# Patient Record
Sex: Male | Born: 1945 | Race: White | Hispanic: No | Marital: Married | Smoking: Former smoker
Health system: Southern US, Community
[De-identification: ages and names within clinical notes are randomized; demographics above are authoritative.]

## PROBLEM LIST (undated history)

## (undated) DIAGNOSIS — I10 Essential (primary) hypertension: Secondary | ICD-10-CM

## (undated) DIAGNOSIS — Z95828 Presence of other vascular implants and grafts: Secondary | ICD-10-CM

## (undated) DIAGNOSIS — G629 Polyneuropathy, unspecified: Secondary | ICD-10-CM

## (undated) DIAGNOSIS — E119 Type 2 diabetes mellitus without complications: Secondary | ICD-10-CM

## (undated) DIAGNOSIS — I712 Thoracic aortic aneurysm, without rupture, unspecified: Secondary | ICD-10-CM

## (undated) DIAGNOSIS — N182 Chronic kidney disease, stage 2 (mild): Secondary | ICD-10-CM

## (undated) DIAGNOSIS — C801 Malignant (primary) neoplasm, unspecified: Secondary | ICD-10-CM

## (undated) DIAGNOSIS — E782 Mixed hyperlipidemia: Secondary | ICD-10-CM

## (undated) DIAGNOSIS — I4891 Unspecified atrial fibrillation: Secondary | ICD-10-CM

## (undated) DIAGNOSIS — I482 Chronic atrial fibrillation, unspecified: Secondary | ICD-10-CM

## (undated) DIAGNOSIS — C189 Malignant neoplasm of colon, unspecified: Secondary | ICD-10-CM

## (undated) DIAGNOSIS — Z7901 Long term (current) use of anticoagulants: Secondary | ICD-10-CM

## (undated) DIAGNOSIS — C649 Malignant neoplasm of unspecified kidney, except renal pelvis: Secondary | ICD-10-CM

## (undated) HISTORY — DX: Chronic atrial fibrillation, unspecified: I48.20

## (undated) HISTORY — DX: Mixed hyperlipidemia: E78.2

## (undated) HISTORY — DX: Presence of other vascular implants and grafts: Z95.828

## (undated) HISTORY — PX: NEPHRECTOMY: SHX65

## (undated) HISTORY — DX: Unspecified atrial fibrillation: I48.91

## (undated) HISTORY — PX: COLOSTOMY: SHX63

## (undated) HISTORY — DX: Chronic kidney disease, stage 2 (mild): N18.2

## (undated) HISTORY — DX: Essential (primary) hypertension: I10

## (undated) HISTORY — DX: Malignant neoplasm of colon, unspecified: C18.9

## (undated) HISTORY — DX: Polyneuropathy, unspecified: G62.9

## (undated) HISTORY — DX: Malignant neoplasm of unspecified kidney, except renal pelvis: C64.9

## (undated) HISTORY — DX: Type 2 diabetes mellitus without complications: E11.9

## (undated) HISTORY — PX: COLECTOMY: SHX59

---

## 2004-10-17 ENCOUNTER — Ambulatory Visit (HOSPITAL_COMMUNITY): Admission: RE | Admit: 2004-10-17 | Discharge: 2004-10-17 | Payer: Self-pay | Admitting: Family Medicine

## 2008-09-30 DIAGNOSIS — C649 Malignant neoplasm of unspecified kidney, except renal pelvis: Secondary | ICD-10-CM

## 2008-09-30 DIAGNOSIS — C189 Malignant neoplasm of colon, unspecified: Secondary | ICD-10-CM

## 2008-09-30 HISTORY — DX: Malignant neoplasm of unspecified kidney, except renal pelvis: C64.9

## 2008-09-30 HISTORY — DX: Malignant neoplasm of colon, unspecified: C18.9

## 2009-06-26 ENCOUNTER — Encounter (INDEPENDENT_AMBULATORY_CARE_PROVIDER_SITE_OTHER): Payer: Self-pay | Admitting: General Surgery

## 2009-06-26 ENCOUNTER — Inpatient Hospital Stay (HOSPITAL_COMMUNITY): Admission: RE | Admit: 2009-06-26 | Discharge: 2009-07-06 | Payer: Self-pay | Admitting: General Surgery

## 2009-06-26 ENCOUNTER — Encounter (INDEPENDENT_AMBULATORY_CARE_PROVIDER_SITE_OTHER): Payer: Self-pay | Admitting: Cardiovascular Disease

## 2009-06-28 ENCOUNTER — Encounter (INDEPENDENT_AMBULATORY_CARE_PROVIDER_SITE_OTHER): Payer: Self-pay | Admitting: Urology

## 2009-08-07 ENCOUNTER — Inpatient Hospital Stay (HOSPITAL_COMMUNITY): Admission: AD | Admit: 2009-08-07 | Discharge: 2009-08-11 | Payer: Self-pay | Admitting: Family Medicine

## 2009-08-07 ENCOUNTER — Ambulatory Visit (HOSPITAL_COMMUNITY): Admission: RE | Admit: 2009-08-07 | Discharge: 2009-08-07 | Payer: Self-pay | Admitting: Family Medicine

## 2009-08-09 ENCOUNTER — Encounter: Payer: Self-pay | Admitting: General Surgery

## 2009-09-11 ENCOUNTER — Ambulatory Visit (HOSPITAL_COMMUNITY): Admission: RE | Admit: 2009-09-11 | Discharge: 2009-09-11 | Payer: Self-pay | Admitting: Family Medicine

## 2009-10-03 ENCOUNTER — Encounter (HOSPITAL_COMMUNITY): Admission: RE | Admit: 2009-10-03 | Discharge: 2009-11-02 | Payer: Self-pay | Admitting: Oncology

## 2009-10-03 ENCOUNTER — Ambulatory Visit (HOSPITAL_COMMUNITY): Payer: Self-pay | Admitting: Oncology

## 2009-10-13 ENCOUNTER — Ambulatory Visit (HOSPITAL_COMMUNITY): Admission: RE | Admit: 2009-10-13 | Discharge: 2009-10-13 | Payer: Self-pay | Admitting: General Surgery

## 2009-11-08 ENCOUNTER — Encounter (HOSPITAL_COMMUNITY): Admission: RE | Admit: 2009-11-08 | Discharge: 2009-12-08 | Payer: Self-pay | Admitting: Oncology

## 2009-11-11 ENCOUNTER — Inpatient Hospital Stay (HOSPITAL_COMMUNITY): Admission: EM | Admit: 2009-11-11 | Discharge: 2009-11-13 | Payer: Self-pay | Admitting: Emergency Medicine

## 2009-11-22 ENCOUNTER — Ambulatory Visit (HOSPITAL_COMMUNITY): Payer: Self-pay | Admitting: Oncology

## 2009-12-12 ENCOUNTER — Encounter (HOSPITAL_COMMUNITY): Admission: RE | Admit: 2009-12-12 | Discharge: 2010-01-11 | Payer: Self-pay | Admitting: Oncology

## 2010-01-09 ENCOUNTER — Ambulatory Visit (HOSPITAL_COMMUNITY): Payer: Self-pay | Admitting: Oncology

## 2010-01-17 ENCOUNTER — Encounter (HOSPITAL_COMMUNITY): Admission: RE | Admit: 2010-01-17 | Discharge: 2010-02-16 | Payer: Self-pay | Admitting: Oncology

## 2010-02-20 ENCOUNTER — Encounter (HOSPITAL_COMMUNITY): Admission: RE | Admit: 2010-02-20 | Discharge: 2010-03-22 | Payer: Self-pay | Admitting: Oncology

## 2010-02-27 ENCOUNTER — Ambulatory Visit (HOSPITAL_COMMUNITY): Payer: Self-pay | Admitting: Oncology

## 2010-04-10 ENCOUNTER — Encounter (HOSPITAL_COMMUNITY): Admission: RE | Admit: 2010-04-10 | Discharge: 2010-05-10 | Payer: Self-pay | Admitting: Oncology

## 2010-05-03 ENCOUNTER — Ambulatory Visit (HOSPITAL_COMMUNITY): Payer: Self-pay | Admitting: Oncology

## 2010-06-15 ENCOUNTER — Encounter (HOSPITAL_COMMUNITY): Admission: RE | Admit: 2010-06-15 | Discharge: 2010-06-29 | Payer: Self-pay | Admitting: Oncology

## 2010-07-03 ENCOUNTER — Ambulatory Visit (HOSPITAL_COMMUNITY): Payer: Self-pay | Admitting: Oncology

## 2010-08-13 ENCOUNTER — Ambulatory Visit (HOSPITAL_COMMUNITY)
Admission: RE | Admit: 2010-08-13 | Discharge: 2010-08-13 | Payer: Self-pay | Source: Home / Self Care | Admitting: General Surgery

## 2010-09-07 ENCOUNTER — Encounter (HOSPITAL_COMMUNITY)
Admission: RE | Admit: 2010-09-07 | Discharge: 2010-10-07 | Payer: Self-pay | Source: Home / Self Care | Attending: Oncology | Admitting: Oncology

## 2010-09-07 ENCOUNTER — Ambulatory Visit (HOSPITAL_COMMUNITY): Payer: Self-pay | Admitting: Oncology

## 2010-10-18 ENCOUNTER — Encounter (HOSPITAL_COMMUNITY)
Admission: RE | Admit: 2010-10-18 | Discharge: 2010-10-30 | Payer: Self-pay | Source: Home / Self Care | Attending: Oncology | Admitting: Oncology

## 2010-10-22 LAB — PROTIME-INR: Prothrombin Time: 17.8 seconds — ABNORMAL HIGH (ref 11.6–15.2)

## 2010-11-29 ENCOUNTER — Encounter (HOSPITAL_COMMUNITY): Payer: BC Managed Care – PPO | Attending: Oncology

## 2010-11-29 ENCOUNTER — Other Ambulatory Visit (HOSPITAL_COMMUNITY): Payer: BC Managed Care – PPO

## 2010-11-29 DIAGNOSIS — C189 Malignant neoplasm of colon, unspecified: Secondary | ICD-10-CM

## 2010-11-29 DIAGNOSIS — Z79899 Other long term (current) drug therapy: Secondary | ICD-10-CM | POA: Insufficient documentation

## 2010-11-29 DIAGNOSIS — E119 Type 2 diabetes mellitus without complications: Secondary | ICD-10-CM | POA: Insufficient documentation

## 2010-11-29 DIAGNOSIS — Z85038 Personal history of other malignant neoplasm of large intestine: Secondary | ICD-10-CM | POA: Insufficient documentation

## 2010-11-29 DIAGNOSIS — I4891 Unspecified atrial fibrillation: Secondary | ICD-10-CM | POA: Insufficient documentation

## 2010-11-29 DIAGNOSIS — Z09 Encounter for follow-up examination after completed treatment for conditions other than malignant neoplasm: Secondary | ICD-10-CM | POA: Insufficient documentation

## 2010-11-29 DIAGNOSIS — Z85528 Personal history of other malignant neoplasm of kidney: Secondary | ICD-10-CM | POA: Insufficient documentation

## 2010-12-11 LAB — GLUCOSE, CAPILLARY: Glucose-Capillary: 132 mg/dL — ABNORMAL HIGH (ref 70–99)

## 2010-12-13 LAB — DIFFERENTIAL
Basophils Relative: 1 % (ref 0–1)
Lymphocytes Relative: 18 % (ref 12–46)
Lymphs Abs: 1.1 10*3/uL (ref 0.7–4.0)
Monocytes Relative: 13 % — ABNORMAL HIGH (ref 3–12)
Neutro Abs: 4.2 10*3/uL (ref 1.7–7.7)
Neutrophils Relative %: 67 % (ref 43–77)

## 2010-12-13 LAB — CBC
HCT: 37.3 % — ABNORMAL LOW (ref 39.0–52.0)
MCH: 32.7 pg (ref 26.0–34.0)
Platelets: 200 10*3/uL (ref 150–400)
WBC: 6.3 10*3/uL (ref 4.0–10.5)

## 2010-12-13 LAB — COMPREHENSIVE METABOLIC PANEL
Albumin: 3.8 g/dL (ref 3.5–5.2)
Alkaline Phosphatase: 61 U/L (ref 39–117)
CO2: 28 mEq/L (ref 19–32)
Calcium: 9.4 mg/dL (ref 8.4–10.5)
Chloride: 105 mEq/L (ref 96–112)
Creatinine, Ser: 1.38 mg/dL (ref 0.4–1.5)
GFR calc non Af Amer: 52 mL/min — ABNORMAL LOW (ref >60)
Glucose, Bld: 101 mg/dL — ABNORMAL HIGH (ref 70–99)

## 2010-12-14 LAB — PROTIME-INR
INR: 1.78 — ABNORMAL HIGH (ref 0.00–1.49)
Prothrombin Time: 20.9 seconds — ABNORMAL HIGH (ref 11.6–15.2)

## 2010-12-16 LAB — BASIC METABOLIC PANEL
CO2: 25 mEq/L (ref 19–32)
Calcium: 9.1 mg/dL (ref 8.4–10.5)
Creatinine, Ser: 1.52 mg/dL — ABNORMAL HIGH (ref 0.4–1.5)
GFR calc Af Amer: 57 mL/min — ABNORMAL LOW (ref >60)
GFR calc non Af Amer: 47 mL/min — ABNORMAL LOW (ref >60)

## 2010-12-16 LAB — DIFFERENTIAL
Basophils Absolute: 0 10*3/uL (ref 0.0–0.1)
Basophils Relative: 1 % (ref 0–1)
Eosinophils Absolute: 0.1 10*3/uL (ref 0.0–0.7)
Eosinophils Absolute: 0.1 10*3/uL (ref 0.0–0.7)
Eosinophils Absolute: 0 10*3/uL (ref 0.0–0.7)
Eosinophils Relative: 1 % (ref 0–5)
Lymphocytes Relative: 13 % (ref 12–46)
Lymphocytes Relative: 20 % (ref 12–46)
Lymphs Abs: 0.8 10*3/uL (ref 0.7–4.0)
Lymphs Abs: 0.9 10*3/uL (ref 0.7–4.0)
Monocytes Absolute: 0.8 10*3/uL (ref 0.1–1.0)
Monocytes Absolute: 1.1 10*3/uL — ABNORMAL HIGH (ref 0.1–1.0)
Monocytes Relative: 24 % — ABNORMAL HIGH (ref 3–12)
Neutro Abs: 3.7 10*3/uL (ref 1.7–7.7)
Neutrophils Relative %: 71 % (ref 43–77)

## 2010-12-16 LAB — CBC
HCT: 31.3 % — ABNORMAL LOW (ref 39.0–52.0)
Hemoglobin: 10.4 g/dL — ABNORMAL LOW (ref 13.0–17.0)
Hemoglobin: 11.1 g/dL — ABNORMAL LOW (ref 13.0–17.0)
Hemoglobin: 12.1 g/dL — ABNORMAL LOW (ref 13.0–17.0)
MCHC: 32.4 g/dL (ref 30.0–36.0)
MCHC: 33 g/dL (ref 30.0–36.0)
MCHC: 33.3 g/dL (ref 30.0–36.0)
Platelets: 112 10*3/uL — ABNORMAL LOW (ref 150–400)
RBC: 3.85 MIL/uL — ABNORMAL LOW (ref 4.22–5.81)
RDW: 19.4 % — ABNORMAL HIGH (ref 11.5–15.5)
RDW: 18.4 % — ABNORMAL HIGH (ref 11.5–15.5)
RDW: 19.5 % — ABNORMAL HIGH (ref 11.5–15.5)
RDW: 20 % — ABNORMAL HIGH (ref 11.5–15.5)

## 2010-12-16 LAB — COMPREHENSIVE METABOLIC PANEL
ALT: 24 U/L (ref 0–53)
AST: 20 U/L (ref 0–37)
Albumin: 3.7 g/dL (ref 3.5–5.2)
CO2: 26 mEq/L (ref 19–32)
CO2: 27 mEq/L (ref 19–32)
Calcium: 8.9 mg/dL (ref 8.4–10.5)
Calcium: 9.4 mg/dL (ref 8.4–10.5)
Creatinine, Ser: 1.49 mg/dL (ref 0.4–1.5)
GFR calc Af Amer: 53 mL/min — ABNORMAL LOW (ref >60)
GFR calc non Af Amer: 48 mL/min — ABNORMAL LOW (ref >60)
Glucose, Bld: 178 mg/dL — ABNORMAL HIGH (ref 70–99)
Sodium: 137 mEq/L (ref 135–145)
Total Protein: 6.6 g/dL (ref 6.0–8.3)

## 2010-12-16 LAB — PROTIME-INR
INR: 1.17 (ref 0.00–1.49)
INR: 1.65 — ABNORMAL HIGH (ref 0.00–1.49)
INR: 1.32 (ref 0.00–1.49)
INR: 1.87 — ABNORMAL HIGH (ref 0.00–1.49)
Prothrombin Time: 15.6 seconds — ABNORMAL HIGH (ref 11.6–15.2)
Prothrombin Time: 19.4 seconds — ABNORMAL HIGH (ref 11.6–15.2)
Prothrombin Time: 16.3 seconds — ABNORMAL HIGH (ref 11.6–15.2)
Prothrombin Time: 28.9 seconds — ABNORMAL HIGH (ref 11.6–15.2)

## 2010-12-16 LAB — CEA: CEA: 2.6 ng/mL (ref 0.0–5.0)

## 2010-12-17 LAB — CBC
HCT: 34.1 % — ABNORMAL LOW (ref 39.0–52.0)
HCT: 34.8 % — ABNORMAL LOW (ref 39.0–52.0)
Hemoglobin: 11.6 g/dL — ABNORMAL LOW (ref 13.0–17.0)
MCHC: 34 g/dL (ref 30.0–36.0)
Platelets: 135 10*3/uL — ABNORMAL LOW (ref 150–400)
RBC: 3.35 MIL/uL — ABNORMAL LOW (ref 4.22–5.81)
RDW: 21.7 % — ABNORMAL HIGH (ref 11.5–15.5)
RDW: 25.2 % — ABNORMAL HIGH (ref 11.5–15.5)
WBC: 4 10*3/uL (ref 4.0–10.5)

## 2010-12-17 LAB — COMPREHENSIVE METABOLIC PANEL
ALT: 19 U/L (ref 0–53)
ALT: 22 U/L (ref 0–53)
AST: 16 U/L (ref 0–37)
Albumin: 3.4 g/dL — ABNORMAL LOW (ref 3.5–5.2)
Alkaline Phosphatase: 73 U/L (ref 39–117)
Alkaline Phosphatase: 80 U/L (ref 39–117)
BUN: 14 mg/dL (ref 6–23)
CO2: 27 mEq/L (ref 19–32)
Calcium: 9.4 mg/dL (ref 8.4–10.5)
Chloride: 102 mEq/L (ref 96–112)
GFR calc Af Amer: 53 mL/min — ABNORMAL LOW (ref >60)
GFR calc non Af Amer: 44 mL/min — ABNORMAL LOW (ref >60)
Glucose, Bld: 215 mg/dL — ABNORMAL HIGH (ref 70–99)
Potassium: 4.7 mEq/L (ref 3.5–5.1)
Sodium: 136 mEq/L (ref 135–145)
Total Bilirubin: 0.7 mg/dL (ref 0.3–1.2)
Total Protein: 6.1 g/dL (ref 6.0–8.3)
Total Protein: 6.3 g/dL (ref 6.0–8.3)

## 2010-12-17 LAB — LIPID PANEL
Cholesterol: 142 mg/dL (ref 0–200)
HDL: 43 mg/dL (ref >39)
LDL Cholesterol: 58 mg/dL (ref 0–99)
Total CHOL/HDL Ratio: 3.3 RATIO

## 2010-12-17 LAB — DIFFERENTIAL
Basophils Absolute: 0 10*3/uL (ref 0.0–0.1)
Basophils Relative: 1 % (ref 0–1)
Basophils Relative: 1 % (ref 0–1)
Eosinophils Absolute: 0 10*3/uL (ref 0.0–0.7)
Eosinophils Relative: 1 % (ref 0–5)
Eosinophils Relative: 1 % (ref 0–5)
Lymphocytes Relative: 25 % (ref 12–46)
Monocytes Absolute: 0.9 10*3/uL (ref 0.1–1.0)
Neutro Abs: 1.9 10*3/uL (ref 1.7–7.7)

## 2010-12-17 LAB — PROTIME-INR: INR: 1.95 — ABNORMAL HIGH (ref 0.00–1.49)

## 2010-12-17 LAB — HEMOGLOBIN A1C: Hgb A1c MFr Bld: 8.3 % — ABNORMAL HIGH (ref <5.7)

## 2010-12-18 LAB — COMPREHENSIVE METABOLIC PANEL
ALT: 20 U/L (ref 0–53)
ALT: 23 U/L (ref 0–53)
Alkaline Phosphatase: 86 U/L (ref 39–117)
BUN: 23 mg/dL (ref 6–23)
CO2: 28 mEq/L (ref 19–32)
CO2: 28 mEq/L (ref 19–32)
Calcium: 9 mg/dL (ref 8.4–10.5)
Chloride: 102 mEq/L (ref 96–112)
Creatinine, Ser: 1.55 mg/dL — ABNORMAL HIGH (ref 0.4–1.5)
GFR calc Af Amer: 55 mL/min — ABNORMAL LOW (ref >60)
GFR calc non Af Amer: 46 mL/min — ABNORMAL LOW (ref >60)
Glucose, Bld: 193 mg/dL — ABNORMAL HIGH (ref 70–99)
Glucose, Bld: 149 mg/dL — ABNORMAL HIGH (ref 70–99)
Potassium: 5.2 mEq/L — ABNORMAL HIGH (ref 3.5–5.1)
Sodium: 137 mEq/L (ref 135–145)
Sodium: 138 mEq/L (ref 135–145)
Total Bilirubin: 0.5 mg/dL (ref 0.3–1.2)
Total Protein: 6.4 g/dL (ref 6.0–8.3)
Total Protein: 6.8 g/dL (ref 6.0–8.3)

## 2010-12-18 LAB — CBC
HCT: 36.4 % — ABNORMAL LOW (ref 39.0–52.0)
Hemoglobin: 11.9 g/dL — ABNORMAL LOW (ref 13.0–17.0)
Hemoglobin: 12.6 g/dL — ABNORMAL LOW (ref 13.0–17.0)
MCHC: 35.2 g/dL (ref 30.0–36.0)
MCV: 93 fL (ref 78.0–100.0)
RBC: 3.62 MIL/uL — ABNORMAL LOW (ref 4.22–5.81)
RBC: 4.11 MIL/uL — ABNORMAL LOW (ref 4.22–5.81)
RDW: 26.2 % — ABNORMAL HIGH (ref 11.5–15.5)
RDW: 23.1 % — ABNORMAL HIGH (ref 11.5–15.5)
WBC: 6 10*3/uL (ref 4.0–10.5)

## 2010-12-18 LAB — PROTIME-INR
INR: 1.53 — ABNORMAL HIGH (ref 0.00–1.49)
INR: 2.15 — ABNORMAL HIGH (ref 0.00–1.49)
Prothrombin Time: 18.3 seconds — ABNORMAL HIGH (ref 11.6–15.2)

## 2010-12-18 LAB — DIFFERENTIAL
Basophils Absolute: 0 10*3/uL (ref 0.0–0.1)
Basophils Relative: 1 % (ref 0–1)
Eosinophils Absolute: 0.1 10*3/uL (ref 0.0–0.7)
Lymphocytes Relative: 21 % (ref 12–46)
Lymphs Abs: 1 10*3/uL (ref 0.7–4.0)
Monocytes Relative: 21 % — ABNORMAL HIGH (ref 3–12)
Monocytes Relative: 16 % — ABNORMAL HIGH (ref 3–12)
Neutrophils Relative %: 56 % (ref 43–77)
Neutrophils Relative %: 57 % (ref 43–77)

## 2010-12-19 LAB — CBC
HCT: 38 % — ABNORMAL LOW (ref 39.0–52.0)
Hemoglobin: 11.9 g/dL — ABNORMAL LOW (ref 13.0–17.0)
Hemoglobin: 12.6 g/dL — ABNORMAL LOW (ref 13.0–17.0)
MCHC: 34.9 g/dL (ref 30.0–36.0)
MCHC: 33.9 g/dL (ref 30.0–36.0)
MCV: 86.5 fL (ref 78.0–100.0)
MCV: 83 fL (ref 78.0–100.0)
Platelets: 155 10*3/uL (ref 150–400)
Platelets: 252 10*3/uL (ref 150–400)
RBC: 3.95 MIL/uL — ABNORMAL LOW (ref 4.22–5.81)
RDW: 22 % — ABNORMAL HIGH (ref 11.5–15.5)
RDW: 17.4 % — ABNORMAL HIGH (ref 11.5–15.5)
RDW: 18.3 % — ABNORMAL HIGH (ref 11.5–15.5)
WBC: 4.9 10*3/uL (ref 4.0–10.5)
WBC: 8.2 10*3/uL (ref 4.0–10.5)

## 2010-12-19 LAB — DIFFERENTIAL
Basophils Absolute: 0 10*3/uL (ref 0.0–0.1)
Basophils Absolute: 0 10*3/uL (ref 0.0–0.1)
Basophils Relative: 0 % (ref 0–1)
Eosinophils Absolute: 0.1 10*3/uL (ref 0.0–0.7)
Eosinophils Relative: 2 % (ref 0–5)
Lymphocytes Relative: 19 % (ref 12–46)
Lymphocytes Relative: 11 % — ABNORMAL LOW (ref 12–46)
Lymphocytes Relative: 18 % (ref 12–46)
Lymphs Abs: 0.9 10*3/uL (ref 0.7–4.0)
Monocytes Absolute: 0.6 10*3/uL (ref 0.1–1.0)
Monocytes Absolute: 0.9 10*3/uL (ref 0.1–1.0)
Monocytes Relative: 6 % (ref 3–12)
Neutro Abs: 3.2 10*3/uL (ref 1.7–7.7)
Neutro Abs: 3 10*3/uL (ref 1.7–7.7)
Neutro Abs: 5.6 10*3/uL (ref 1.7–7.7)
Neutro Abs: 8.7 10*3/uL — ABNORMAL HIGH (ref 1.7–7.7)
Neutrophils Relative %: 68 % (ref 43–77)

## 2010-12-19 LAB — COMPREHENSIVE METABOLIC PANEL
AST: 11 U/L (ref 0–37)
Albumin: 3.2 g/dL — ABNORMAL LOW (ref 3.5–5.2)
Albumin: 3.5 g/dL (ref 3.5–5.2)
Albumin: 4 g/dL (ref 3.5–5.2)
Alkaline Phosphatase: 59 U/L (ref 39–117)
Alkaline Phosphatase: 70 U/L (ref 39–117)
BUN: 20 mg/dL (ref 6–23)
BUN: 24 mg/dL — ABNORMAL HIGH (ref 6–23)
BUN: 27 mg/dL — ABNORMAL HIGH (ref 6–23)
CO2: 28 mEq/L (ref 19–32)
Calcium: 9.5 mg/dL (ref 8.4–10.5)
Chloride: 104 mEq/L (ref 96–112)
Chloride: 102 mEq/L (ref 96–112)
Creatinine, Ser: 1.75 mg/dL — ABNORMAL HIGH (ref 0.4–1.5)
Creatinine, Ser: 1.71 mg/dL — ABNORMAL HIGH (ref 0.4–1.5)
GFR calc Af Amer: 49 mL/min — ABNORMAL LOW (ref >60)
GFR calc non Af Amer: 40 mL/min — ABNORMAL LOW (ref >60)
Glucose, Bld: 135 mg/dL — ABNORMAL HIGH (ref 70–99)
Potassium: 4.4 mEq/L (ref 3.5–5.1)
Potassium: 4.9 mEq/L (ref 3.5–5.1)
Sodium: 136 mEq/L (ref 135–145)
Total Bilirubin: 0.6 mg/dL (ref 0.3–1.2)
Total Bilirubin: 0.4 mg/dL (ref 0.3–1.2)
Total Protein: 7 g/dL (ref 6.0–8.3)
Total Protein: 7.6 g/dL (ref 6.0–8.3)

## 2010-12-19 LAB — GLUCOSE, CAPILLARY
Glucose-Capillary: 105 mg/dL — ABNORMAL HIGH (ref 70–99)
Glucose-Capillary: 142 mg/dL — ABNORMAL HIGH (ref 70–99)

## 2010-12-19 LAB — CEA: CEA: 2.7 ng/mL (ref 0.0–5.0)

## 2010-12-19 LAB — PROTIME-INR
INR: 2.35 — ABNORMAL HIGH (ref 0.00–1.49)
INR: 2.54 — ABNORMAL HIGH (ref 0.00–1.49)
INR: 3.8 — ABNORMAL HIGH (ref 0.00–1.49)
Prothrombin Time: 25.5 seconds — ABNORMAL HIGH (ref 11.6–15.2)
Prothrombin Time: 27.1 seconds — ABNORMAL HIGH (ref 11.6–15.2)
Prothrombin Time: 28.9 seconds — ABNORMAL HIGH (ref 11.6–15.2)
Prothrombin Time: 37.2 seconds — ABNORMAL HIGH (ref 11.6–15.2)

## 2010-12-19 LAB — URINALYSIS, ROUTINE W REFLEX MICROSCOPIC
Glucose, UA: 250 mg/dL — AB
Ketones, ur: 15 mg/dL — AB
Protein, ur: 100 mg/dL — AB

## 2010-12-19 LAB — LIPASE, BLOOD: Lipase: 28 U/L (ref 11–59)

## 2010-12-24 LAB — COMPREHENSIVE METABOLIC PANEL
ALT: 17 U/L (ref 0–53)
AST: 13 U/L (ref 0–37)
Alkaline Phosphatase: 76 U/L (ref 39–117)
CO2: 29 mEq/L (ref 19–32)
Calcium: 9 mg/dL (ref 8.4–10.5)
GFR calc Af Amer: 52 mL/min — ABNORMAL LOW (ref >60)
Glucose, Bld: 133 mg/dL — ABNORMAL HIGH (ref 70–99)
Potassium: 5.2 mEq/L — ABNORMAL HIGH (ref 3.5–5.1)
Sodium: 140 mEq/L (ref 135–145)
Total Protein: 6.2 g/dL (ref 6.0–8.3)

## 2010-12-24 LAB — DIFFERENTIAL
Basophils Relative: 0 % (ref 0–1)
Basophils Relative: 1 % (ref 0–1)
Eosinophils Absolute: 0.1 10*3/uL (ref 0.0–0.7)
Eosinophils Absolute: 0.1 10*3/uL (ref 0.0–0.7)
Eosinophils Relative: 2 % (ref 0–5)
Lymphs Abs: 1 10*3/uL (ref 0.7–4.0)
Monocytes Absolute: 1.1 10*3/uL — ABNORMAL HIGH (ref 0.1–1.0)
Monocytes Relative: 19 % — ABNORMAL HIGH (ref 3–12)
Monocytes Relative: 23 % — ABNORMAL HIGH (ref 3–12)
Neutrophils Relative %: 53 % (ref 43–77)
Neutrophils Relative %: 58 % (ref 43–77)

## 2010-12-24 LAB — PROTIME-INR
INR: 2.99 — ABNORMAL HIGH (ref 0.00–1.49)
INR: 3.12 — ABNORMAL HIGH (ref 0.00–1.49)
INR: 5.13 (ref 0.00–1.49)
Prothrombin Time: 30.8 seconds — ABNORMAL HIGH (ref 11.6–15.2)
Prothrombin Time: 48.7 seconds — ABNORMAL HIGH (ref 11.6–15.2)

## 2010-12-24 LAB — CBC
HCT: 35.1 % — ABNORMAL LOW (ref 39.0–52.0)
Hemoglobin: 12 g/dL — ABNORMAL LOW (ref 13.0–17.0)
Hemoglobin: 12.6 g/dL — ABNORMAL LOW (ref 13.0–17.0)
MCHC: 34.6 g/dL (ref 30.0–36.0)
MCV: 84.5 fL (ref 78.0–100.0)
Platelets: 133 10*3/uL — ABNORMAL LOW (ref 150–400)
RBC: 4.32 MIL/uL (ref 4.22–5.81)
WBC: 4.3 10*3/uL (ref 4.0–10.5)

## 2011-01-02 LAB — GLUCOSE, CAPILLARY
Glucose-Capillary: 126 mg/dL — ABNORMAL HIGH (ref 70–99)
Glucose-Capillary: 129 mg/dL — ABNORMAL HIGH (ref 70–99)
Glucose-Capillary: 130 mg/dL — ABNORMAL HIGH (ref 70–99)
Glucose-Capillary: 135 mg/dL — ABNORMAL HIGH (ref 70–99)
Glucose-Capillary: 142 mg/dL — ABNORMAL HIGH (ref 70–99)
Glucose-Capillary: 150 mg/dL — ABNORMAL HIGH (ref 70–99)
Glucose-Capillary: 151 mg/dL — ABNORMAL HIGH (ref 70–99)
Glucose-Capillary: 153 mg/dL — ABNORMAL HIGH (ref 70–99)
Glucose-Capillary: 158 mg/dL — ABNORMAL HIGH (ref 70–99)
Glucose-Capillary: 169 mg/dL — ABNORMAL HIGH (ref 70–99)

## 2011-01-02 LAB — CROSSMATCH

## 2011-01-02 LAB — COMPREHENSIVE METABOLIC PANEL
CO2: 27 mEq/L (ref 19–32)
Calcium: 9.2 mg/dL (ref 8.4–10.5)
Creatinine, Ser: 1.63 mg/dL — ABNORMAL HIGH (ref 0.4–1.5)
GFR calc non Af Amer: 43 mL/min — ABNORMAL LOW (ref >60)
Glucose, Bld: 148 mg/dL — ABNORMAL HIGH (ref 70–99)

## 2011-01-02 LAB — BASIC METABOLIC PANEL
BUN: 15 mg/dL (ref 6–23)
CO2: 29 mEq/L (ref 19–32)
CO2: 33 mEq/L — ABNORMAL HIGH (ref 19–32)
Calcium: 9 mg/dL (ref 8.4–10.5)
Calcium: 9.2 mg/dL (ref 8.4–10.5)
Chloride: 105 mEq/L (ref 96–112)
Creatinine, Ser: 1.47 mg/dL (ref 0.4–1.5)
Creatinine, Ser: 1.58 mg/dL — ABNORMAL HIGH (ref 0.4–1.5)
GFR calc Af Amer: 54 mL/min — ABNORMAL LOW (ref >60)
GFR calc non Af Amer: 45 mL/min — ABNORMAL LOW (ref >60)

## 2011-01-02 LAB — CBC
HCT: 27.9 % — ABNORMAL LOW (ref 39.0–52.0)
Hemoglobin: 9.3 g/dL — ABNORMAL LOW (ref 13.0–17.0)
MCHC: 33.2 g/dL (ref 30.0–36.0)
MCHC: 33.2 g/dL (ref 30.0–36.0)
MCHC: 33.4 g/dL (ref 30.0–36.0)
MCV: 83.1 fL (ref 78.0–100.0)
MCV: 84.3 fL (ref 78.0–100.0)
Platelets: 577 10*3/uL — ABNORMAL HIGH (ref 150–400)
RBC: 3.11 MIL/uL — ABNORMAL LOW (ref 4.22–5.81)
RBC: 3.36 MIL/uL — ABNORMAL LOW (ref 4.22–5.81)
RDW: 15.8 % — ABNORMAL HIGH (ref 11.5–15.5)
WBC: 11.5 10*3/uL — ABNORMAL HIGH (ref 4.0–10.5)

## 2011-01-02 LAB — PREPARE FRESH FROZEN PLASMA

## 2011-01-02 LAB — DIFFERENTIAL
Basophils Absolute: 0 10*3/uL (ref 0.0–0.1)
Basophils Absolute: 0.1 10*3/uL (ref 0.0–0.1)
Basophils Absolute: 0.1 10*3/uL (ref 0.0–0.1)
Basophils Relative: 1 % (ref 0–1)
Eosinophils Absolute: 0 10*3/uL (ref 0.0–0.7)
Eosinophils Absolute: 0.2 10*3/uL (ref 0.0–0.7)
Eosinophils Absolute: 0.2 10*3/uL (ref 0.0–0.7)
Eosinophils Relative: 1 % (ref 0–5)
Lymphocytes Relative: 6 % — ABNORMAL LOW (ref 12–46)
Lymphocytes Relative: 8 % — ABNORMAL LOW (ref 12–46)
Lymphs Abs: 0.8 10*3/uL (ref 0.7–4.0)
Lymphs Abs: 1 10*3/uL (ref 0.7–4.0)
Monocytes Absolute: 1.5 10*3/uL — ABNORMAL HIGH (ref 0.1–1.0)
Monocytes Relative: 13 % — ABNORMAL HIGH (ref 3–12)
Neutrophils Relative %: 73 % (ref 43–77)
Neutrophils Relative %: 83 % — ABNORMAL HIGH (ref 43–77)

## 2011-01-02 LAB — PHOSPHORUS: Phosphorus: 4.2 mg/dL (ref 2.3–4.6)

## 2011-01-02 LAB — MAGNESIUM: Magnesium: 1.8 mg/dL (ref 1.5–2.5)

## 2011-01-02 LAB — D-DIMER, QUANTITATIVE: D-Dimer, Quant: 3.95 ug/mL-FEU — ABNORMAL HIGH (ref 0.00–0.48)

## 2011-01-02 LAB — SEDIMENTATION RATE: Sed Rate: 70 mm/hr — ABNORMAL HIGH (ref 0–16)

## 2011-01-02 LAB — CULTURE, ROUTINE-ABSCESS

## 2011-01-02 LAB — PROTIME-INR
INR: >10 (ref 0.00–1.49)
Prothrombin Time: 18.9 seconds — ABNORMAL HIGH (ref 11.6–15.2)

## 2011-01-03 LAB — GLUCOSE, CAPILLARY
Glucose-Capillary: 100 mg/dL — ABNORMAL HIGH (ref 70–99)
Glucose-Capillary: 105 mg/dL — ABNORMAL HIGH (ref 70–99)
Glucose-Capillary: 113 mg/dL — ABNORMAL HIGH (ref 70–99)
Glucose-Capillary: 114 mg/dL — ABNORMAL HIGH (ref 70–99)
Glucose-Capillary: 123 mg/dL — ABNORMAL HIGH (ref 70–99)
Glucose-Capillary: 125 mg/dL — ABNORMAL HIGH (ref 70–99)
Glucose-Capillary: 125 mg/dL — ABNORMAL HIGH (ref 70–99)
Glucose-Capillary: 130 mg/dL — ABNORMAL HIGH (ref 70–99)
Glucose-Capillary: 132 mg/dL — ABNORMAL HIGH (ref 70–99)
Glucose-Capillary: 132 mg/dL — ABNORMAL HIGH (ref 70–99)
Glucose-Capillary: 132 mg/dL — ABNORMAL HIGH (ref 70–99)
Glucose-Capillary: 138 mg/dL — ABNORMAL HIGH (ref 70–99)
Glucose-Capillary: 143 mg/dL — ABNORMAL HIGH (ref 70–99)
Glucose-Capillary: 143 mg/dL — ABNORMAL HIGH (ref 70–99)
Glucose-Capillary: 146 mg/dL — ABNORMAL HIGH (ref 70–99)
Glucose-Capillary: 148 mg/dL — ABNORMAL HIGH (ref 70–99)
Glucose-Capillary: 148 mg/dL — ABNORMAL HIGH (ref 70–99)
Glucose-Capillary: 148 mg/dL — ABNORMAL HIGH (ref 70–99)
Glucose-Capillary: 94 mg/dL (ref 70–99)

## 2011-01-03 LAB — DIFFERENTIAL
Basophils Absolute: 0 10*3/uL (ref 0.0–0.1)
Basophils Absolute: 0 10*3/uL (ref 0.0–0.1)
Basophils Absolute: 0 10*3/uL (ref 0.0–0.1)
Basophils Relative: 0 % (ref 0–1)
Basophils Relative: 0 % (ref 0–1)
Basophils Relative: 0 % (ref 0–1)
Basophils Relative: 1 % (ref 0–1)
Eosinophils Absolute: 0 10*3/uL (ref 0.0–0.7)
Eosinophils Absolute: 0.2 10*3/uL (ref 0.0–0.7)
Eosinophils Absolute: 0.2 10*3/uL (ref 0.0–0.7)
Eosinophils Absolute: 0.3 10*3/uL (ref 0.0–0.7)
Eosinophils Relative: 0 % (ref 0–5)
Eosinophils Relative: 3 % (ref 0–5)
Lymphocytes Relative: 10 % — ABNORMAL LOW (ref 12–46)
Lymphocytes Relative: 15 % (ref 12–46)
Lymphocytes Relative: 9 % — ABNORMAL LOW (ref 12–46)
Lymphs Abs: 0.8 10*3/uL (ref 0.7–4.0)
Lymphs Abs: 0.9 10*3/uL (ref 0.7–4.0)
Monocytes Absolute: 0.8 10*3/uL (ref 0.1–1.0)
Monocytes Absolute: 1 10*3/uL (ref 0.1–1.0)
Monocytes Absolute: 1 10*3/uL (ref 0.1–1.0)
Monocytes Absolute: 1.1 10*3/uL — ABNORMAL HIGH (ref 0.1–1.0)
Monocytes Relative: 14 % — ABNORMAL HIGH (ref 3–12)
Monocytes Relative: 16 % — ABNORMAL HIGH (ref 3–12)
Monocytes Relative: 18 % — ABNORMAL HIGH (ref 3–12)
Neutro Abs: 3.9 10*3/uL (ref 1.7–7.7)
Neutro Abs: 4.4 10*3/uL (ref 1.7–7.7)
Neutro Abs: 5.5 10*3/uL (ref 1.7–7.7)
Neutrophils Relative %: 63 % (ref 43–77)

## 2011-01-03 LAB — CBC
HCT: 26 % — ABNORMAL LOW (ref 39.0–52.0)
HCT: 28 % — ABNORMAL LOW (ref 39.0–52.0)
Hemoglobin: 10.1 g/dL — ABNORMAL LOW (ref 13.0–17.0)
Hemoglobin: 8.7 g/dL — ABNORMAL LOW (ref 13.0–17.0)
Hemoglobin: 9.5 g/dL — ABNORMAL LOW (ref 13.0–17.0)
Hemoglobin: 9.6 g/dL — ABNORMAL LOW (ref 13.0–17.0)
MCHC: 33.9 g/dL (ref 30.0–36.0)
MCHC: 34.5 g/dL (ref 30.0–36.0)
MCHC: 34.7 g/dL (ref 30.0–36.0)
MCV: 87.9 fL (ref 78.0–100.0)
MCV: 88 fL (ref 78.0–100.0)
MCV: 88.5 fL (ref 78.0–100.0)
Platelets: 143 10*3/uL — ABNORMAL LOW (ref 150–400)
Platelets: 149 10*3/uL — ABNORMAL LOW (ref 150–400)
Platelets: 215 10*3/uL (ref 150–400)
RBC: 3.11 MIL/uL — ABNORMAL LOW (ref 4.22–5.81)
RBC: 3.26 MIL/uL — ABNORMAL LOW (ref 4.22–5.81)
RBC: 3.3 MIL/uL — ABNORMAL LOW (ref 4.22–5.81)
RDW: 13.4 % (ref 11.5–15.5)
RDW: 13.9 % (ref 11.5–15.5)
RDW: 14.1 % (ref 11.5–15.5)
WBC: 6 10*3/uL (ref 4.0–10.5)
WBC: 6.1 10*3/uL (ref 4.0–10.5)
WBC: 6.3 10*3/uL (ref 4.0–10.5)

## 2011-01-03 LAB — BASIC METABOLIC PANEL
BUN: 17 mg/dL (ref 6–23)
BUN: 19 mg/dL (ref 6–23)
CO2: 26 mEq/L (ref 19–32)
CO2: 27 mEq/L (ref 19–32)
Calcium: 8 mg/dL — ABNORMAL LOW (ref 8.4–10.5)
Calcium: 8.2 mg/dL — ABNORMAL LOW (ref 8.4–10.5)
Chloride: 108 mEq/L (ref 96–112)
Chloride: 109 mEq/L (ref 96–112)
Chloride: 109 mEq/L (ref 96–112)
Creatinine, Ser: 1.87 mg/dL — ABNORMAL HIGH (ref 0.4–1.5)
Creatinine, Ser: 2.05 mg/dL — ABNORMAL HIGH (ref 0.4–1.5)
GFR calc Af Amer: 44 mL/min — ABNORMAL LOW (ref >60)
GFR calc Af Amer: 45 mL/min — ABNORMAL LOW (ref >60)
GFR calc Af Amer: 49 mL/min — ABNORMAL LOW (ref >60)
GFR calc non Af Amer: 32 mL/min — ABNORMAL LOW (ref >60)
GFR calc non Af Amer: 36 mL/min — ABNORMAL LOW (ref >60)
GFR calc non Af Amer: 37 mL/min — ABNORMAL LOW (ref >60)
Glucose, Bld: 123 mg/dL — ABNORMAL HIGH (ref 70–99)
Glucose, Bld: 136 mg/dL — ABNORMAL HIGH (ref 70–99)
Potassium: 3.7 mEq/L (ref 3.5–5.1)
Potassium: 4.5 mEq/L (ref 3.5–5.1)
Sodium: 136 mEq/L (ref 135–145)
Sodium: 139 mEq/L (ref 135–145)

## 2011-01-03 LAB — CROSSMATCH: Antibody Screen: NEGATIVE

## 2011-01-03 LAB — PROTIME-INR
INR: 1.82 — ABNORMAL HIGH (ref 0.00–1.49)
Prothrombin Time: 17.9 seconds — ABNORMAL HIGH (ref 11.6–15.2)
Prothrombin Time: 20.9 seconds — ABNORMAL HIGH (ref 11.6–15.2)

## 2011-01-03 LAB — PREPARE RBC (CROSSMATCH)

## 2011-01-03 LAB — MAGNESIUM: Magnesium: 1.9 mg/dL (ref 1.5–2.5)

## 2011-01-03 LAB — APTT: aPTT: 32 seconds (ref 24–37)

## 2011-01-03 LAB — PHOSPHORUS: Phosphorus: 3.3 mg/dL (ref 2.3–4.6)

## 2011-01-04 LAB — CBC
HCT: 30.8 % — ABNORMAL LOW (ref 39.0–52.0)
MCHC: 34.6 g/dL (ref 30.0–36.0)
MCV: 88.6 fL (ref 78.0–100.0)
Platelets: 172 10*3/uL (ref 150–400)
RBC: 3.48 MIL/uL — ABNORMAL LOW (ref 4.22–5.81)
RBC: 4.67 MIL/uL (ref 4.22–5.81)
WBC: 6.6 10*3/uL (ref 4.0–10.5)
WBC: 8.6 10*3/uL (ref 4.0–10.5)

## 2011-01-04 LAB — GLUCOSE, CAPILLARY
Glucose-Capillary: 115 mg/dL — ABNORMAL HIGH (ref 70–99)
Glucose-Capillary: 117 mg/dL — ABNORMAL HIGH (ref 70–99)
Glucose-Capillary: 123 mg/dL — ABNORMAL HIGH (ref 70–99)
Glucose-Capillary: 123 mg/dL — ABNORMAL HIGH (ref 70–99)
Glucose-Capillary: 130 mg/dL — ABNORMAL HIGH (ref 70–99)
Glucose-Capillary: 135 mg/dL — ABNORMAL HIGH (ref 70–99)
Glucose-Capillary: 141 mg/dL — ABNORMAL HIGH (ref 70–99)
Glucose-Capillary: 168 mg/dL — ABNORMAL HIGH (ref 70–99)
Glucose-Capillary: 189 mg/dL — ABNORMAL HIGH (ref 70–99)

## 2011-01-04 LAB — HEMOGLOBIN A1C
Hgb A1c MFr Bld: 7.3 % — ABNORMAL HIGH (ref 4.6–6.1)
Mean Plasma Glucose: 163 mg/dL

## 2011-01-04 LAB — DIFFERENTIAL
Basophils Absolute: 0 10*3/uL (ref 0.0–0.1)
Basophils Relative: 0 % (ref 0–1)
Eosinophils Absolute: 0.1 10*3/uL (ref 0.0–0.7)
Eosinophils Relative: 1 % (ref 0–5)
Lymphocytes Relative: 8 % — ABNORMAL LOW (ref 12–46)
Lymphs Abs: 0.9 10*3/uL (ref 0.7–4.0)
Monocytes Absolute: 0.7 10*3/uL (ref 0.1–1.0)
Neutro Abs: 5.1 10*3/uL (ref 1.7–7.7)
Neutrophils Relative %: 77 % (ref 43–77)

## 2011-01-04 LAB — MAGNESIUM
Magnesium: 1.3 mg/dL — ABNORMAL LOW (ref 1.5–2.5)
Magnesium: 1.6 mg/dL (ref 1.5–2.5)

## 2011-01-04 LAB — COMPREHENSIVE METABOLIC PANEL
ALT: 18 U/L (ref 0–53)
AST: 13 U/L (ref 0–37)
Albumin: 2.6 g/dL — ABNORMAL LOW (ref 3.5–5.2)
Albumin: 3.7 g/dL (ref 3.5–5.2)
Alkaline Phosphatase: 31 U/L — ABNORMAL LOW (ref 39–117)
Alkaline Phosphatase: 35 U/L — ABNORMAL LOW (ref 39–117)
BUN: 12 mg/dL (ref 6–23)
BUN: 9 mg/dL (ref 6–23)
CO2: 28 mEq/L (ref 19–32)
CO2: 29 mEq/L (ref 19–32)
Calcium: 8.8 mg/dL (ref 8.4–10.5)
Chloride: 101 mEq/L (ref 96–112)
Chloride: 107 mEq/L (ref 96–112)
Chloride: 109 mEq/L (ref 96–112)
Creatinine, Ser: 1.57 mg/dL — ABNORMAL HIGH (ref 0.4–1.5)
Creatinine, Ser: 2.01 mg/dL — ABNORMAL HIGH (ref 0.4–1.5)
GFR calc Af Amer: >60 mL/min (ref >60)
GFR calc non Af Amer: 45 mL/min — ABNORMAL LOW (ref >60)
GFR calc non Af Amer: >60 mL/min (ref >60)
Glucose, Bld: 177 mg/dL — ABNORMAL HIGH (ref 70–99)
Potassium: 4.4 mEq/L (ref 3.5–5.1)
Potassium: 4.8 mEq/L (ref 3.5–5.1)
Sodium: 133 mEq/L — ABNORMAL LOW (ref 135–145)
Total Bilirubin: 0.6 mg/dL (ref 0.3–1.2)
Total Bilirubin: 0.7 mg/dL (ref 0.3–1.2)
Total Bilirubin: 1 mg/dL (ref 0.3–1.2)

## 2011-01-04 LAB — CROSSMATCH: ABO/RH(D): AB POS

## 2011-01-04 LAB — PREPARE RBC (CROSSMATCH)

## 2011-01-04 LAB — URINALYSIS, MICROSCOPIC ONLY
Bilirubin Urine: NEGATIVE
Glucose, UA: NEGATIVE mg/dL
Protein, ur: NEGATIVE mg/dL
Urobilinogen, UA: 0.2 mg/dL (ref 0.0–1.0)

## 2011-01-04 LAB — LIPID PANEL
Cholesterol: 97 mg/dL (ref 0–200)
HDL: 25 mg/dL — ABNORMAL LOW (ref >39)
Triglycerides: 73 mg/dL (ref <150)

## 2011-01-04 LAB — HEMOGLOBIN AND HEMATOCRIT, BLOOD
HCT: 35.2 % — ABNORMAL LOW (ref 39.0–52.0)
HCT: 35.4 % — ABNORMAL LOW (ref 39.0–52.0)

## 2011-01-04 LAB — BLOOD GAS, ARTERIAL
Acid-base deficit: 1.4 mmol/L (ref 0.0–2.0)
Bicarbonate: 23.2 mEq/L (ref 20.0–24.0)
FIO2: 100 %
O2 Content: 100 L/min
O2 Saturation: 97.1 %
Patient temperature: 37
Patient temperature: 37
pH, Arterial: 7.367 (ref 7.350–7.450)

## 2011-01-04 LAB — PHOSPHORUS: Phosphorus: 3.7 mg/dL (ref 2.3–4.6)

## 2011-01-04 LAB — CEA: CEA: 1.9 ng/mL (ref 0.0–5.0)

## 2011-01-04 LAB — TSH: TSH: 1.944 u[IU]/mL (ref 0.350–4.500)

## 2011-01-10 ENCOUNTER — Encounter (HOSPITAL_COMMUNITY): Payer: BC Managed Care – PPO | Attending: Oncology

## 2011-01-10 DIAGNOSIS — Z09 Encounter for follow-up examination after completed treatment for conditions other than malignant neoplasm: Secondary | ICD-10-CM | POA: Insufficient documentation

## 2011-01-10 DIAGNOSIS — Z85038 Personal history of other malignant neoplasm of large intestine: Secondary | ICD-10-CM | POA: Insufficient documentation

## 2011-01-10 DIAGNOSIS — I4891 Unspecified atrial fibrillation: Secondary | ICD-10-CM | POA: Insufficient documentation

## 2011-01-10 DIAGNOSIS — E119 Type 2 diabetes mellitus without complications: Secondary | ICD-10-CM | POA: Insufficient documentation

## 2011-01-10 DIAGNOSIS — Z85528 Personal history of other malignant neoplasm of kidney: Secondary | ICD-10-CM | POA: Insufficient documentation

## 2011-01-10 DIAGNOSIS — C768 Malignant neoplasm of other specified ill-defined sites: Secondary | ICD-10-CM

## 2011-01-10 DIAGNOSIS — Z79899 Other long term (current) drug therapy: Secondary | ICD-10-CM | POA: Insufficient documentation

## 2011-01-11 ENCOUNTER — Other Ambulatory Visit (HOSPITAL_COMMUNITY): Payer: Self-pay

## 2011-02-15 NOTE — Procedures (Signed)
NAMEAMRO, WINEBARGER               ACCOUNT NO.:  [id number]   MEDICAL RECORD NO.:  [id number]          PATIENT TYPE:  OUT   LOCATION:  RAD                           FACILITY:  [facility]   PHYSICIAN:  Dani Gobble, MD       DATE OF BIRTH:  28-Aug-1946   DATE OF PROCEDURE:  10/17/2004  DATE OF DISCHARGE:                                  ECHOCARDIOGRAM   INDICATIONS:  [name] is a gentleman with a history of hypertension,  diabetes mellitus, and atrial fibrillation who is referred for echo today.   TECHNICAL QUALITY:  The technical quality of this study is somewhat limited  secondary to patient's body habitus and poor acoustic windows.   FINDINGS:  1.  The aorta is within normal limits at 3.6 cm.  2.  The left atrium is moderately dilated at 5.4 cm.  The patient did appear      to be in atrial fibrillation with mild rapid ventricular response.  3.  The interventricular septum and posterior wall are mildly concentrically      thickened with additional basal septal hypertrophy overlay.  4.  The aortic valve appears mildly thickened but with normal __________      excursion.  There is early closure which may be secondary to the      underlying atrial fibrillation.  There is no significant aortic      insufficiency noted.  Doppler interrogation of the aortic valve is      within normal limits.  5.  The mitral valve appears grossly structurally normal.  No mitral valve      prolapse was noted.  Mild mitral regurgitation is noted.  Doppler      interrogation of the mitral valve is within normal limits.  6.  The pulmonic valve is incompletely visualized, but appeared to be      grossly structurally normal.  7.  The tricuspid valve also appears grossly structurally normal with mild      tricuspid regurgitation noted.  Estimated pulmonary pressures are normal      on this study.  8.  The left ventricle is normal in size subjectively.  The overall ejection      fraction may be mildly diminished  and estimated at 45-50%.  There is a      suggestion of anterior septal wall hypokinesis; however, this is      difficult to confirm based on the presence of atrial fibrillation.  9.  The right atrium is moderate-to-markedly dilated, while the right      ventricle is normal in size with preserved right ventricular systolic      function.   IMPRESSION:  1.  Technically difficulty study secondary to presence of atrial      fibrillation and patient body habitus.  2.  Moderate left atrial enlargement.  3.  Mild concentric left ventricular hypertrophy with additional basal      septal hypertrophy overlay.  4.  Minimal aortic sclerosis without stenosis.  5.  Mild mitral regurgitation.  6.  Mild tricuspid regurgitation.  7.  Estimated pulmonary pressures are  normal.  8.  Normal left ventricular chamber size with an estimated ejection fraction      of 45-50%.  There is a suggestion of anterior septal wall hypokinesis,      but this is difficult to confirm due to the presence of atrial      fibrillation.  9.  Moderate-to-marked right atrial enlargement.      AB/MEDQ  D:  10/17/2004  T:  10/17/2004  Job:  [id number]   cc:   Mila Homer. Sudie Bailey, M.D.  [address] [place], [place] [address]  Fax: [phone]   Dani Gobble, MD  Fax: [phone]

## 2011-02-15 NOTE — H&P (Signed)
NAME:  [name], [name]               ACCOUNT NO.:  [id number]   MEDICAL RECORD NO.:  [id number]          PATIENT TYPE:  AMB   LOCATION:  DAY                           FACILITY:  [facility]   PHYSICIAN:  Dalia Heading, M.D.  DATE OF BIRTH:  [birth year]   DATE OF ADMISSION:  DATE OF DISCHARGE:  LH                              HISTORY & PHYSICAL   CHIEF COMPLAINT:  Family history of colon carcinoma.   HISTORY OF PRESENT ILLNESS:  The patient is a 65 year old white male who  is referred for endoscopic evaluation.  He needs colonoscopy due to a  family history of colon carcinoma in his brother.  No abdominal pain,  weight loss, nausea, vomiting, diarrhea, constipation, melena,  hematochezia have been noted.  He has never had a colonoscopy.  He takes  Coumadin for atrial fibrillation.   PAST MEDICAL HISTORY:  Chronic atrial fibrillation, hypertension, non-  insulin-dependent diabetes mellitus.   PAST SURGICAL HISTORY:  Unremarkable.   CURRENT MEDICATIONS:  Avandamet, warfarin, simvastatin, diltiazem,  Chantix.   ALLERGIES:  No known drug allergies.   REVIEW OF SYSTEMS:  The patient smokes a half pack of cigarettes a day.  He drinks alcohol socially.  He denies any other cardiopulmonary  difficulties or bleeding disorders.   PHYSICAL EXAMINATION:  GENERAL:  The patient is a well-developed, well-  nourished white male in no acute distress.  LUNGS:  Clear to auscultation with equal breath sounds bilaterally.  HEART:  A regular rate and rhythm without S3, S4, or murmurs.  ABDOMEN:  Soft, nontender, nondistended.  No hepatosplenomegaly or  masses are noted.  RECTAL:  Deferred to the procedure.   IMPRESSION:  Family history of colon carcinoma.   PLAN:  The patient is scheduled for a colonoscopy on June 26, 2009.  The risks and benefits of the procedure including bleeding and  perforation were fully explained to the patient, gave informed consent.  He is to stop his Coumadin 4 days  prior to the procedure.      Dalia Heading, M.D.  Electronically Signed     MAJ/MEDQ  D:  06/01/2009  T:  06/02/2009  Job:  [id number]   cc:   Chase Picket at [facility]. Sudie Bailey, M.D.  Fax: [phone]

## 2011-02-21 ENCOUNTER — Other Ambulatory Visit (HOSPITAL_COMMUNITY): Payer: BC Managed Care – PPO

## 2011-03-08 ENCOUNTER — Encounter (HOSPITAL_COMMUNITY): Payer: BC Managed Care – PPO | Attending: Oncology

## 2011-03-08 ENCOUNTER — Other Ambulatory Visit (HOSPITAL_COMMUNITY): Payer: Self-pay | Admitting: Oncology

## 2011-03-08 DIAGNOSIS — Z85038 Personal history of other malignant neoplasm of large intestine: Secondary | ICD-10-CM | POA: Insufficient documentation

## 2011-03-08 DIAGNOSIS — Z85528 Personal history of other malignant neoplasm of kidney: Secondary | ICD-10-CM | POA: Insufficient documentation

## 2011-03-08 DIAGNOSIS — Z79899 Other long term (current) drug therapy: Secondary | ICD-10-CM | POA: Insufficient documentation

## 2011-03-08 DIAGNOSIS — I4891 Unspecified atrial fibrillation: Secondary | ICD-10-CM | POA: Insufficient documentation

## 2011-03-08 DIAGNOSIS — C189 Malignant neoplasm of colon, unspecified: Secondary | ICD-10-CM

## 2011-03-08 DIAGNOSIS — Z09 Encounter for follow-up examination after completed treatment for conditions other than malignant neoplasm: Secondary | ICD-10-CM | POA: Insufficient documentation

## 2011-03-08 DIAGNOSIS — E119 Type 2 diabetes mellitus without complications: Secondary | ICD-10-CM | POA: Insufficient documentation

## 2011-03-11 ENCOUNTER — Encounter (HOSPITAL_COMMUNITY): Payer: BC Managed Care – PPO | Admitting: Oncology

## 2011-03-11 ENCOUNTER — Other Ambulatory Visit (HOSPITAL_COMMUNITY): Payer: Self-pay | Admitting: Oncology

## 2011-03-11 DIAGNOSIS — C189 Malignant neoplasm of colon, unspecified: Secondary | ICD-10-CM

## 2011-04-19 ENCOUNTER — Encounter (HOSPITAL_COMMUNITY): Payer: BC Managed Care – PPO | Attending: Oncology

## 2011-04-19 DIAGNOSIS — I82409 Acute embolism and thrombosis of unspecified deep veins of unspecified lower extremity: Secondary | ICD-10-CM

## 2011-04-19 LAB — PROTIME-INR: INR: 1.79 — ABNORMAL HIGH (ref 0.00–1.49)

## 2011-04-19 MED ORDER — SODIUM CHLORIDE 0.9 % IJ SOLN
INTRAMUSCULAR | Status: AC
  Administered 2011-04-19: 10 mL
  Filled 2011-04-19: qty 10

## 2011-04-19 MED ORDER — HEPARIN SOD (PORK) LOCK FLUSH 100 UNIT/ML IV SOLN
INTRAVENOUS | Status: AC
  Administered 2011-04-19: 500 [IU]
  Filled 2011-04-19: qty 5

## 2011-04-19 NOTE — Progress Notes (Signed)
[name] presented for Portacath access and flush. Proper placement of portacath confirmed by CXR. Portacath located left chest wall accessed with  H 20 needle. Good blood return present. Portacath flushed with 20ml NS and 500U/5ml Heparin and needle removed intact. Procedure without incident. Patient tolerated procedure well.   

## 2011-05-31 ENCOUNTER — Encounter (HOSPITAL_COMMUNITY): Payer: Medicare Other | Attending: Oncology

## 2011-05-31 DIAGNOSIS — C189 Malignant neoplasm of colon, unspecified: Secondary | ICD-10-CM | POA: Insufficient documentation

## 2011-05-31 DIAGNOSIS — Z452 Encounter for adjustment and management of vascular access device: Secondary | ICD-10-CM

## 2011-05-31 MED ORDER — HEPARIN SOD (PORK) LOCK FLUSH 100 UNIT/ML IV SOLN
500.0000 [IU] | Freq: Once | INTRAVENOUS | Status: AC
  Administered 2011-05-31: 500 [IU] via INTRAVENOUS
  Filled 2011-05-31: qty 5

## 2011-05-31 MED ORDER — HEPARIN SOD (PORK) LOCK FLUSH 100 UNIT/ML IV SOLN
INTRAVENOUS | Status: AC
  Administered 2011-05-31: 500 [IU] via INTRAVENOUS
  Filled 2011-05-31: qty 5

## 2011-05-31 NOTE — Progress Notes (Signed)
Port accessed and flushed per clinic protocol.  Good blood return.    Tolerated well. 

## 2011-06-13 ENCOUNTER — Other Ambulatory Visit (HOSPITAL_COMMUNITY): Payer: BC Managed Care – PPO

## 2011-06-14 ENCOUNTER — Encounter (HOSPITAL_COMMUNITY): Payer: BC Managed Care – PPO | Attending: Oncology

## 2011-06-14 DIAGNOSIS — C189 Malignant neoplasm of colon, unspecified: Secondary | ICD-10-CM | POA: Insufficient documentation

## 2011-06-14 DIAGNOSIS — I712 Thoracic aortic aneurysm, without rupture, unspecified: Secondary | ICD-10-CM | POA: Insufficient documentation

## 2011-06-14 LAB — DIFFERENTIAL
Basophils Relative: 1 % (ref 0–1)
Eosinophils Relative: 2 % (ref 0–5)
Monocytes Absolute: 0.6 10*3/uL (ref 0.1–1.0)
Monocytes Relative: 11 % (ref 3–12)
Neutro Abs: 3.6 10*3/uL (ref 1.7–7.7)

## 2011-06-14 LAB — COMPREHENSIVE METABOLIC PANEL
Albumin: 3.7 g/dL (ref 3.5–5.2)
BUN: 26 mg/dL — ABNORMAL HIGH (ref 6–23)
CO2: 27 mEq/L (ref 19–32)
Chloride: 102 mEq/L (ref 96–112)
Creatinine, Ser: 1.6 mg/dL — ABNORMAL HIGH (ref 0.50–1.35)
GFR calc non Af Amer: 44 mL/min — ABNORMAL LOW (ref >60)
Total Bilirubin: 0.3 mg/dL (ref 0.3–1.2)

## 2011-06-14 LAB — CEA: CEA: 2.2 ng/mL (ref 0.0–5.0)

## 2011-06-14 LAB — CBC
HCT: 40.8 % (ref 39.0–52.0)
Hemoglobin: 13.7 g/dL (ref 13.0–17.0)
MCHC: 33.6 g/dL (ref 30.0–36.0)
MCV: 88.9 fL (ref 78.0–100.0)

## 2011-06-14 MED ORDER — HEPARIN SOD (PORK) LOCK FLUSH 100 UNIT/ML IV SOLN
500.0000 [IU] | Freq: Once | INTRAVENOUS | Status: AC
  Administered 2011-06-14: 500 [IU] via INTRAVENOUS
  Filled 2011-06-14: qty 5

## 2011-06-14 MED ORDER — SODIUM CHLORIDE 0.9 % IJ SOLN
10.0000 mL | INTRAMUSCULAR | Status: DC | PRN
  Administered 2011-06-14: 10 mL via INTRAVENOUS
  Filled 2011-06-14: qty 10

## 2011-06-14 MED ORDER — HEPARIN SOD (PORK) LOCK FLUSH 100 UNIT/ML IV SOLN
INTRAVENOUS | Status: AC
  Administered 2011-06-14: 500 [IU] via INTRAVENOUS
  Filled 2011-06-14: qty 5

## 2011-06-14 MED ORDER — SODIUM CHLORIDE 0.9 % IJ SOLN
INTRAMUSCULAR | Status: AC
  Administered 2011-06-14: 10 mL via INTRAVENOUS
  Filled 2011-06-14: qty 10

## 2011-06-14 NOTE — Progress Notes (Signed)
[name] presented for Portacath access and flush. Proper placement of portacath confirmed by CXR. Portacath located lt chest wall accessed with  H 20 needle. Good blood return present. Labs drawn for cbc diff cmet and cea Portacath flushed with 20ml NS and 500U/52ml Heparin and needle removed intact. Procedure without incident. Patient tolerated procedure well.

## 2011-06-17 ENCOUNTER — Encounter (HOSPITAL_COMMUNITY): Payer: Self-pay

## 2011-06-17 ENCOUNTER — Ambulatory Visit (HOSPITAL_COMMUNITY)
Admission: RE | Admit: 2011-06-17 | Discharge: 2011-06-17 | Disposition: A | Discharge status: Home / Self Care | Payer: BC Managed Care – PPO | Source: Ambulatory Visit | Attending: Oncology | Admitting: Oncology

## 2011-06-17 DIAGNOSIS — Z9221 Personal history of antineoplastic chemotherapy: Secondary | ICD-10-CM | POA: Insufficient documentation

## 2011-06-17 DIAGNOSIS — C189 Malignant neoplasm of colon, unspecified: Secondary | ICD-10-CM | POA: Insufficient documentation

## 2011-06-17 DIAGNOSIS — K8020 Calculus of gallbladder without cholecystitis without obstruction: Secondary | ICD-10-CM | POA: Insufficient documentation

## 2011-06-17 DIAGNOSIS — Z9049 Acquired absence of other specified parts of digestive tract: Secondary | ICD-10-CM | POA: Insufficient documentation

## 2011-06-17 DIAGNOSIS — Z905 Acquired absence of kidney: Secondary | ICD-10-CM | POA: Insufficient documentation

## 2011-06-17 HISTORY — DX: Malignant (primary) neoplasm, unspecified: C80.1

## 2011-06-19 ENCOUNTER — Encounter (HOSPITAL_BASED_OUTPATIENT_CLINIC_OR_DEPARTMENT_OTHER): Payer: BC Managed Care – PPO | Admitting: Oncology

## 2011-06-19 ENCOUNTER — Encounter (HOSPITAL_COMMUNITY): Payer: Self-pay | Admitting: Oncology

## 2011-06-19 VITALS — BP 107/73 | HR 84 | Temp 97.5°F | Wt 318.4 lb

## 2011-06-19 DIAGNOSIS — I77810 Thoracic aortic ectasia: Secondary | ICD-10-CM

## 2011-06-19 DIAGNOSIS — C189 Malignant neoplasm of colon, unspecified: Secondary | ICD-10-CM

## 2011-06-19 DIAGNOSIS — I712 Thoracic aortic aneurysm, without rupture, unspecified: Secondary | ICD-10-CM

## 2011-06-19 NOTE — Progress Notes (Signed)
This office note has been dictated.

## 2011-06-19 NOTE — Patient Instructions (Signed)
[facility]  Discharge Instructions  RECOMMENDATIONS MADE BY THE CONSULTANT AND ANY TEST RESULTS WILL BE SENT TO YOUR REFERRING DOCTOR.   EXAM FINDINGS BY MD TODAY AND SIGNS AND SYMPTOMS TO REPORT TO CLINIC OR PRIMARY MD: no signs of cancer. You do have some "junk" in your lungs. Need to breathe deep, exercise, expand lungs.     INSTRUCTIONS GIVEN AND DISCUSSED: Other  Continue CEA every 12 weeks and port flush every 6 weeks.  SPECIAL INSTRUCTIONS/FOLLOW-UP: Return in 6 months   I acknowledge that I have been informed and understand all the instructions given to me and received a copy. I do not have any more questions at this time, but understand that I may call the Specialty Clinic at [facility] at [phone] during business hours should I have any further questions or need assistance in obtaining follow-up care.    __________________________________________  _____________  __________ Signature of Patient or Authorized Representative            Date                   Time    __________________________________________ Nurse's Signature

## 2011-06-19 NOTE — Progress Notes (Signed)
CC:   Dalia Heading, M.D. Mila Homer. Sudie Bailey, M.D.  DIAGNOSES: 1. 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  He     is status post surgery followed by chemotherapy with FOLFOX.  He     received 6 full cycles of therapy.  He presented with this disease     in late 2010.  He finished all chemotherapy as of 03/20/2010. 2. Right-sided kidney cancer grade 4, clear cell type, 4.3 cm with     surgery on 06/28/2009.  No lymph nodes were found, but margins were     clear. The renal vein was not involved and there is no evidence of     recurrence thus far. 3. Atrial fibrillation on Coumadin, managed by Dr. Sudie Bailey. 4. Diabetes mellitus under better control. 5. Obesity, still weighing 300+ pounds, today 318 pounds. 6. History of COPD with 2 packs a day smoking history of 34 years. 7. 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  [name] is here today with his wife to go over the CT scans which were done yesterday.  They show no evidence of recurrent disease.  His remaining kidney still looks good.  His liver looks good.  Lungs show a little nonspecific changes in the left lung that are "inflammatory- looking" but he has no symptoms.  No cough, no sputum production.  No fever.  He has done well.  He has been active all summer but "just can't lose weight."  He states he is watching better when he eats and his sugars he states are running right around 100.  I do not have a recent hemoglobin A1c.  His CT of the chest did show a nonspecific thyroid nodule, which is stable from 2 years ago, but also shows some thoracic aortic dilatation which needs to be observed.  I do not think anybody would really want to operate on him with his obesity, his history of colon cancer, kidney cancer and diabetes which is not perfectly controlled at this juncture, so will just repeat scans next September.  I offered him a consultation but he will just think about that.  PHYSICAL EXAMINATION:  Exam shows  clear lung fields today, stable vital signs. No fever, respirations 18 and unlabored.  Lungs are really very clear.  Heart shows regular rhythm and rate without murmur per gallop. He has no adenopathy in the cervical, supraclavicular, infraclavicular, or axillary areas.  We will see him every 3 months for a CEA level.  I will see him in 6 months for an exam.  His CEA the other day was also well within the normal range at 2.2.  I  have encouraged him to continue weight reduction if possible, control of his diabetes, ongoing follow up with Dr. Lovell Sheehan and down to let us know if things change him from a pulmonary standpoint.    ______________________________ Ladona Horns. Mariel Sleet, MD ESN/MEDQ  D:  06/19/2011  T:  06/19/2011  Job:  [id number]

## 2011-07-12 ENCOUNTER — Encounter (HOSPITAL_COMMUNITY): Payer: Medicare Other | Attending: Oncology

## 2011-07-12 VITALS — Wt 317.0 lb

## 2011-07-12 DIAGNOSIS — Z452 Encounter for adjustment and management of vascular access device: Secondary | ICD-10-CM

## 2011-07-12 DIAGNOSIS — C189 Malignant neoplasm of colon, unspecified: Secondary | ICD-10-CM | POA: Insufficient documentation

## 2011-07-12 MED ORDER — HEPARIN SOD (PORK) LOCK FLUSH 100 UNIT/ML IV SOLN
INTRAVENOUS | Status: AC
  Administered 2011-07-12: 500 [IU] via INTRAVENOUS
  Filled 2011-07-12: qty 5

## 2011-07-12 MED ORDER — SODIUM CHLORIDE 0.9 % IJ SOLN
10.0000 mL | Freq: Once | INTRAMUSCULAR | Status: AC
  Administered 2011-07-12: 10 mL via INTRAVENOUS
  Filled 2011-07-12: qty 10

## 2011-07-12 MED ORDER — SODIUM CHLORIDE 0.9 % IJ SOLN
INTRAMUSCULAR | Status: AC
  Administered 2011-07-12: 10 mL via INTRAVENOUS
  Filled 2011-07-12: qty 10

## 2011-07-12 MED ORDER — HEPARIN SOD (PORK) LOCK FLUSH 100 UNIT/ML IV SOLN
500.0000 [IU] | Freq: Once | INTRAVENOUS | Status: AC
  Administered 2011-07-12: 500 [IU] via INTRAVENOUS
  Filled 2011-07-12: qty 5

## 2011-07-12 NOTE — Progress Notes (Signed)
[name] presented for Portacath access and flush. Proper placement of portacath confirmed by CXR. Portacath located left chest wall accessed with  H 20 needle. Good blood return present. Portacath flushed with 20ml NS and 500U/5ml Heparin and needle removed intact. Procedure without incident. Patient tolerated procedure well.   

## 2011-08-21 ENCOUNTER — Encounter (HOSPITAL_COMMUNITY): Payer: Medicare Other | Attending: Oncology

## 2011-08-21 DIAGNOSIS — C189 Malignant neoplasm of colon, unspecified: Secondary | ICD-10-CM | POA: Insufficient documentation

## 2011-08-21 DIAGNOSIS — Z452 Encounter for adjustment and management of vascular access device: Secondary | ICD-10-CM

## 2011-08-21 MED ORDER — HEPARIN SOD (PORK) LOCK FLUSH 100 UNIT/ML IV SOLN
500.0000 [IU] | Freq: Once | INTRAVENOUS | Status: AC
  Administered 2011-08-21: 500 [IU] via INTRAVENOUS
  Filled 2011-08-21: qty 5

## 2011-08-21 MED ORDER — SODIUM CHLORIDE 0.9 % IJ SOLN
INTRAMUSCULAR | Status: AC
  Administered 2011-08-21: 10 mL via INTRAVENOUS
  Filled 2011-08-21: qty 10

## 2011-08-21 MED ORDER — HEPARIN SOD (PORK) LOCK FLUSH 100 UNIT/ML IV SOLN
INTRAVENOUS | Status: AC
  Administered 2011-08-21: 500 [IU] via INTRAVENOUS
  Filled 2011-08-21: qty 5

## 2011-08-21 MED ORDER — SODIUM CHLORIDE 0.9 % IJ SOLN
10.0000 mL | INTRAMUSCULAR | Status: DC | PRN
  Administered 2011-08-21: 10 mL via INTRAVENOUS
  Filled 2011-08-21: qty 10

## 2011-08-21 NOTE — Progress Notes (Signed)
Good blood return. Tolerated well. 

## 2011-10-04 ENCOUNTER — Encounter (HOSPITAL_COMMUNITY): Payer: Medicare Other | Attending: Oncology

## 2011-10-04 DIAGNOSIS — Z452 Encounter for adjustment and management of vascular access device: Secondary | ICD-10-CM

## 2011-10-04 DIAGNOSIS — C189 Malignant neoplasm of colon, unspecified: Secondary | ICD-10-CM | POA: Insufficient documentation

## 2011-10-04 MED ORDER — HEPARIN SOD (PORK) LOCK FLUSH 100 UNIT/ML IV SOLN
500.0000 [IU] | Freq: Once | INTRAVENOUS | Status: AC
  Administered 2011-10-04: 500 [IU] via INTRAVENOUS
  Filled 2011-10-04: qty 5

## 2011-10-04 MED ORDER — HEPARIN SOD (PORK) LOCK FLUSH 100 UNIT/ML IV SOLN
INTRAVENOUS | Status: AC
  Administered 2011-10-04: 500 [IU] via INTRAVENOUS
  Filled 2011-10-04: qty 5

## 2011-10-04 MED ORDER — SODIUM CHLORIDE 0.9 % IJ SOLN
10.0000 mL | INTRAMUSCULAR | Status: DC | PRN
  Administered 2011-10-04: 10 mL via INTRAVENOUS
  Filled 2011-10-04: qty 10

## 2011-10-04 MED ORDER — SODIUM CHLORIDE 0.9 % IJ SOLN
INTRAMUSCULAR | Status: AC
  Administered 2011-10-04: 10 mL via INTRAVENOUS
  Filled 2011-10-04: qty 10

## 2011-10-04 NOTE — Progress Notes (Signed)
Tolerated port flush well. 

## 2011-11-15 ENCOUNTER — Encounter (HOSPITAL_COMMUNITY): Payer: BC Managed Care – PPO | Attending: Oncology

## 2011-11-15 ENCOUNTER — Other Ambulatory Visit (HOSPITAL_COMMUNITY): Payer: Self-pay | Admitting: Oncology

## 2011-11-15 ENCOUNTER — Telehealth (HOSPITAL_COMMUNITY): Payer: Self-pay | Admitting: *Deleted

## 2011-11-15 DIAGNOSIS — C186 Malignant neoplasm of descending colon: Secondary | ICD-10-CM

## 2011-11-15 DIAGNOSIS — C189 Malignant neoplasm of colon, unspecified: Secondary | ICD-10-CM | POA: Insufficient documentation

## 2011-11-15 LAB — CBC
Hemoglobin: 14.6 g/dL (ref 13.0–17.0)
Platelets: 170 10*3/uL (ref 150–400)
RBC: 4.86 MIL/uL (ref 4.22–5.81)
WBC: 6.7 10*3/uL (ref 4.0–10.5)

## 2011-11-15 LAB — COMPREHENSIVE METABOLIC PANEL
ALT: 16 U/L (ref 0–53)
AST: 10 U/L (ref 0–37)
Alkaline Phosphatase: 65 U/L (ref 39–117)
CO2: 26 mEq/L (ref 19–32)
Chloride: 103 mEq/L (ref 96–112)
GFR calc Af Amer: 57 mL/min — ABNORMAL LOW (ref >90)
GFR calc non Af Amer: 49 mL/min — ABNORMAL LOW (ref >90)
Glucose, Bld: 180 mg/dL — ABNORMAL HIGH (ref 70–99)
Potassium: 4.2 mEq/L (ref 3.5–5.1)
Sodium: 137 mEq/L (ref 135–145)

## 2011-11-15 LAB — CEA: CEA: 1.6 ng/mL (ref 0.0–5.0)

## 2011-11-15 MED ORDER — HEPARIN SOD (PORK) LOCK FLUSH 100 UNIT/ML IV SOLN
INTRAVENOUS | Status: AC
  Administered 2011-11-15: 500 [IU] via INTRAVENOUS
  Filled 2011-11-15: qty 5

## 2011-11-15 MED ORDER — SODIUM CHLORIDE 0.9 % IJ SOLN
INTRAMUSCULAR | Status: AC
  Filled 2011-11-15: qty 10

## 2011-11-15 MED ORDER — HEPARIN SOD (PORK) LOCK FLUSH 100 UNIT/ML IV SOLN
500.0000 [IU] | Freq: Once | INTRAVENOUS | Status: AC
  Administered 2011-11-15: 500 [IU] via INTRAVENOUS
  Filled 2011-11-15: qty 5

## 2011-11-15 MED ORDER — SODIUM CHLORIDE 0.9 % IJ SOLN
10.0000 mL | INTRAMUSCULAR | Status: DC | PRN
  Administered 2011-11-15: 10 mL via INTRAVENOUS
  Filled 2011-11-15: qty 10

## 2011-11-15 NOTE — Telephone Encounter (Signed)
Message copied by Dennie Maizes on Fri Nov 15, 2011 11:52 AM ------      Message from: Mariel Sleet, ERIC S      Created: Fri Nov 15, 2011 11:36 AM       Stable

## 2011-11-15 NOTE — Telephone Encounter (Signed)
Message left on answering machine as below. 

## 2011-11-15 NOTE — Progress Notes (Signed)
[name] presented for Portacath access and flush. Proper placement of portacath confirmed by CXR. Portacath located left chest wall accessed with  H 20 needle. Good blood return present.  Labs drawn from patient's port. Portacath flushed with 20ml NS and 500U/36ml Heparin and needle removed intact. Procedure without incident. Patient tolerated procedure well.

## 2011-12-13 ENCOUNTER — Other Ambulatory Visit (HOSPITAL_COMMUNITY): Payer: BC Managed Care – PPO

## 2011-12-17 ENCOUNTER — Encounter (HOSPITAL_COMMUNITY): Payer: Medicare Other | Attending: Oncology | Admitting: Oncology

## 2011-12-17 VITALS — BP 117/77 | HR 91 | Temp 98.9°F | Ht 74.0 in | Wt 323.0 lb

## 2011-12-17 DIAGNOSIS — J449 Chronic obstructive pulmonary disease, unspecified: Secondary | ICD-10-CM

## 2011-12-17 DIAGNOSIS — C649 Malignant neoplasm of unspecified kidney, except renal pelvis: Secondary | ICD-10-CM

## 2011-12-17 DIAGNOSIS — C189 Malignant neoplasm of colon, unspecified: Secondary | ICD-10-CM | POA: Insufficient documentation

## 2011-12-17 DIAGNOSIS — C186 Malignant neoplasm of descending colon: Secondary | ICD-10-CM

## 2011-12-17 DIAGNOSIS — E119 Type 2 diabetes mellitus without complications: Secondary | ICD-10-CM

## 2011-12-17 DIAGNOSIS — I878 Other specified disorders of veins: Secondary | ICD-10-CM

## 2011-12-17 MED ORDER — SODIUM CHLORIDE 0.9 % IJ SOLN
10.0000 mL | INTRAMUSCULAR | Status: DC | PRN
  Administered 2011-12-17: 10 mL via INTRAVENOUS
  Filled 2011-12-17: qty 10

## 2011-12-17 MED ORDER — HEPARIN SOD (PORK) LOCK FLUSH 100 UNIT/ML IV SOLN
500.0000 [IU] | Freq: Once | INTRAVENOUS | Status: AC
  Administered 2011-12-17: 500 [IU] via INTRAVENOUS
  Filled 2011-12-17: qty 5

## 2011-12-17 MED ORDER — HEPARIN SOD (PORK) LOCK FLUSH 100 UNIT/ML IV SOLN
INTRAVENOUS | Status: AC
  Administered 2011-12-17: 500 [IU] via INTRAVENOUS
  Filled 2011-12-17: qty 5

## 2011-12-17 MED ORDER — SODIUM CHLORIDE 0.9 % IJ SOLN
INTRAMUSCULAR | Status: AC
  Filled 2011-12-17: qty 10

## 2011-12-17 NOTE — Progress Notes (Signed)
CC:   [name]. [name], M.D. [name], M.D.  REFERRING PHYSICIAN:  Mila Shea. [name], M.D.  SECOND REFERRING PHYSICIAN:  Dalia Shea, M.D.  DIAGNOSIS: 1. Stage III cancer of the colon with 4/32 positive nodes status post     surgery on 06/28/2009.  He had a 6 cm tumor with 4/32 positive     nodes.  He is status post chemotherapy with FOLFOX x6 full cycles.     He finished all chemotherapy as of 03/20/2010. 2. Right-sided kidney cancer grade 4, clear cell type, 4.3 cm with     surgery on 06/28/2009.  Margins were clear.  No lymph nodes were     found in the specimen.  Renal vein was not involved however.  He     has had no recurrence thus far. 3. Atrial fibrillation on Coumadin managed by Dr. Sudie Shea. 4. Diabetes mellitus under in between control. 5. Obesity weighing 321 pounds today. 6. Chronic obstructive pulmonary disease status post 2 packs of     cigarettes a day for 34 years. 7. 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 [name] had scans in September of this past year which were negative for recurrent disease.  His CEA remains in the normal range.  Sugar the other day was 180.  Creatinine is 1.46.  CBC was normal.  He is asymptomatic on review of systems.  He just does not get out and do much anymore and that is probably why he is gaining weight.  Plus he states his appetite is just terrific.  He is not always eating the right foods after talking to him extensively about his diet.  OBJECTIVE:  His vital signs show a weight 323 pounds actually, 6 feet 2 inches tall, BMI 41.6, blood pressure 117/76 today, pulse 88 and regular, respirations 16-18 and unlabored.  He is afebrile.  He denies any pain.  His abdomen is obese, nontender without obvious masses. Colostomy is intact.  He has clear lung fields.  Heart shows a regular rhythm and rate at this time.  I did not hear a really irregularly irregular rhythm.  His lymph node exam is negative throughout including cervical,  supraclavicular, infraclavicular, axillary, and inguinal areas.  He does not have peripheral leg edema.  I did not detect thyromegaly.  He is to see Dr. Sudie Shea today.  We will see him every 3 months for a CEA level, 6 months for an exam, and he will need to have CT scan set up at that time.  I have encouraged him to get out and move around more.  We will see him sooner if need be.    ______________________________ [name]. Dwayne Sleet, MD ESN/MEDQ  D:  12/17/2011  T:  12/17/2011  Job:  [id number]

## 2011-12-17 NOTE — Progress Notes (Signed)
This office note has been dictated.

## 2011-12-17 NOTE — Patient Instructions (Signed)
[name]  [id number] 1946/08/21   [facility]  Discharge Instructions  RECOMMENDATIONS MADE BY THE CONSULTANT AND ANY TEST RESULTS WILL BE SENT TO YOUR REFERRING DOCTOR.   EXAM FINDINGS BY MD TODAY AND SIGNS AND SYMPTOMS TO REPORT TO CLINIC OR PRIMARY MD: you are doing well.  Report changes in bowel habits, blood in stool, etc.  Need to lose weight.  MEDICATIONS PRESCRIBED: none   SPECIAL INSTRUCTIONS/FOLLOW-UP: Lab work Needed Port flush every 6 weeks, CEA every 12 weeks and Return to Clinic in 6 months to see PA   I acknowledge that I have been informed and understand all the instructions given to me and received a copy. I do not have any more questions at this time, but understand that I may call the Specialty Clinic at [facility] at [phone] during business hours should I have any further questions or need assistance in obtaining follow-up care.    __________________________________________  _____________  __________ Signature of Patient or Authorized Representative            Date                   Time    __________________________________________ Nurse's Signature

## 2012-01-28 ENCOUNTER — Encounter (HOSPITAL_COMMUNITY): Payer: Medicare Other | Attending: Oncology

## 2012-01-28 DIAGNOSIS — C186 Malignant neoplasm of descending colon: Secondary | ICD-10-CM

## 2012-01-28 DIAGNOSIS — C189 Malignant neoplasm of colon, unspecified: Secondary | ICD-10-CM | POA: Insufficient documentation

## 2012-01-28 DIAGNOSIS — Z452 Encounter for adjustment and management of vascular access device: Secondary | ICD-10-CM

## 2012-01-28 MED ORDER — SODIUM CHLORIDE 0.9 % IJ SOLN
10.0000 mL | Freq: Once | INTRAMUSCULAR | Status: AC
  Administered 2012-01-28: 10 mL via INTRAVENOUS
  Filled 2012-01-28: qty 10

## 2012-01-28 MED ORDER — SODIUM CHLORIDE 0.9 % IJ SOLN
INTRAMUSCULAR | Status: AC
  Filled 2012-01-28: qty 10

## 2012-01-28 MED ORDER — HEPARIN SOD (PORK) LOCK FLUSH 100 UNIT/ML IV SOLN
500.0000 [IU] | Freq: Once | INTRAVENOUS | Status: AC
  Administered 2012-01-28: 500 [IU] via INTRAVENOUS
  Filled 2012-01-28: qty 5

## 2012-01-28 MED ORDER — HEPARIN SOD (PORK) LOCK FLUSH 100 UNIT/ML IV SOLN
INTRAVENOUS | Status: AC
  Filled 2012-01-28: qty 5

## 2012-01-28 NOTE — Progress Notes (Signed)
[name] presented for Portacath access and flush. Proper placement of portacath confirmed by CXR. Portacath located left chest wall accessed with  H 20 needle. Good blood return present. Portacath flushed with 20ml NS and 500U/5ml Heparin and needle removed intact. Procedure without incident. Patient tolerated procedure well.   

## 2012-03-10 ENCOUNTER — Encounter (HOSPITAL_COMMUNITY): Payer: BC Managed Care – PPO

## 2012-03-18 ENCOUNTER — Encounter (HOSPITAL_COMMUNITY): Payer: BC Managed Care – PPO | Attending: Oncology

## 2012-03-18 ENCOUNTER — Encounter (HOSPITAL_COMMUNITY): Payer: BC Managed Care – PPO

## 2012-03-18 DIAGNOSIS — C189 Malignant neoplasm of colon, unspecified: Secondary | ICD-10-CM

## 2012-03-18 DIAGNOSIS — Z452 Encounter for adjustment and management of vascular access device: Secondary | ICD-10-CM

## 2012-03-18 DIAGNOSIS — C186 Malignant neoplasm of descending colon: Secondary | ICD-10-CM

## 2012-03-18 LAB — CEA: CEA: 2.3 ng/mL (ref 0.0–5.0)

## 2012-03-18 MED ORDER — SODIUM CHLORIDE 0.9 % IJ SOLN
INTRAMUSCULAR | Status: AC
  Filled 2012-03-18: qty 10

## 2012-03-18 MED ORDER — SODIUM CHLORIDE 0.9 % IJ SOLN
10.0000 mL | INTRAMUSCULAR | Status: DC | PRN
  Administered 2012-03-18: 10 mL via INTRAVENOUS
  Filled 2012-03-18: qty 10

## 2012-03-18 MED ORDER — HEPARIN SOD (PORK) LOCK FLUSH 100 UNIT/ML IV SOLN
500.0000 [IU] | Freq: Once | INTRAVENOUS | Status: AC
  Administered 2012-03-18: 500 [IU] via INTRAVENOUS
  Filled 2012-03-18: qty 5

## 2012-03-18 MED ORDER — HEPARIN SOD (PORK) LOCK FLUSH 100 UNIT/ML IV SOLN
INTRAVENOUS | Status: AC
  Filled 2012-03-18: qty 5

## 2012-03-18 NOTE — Progress Notes (Signed)
[name] presented for Portacath access and flush and labs.  Proper placement of portacath confirmed by CXR.  Portacath located left chest wall accessed with  H 20 needle.  Good blood return present and labs were obtained. Portacath flushed with 20ml NS and 500U/71ml Heparin and needle removed intact.  Procedure without incident.  Patient tolerated procedure well.

## 2012-04-21 ENCOUNTER — Encounter (HOSPITAL_COMMUNITY): Payer: Medicare Other | Attending: Oncology

## 2012-04-21 DIAGNOSIS — C189 Malignant neoplasm of colon, unspecified: Secondary | ICD-10-CM | POA: Insufficient documentation

## 2012-04-21 DIAGNOSIS — C186 Malignant neoplasm of descending colon: Secondary | ICD-10-CM

## 2012-04-21 DIAGNOSIS — Z452 Encounter for adjustment and management of vascular access device: Secondary | ICD-10-CM

## 2012-04-21 MED ORDER — HEPARIN SOD (PORK) LOCK FLUSH 100 UNIT/ML IV SOLN
INTRAVENOUS | Status: AC
  Filled 2012-04-21: qty 5

## 2012-04-21 MED ORDER — HEPARIN SOD (PORK) LOCK FLUSH 100 UNIT/ML IV SOLN
500.0000 [IU] | Freq: Once | INTRAVENOUS | Status: AC
  Administered 2012-04-21: 500 [IU] via INTRAVENOUS
  Filled 2012-04-21: qty 5

## 2012-04-21 MED ORDER — SODIUM CHLORIDE 0.9 % IJ SOLN
INTRAMUSCULAR | Status: AC
  Filled 2012-04-21: qty 10

## 2012-04-21 MED ORDER — SODIUM CHLORIDE 0.9 % IJ SOLN
10.0000 mL | INTRAMUSCULAR | Status: DC | PRN
  Administered 2012-04-21: 10 mL via INTRAVENOUS
  Filled 2012-04-21: qty 10

## 2012-04-21 NOTE — Progress Notes (Signed)
Tolerated port flush well. 

## 2012-06-15 ENCOUNTER — Other Ambulatory Visit (HOSPITAL_COMMUNITY): Payer: BC Managed Care – PPO

## 2012-06-16 ENCOUNTER — Ambulatory Visit (HOSPITAL_COMMUNITY)
Admission: RE | Admit: 2012-06-16 | Discharge: 2012-06-16 | Disposition: A | Discharge status: Home / Self Care | Payer: BC Managed Care – PPO | Source: Ambulatory Visit | Attending: Oncology | Admitting: Oncology

## 2012-06-16 ENCOUNTER — Other Ambulatory Visit (HOSPITAL_COMMUNITY): Payer: BC Managed Care – PPO

## 2012-06-16 DIAGNOSIS — I77810 Thoracic aortic ectasia: Secondary | ICD-10-CM

## 2012-06-16 DIAGNOSIS — C189 Malignant neoplasm of colon, unspecified: Secondary | ICD-10-CM | POA: Insufficient documentation

## 2012-06-17 ENCOUNTER — Encounter (HOSPITAL_COMMUNITY): Payer: BC Managed Care – PPO | Attending: Oncology

## 2012-06-17 DIAGNOSIS — C189 Malignant neoplasm of colon, unspecified: Secondary | ICD-10-CM

## 2012-06-17 DIAGNOSIS — C186 Malignant neoplasm of descending colon: Secondary | ICD-10-CM

## 2012-06-17 LAB — DIFFERENTIAL
Eosinophils Absolute: 0.1 10*3/uL (ref 0.0–0.7)
Lymphs Abs: 0.9 10*3/uL (ref 0.7–4.0)
Monocytes Relative: 10 % (ref 3–12)
Neutrophils Relative %: 78 % — ABNORMAL HIGH (ref 43–77)

## 2012-06-17 LAB — CBC
HCT: 40.1 % (ref 39.0–52.0)
Hemoglobin: 13.6 g/dL (ref 13.0–17.0)
MCH: 30.4 pg (ref 26.0–34.0)
RBC: 4.48 MIL/uL (ref 4.22–5.81)

## 2012-06-17 LAB — COMPREHENSIVE METABOLIC PANEL
Alkaline Phosphatase: 64 U/L (ref 39–117)
BUN: 15 mg/dL (ref 6–23)
Chloride: 103 mEq/L (ref 96–112)
GFR calc Af Amer: 55 mL/min — ABNORMAL LOW (ref >90)
Glucose, Bld: 125 mg/dL — ABNORMAL HIGH (ref 70–99)
Potassium: 4.5 mEq/L (ref 3.5–5.1)
Total Bilirubin: 0.3 mg/dL (ref 0.3–1.2)
Total Protein: 7 g/dL (ref 6.0–8.3)

## 2012-06-17 MED ORDER — SODIUM CHLORIDE 0.9 % IJ SOLN
INTRAMUSCULAR | Status: AC
  Filled 2012-06-17: qty 10

## 2012-06-17 MED ORDER — HEPARIN SOD (PORK) LOCK FLUSH 100 UNIT/ML IV SOLN
500.0000 [IU] | Freq: Once | INTRAVENOUS | Status: AC
  Administered 2012-06-17: 500 [IU] via INTRAVENOUS
  Filled 2012-06-17: qty 5

## 2012-06-17 MED ORDER — HEPARIN SOD (PORK) LOCK FLUSH 100 UNIT/ML IV SOLN
INTRAVENOUS | Status: AC
  Filled 2012-06-17: qty 5

## 2012-06-17 MED ORDER — SODIUM CHLORIDE 0.9 % IJ SOLN
10.0000 mL | INTRAMUSCULAR | Status: DC | PRN
  Administered 2012-06-17: 10 mL via INTRAVENOUS
  Filled 2012-06-17: qty 10

## 2012-06-17 NOTE — Progress Notes (Signed)
[name] presented for Portacath access and flush. Proper placement of portacath confirmed by CXR. Portacath located left chest wall accessed with  H 20 needle. Good blood return present. Portacath flushed with 20ml NS and 500U/5ml Heparin and needle removed intact. Procedure without incident. Patient tolerated procedure well.   

## 2012-06-18 ENCOUNTER — Other Ambulatory Visit (HOSPITAL_COMMUNITY): Payer: BC Managed Care – PPO

## 2012-06-18 LAB — CEA: CEA: 2.3 ng/mL (ref 0.0–5.0)

## 2012-06-19 ENCOUNTER — Encounter (HOSPITAL_BASED_OUTPATIENT_CLINIC_OR_DEPARTMENT_OTHER): Payer: BC Managed Care – PPO | Admitting: Oncology

## 2012-06-19 ENCOUNTER — Encounter (HOSPITAL_COMMUNITY): Payer: Self-pay | Admitting: Oncology

## 2012-06-19 VITALS — BP 127/77 | HR 101 | Temp 98.3°F | Resp 20 | Wt 319.5 lb

## 2012-06-19 DIAGNOSIS — C649 Malignant neoplasm of unspecified kidney, except renal pelvis: Secondary | ICD-10-CM

## 2012-06-19 DIAGNOSIS — I4891 Unspecified atrial fibrillation: Secondary | ICD-10-CM

## 2012-06-19 DIAGNOSIS — C189 Malignant neoplasm of colon, unspecified: Secondary | ICD-10-CM

## 2012-06-19 DIAGNOSIS — C186 Malignant neoplasm of descending colon: Secondary | ICD-10-CM

## 2012-06-19 NOTE — Patient Instructions (Signed)
[facility]  Discharge Instructions  RECOMMENDATIONS MADE BY THE CONSULTANT AND ANY TEST RESULTS WILL BE SENT TO YOUR REFERRING DOCTOR.   SPECIAL INSTRUCTIONS/FOLLOW-UP: Lab work Needed every 3 months, and Return to Clinic to see Dr. Mariel Sleet as scheduled.  Please see the front desk for appointments as you leave today.     I acknowledge that I have been informed and understand all the instructions given to me and received a copy. I do not have any more questions at this time, but understand that I may call the Specialty Clinic at [facility] at [phone] during business hours should I have any further questions or need assistance in obtaining follow-up care.    __________________________________________  _____________  __________ Signature of Patient or Authorized Representative            Date                   Time    __________________________________________ Nurse's Signature

## 2012-06-19 NOTE — Progress Notes (Signed)
Dwayne Obey, MD [address] [place] [address]  1. Colon cancer  CBC with Differential, Comprehensive metabolic panel    CURRENT THERAPY: Observation with annual CT surveillance and every 3 month CEA  INTERVAL HISTORY: [name] 66 y.o. male returns for  regular  visit for followup of Stage III cancer of the colon with 4/32 positive nodes status post surgery on 06/28/2009. He had a 6 cm tumor with 4/32 positive nodes. He is status post chemotherapy with FOLFOX x6 full cycles. He finished all chemotherapy as of 03/20/2010.    I personally reviewed and went over radiographic studies with the patient.  There is no CT evidence of recurrence.  His Ascending thoracic aorta increased caliber is stable compared to last year's exam.   I personally reviewed and went over laboratory results with the patient.  He remains to have a very minimal renal insufficiency.  His CBC is WNL.  His CEA remains stable and WNL at 2.3.   He had some questioning regarding his BP and we talked about his BP medications.  I will defer that to his PCP which he is scheduled to see next week.  I recommended he record his BP reading for the next week until he sees Dr. Sudie Bailey.    Otherwise, oncologically, he is doing well and denies any complaints.  He does have chronic loose stools which is his normal.  He is walking at home and he reported he gets SOB after walking up a large hill.  He has multiple reasons for this including his weight so we discussed weight loss education together.     Past Medical History  Diagnosis Date  . Cancer     colon ca surg/chemo  . Colon cancer   . Diabetes mellitus   . Atrial fibrillation   . Neuropathy     has Colon cancer on his problem list.      has no known allergies.  [name] does not currently have medications on file.  History reviewed. No pertinent past surgical history.  Denies any headaches, dizziness, double vision, fevers, chills,  night sweats, nausea, vomiting, constipation, chest pain, heart palpitations, shortness of breath, blood in stool, black tarry stool, urinary pain, urinary burning, urinary frequency, hematuria.   PHYSICAL EXAMINATION  ECOG PERFORMANCE STATUS: 1 - Symptomatic but completely ambulatory  Filed Vitals:   06/19/12 1100  BP: 127/77  Pulse: 101  Temp: 98.3 F (36.8 C)  Resp: 20    GENERAL:alert, no distress, well nourished, well developed, comfortable, cooperative, obese and smiling SKIN: skin color, texture, turgor are normal, no rashes or significant lesions HEAD: Normocephalic, No masses, lesions, tenderness or abnormalities EYES: normal, Conjunctiva are pink and non-injected EARS: External ears normal OROPHARYNX:lips, buccal mucosa, and tongue normal and mucous membranes are moist  NECK: supple, trachea midline LYMPH:  no palpable lymphadenopathy BREAST:not examined LUNGS: clear to auscultation  HEART: regular rate & rhythm, no murmurs, no gallops, S1 normal and S2 normal ABDOMEN:abdomen soft, non-tender and normal bowel sounds, obese BACK: Back symmetric, no curvature. EXTREMITIES:less then 2 second capillary refill, no joint deformities, or inflammation, no skin discoloration, no clubbing, no cyanosis  NEURO: alert & oriented x 3 with fluent speech, no focal motor/sensory deficits, gait normal   LABORATORY DATA: CBC    Component Value Date/Time   WBC 8.4 06/17/2012 1219   RBC 4.48 06/17/2012 1219   HGB 13.6 06/17/2012 1219   HCT 40.1 06/17/2012 1219   PLT  160 06/17/2012 1219   MCV 89.5 06/17/2012 1219   MCH 30.4 06/17/2012 1219   MCHC 33.9 06/17/2012 1219   RDW 13.4 06/17/2012 1219   LYMPHSABS 0.9 06/17/2012 1219   MONOABS 0.8 06/17/2012 1219   EOSABS 0.1 06/17/2012 1219   BASOSABS 0.0 06/17/2012 1219      Chemistry      Component Value Date/Time   NA 136 06/17/2012 1219   K 4.5 06/17/2012 1219   CL 103 06/17/2012 1219   CO2 24 06/17/2012 1219   BUN 15 06/17/2012 1219    CREATININE 1.49* 06/17/2012 1219      Component Value Date/Time   CALCIUM 9.5 06/17/2012 1219   ALKPHOS 64 06/17/2012 1219   AST 11 06/17/2012 1219   ALT 16 06/17/2012 1219   BILITOT 0.3 06/17/2012 1219       PENDING LABS:   RADIOGRAPHIC STUDIES: 06/16/2012  *RADIOLOGY REPORT*  Clinical Data: Follow up colon cancer. Thoracic dilatation.  CT CHEST, ABDOMEN AND PELVIS WITHOUT CONTRAST  Technique: Multidetector CT imaging of the chest, abdomen and  pelvis was performed following the standard protocol without IV  contrast.  Comparison: 06/17/2011  CT CHEST  Findings: No axillary or supraclavicular adenopathy.  There is no enlarged mediastinal or hilar lymph nodes. No  pericardial or pleural effusion. Calcifications involving the LAD  coronary artery noted. Increased caliber of the ascending thoracic  aorta is again noted. This currently measures 5 x 4.9 cm, image  27. Previously 5.1 x 5.2 cm. No pericardial or pleural effusions.  Lungs are clear. No suspicious pulmonary nodule or mass.  Review of the visualized osseous structures is unremarkable.  IMPRESSION:  1. No evidence for mass or adenopathy.  2. 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  CT ABDOMEN AND PELVIS  Findings: No suspicious liver abnormalities. There are multiple  stones within the dependent portion of the gallbladder which  measure up to 5.4 mm. No gallbladder wall thickening or  pericholecystic fluid. No common bile duct dilatation. The  pancreas appears normal. The spleen is normal.  Previous right nephrectomy. The adrenal glands appear within  normal limits. Exophytic cyst arising from the inferior pole of  the left kidney is intermediate in density measuring 1.2 cm. This  is not significantly changed from previous exam. Urinary bladder  appears normal. The prostate gland is enlarged measuring 5.6 cm in  transverse dimension. Seminal vesicles appear normal.  Stable portocaval lymph node  measuring 1 cm in short axis  dimension, image 63. No upper abdominal adenopathy. There is no  pelvic or inguinal adenopathy.  The stomach appears normal. The small bowel loops appear normal.  The patient has a right lower quadrant colostomy. A Hartmann's  pouch is identified. No ascites identified within the abdomen or  pelvis.  Review of the visualized osseous structures is significant for mild  lumbar spondylosis. No aggressive lytic or sclerotic lesions.  IMPRESSION:  1. Stable exam.  2. No specific features identified to suggest mass or adenopathy.  Original Report Authenticated By: Rosealee Albee, M.D.    ASSESSMENT:  1. Stage III cancer of the colon with 4/32 positive nodes status post surgery on 06/28/2009. He had a 6 cm tumor with 4/32 positive nodes. He is status post chemotherapy with FOLFOX x6 full cycles. He finished all chemotherapy as of 03/20/2010.  2. Right-sided kidney cancer grade 4, clear cell type, 4.3 cm with surgery on 06/28/2009. Margins were clear. No lymph nodes were found in the specimen. Renal vein  was not involved however. He has had no recurrence thus far.  3. Atrial fibrillation on Coumadin managed by Dr. Sudie Bailey.  4. Diabetes mellitus under in between control.  5. Obesity weighing 321 pounds today.  6. Chronic obstructive pulmonary disease status post 2 packs of cigarettes a day for 34 years.  7. 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   PLAN:  1. I personally reviewed and went over laboratory results with the patient. 2. I personally reviewed and went over radiographic studies with the patient. 3. CEA every 3 months 4. CT CAP surveillance annually and will be due in Sept 2014. 5. Lab work in 6 months: CBC diff, CMET, CEA 6. Continue follow-up with PCP for Coumadin management.  7. Return in 6 months for follow-up.  Will defer ordering of surveillance CTs for that follow-up appointment time.    All questions were answered. The patient knows to call the clinic  with any problems, questions or concerns. We can certainly see the patient much sooner if necessary.  The patient and plan discussed with Glenford Peers, MD and he is in agreement with the aforementioned.  KEFALAS,THOMAS

## 2012-07-30 ENCOUNTER — Encounter (HOSPITAL_COMMUNITY): Payer: Medicare Other | Attending: Oncology

## 2012-07-30 DIAGNOSIS — C186 Malignant neoplasm of descending colon: Secondary | ICD-10-CM

## 2012-07-30 DIAGNOSIS — Z95828 Presence of other vascular implants and grafts: Secondary | ICD-10-CM

## 2012-07-30 DIAGNOSIS — Z9889 Other specified postprocedural states: Secondary | ICD-10-CM | POA: Insufficient documentation

## 2012-07-30 DIAGNOSIS — Z452 Encounter for adjustment and management of vascular access device: Secondary | ICD-10-CM

## 2012-07-30 MED ORDER — HEPARIN SOD (PORK) LOCK FLUSH 100 UNIT/ML IV SOLN
500.0000 [IU] | Freq: Once | INTRAVENOUS | Status: AC
  Administered 2012-07-30: 500 [IU] via INTRAVENOUS
  Filled 2012-07-30: qty 5

## 2012-07-30 MED ORDER — SODIUM CHLORIDE 0.9 % IJ SOLN
10.0000 mL | INTRAMUSCULAR | Status: DC | PRN
  Administered 2012-07-30: 10 mL via INTRAVENOUS
  Filled 2012-07-30: qty 10

## 2012-07-30 MED ORDER — HEPARIN SOD (PORK) LOCK FLUSH 100 UNIT/ML IV SOLN
INTRAVENOUS | Status: AC
  Filled 2012-07-30: qty 5

## 2012-07-30 MED ORDER — SODIUM CHLORIDE 0.9 % IJ SOLN
INTRAMUSCULAR | Status: AC
  Filled 2012-07-30: qty 10

## 2012-07-30 NOTE — Progress Notes (Signed)
Lord H Cuadras presented for Portacath access and flush. Proper placement of portacath confirmed by CXR. Portacath located left chest wall accessed with  H 20 needle. Good blood return present. Portacath flushed with 20ml NS and 500U/5ml Heparin and needle removed intact. Procedure without incident. Patient tolerated procedure well.   

## 2012-09-10 ENCOUNTER — Encounter (HOSPITAL_COMMUNITY): Payer: BC Managed Care – PPO

## 2012-09-18 ENCOUNTER — Other Ambulatory Visit (HOSPITAL_COMMUNITY): Payer: BC Managed Care – PPO

## 2012-09-28 ENCOUNTER — Encounter (HOSPITAL_COMMUNITY): Payer: BC Managed Care – PPO | Attending: Oncology

## 2012-09-28 DIAGNOSIS — C189 Malignant neoplasm of colon, unspecified: Secondary | ICD-10-CM | POA: Insufficient documentation

## 2012-09-28 DIAGNOSIS — C186 Malignant neoplasm of descending colon: Secondary | ICD-10-CM

## 2012-09-28 MED ORDER — SODIUM CHLORIDE 0.9 % IJ SOLN
INTRAMUSCULAR | Status: AC
  Filled 2012-09-28: qty 10

## 2012-09-28 MED ORDER — HEPARIN SOD (PORK) LOCK FLUSH 100 UNIT/ML IV SOLN
500.0000 [IU] | Freq: Once | INTRAVENOUS | Status: AC
  Administered 2012-09-28: 500 [IU] via INTRAVENOUS
  Filled 2012-09-28: qty 5

## 2012-09-28 MED ORDER — HEPARIN SOD (PORK) LOCK FLUSH 100 UNIT/ML IV SOLN
INTRAVENOUS | Status: AC
  Filled 2012-09-28: qty 5

## 2012-09-28 MED ORDER — SODIUM CHLORIDE 0.9 % IJ SOLN
10.0000 mL | INTRAMUSCULAR | Status: DC | PRN
  Administered 2012-09-28: 10 mL via INTRAVENOUS
  Filled 2012-09-28: qty 10

## 2012-09-28 NOTE — Addendum Note (Signed)
Addended by: Dennie Maizes on: 09/28/2012 12:03 PM   Modules accepted: Orders

## 2012-09-28 NOTE — Progress Notes (Signed)
[name] presented for Portacath access and flush. Proper placement of portacath confirmed by CXR. Portacath located left chest wall accessed with  H 20 needle. Good blood return present. Portacath flushed with 20ml NS and 500U/5ml Heparin and needle removed intact. Procedure without incident. Patient tolerated procedure well.   

## 2012-11-09 ENCOUNTER — Encounter (HOSPITAL_COMMUNITY): Payer: BC Managed Care – PPO

## 2012-11-16 ENCOUNTER — Encounter (HOSPITAL_COMMUNITY): Payer: Medicare Other | Attending: Oncology

## 2012-11-16 ENCOUNTER — Encounter (HOSPITAL_COMMUNITY): Payer: Self-pay

## 2012-11-16 DIAGNOSIS — Z9889 Other specified postprocedural states: Secondary | ICD-10-CM | POA: Insufficient documentation

## 2012-11-16 DIAGNOSIS — Z95828 Presence of other vascular implants and grafts: Secondary | ICD-10-CM | POA: Insufficient documentation

## 2012-11-16 DIAGNOSIS — C186 Malignant neoplasm of descending colon: Secondary | ICD-10-CM

## 2012-11-16 DIAGNOSIS — Z452 Encounter for adjustment and management of vascular access device: Secondary | ICD-10-CM

## 2012-11-16 MED ORDER — SODIUM CHLORIDE 0.9 % IJ SOLN
10.0000 mL | INTRAMUSCULAR | Status: DC | PRN
  Administered 2012-11-16: 10 mL via INTRAVENOUS
  Filled 2012-11-16: qty 10

## 2012-11-16 MED ORDER — HEPARIN SOD (PORK) LOCK FLUSH 100 UNIT/ML IV SOLN
INTRAVENOUS | Status: AC
  Filled 2012-11-16: qty 5

## 2012-11-16 MED ORDER — HEPARIN SOD (PORK) LOCK FLUSH 100 UNIT/ML IV SOLN
500.0000 [IU] | Freq: Once | INTRAVENOUS | Status: AC
  Administered 2012-11-16: 500 [IU] via INTRAVENOUS
  Filled 2012-11-16: qty 5

## 2012-11-16 NOTE — Progress Notes (Signed)
[name] presented for Portacath access and flush. Proper placement of portacath confirmed by CXR. Portacath located left chest wall accessed with  H 20 needle. Good blood return present. Portacath flushed with 20ml NS and 500U/5ml Heparin and needle removed intact. Procedure without incident. Patient tolerated procedure well.   

## 2012-12-17 ENCOUNTER — Encounter (HOSPITAL_COMMUNITY): Payer: BC Managed Care – PPO | Attending: Oncology

## 2012-12-17 DIAGNOSIS — C649 Malignant neoplasm of unspecified kidney, except renal pelvis: Secondary | ICD-10-CM | POA: Insufficient documentation

## 2012-12-17 DIAGNOSIS — C186 Malignant neoplasm of descending colon: Secondary | ICD-10-CM

## 2012-12-17 DIAGNOSIS — J449 Chronic obstructive pulmonary disease, unspecified: Secondary | ICD-10-CM | POA: Insufficient documentation

## 2012-12-17 DIAGNOSIS — J4489 Other specified chronic obstructive pulmonary disease: Secondary | ICD-10-CM | POA: Insufficient documentation

## 2012-12-17 DIAGNOSIS — C189 Malignant neoplasm of colon, unspecified: Secondary | ICD-10-CM | POA: Insufficient documentation

## 2012-12-17 DIAGNOSIS — Z95828 Presence of other vascular implants and grafts: Secondary | ICD-10-CM

## 2012-12-17 DIAGNOSIS — Z9889 Other specified postprocedural states: Secondary | ICD-10-CM | POA: Insufficient documentation

## 2012-12-17 DIAGNOSIS — Z452 Encounter for adjustment and management of vascular access device: Secondary | ICD-10-CM

## 2012-12-17 LAB — CBC WITH DIFFERENTIAL/PLATELET
Basophils Relative: 0 % (ref 0–1)
Eosinophils Absolute: 0.2 10*3/uL (ref 0.0–0.7)
MCH: 29.9 pg (ref 26.0–34.0)
MCHC: 33.6 g/dL (ref 30.0–36.0)
Neutrophils Relative %: 71 % (ref 43–77)
Platelets: 182 10*3/uL (ref 150–400)
RDW: 13.8 % (ref 11.5–15.5)

## 2012-12-17 LAB — COMPREHENSIVE METABOLIC PANEL
ALT: 17 U/L (ref 0–53)
Albumin: 3.7 g/dL (ref 3.5–5.2)
Alkaline Phosphatase: 61 U/L (ref 39–117)
Potassium: 4 mEq/L (ref 3.5–5.1)
Sodium: 139 mEq/L (ref 135–145)
Total Protein: 6.8 g/dL (ref 6.0–8.3)

## 2012-12-17 LAB — CEA: CEA: 2.2 ng/mL (ref 0.0–5.0)

## 2012-12-17 MED ORDER — HEPARIN SOD (PORK) LOCK FLUSH 100 UNIT/ML IV SOLN
INTRAVENOUS | Status: AC
  Filled 2012-12-17: qty 5

## 2012-12-17 MED ORDER — SODIUM CHLORIDE 0.9 % IJ SOLN
10.0000 mL | INTRAMUSCULAR | Status: DC | PRN
  Administered 2012-12-17: 10 mL via INTRAVENOUS
  Filled 2012-12-17: qty 10

## 2012-12-17 MED ORDER — HEPARIN SOD (PORK) LOCK FLUSH 100 UNIT/ML IV SOLN
500.0000 [IU] | Freq: Once | INTRAVENOUS | Status: AC
  Administered 2012-12-17: 500 [IU] via INTRAVENOUS
  Filled 2012-12-17: qty 5

## 2012-12-17 NOTE — Progress Notes (Signed)
[name] presented for Portacath access and flush. Proper placement of portacath confirmed by CXR. Portacath located left chest wall accessed with  H 20 needle. Good blood return present. Portacath flushed with 20ml NS and 500U/5ml Heparin and needle removed intact. Procedure without incident. Patient tolerated procedure well.   

## 2012-12-18 ENCOUNTER — Encounter (HOSPITAL_BASED_OUTPATIENT_CLINIC_OR_DEPARTMENT_OTHER): Payer: BC Managed Care – PPO | Admitting: Oncology

## 2012-12-18 ENCOUNTER — Ambulatory Visit (HOSPITAL_COMMUNITY): Payer: BC Managed Care – PPO | Admitting: Oncology

## 2012-12-18 VITALS — BP 147/86 | HR 79 | Temp 98.4°F | Resp 18 | Wt 313.7 lb

## 2012-12-18 DIAGNOSIS — C189 Malignant neoplasm of colon, unspecified: Secondary | ICD-10-CM

## 2012-12-18 DIAGNOSIS — N289 Disorder of kidney and ureter, unspecified: Secondary | ICD-10-CM

## 2012-12-18 DIAGNOSIS — J449 Chronic obstructive pulmonary disease, unspecified: Secondary | ICD-10-CM

## 2012-12-18 DIAGNOSIS — C649 Malignant neoplasm of unspecified kidney, except renal pelvis: Secondary | ICD-10-CM

## 2012-12-18 DIAGNOSIS — C779 Secondary and unspecified malignant neoplasm of lymph node, unspecified: Secondary | ICD-10-CM

## 2012-12-18 DIAGNOSIS — C641 Malignant neoplasm of right kidney, except renal pelvis: Secondary | ICD-10-CM

## 2012-12-18 DIAGNOSIS — C186 Malignant neoplasm of descending colon: Secondary | ICD-10-CM

## 2012-12-18 NOTE — Progress Notes (Signed)
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  #3 progressive renal insufficiency and I will get a consultation Dr. Kristian Covey to see if we can optimize any drug therapy to save this kidney function on the left  #4 obesity weighing over 300 pounds on a 6 foot 2 inch frame  #5 diabetes mellitus #6 COPD status post 2 packs of cigarettes a 34 years #7 neurodermatitis #8 atrial fibrillation on Coumadin managed by Dr. Sudie Bailey  He is soon to be 82 in September. He is still weak he states but I suspect most of this is from his weight and his lack of activity.  Oncology review of systems is noncontributory. CAT scan in September was unremarkable but he needs CAT scans again this September and needs CAT scan of his chest with his very strong smoking history and 2 cancers.  BP 147/86  Pulse 79  Temp(Src) 98.4 F (36.9 C) (Oral)  Resp 18  Wt 313 lb 11.2 oz (142.293 kg)  BMI 40.26 kg/m2  He is in no acute distress. His body odor is very strong today. He has no lymphadenopathy. Lungs are clear. He has diminished breath sounds but they are clear. Heart shows an irregularly irregular rhythm but normal rate right around 92. He has a large abdomen without obvious organomegaly. Bowel sounds are normal. He has no peripheral edema. His colostomy is intact. Port-A-Cath is intact in left upper chest as well. He has no leg edema presently.  We will continue monitoring him. We will see him back every 3 months for CEA levels and CAT scans of the chest abdomen and pelvis in September. I do want him to have a  renal function consultation Dr. Kristian Covey because his creatinine is slightly higher than it's been. He is not dehydrated based on his BUN/creatinine ratio. We need to preserve that kidney as possible

## 2012-12-18 NOTE — Patient Instructions (Signed)
[facility] Discharge Instructions  RECOMMENDATIONS MADE BY THE CONSULTANT AND ANY TEST RESULTS WILL BE SENT TO YOUR REFERRING PHYSICIAN.  EXAM FINDINGS BY THE PHYSICIAN TODAY AND SIGNS OR SYMPTOMS TO REPORT TO CLINIC OR PRIMARY PHYSICIAN: Exam findings as discussed by Dr. Mariel Sleet.  SPECIAL INSTRUCTIONS/FOLLOW-UP: 1.  Per Dr. Mariel Sleet, we will be monitoring your CEA (cancer marker) every 3 months.  We will check your CBCd, CMET, and CEA in 6 months. 2.  Your CT has been scheduled for 06/21/13 @ 11am.  CT instructions to follow. 3.  We will coordinate with Dr. Susa Griffins office to get your appointment with them. 4.  Please keep your appointment to see T. Kefalas, PA-C as scheduled in September after CTs and X-ray.  Thank you for choosing [facility] to provide your oncology and hematology care.  To afford each patient quality time with our providers, please arrive at least 15 minutes before your scheduled appointment time.  With your help, our goal is to use those 15 minutes to complete the necessary work-up to ensure our physicians have the information they need to help with your evaluation and healthcare recommendations.    Effective January 1st, 2014, we ask that you re-schedule your appointment with our physicians should you arrive 10 or more minutes late for your appointment.  We strive to give you quality time with our providers, and arriving late affects you and other patients whose appointments are after yours.    Again, thank you for choosing [facility].  Our hope is that these requests will decrease the amount of time that you wait before being seen by our physicians.       _____________________________________________________________  Should you have questions after your visit to [facility], please contact our office at [phone] between the hours of 8:30 a.m. and 5:00 p.m.  Voicemails left after 4:30 p.m. will not be  returned until the following business day.  For prescription refill requests, have your pharmacy contact our office with your prescription refill request.    [facility] Radiology Department  Instructions for CT Abdomen/Pelvis  JAWAN CHAVARRIA, your exam is scheduled for Monday at 11:00  in the morning.  **WARNING** IF YOU ARE ALLERGIC TO IODINE/X-RAY DYE, PLEASE NOTIFY us IMMEDIATELY.  YOU MAY NEED ADDITIONAL PRE-MEDICATIONS THE DAY PRIOR TO YOUR EXAM.   Please follow these instructions on the day of your exam.   Do not eat or drink anything after 0900.  (2 hours prior to the exam time)   You will be given two bottles of ReadiCat oral contrast solution to drink.  The solution may taste better if refrigerated, but do not add ice.  SHAKE WELL BEFORE DRINKING   Drink one bottle of ReadiCat (barium solution) at 0900.  (2 hours prior to your exam)   Drink one bottle of ReadiCat (barium solution) at 1000.  (1 hour prior to your exam)   You may take any medications as prescribed, with a small amount of water, if necessary.   Please arrive 30 minutes prior to appointment time to register.   Come in and report to Short Stay to register.   If registering in any area other than Radiology, notify the Radiology front office upon your arrival in the Radiology Department and they will notify the CT Department.  The purpose of you drinking the oral contrast solution (ReadiCat) is to aid in the visualization of your intestinal tract.  The  contrast solution may cause some diarrhea. Before your exam is started, you will be given a small amount of water to drink.  Depending on your individual set of symptoms, you may also receive an intravenous injection of x-ray contrast/iodine.  Plan on being in Radiology for 30-60 minutes or longer, depending on the type of exam you are having performed.  If you have any questions regarding your procedure, you may call the CT Department at  [phone].

## 2012-12-21 ENCOUNTER — Encounter (HOSPITAL_COMMUNITY): Payer: Self-pay | Admitting: Dietician

## 2012-12-21 NOTE — Progress Notes (Signed)
Pt identified for weight loss on [facility] Nutrition Screen.   Wt Readings from Last 10 Encounters:  12/18/12 313 lb 11.2 oz (142.293 kg)  06/19/12 319 lb 8 oz (144.924 kg)  12/17/11 323 lb (146.512 kg)  07/12/11 317 lb (143.79 kg)  06/19/11 318 lb 6.4 oz (144.425 kg)   Chart reviewed. Pt with stage II colon CA s/p chemotherapy. Noted elevated BUN/Creat levels; nephrology consultation is being arranged. Weight hx indicated UBW of 318#. Pt has lost 10# (3%) x 1 year and lost 6# (1.9%) x 6 months. Weight changes are not significant. Per MD notes, appetite is good, however, pt with decreased activity levels due to weakness.   No recommendations at this time. If nutrition issues arise, please re-consult RD.  Melody Haver, RD, LDN Pager: [phone]

## 2012-12-22 ENCOUNTER — Telehealth (HOSPITAL_COMMUNITY): Payer: Self-pay | Admitting: *Deleted

## 2012-12-22 NOTE — Telephone Encounter (Signed)
Stacy from Dr. Susa Griffins office called and said that Tabor has an appointment with Dr. Rhodia Albright on 01/30/13 at 11:30 AM

## 2012-12-31 ENCOUNTER — Encounter (HOSPITAL_COMMUNITY): Payer: BC Managed Care – PPO

## 2013-01-28 ENCOUNTER — Encounter (HOSPITAL_COMMUNITY): Payer: Medicare Other | Attending: Oncology

## 2013-01-28 DIAGNOSIS — J4489 Other specified chronic obstructive pulmonary disease: Secondary | ICD-10-CM | POA: Insufficient documentation

## 2013-01-28 DIAGNOSIS — J449 Chronic obstructive pulmonary disease, unspecified: Secondary | ICD-10-CM | POA: Insufficient documentation

## 2013-01-28 DIAGNOSIS — C189 Malignant neoplasm of colon, unspecified: Secondary | ICD-10-CM | POA: Insufficient documentation

## 2013-01-28 DIAGNOSIS — Z9889 Other specified postprocedural states: Secondary | ICD-10-CM | POA: Insufficient documentation

## 2013-01-28 DIAGNOSIS — C649 Malignant neoplasm of unspecified kidney, except renal pelvis: Secondary | ICD-10-CM | POA: Insufficient documentation

## 2013-01-28 DIAGNOSIS — Z452 Encounter for adjustment and management of vascular access device: Secondary | ICD-10-CM

## 2013-01-28 DIAGNOSIS — C186 Malignant neoplasm of descending colon: Secondary | ICD-10-CM

## 2013-01-28 DIAGNOSIS — C779 Secondary and unspecified malignant neoplasm of lymph node, unspecified: Secondary | ICD-10-CM

## 2013-01-28 MED ORDER — SODIUM CHLORIDE 0.9 % IJ SOLN
10.0000 mL | INTRAMUSCULAR | Status: DC | PRN
  Administered 2013-01-28: 10 mL via INTRAVENOUS
  Filled 2013-01-28: qty 10

## 2013-01-28 MED ORDER — HEPARIN SOD (PORK) LOCK FLUSH 100 UNIT/ML IV SOLN
500.0000 [IU] | Freq: Once | INTRAVENOUS | Status: AC
  Administered 2013-01-28: 500 [IU] via INTRAVENOUS
  Filled 2013-01-28: qty 5

## 2013-01-28 NOTE — Progress Notes (Signed)
[name] presented for Portacath access and flush.  Proper placement of portacath confirmed by CXR.  Portacath located left chest wall accessed with  H 20 needle.  Good blood return present. Portacath flushed with 20ml NS and 500U/5ml Heparin and needle removed intact.  Procedure tolerated well and without incident.    

## 2013-03-11 ENCOUNTER — Encounter (HOSPITAL_COMMUNITY): Payer: BC Managed Care – PPO | Attending: Oncology

## 2013-03-11 DIAGNOSIS — Z95828 Presence of other vascular implants and grafts: Secondary | ICD-10-CM

## 2013-03-11 DIAGNOSIS — C186 Malignant neoplasm of descending colon: Secondary | ICD-10-CM

## 2013-03-11 DIAGNOSIS — Z9889 Other specified postprocedural states: Secondary | ICD-10-CM | POA: Insufficient documentation

## 2013-03-11 DIAGNOSIS — C189 Malignant neoplasm of colon, unspecified: Secondary | ICD-10-CM | POA: Insufficient documentation

## 2013-03-11 MED ORDER — SODIUM CHLORIDE 0.9 % IJ SOLN
10.0000 mL | Freq: Once | INTRAMUSCULAR | Status: AC
  Administered 2013-03-11: 10 mL via INTRAVENOUS
  Filled 2013-03-11: qty 10

## 2013-03-11 MED ORDER — HEPARIN SOD (PORK) LOCK FLUSH 100 UNIT/ML IV SOLN
INTRAVENOUS | Status: AC
  Filled 2013-03-11: qty 5

## 2013-03-11 MED ORDER — HEPARIN SOD (PORK) LOCK FLUSH 100 UNIT/ML IV SOLN
500.0000 [IU] | Freq: Once | INTRAVENOUS | Status: AC
  Administered 2013-03-11: 500 [IU] via INTRAVENOUS
  Filled 2013-03-11: qty 5

## 2013-03-11 NOTE — Progress Notes (Signed)
[name] presented for Portacath access and flush. Proper placement of portacath confirmed by CXR. Portacath located lt chest wall accessed with  H 20 needle. Good blood return present. Portacath flushed with 20ml NS and 500U/5ml Heparin and needle removed intact. Procedure without incident. Patient tolerated procedure well.   

## 2013-03-19 ENCOUNTER — Other Ambulatory Visit (HOSPITAL_COMMUNITY): Payer: BC Managed Care – PPO

## 2013-04-21 ENCOUNTER — Encounter (HOSPITAL_COMMUNITY): Payer: Medicare Other | Attending: Oncology

## 2013-04-21 DIAGNOSIS — Z9889 Other specified postprocedural states: Secondary | ICD-10-CM | POA: Insufficient documentation

## 2013-04-21 DIAGNOSIS — Z452 Encounter for adjustment and management of vascular access device: Secondary | ICD-10-CM

## 2013-04-21 DIAGNOSIS — C186 Malignant neoplasm of descending colon: Secondary | ICD-10-CM

## 2013-04-21 DIAGNOSIS — C189 Malignant neoplasm of colon, unspecified: Secondary | ICD-10-CM | POA: Insufficient documentation

## 2013-04-21 MED ORDER — HEPARIN SOD (PORK) LOCK FLUSH 100 UNIT/ML IV SOLN
INTRAVENOUS | Status: AC
  Filled 2013-04-21: qty 5

## 2013-04-21 MED ORDER — SODIUM CHLORIDE 0.9 % IJ SOLN
10.0000 mL | INTRAMUSCULAR | Status: DC | PRN
  Administered 2013-04-21: 10 mL via INTRAVENOUS
  Filled 2013-04-21: qty 10

## 2013-04-21 MED ORDER — HEPARIN SOD (PORK) LOCK FLUSH 100 UNIT/ML IV SOLN
500.0000 [IU] | Freq: Once | INTRAVENOUS | Status: AC
  Administered 2013-04-21: 500 [IU] via INTRAVENOUS
  Filled 2013-04-21: qty 5

## 2013-04-21 NOTE — Progress Notes (Signed)
[name] presented for Portacath access and flush.  Proper placement of portacath confirmed by CXR.  Portacath located left chest wall accessed with  H 20 needle.  Good blood return present. Portacath flushed with 20ml NS and 500U/5ml Heparin and needle removed intact.  Procedure tolerated well and without incident.    

## 2013-04-22 ENCOUNTER — Encounter (HOSPITAL_COMMUNITY): Payer: BC Managed Care – PPO

## 2013-05-19 ENCOUNTER — Other Ambulatory Visit (HOSPITAL_COMMUNITY): Payer: BC Managed Care – PPO

## 2013-06-03 ENCOUNTER — Encounter (HOSPITAL_COMMUNITY): Payer: BC Managed Care – PPO

## 2013-06-11 ENCOUNTER — Encounter (HOSPITAL_COMMUNITY): Payer: BC Managed Care – PPO | Attending: Hematology and Oncology

## 2013-06-11 DIAGNOSIS — Z452 Encounter for adjustment and management of vascular access device: Secondary | ICD-10-CM

## 2013-06-11 DIAGNOSIS — Z85528 Personal history of other malignant neoplasm of kidney: Secondary | ICD-10-CM | POA: Insufficient documentation

## 2013-06-11 DIAGNOSIS — C189 Malignant neoplasm of colon, unspecified: Secondary | ICD-10-CM

## 2013-06-11 DIAGNOSIS — C649 Malignant neoplasm of unspecified kidney, except renal pelvis: Secondary | ICD-10-CM | POA: Insufficient documentation

## 2013-06-11 DIAGNOSIS — C641 Malignant neoplasm of right kidney, except renal pelvis: Secondary | ICD-10-CM

## 2013-06-11 DIAGNOSIS — C186 Malignant neoplasm of descending colon: Secondary | ICD-10-CM

## 2013-06-11 DIAGNOSIS — E119 Type 2 diabetes mellitus without complications: Secondary | ICD-10-CM | POA: Insufficient documentation

## 2013-06-11 LAB — CBC WITH DIFFERENTIAL/PLATELET
Eosinophils Absolute: 0.1 10*3/uL (ref 0.0–0.7)
Hemoglobin: 12.8 g/dL — ABNORMAL LOW (ref 13.0–17.0)
Lymphocytes Relative: 14 % (ref 12–46)
Lymphs Abs: 0.9 10*3/uL (ref 0.7–4.0)
MCH: 30.4 pg (ref 26.0–34.0)
MCV: 91.2 fL (ref 78.0–100.0)
Monocytes Relative: 10 % (ref 3–12)
Neutrophils Relative %: 74 % (ref 43–77)
RBC: 4.21 MIL/uL — ABNORMAL LOW (ref 4.22–5.81)

## 2013-06-11 LAB — COMPREHENSIVE METABOLIC PANEL
Alkaline Phosphatase: 59 U/L (ref 39–117)
BUN: 26 mg/dL — ABNORMAL HIGH (ref 6–23)
CO2: 26 mEq/L (ref 19–32)
Chloride: 104 mEq/L (ref 96–112)
GFR calc Af Amer: 51 mL/min — ABNORMAL LOW (ref >90)
GFR calc non Af Amer: 44 mL/min — ABNORMAL LOW (ref >90)
Glucose, Bld: 145 mg/dL — ABNORMAL HIGH (ref 70–99)
Potassium: 4.5 mEq/L (ref 3.5–5.1)
Total Bilirubin: 0.3 mg/dL (ref 0.3–1.2)
Total Protein: 7 g/dL (ref 6.0–8.3)

## 2013-06-11 LAB — CEA: CEA: 3 ng/mL (ref 0.0–5.0)

## 2013-06-11 MED ORDER — SODIUM CHLORIDE 0.9 % IJ SOLN
10.0000 mL | INTRAMUSCULAR | Status: DC | PRN
  Administered 2013-06-11: 10 mL via INTRAVENOUS
  Filled 2013-06-11: qty 10

## 2013-06-11 MED ORDER — HEPARIN SOD (PORK) LOCK FLUSH 100 UNIT/ML IV SOLN
500.0000 [IU] | Freq: Once | INTRAVENOUS | Status: AC
  Administered 2013-06-11: 500 [IU] via INTRAVENOUS
  Filled 2013-06-11: qty 5

## 2013-06-11 NOTE — Progress Notes (Signed)
[name] presented for Portacath access and flush. Proper placement of portacath confirmed by CXR. Portacath located left chest wall accessed with  H 20 needle. Good blood return present. Portacath flushed with 20ml NS and 500U/5ml Heparin and needle removed intact. Procedure without incident. Patient tolerated procedure well.   

## 2013-06-18 ENCOUNTER — Other Ambulatory Visit (HOSPITAL_COMMUNITY): Payer: BC Managed Care – PPO

## 2013-06-19 DIAGNOSIS — Z85528 Personal history of other malignant neoplasm of kidney: Secondary | ICD-10-CM | POA: Insufficient documentation

## 2013-06-21 ENCOUNTER — Ambulatory Visit (HOSPITAL_COMMUNITY)
Admission: RE | Admit: 2013-06-21 | Discharge: 2013-06-21 | Disposition: A | Discharge status: Home / Self Care | Payer: BC Managed Care – PPO | Source: Ambulatory Visit | Attending: Oncology | Admitting: Oncology

## 2013-06-21 ENCOUNTER — Other Ambulatory Visit (HOSPITAL_COMMUNITY): Payer: Self-pay

## 2013-06-21 DIAGNOSIS — C641 Malignant neoplasm of right kidney, except renal pelvis: Secondary | ICD-10-CM

## 2013-06-21 DIAGNOSIS — C189 Malignant neoplasm of colon, unspecified: Secondary | ICD-10-CM

## 2013-06-21 DIAGNOSIS — Z933 Colostomy status: Secondary | ICD-10-CM | POA: Insufficient documentation

## 2013-06-21 DIAGNOSIS — Z9049 Acquired absence of other specified parts of digestive tract: Secondary | ICD-10-CM | POA: Insufficient documentation

## 2013-06-21 DIAGNOSIS — Z85528 Personal history of other malignant neoplasm of kidney: Secondary | ICD-10-CM | POA: Insufficient documentation

## 2013-06-21 DIAGNOSIS — Z85038 Personal history of other malignant neoplasm of large intestine: Secondary | ICD-10-CM | POA: Insufficient documentation

## 2013-06-21 DIAGNOSIS — J449 Chronic obstructive pulmonary disease, unspecified: Secondary | ICD-10-CM

## 2013-06-24 ENCOUNTER — Encounter (HOSPITAL_BASED_OUTPATIENT_CLINIC_OR_DEPARTMENT_OTHER): Payer: BC Managed Care – PPO

## 2013-06-24 ENCOUNTER — Encounter (HOSPITAL_COMMUNITY): Payer: Self-pay

## 2013-06-24 VITALS — BP 130/84 | HR 80 | Temp 97.0°F | Resp 20 | Wt 304.7 lb

## 2013-06-24 DIAGNOSIS — C189 Malignant neoplasm of colon, unspecified: Secondary | ICD-10-CM

## 2013-06-24 DIAGNOSIS — Z23 Encounter for immunization: Secondary | ICD-10-CM

## 2013-06-24 DIAGNOSIS — C649 Malignant neoplasm of unspecified kidney, except renal pelvis: Secondary | ICD-10-CM

## 2013-06-24 DIAGNOSIS — Z85528 Personal history of other malignant neoplasm of kidney: Secondary | ICD-10-CM

## 2013-06-24 DIAGNOSIS — E119 Type 2 diabetes mellitus without complications: Secondary | ICD-10-CM

## 2013-06-24 DIAGNOSIS — C186 Malignant neoplasm of descending colon: Secondary | ICD-10-CM

## 2013-06-24 MED ORDER — INFLUENZA VAC SPLIT QUAD 0.5 ML IM SUSP
0.5000 mL | Freq: Once | INTRAMUSCULAR | Status: AC
  Administered 2013-06-24: 0.5 mL via INTRAMUSCULAR
  Filled 2013-06-24: qty 0.5

## 2013-06-24 NOTE — Patient Instructions (Addendum)
[facility] Discharge Instructions  RECOMMENDATIONS MADE BY THE CONSULTANT AND ANY TEST RESULTS WILL BE SENT TO YOUR REFERRING PHYSICIAN.  Flu vaccine today. Port flush every 6 weeks. Lab in 6 months, CBC Diff, CMET, CEA, LDH, Hgb A1C. CT scans again in 1 year. MD appointment again in 6 months.   Thank you for choosing [facility] to provide your oncology and hematology care.  To afford each patient quality time with our providers, please arrive at least 15 minutes before your scheduled appointment time.  With your help, our goal is to use those 15 minutes to complete the necessary work-up to ensure our physicians have the information they need to help with your evaluation and healthcare recommendations.    Effective January 1st, 2014, we ask that you re-schedule your appointment with our physicians should you arrive 10 or more minutes late for your appointment.  We strive to give you quality time with our providers, and arriving late affects you and other patients whose appointments are after yours.    Again, thank you for choosing [facility].  Our hope is that these requests will decrease the amount of time that you wait before being seen by our physicians.       _____________________________________________________________  Should you have questions after your visit to [facility], please contact our office at [phone] between the hours of 8:30 a.m. and 5:00 p.m.  Voicemails left after 4:30 p.m. will not be returned until the following business day.  For prescription refill requests, have your pharmacy contact our office with your prescription refill request.

## 2013-06-24 NOTE — Progress Notes (Signed)
[name] presents today for injection per MD orders. Fluarix administered IM in right Upper Arm. Administration without incident. Patient tolerated well.

## 2013-06-24 NOTE — Progress Notes (Signed)
[facility] OFFICE PROGRESS NOTE  [name], MD [address] [place] [address]  DIAGNOSIS: Colon cancer - Plan: CBC with Differential, Lactate dehydrogenase, CEA, Comprehensive metabolic panel, CT Chest W Contrast, CT Abdomen Pelvis W Contrast, CT Abdomen W Contrast, CT Abdomen W Contrast, CT Abdomen Pelvis W Contrast, CT Chest W Contrast  History of kidney cancer excluding renal pelvis - Plan: CBC with Differential, Lactate dehydrogenase, CEA, Comprehensive metabolic panel, CT Chest W Contrast, CT Abdomen Pelvis W Contrast, CT Abdomen W Contrast, CT Abdomen W Contrast, CT Abdomen Pelvis W Contrast, CT Chest W Contrast  Chief Complaint  Patient presents with  . Colon Cancer    CURRENT THERAPY: No active treatment at this time the  INTERVAL HISTORY: [name] 67 y.o. male returns for followup of stage III colon cancer and renal cell carcinoma. He underwent surgery on 06/28/2009 46 cm tumor with 4 of 32 lymph nodes positive followed by postoperative chemotherapy with FOLFOX for 6 full cycles finishing therapy on 6/21/2 2011. On 06/28/2009 he underwent right radical nephrectomy for a grade 4 clear cell carcinoma of the right kidney, 4.3 cm in size with clear margins and no evidence of lymph node involvement and no involvement of the renal vein. He continues to do well but does occasionally take sweets despite being a diabetic. Appetite is in good with no nausea, vomiting, diarrhea, constipation, difficulty in manipulating his colostomy, fever, night sweats, lobectomy swelling or redness but with persistent lower extremity paresthesias apparently brought on by oxaliplatin   MEDICAL HISTORY: Past Medical History  Diagnosis Date  . Cancer     colon ca surg/chemo  . Colon cancer   . Diabetes mellitus   . Atrial fibrillation   . Neuropathy   . Port catheter in place 11/16/2012    INTERIM HISTORY: has Colon cancer; Port catheter in place; and  History of kidney cancer excluding renal pelvis on his problem list.    ALLERGIES:  has No Known Allergies.  MEDICATIONS: has a current medication list which includes the following prescription(s): diltiazem, hydrocodone-acetaminophen, metformin, multiple vitamins-minerals, pioglitazone, simvastatin, vitamins-lipotropics, and warfarin.  SURGICAL HISTORY: History reviewed. No pertinent past surgical history.  FAMILY HISTORY: family history includes Cancer in his brother, maternal grandfather, and mother.  SOCIAL HISTORY:  reports that he has quit smoking. He has never used smokeless tobacco. He reports that  drinks alcohol. He reports that he does not use illicit drugs.  REVIEW OF SYSTEMS:  Other than that discussed above is noncontributory.  PHYSICAL EXAMINATION: ECOG PERFORMANCE STATUS: 0 - Asymptomatic  Blood pressure 130/84, pulse 80, temperature 97 F (36.1 C), temperature source Oral, resp. rate 20, weight 304 lb 11.2 oz (138.211 kg).  GENERAL:alert, no distress and comfortable. Moderately obese. SKIN: skin color, texture, turgor are normal, no rashes or significant lesions EYES: normal, Conjunctiva are pink and non-injected, sclera clear OROPHARYNX:no exudate, no erythema and lips, buccal mucosa, and tongue normal  NECK: supple, thyroid normal size, non-tender, without nodularity CHEST: Increased AP diameter with no gynecomastia LYMPH:  no palpable lymphadenopathy in the cervical, axillary or inguinal LUNGS: clear to auscultation and percussion with normal breathing effort HEART: regular rate & rhythm and no murmurs and no lower extremity edema ABDOMEN: Obese and soft with no organomegaly, ascites, or CVA tenderness. Colostomy is in place and with a clear stoma.  Musculoskeletal:no cyanosis of digits and no clubbing  NEURO: alert & oriented x 3 with fluent speech, no focal motor/sensory  deficits. DTRs normal.   LABORATORY DATA: Infusion on 06/11/2013  Component Date Value  Range Status  . WBC 06/11/2013 6.2  4.0 - 10.5 K/uL Final  . RBC 06/11/2013 4.21* 4.22 - 5.81 MIL/uL Final  . Hemoglobin 06/11/2013 12.8* 13.0 - 17.0 g/dL Final  . HCT 16/07/9603 38.4* 39.0 - 52.0 % Final  . MCV 06/11/2013 91.2  78.0 - 100.0 fL Final  . MCH 06/11/2013 30.4  26.0 - 34.0 pg Final  . MCHC 06/11/2013 33.3  30.0 - 36.0 g/dL Final  . RDW 54/05/8118 13.1  11.5 - 15.5 % Final  . Platelets 06/11/2013 183  150 - 400 K/uL Final  . Neutrophils Relative % 06/11/2013 74  43 - 77 % Final  . Neutro Abs 06/11/2013 4.5  1.7 - 7.7 K/uL Final  . Lymphocytes Relative 06/11/2013 14  12 - 46 % Final  . Lymphs Abs 06/11/2013 0.9  0.7 - 4.0 K/uL Final  . Monocytes Relative 06/11/2013 10  3 - 12 % Final  . Monocytes Absolute 06/11/2013 0.6  0.1 - 1.0 K/uL Final  . Eosinophils Relative 06/11/2013 2  0 - 5 % Final  . Eosinophils Absolute 06/11/2013 0.1  0.0 - 0.7 K/uL Final  . Basophils Relative 06/11/2013 1  0 - 1 % Final  . Basophils Absolute 06/11/2013 0.0  0.0 - 0.1 K/uL Final  . Sodium 06/11/2013 138  135 - 145 mEq/L Final  . Potassium 06/11/2013 4.5  3.5 - 5.1 mEq/L Final  . Chloride 06/11/2013 104  96 - 112 mEq/L Final  . CO2 06/11/2013 26  19 - 32 mEq/L Final  . Glucose, Bld 06/11/2013 145* 70 - 99 mg/dL Final  . BUN 14/78/2956 26* 6 - 23 mg/dL Final  . Creatinine, Ser 06/11/2013 1.59* 0.50 - 1.35 mg/dL Final  . Calcium 21/30/8657 9.6  8.4 - 10.5 mg/dL Final  . Total Protein 06/11/2013 7.0  6.0 - 8.3 g/dL Final  . Albumin 84/69/6295 3.7  3.5 - 5.2 g/dL Final  . AST 28/41/3244 12  0 - 37 U/L Final  . ALT 06/11/2013 19  0 - 53 U/L Final  . Alkaline Phosphatase 06/11/2013 59  39 - 117 U/L Final  . Total Bilirubin 06/11/2013 0.3  0.3 - 1.2 mg/dL Final  . GFR calc non Af Amer 06/11/2013 44* >90 mL/min Final  . GFR calc Af Amer 06/11/2013 51* >90 mL/min Final   Comment: (NOTE)                          The eGFR has been calculated using the CKD EPI equation.                          This  calculation has not been validated in all clinical situations.                          eGFR's persistently <90 mL/min signify possible Chronic Kidney                          Disease.  . CEA 06/11/2013 3.0  0.0 - 5.0 ng/mL Final   Performed at [facility]     Urinalysis    Component Value Date/Time   COLORURINE YELLOW 11/11/2009 1235   APPEARANCEUR CLEAR 11/11/2009 1235   LABSPEC >1.030* 11/11/2009 1235   PHURINE 6.0 11/11/2009 1235  GLUCOSEU 250* 11/11/2009 1235   HGBUR SMALL* 11/11/2009 1235   BILIRUBINUR NEGATIVE 11/11/2009 1235   KETONESUR 15* 11/11/2009 1235   PROTEINUR 100* 11/11/2009 1235   UROBILINOGEN 0.2 11/11/2009 1235   NITRITE NEGATIVE 11/11/2009 1235   LEUKOCYTESUR NEGATIVE 11/11/2009 1235    RADIOGRAPHIC STUDIES: Ct Abdomen Pelvis Wo Contrast  06/21/2013   CLINICAL DATA:  Personal history of colon cancer. Prior history of renal cell carcinoma. Subsequent evaluation for colon cancer. Restaging. Patient refuses IV contrast.  EXAM: CT CHEST, ABDOMEN AND PELVIS WITHOUT CONTRAST  TECHNIQUE: Multidetector CT imaging of the chest, abdomen and pelvis was performed following the standard protocol without IV contrast.  COMPARISON:  CT 06/16/2012, 06/15/2010  FINDINGS: CT CHEST FINDINGS  No axillary supraclavicular lymphadenopathy. No mediastinal or hilar lymphadenopathy. No pericardial fluid.  No suspicious pulmonary nodules.  CT ABDOMEN AND PELVIS FINDINGS  Non IV contrast images demonstrate no focal hepatic lesion. Several gallstones are in the gallbladder lumen. The pancreas, spleen, and left adrenal gland are normal. The left kidney has a small 13 mm exophytic lesion extending from the lower pole is unchanged from prior. Patient status post right nephrectomy and adrenalectomy. No evidence of nodularity nephrectomy bed.  Stomach, small bowel, and cecum are normal. There is a right lower quadrant colostomy. Patient status post left hemicolectomy. The sigmoid pouch is normal.   Abdominal aorta is normal in caliber. No retroperitoneal or periportal lymphadenopathy. No peritoneal disease or omental disease evident.  The prostate gland and bladder normal. No pelvic lymphadenopathy. Review of bone windows demonstrates no aggressive osseous lesions.  IMPRESSION: CT CHEST IMPRESSION  No evidence of thoracic metastasis.  CT ABDOMEN AND PELVIS IMPRESSION  1. No evidence of colon cancer recurrence. Right lower quadrant colostomy.  2. No evidence of renal cell cancer recurrence in the right nephrectomy bed.  3. Stable small exophytic lesion from the inferior pole of the left kidney is incompletely characterized but similar in size to 2011 exam.   Electronically Signed   By: Genevive Bi M.D.   On: 06/21/2013 12:52   Ct Chest Wo Contrast  06/21/2013   CLINICAL DATA:  Personal history of colon cancer. Prior history of renal cell carcinoma. Subsequent evaluation for colon cancer. Restaging. Patient refuses IV contrast.  EXAM: CT CHEST, ABDOMEN AND PELVIS WITHOUT CONTRAST  TECHNIQUE: Multidetector CT imaging of the chest, abdomen and pelvis was performed following the standard protocol without IV contrast.  COMPARISON:  CT 06/16/2012, 06/15/2010  FINDINGS: CT CHEST FINDINGS  No axillary supraclavicular lymphadenopathy. No mediastinal or hilar lymphadenopathy. No pericardial fluid.  No suspicious pulmonary nodules.  CT ABDOMEN AND PELVIS FINDINGS  Non IV contrast images demonstrate no focal hepatic lesion. Several gallstones are in the gallbladder lumen. The pancreas, spleen, and left adrenal gland are normal. The left kidney has a small 13 mm exophytic lesion extending from the lower pole is unchanged from prior. Patient status post right nephrectomy and adrenalectomy. No evidence of nodularity nephrectomy bed.  Stomach, small bowel, and cecum are normal. There is a right lower quadrant colostomy. Patient status post left hemicolectomy. The sigmoid pouch is normal.  Abdominal aorta is normal in  caliber. No retroperitoneal or periportal lymphadenopathy. No peritoneal disease or omental disease evident.  The prostate gland and bladder normal. No pelvic lymphadenopathy. Review of bone windows demonstrates no aggressive osseous lesions.  IMPRESSION: CT CHEST IMPRESSION  No evidence of thoracic metastasis.  CT ABDOMEN AND PELVIS IMPRESSION  1. No evidence of colon cancer recurrence. Right lower  quadrant colostomy.  2. No evidence of renal cell cancer recurrence in the right nephrectomy bed.  3. Stable small exophytic lesion from the inferior pole of the left kidney is incompletely characterized but similar in size to 2011 exam.   Electronically Signed   By: Genevive Bi M.D.   On: 06/21/2013 12:52    ASSESSMENT: #1. Stage III carcinoma the colon, no evidence of disease, status post surgery on 06/28/2009 with 4 nodes positive, status post chemotherapy with FOLFOX x6 finishing on 03/20/2010. 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  #3 progressive renal insufficiency and I will get a consultation Dr. Kristian Covey to see if we can optimize any drug therapy to save this kidney function on the left  #4 obesity weighing over 300 pounds on a 6 foot 2 inch frame  #5 diabetes mellitus  #6 COPD status post 2 packs of cigarettes a 34 years  #7 neurodermatitis  #8 atrial fibrillation on Coumadin managed by Dr. Sudie Bailey      PLAN: #1. Influenza virus vaccine. #2. Avoid NSAIDs to preserve remaining renal function. #3. More meticulous control of blood sugar. #4. Followup in 6 months with lab tests and physical exam   All questions were answered. The patient knows to call the clinic with any problems, questions or concerns. We can certainly see the patient much sooner if necessary.   I spent 40 minutes counseling  the patient face to face. The total time spent in the appointment was 40 minutes.    Maurilio Lovely, MD 06/24/2013 9:38 AM

## 2013-06-25 ENCOUNTER — Ambulatory Visit (HOSPITAL_COMMUNITY): Payer: BC Managed Care – PPO | Admitting: Oncology

## 2013-07-23 ENCOUNTER — Encounter (HOSPITAL_COMMUNITY): Payer: BC Managed Care – PPO

## 2013-07-28 ENCOUNTER — Encounter (HOSPITAL_COMMUNITY): Payer: BC Managed Care – PPO

## 2013-07-29 ENCOUNTER — Encounter (HOSPITAL_COMMUNITY): Payer: BC Managed Care – PPO | Attending: Hematology and Oncology

## 2013-07-29 DIAGNOSIS — Z452 Encounter for adjustment and management of vascular access device: Secondary | ICD-10-CM

## 2013-07-29 DIAGNOSIS — C186 Malignant neoplasm of descending colon: Secondary | ICD-10-CM

## 2013-07-29 MED ORDER — HEPARIN SOD (PORK) LOCK FLUSH 100 UNIT/ML IV SOLN
500.0000 [IU] | Freq: Once | INTRAVENOUS | Status: AC
  Administered 2013-07-29: 500 [IU] via INTRAVENOUS
  Filled 2013-07-29: qty 5

## 2013-07-29 MED ORDER — SODIUM CHLORIDE 0.9 % IJ SOLN
10.0000 mL | INTRAMUSCULAR | Status: DC | PRN
  Administered 2013-07-29: 10 mL via INTRAVENOUS

## 2013-07-29 NOTE — Progress Notes (Signed)
Brace H Standifer presented for Portacath access and flush. Proper placement of portacath confirmed by CXR. Portacath located left chest wall accessed with  H 20 needle. Good blood return present. Portacath flushed with 20ml NS and 500U/5ml Heparin and needle removed intact. Procedure without incident. Patient tolerated procedure well.   

## 2013-08-03 ENCOUNTER — Emergency Department (HOSPITAL_COMMUNITY): Payer: BC Managed Care – PPO

## 2013-08-03 ENCOUNTER — Encounter (HOSPITAL_COMMUNITY): Payer: Self-pay | Admitting: Emergency Medicine

## 2013-08-03 ENCOUNTER — Emergency Department (HOSPITAL_COMMUNITY)
Admission: EM | Admit: 2013-08-03 | Discharge: 2013-08-03 | Disposition: A | Discharge status: Home / Self Care | Payer: BC Managed Care – PPO | Attending: Emergency Medicine | Admitting: Emergency Medicine

## 2013-08-03 DIAGNOSIS — Z9889 Other specified postprocedural states: Secondary | ICD-10-CM | POA: Insufficient documentation

## 2013-08-03 DIAGNOSIS — E119 Type 2 diabetes mellitus without complications: Secondary | ICD-10-CM | POA: Insufficient documentation

## 2013-08-03 DIAGNOSIS — Z87891 Personal history of nicotine dependence: Secondary | ICD-10-CM | POA: Insufficient documentation

## 2013-08-03 DIAGNOSIS — Z8669 Personal history of other diseases of the nervous system and sense organs: Secondary | ICD-10-CM | POA: Insufficient documentation

## 2013-08-03 DIAGNOSIS — K8020 Calculus of gallbladder without cholecystitis without obstruction: Secondary | ICD-10-CM | POA: Insufficient documentation

## 2013-08-03 DIAGNOSIS — Z85038 Personal history of other malignant neoplasm of large intestine: Secondary | ICD-10-CM | POA: Insufficient documentation

## 2013-08-03 DIAGNOSIS — Z79899 Other long term (current) drug therapy: Secondary | ICD-10-CM | POA: Insufficient documentation

## 2013-08-03 DIAGNOSIS — Z7901 Long term (current) use of anticoagulants: Secondary | ICD-10-CM | POA: Insufficient documentation

## 2013-08-03 DIAGNOSIS — I4891 Unspecified atrial fibrillation: Secondary | ICD-10-CM | POA: Insufficient documentation

## 2013-08-03 DIAGNOSIS — K8050 Calculus of bile duct without cholangitis or cholecystitis without obstruction: Secondary | ICD-10-CM

## 2013-08-03 HISTORY — DX: Thoracic aortic aneurysm, without rupture: I71.2

## 2013-08-03 HISTORY — DX: Thoracic aortic aneurysm, without rupture, unspecified: I71.20

## 2013-08-03 LAB — URINALYSIS, ROUTINE W REFLEX MICROSCOPIC
Leukocytes, UA: NEGATIVE
Nitrite: NEGATIVE
Specific Gravity, Urine: 1.015 (ref 1.005–1.030)
pH: 8 (ref 5.0–8.0)

## 2013-08-03 LAB — COMPREHENSIVE METABOLIC PANEL
Albumin: 3.9 g/dL (ref 3.5–5.2)
BUN: 11 mg/dL (ref 6–23)
Calcium: 9.9 mg/dL (ref 8.4–10.5)
Creatinine, Ser: 1.59 mg/dL — ABNORMAL HIGH (ref 0.50–1.35)
Total Protein: 7.2 g/dL (ref 6.0–8.3)

## 2013-08-03 LAB — PROTIME-INR
INR: 2.58 — ABNORMAL HIGH (ref 0.00–1.49)
Prothrombin Time: 26.8 seconds — ABNORMAL HIGH (ref 11.6–15.2)

## 2013-08-03 LAB — CBC WITH DIFFERENTIAL/PLATELET
Basophils Relative: 0 % (ref 0–1)
Eosinophils Absolute: 0.1 10*3/uL (ref 0.0–0.7)
Eosinophils Relative: 1 % (ref 0–5)
HCT: 40.4 % (ref 39.0–52.0)
Hemoglobin: 13.4 g/dL (ref 13.0–17.0)
Lymphs Abs: 0.7 10*3/uL (ref 0.7–4.0)
MCH: 30.7 pg (ref 26.0–34.0)
MCHC: 33.2 g/dL (ref 30.0–36.0)
Monocytes Absolute: 0.5 10*3/uL (ref 0.1–1.0)
Monocytes Relative: 5 % (ref 3–12)
Neutrophils Relative %: 88 % — ABNORMAL HIGH (ref 43–77)

## 2013-08-03 LAB — LIPASE, BLOOD: Lipase: 27 U/L (ref 11–59)

## 2013-08-03 LAB — TROPONIN I: Troponin I: <0.3 ng/mL (ref <0.30)

## 2013-08-03 MED ORDER — SODIUM CHLORIDE 0.9 % IV SOLN: INTRAVENOUS | Status: DC

## 2013-08-03 MED ORDER — IOHEXOL 300 MG/ML  SOLN
50.0000 mL | Freq: Once | INTRAMUSCULAR | Status: AC | PRN
  Administered 2013-08-03: 50 mL via ORAL

## 2013-08-03 NOTE — ED Notes (Signed)
Pt c/o upper abd pain that started this am with diaphoresis, nausea. States that he has had this time of pain before when he had eaten almonds and had a blockage, recently had oral surgery, has been eating "soft" diet, denies any problems with stools, has colostomy bag to right  quad of abd. States that he is feeling better after arrival to er,

## 2013-08-03 NOTE — ED Notes (Signed)
Pt waiting to be reeval and disposition 

## 2013-08-03 NOTE — ED Provider Notes (Signed)
CSN: [id number]     Arrival date & time 08/03/13  1314 History   First MD Initiated Contact with Patient 08/03/13 1401     Chief Complaint  Patient presents with  . Abdominal Pain   (Consider location/radiation/quality/duration/timing/severity/associated sxs/prior Treatment) HPI Comments: Patient presents to the ER for evaluation of abdominal pain. Patient reports that he awakened this morning with pain in the center of his upper abdomen. Patient reports that the pain was constant and severe. No nausea, vomiting. He does, however, report that he has no stool out from his colostomy since late last night. Patient reports that the pain he experienced this morning is similar to when he had a bowel obstruction. He does, however, reports that the pain is only a slight ache now.  Patient is a 67 y.o. male presenting with abdominal pain.  Abdominal Pain   Past Medical History  Diagnosis Date  . Cancer     colon ca surg/chemo  . Colon cancer   . Diabetes mellitus   . Atrial fibrillation   . Neuropathy   . Port catheter in place 11/16/2012  . Thoracic aortic aneurysm    Past Surgical History  Procedure Laterality Date  . Colon surgery    . Kidney surgery     Family History  Problem Relation Age of Onset  . Cancer Mother   . Cancer Brother   . Cancer Maternal Grandfather    History  Substance Use Topics  . Smoking status: Former Games developer  . Smokeless tobacco: Never Used  . Alcohol Use: Yes     Comment: occasional gin and tonic    Review of Systems  Gastrointestinal: Positive for abdominal pain.  All other systems reviewed and are negative.    Allergies  Review of patient's allergies indicates no known allergies.  Home Medications   Current Outpatient Rx  Name  Route  Sig  Dispense  Refill  . diltiazem (CARDIZEM) 60 MG tablet   Oral   Take 60 mg by mouth 2 (two) times daily.          Marland Kitchen HYDROcodone-acetaminophen (VICODIN) 5-500 MG per tablet   Oral   Take 1 tablet by  mouth every 6 (six) hours as needed for pain.          . metFORMIN (GLUCOPHAGE) 1000 MG tablet   Oral   Take 1,000 mg by mouth 2 (two) times daily with a meal.           . Multiple Vitamins-Minerals (COMPLETE MULTIVITAMIN/MINERAL PO)   Oral   Take 1 tablet by mouth daily.         . pioglitazone (ACTOS) 45 MG tablet   Oral   Take 45 mg by mouth daily.           . simvastatin (ZOCOR) 40 MG tablet   Oral   Take 40 mg by mouth every other day.          . Vitamins-Lipotropics (EAR HEALTH PLUS PO)   Oral   Take 1 tablet by mouth daily.         Marland Kitchen warfarin (COUMADIN) 5 MG tablet   Oral   Take 5-10 mg by mouth See admin instructions. Take 5 mg for 6 days then on day 7 take 10 mg          BP 118/77  Pulse 87  Temp(Src) 97.9 F (36.6 C) (Oral)  Ht 6\' 2"  (1.88 m)  Wt 302 lb (136.986 kg)  BMI 38.76 kg/m2  SpO2 96% Physical Exam  Constitutional: He is oriented to person, place, and time. He appears well-developed and well-nourished. No distress.  HENT:  Head: Normocephalic and atraumatic.  Right Ear: Hearing normal.  Left Ear: Hearing normal.  Nose: Nose normal.  Mouth/Throat: Oropharynx is clear and moist and mucous membranes are normal.  Eyes: Conjunctivae and EOM are normal. Pupils are equal, round, and reactive to light.  Neck: Normal range of motion. Neck supple.  Cardiovascular: Regular rhythm, S1 normal and S2 normal.  Exam reveals no gallop and no friction rub.   No murmur heard. Pulmonary/Chest: Effort normal and breath sounds normal. No respiratory distress. He exhibits no tenderness.  Abdominal: Soft. Normal appearance and bowel sounds are normal. There is no hepatosplenomegaly. There is tenderness in the epigastric area. There is no rebound, no guarding, no tenderness at McBurney's point and negative Murphy's sign. No hernia.  Musculoskeletal: Normal range of motion.  Neurological: He is alert and oriented to person, place, and time. He has normal  strength. No cranial nerve deficit or sensory deficit. Coordination normal. GCS eye subscore is 4. GCS verbal subscore is 5. GCS motor subscore is 6.  Skin: Skin is warm, dry and intact. No rash noted. No cyanosis.  Psychiatric: He has a normal mood and affect. His speech is normal and behavior is normal. Thought content normal.    ED Course  Procedures (including critical care time) Labs Review Labs Reviewed  CBC WITH DIFFERENTIAL - Abnormal; Notable for the following:    WBC 11.0 (*)    Neutrophils Relative % 88 (*)    Neutro Abs 9.7 (*)    Lymphocytes Relative 7 (*)    All other components within normal limits  COMPREHENSIVE METABOLIC PANEL - Abnormal; Notable for the following:    Potassium 5.3 (*)    Glucose, Bld 196 (*)    Creatinine, Ser 1.59 (*)    AST 101 (*)    ALT 78 (*)    GFR calc non Af Amer 44 (*)    GFR calc Af Amer 51 (*)    All other components within normal limits  URINALYSIS, ROUTINE W REFLEX MICROSCOPIC - Abnormal; Notable for the following:    Ketones, ur TRACE (*)    All other components within normal limits  PROTIME-INR - Abnormal; Notable for the following:    Prothrombin Time 26.8 (*)    INR 2.58 (*)    All other components within normal limits  TROPONIN I  LIPASE, BLOOD   Imaging Review Ct Abdomen Pelvis Wo Contrast  08/03/2013   CLINICAL DATA:  Abdominal pain. History of small bowel obstruction. History of colonic malignancy with colostomy. The patient is status post right nephrectomy for malignancy. Marland Kitchen  EXAM: CT ABDOMEN AND PELVIS WITHOUT CONTRAST  TECHNIQUE: Multidetector CT imaging of the abdomen and pelvis was performed following the standard protocol without intravenous contrast.  COMPARISON:  June 21, 2013.  FINDINGS: The liver exhibits no focal mass nor ductal dilation. The gallbladder is mildly distended and contains multiple calcified stones similar to those previously demonstrated. There is no pericholecystic fluid. There may be minimal  gallbladder wall thickening. The pancreas, spleen, nondistended stomach, adrenal glands, and left kidney exhibit no acute abnormalities. There is a stable exophytic lower pole soft tissue density structure measuring approximately 1.4 cm in diameter. It exhibits Hounsfield measurement of +6 and is most compatible with a cyst. Stable increased density in the perinephric fat is present. The caliber of the abdominal aorta is normal.  There is an ostomy on the right given the patient's previous left colectomy. There is herniation of mesenteric fat into the ostomy site. There is no evidence of a small or large bowel obstruction. There is no free fluid in the abdomen nor pelvis. The partially distended urinary bladder is normal in appearance. The prostate gland is mildly enlarged and produces an impression upon the urinary bladder base. There is no inguinal or significant of umbilical hernia.  The lumbar vertebral bodies are preserved in height. There is disk space narrowing and vacuum phenomenon at L4-5 and L5-S1. The lung bases are clear.  IMPRESSION: 1. There is no evidence of a small or large bowel obstruction. No bowel masses demonstrated. The colostomy is normal in appearance though there is herniation of abdominal fat through the ostomy defect which is not clearly resulting in obstruction currently. 2. There is a stable lower pole exophytic hypodensity involving the left kidney. 3. There are stable gallstones present. The gallbladder appears slightly more distended today and there is subjective minimal wall thickening. Further evaluation of the gallbladder with ultrasound is recommended.   Electronically Signed   By: David  [place]   On: 08/03/2013 17:07   US Abdomen Limited Ruq  08/03/2013   CLINICAL DATA:  Upper abdominal pain. History of right nephrectomy for cancer.  EXAM: US ABDOMEN LIMITED - RIGHT UPPER QUADRANT  COMPARISON:  Abdominal CT 08/03/2013.  FINDINGS: Gallbladder  Multiple small gallstones are  noted as well as sludge. There is mild gallbladder wall thickening to 3.7 mm. Sonographic Murphy's sign is absent.  Common bile duct  Diameter: 5.1 mm. No evidence of intraductal calculus.  Liver:  The hepatic echogenicity is diffusely increased, corresponding with steatosis on the CT. No focal lesions identified.  IMPRESSION: 1. Cholelithiasis with mild gallbladder wall thickening and sludge. Early cholecystitis cannot be excluded, although no sonographic Murphy sign demonstrated. 2. No biliary dilatation. Hepatic steatosis.   Electronically Signed   By: Roxy Horseman M.D.   On: 08/03/2013 18:10    EKG Interpretation     Ventricular Rate:  93 PR Interval:    QRS Duration: 100 QT Interval:  368 QTC Calculation: 457 R Axis:   56 Text Interpretation:  Atrial fibrillation Abnormal ECG When compared with ECG of 11-Nov-2009 22:59, Vent. rate has decreased BY  53 BPM            MDM  Diagnosis: Biliary colic  Patient presents to the ER for evaluation of abdominal pain. Patient reports that he had severe upper, pain onset earlier today, but this pain is slowly improved and is now resolved. He had no concomitant nausea, vomiting or diarrhea. Patient does report decreased output into his colostomy today. His history of colostomy secondary to colon cancer. Patient does report a previous small bowel obstruction felt similar. CAT scan with oral contrast but no IV contrast because of his renal history, shows no evidence of obstruction. There are gallstones noted. Ultrasound performed, does have a slightly thickened gallbladder wall without Murphy sign. The patient's transaminases are slightly elevated, but no elevation of alkaline phosphatase or bilirubin. Case discussed with Doctor Malvin Johns, on call for general surgery. He did agree that the patient be discharged to followup with Doctor Lovell Sheehan, his surgeon, in the office. Strict clear liquid diet explained to the patient. He will return to the ER for  recurrent pain.    Gilda Crease, MD 08/03/13 1905

## 2013-08-03 NOTE — ED Notes (Signed)
Pt states abdominal pain began this morning to epigastric area. Described as an "intense ache". Last time he had same pain, he had eaten a lot of almonds which he states caused a blockage. Pt states pain is similar this time.

## 2013-08-03 NOTE — ED Notes (Signed)
US being done at bedside

## 2013-08-03 NOTE — ED Notes (Signed)
Ct abd with contrast order changed to po contrast only per Dr. Blima Dessert in CT notified of change,

## 2013-09-03 ENCOUNTER — Encounter (HOSPITAL_COMMUNITY): Payer: BC Managed Care – PPO

## 2013-09-09 ENCOUNTER — Encounter (HOSPITAL_COMMUNITY): Payer: BC Managed Care – PPO | Attending: Hematology and Oncology

## 2013-09-09 DIAGNOSIS — C189 Malignant neoplasm of colon, unspecified: Secondary | ICD-10-CM

## 2013-09-09 DIAGNOSIS — Z452 Encounter for adjustment and management of vascular access device: Secondary | ICD-10-CM

## 2013-09-09 MED ORDER — HEPARIN SOD (PORK) LOCK FLUSH 100 UNIT/ML IV SOLN
500.0000 [IU] | Freq: Once | INTRAVENOUS | Status: AC
  Administered 2013-09-09: 500 [IU] via INTRAVENOUS

## 2013-09-09 MED ORDER — SODIUM CHLORIDE 0.9 % IJ SOLN
10.0000 mL | INTRAMUSCULAR | Status: DC | PRN
  Administered 2013-09-09: 10 mL via INTRAVENOUS

## 2013-09-09 MED ORDER — HEPARIN SOD (PORK) LOCK FLUSH 100 UNIT/ML IV SOLN: 500.0000 [IU] | Freq: Once | INTRAVENOUS | Status: DC

## 2013-09-09 MED ORDER — SODIUM CHLORIDE 0.9 % IJ SOLN: 10.0000 mL | INTRAMUSCULAR | Status: DC | PRN

## 2013-09-09 NOTE — Progress Notes (Signed)
[name] presented for Portacath access and flush. Proper placement of portacath confirmed by CXR. Portacath located left chest wall accessed with  H 20 needle. Good blood return present. Portacath flushed with 20ml NS and 500U/5ml Heparin and needle removed intact. Procedure without incident. Patient tolerated procedure well.   

## 2013-09-10 LAB — CEA: CEA: 1.4 ng/mL (ref 0.0–5.0)

## 2013-10-15 ENCOUNTER — Encounter (HOSPITAL_COMMUNITY): Payer: Medicare Other | Attending: Hematology and Oncology

## 2013-10-15 DIAGNOSIS — Z452 Encounter for adjustment and management of vascular access device: Secondary | ICD-10-CM

## 2013-10-15 DIAGNOSIS — C186 Malignant neoplasm of descending colon: Secondary | ICD-10-CM

## 2013-10-15 DIAGNOSIS — C189 Malignant neoplasm of colon, unspecified: Secondary | ICD-10-CM | POA: Insufficient documentation

## 2013-10-15 MED ORDER — HEPARIN SOD (PORK) LOCK FLUSH 100 UNIT/ML IV SOLN
500.0000 [IU] | Freq: Once | INTRAVENOUS | Status: AC
  Administered 2013-10-15: 500 [IU] via INTRAVENOUS

## 2013-10-15 MED ORDER — SODIUM CHLORIDE 0.9 % IJ SOLN
10.0000 mL | INTRAMUSCULAR | Status: DC | PRN
  Administered 2013-10-15: 10 mL via INTRAVENOUS

## 2013-10-15 MED ORDER — HEPARIN SOD (PORK) LOCK FLUSH 100 UNIT/ML IV SOLN
INTRAVENOUS | Status: AC
  Filled 2013-10-15: qty 5

## 2013-10-15 NOTE — Progress Notes (Signed)
[name] presented for Portacath access and flush. Portacath located lt chest wall accessed with  H 20 needle. Good blood return present. Portacath flushed with 64ml NS and 500U/36ml Heparin and needle removed intact. Procedure without incident. Patient tolerated procedure well.

## 2013-11-26 ENCOUNTER — Encounter (HOSPITAL_COMMUNITY): Payer: Medicare Other | Attending: Hematology and Oncology

## 2013-11-26 DIAGNOSIS — C186 Malignant neoplasm of descending colon: Secondary | ICD-10-CM

## 2013-11-26 DIAGNOSIS — Z452 Encounter for adjustment and management of vascular access device: Secondary | ICD-10-CM

## 2013-11-26 DIAGNOSIS — C189 Malignant neoplasm of colon, unspecified: Secondary | ICD-10-CM

## 2013-11-26 DIAGNOSIS — Z85528 Personal history of other malignant neoplasm of kidney: Secondary | ICD-10-CM | POA: Insufficient documentation

## 2013-11-26 DIAGNOSIS — E119 Type 2 diabetes mellitus without complications: Secondary | ICD-10-CM

## 2013-11-26 LAB — COMPREHENSIVE METABOLIC PANEL
ALBUMIN: 3.9 g/dL (ref 3.5–5.2)
ALK PHOS: 63 U/L (ref 39–117)
ALT: 15 U/L (ref 0–53)
AST: 11 U/L (ref 0–37)
BILIRUBIN TOTAL: 0.4 mg/dL (ref 0.3–1.2)
BUN: 19 mg/dL (ref 6–23)
CHLORIDE: 103 meq/L (ref 96–112)
CO2: 26 mEq/L (ref 19–32)
Calcium: 9.6 mg/dL (ref 8.4–10.5)
Creatinine, Ser: 1.36 mg/dL — ABNORMAL HIGH (ref 0.50–1.35)
GFR calc Af Amer: 61 mL/min — ABNORMAL LOW (ref >90)
GFR calc non Af Amer: 53 mL/min — ABNORMAL LOW (ref >90)
Glucose, Bld: 110 mg/dL — ABNORMAL HIGH (ref 70–99)
POTASSIUM: 4.6 meq/L (ref 3.7–5.3)
Sodium: 140 mEq/L (ref 137–147)
Total Protein: 7.5 g/dL (ref 6.0–8.3)

## 2013-11-26 LAB — CBC WITH DIFFERENTIAL/PLATELET
BASOS ABS: 0 10*3/uL (ref 0.0–0.1)
Basophils Relative: 1 % (ref 0–1)
Eosinophils Absolute: 0.1 10*3/uL (ref 0.0–0.7)
Eosinophils Relative: 2 % (ref 0–5)
HEMATOCRIT: 39.6 % (ref 39.0–52.0)
Hemoglobin: 13 g/dL (ref 13.0–17.0)
LYMPHS ABS: 0.9 10*3/uL (ref 0.7–4.0)
Lymphocytes Relative: 16 % (ref 12–46)
MCH: 29.5 pg (ref 26.0–34.0)
MCHC: 32.8 g/dL (ref 30.0–36.0)
MCV: 90 fL (ref 78.0–100.0)
Monocytes Absolute: 0.6 10*3/uL (ref 0.1–1.0)
Monocytes Relative: 10 % (ref 3–12)
NEUTROS ABS: 4.2 10*3/uL (ref 1.7–7.7)
Neutrophils Relative %: 71 % (ref 43–77)
Platelets: 215 10*3/uL (ref 150–400)
RBC: 4.4 MIL/uL (ref 4.22–5.81)
RDW: 14.2 % (ref 11.5–15.5)
WBC: 5.8 10*3/uL (ref 4.0–10.5)

## 2013-11-26 LAB — LACTATE DEHYDROGENASE: LDH: 167 U/L (ref 94–250)

## 2013-11-26 MED ORDER — SODIUM CHLORIDE 0.9 % IJ SOLN
10.0000 mL | INTRAMUSCULAR | Status: DC | PRN
  Administered 2013-11-26: 10 mL via INTRAVENOUS

## 2013-11-26 MED ORDER — HEPARIN SOD (PORK) LOCK FLUSH 100 UNIT/ML IV SOLN
500.0000 [IU] | Freq: Once | INTRAVENOUS | Status: AC
  Administered 2013-11-26: 500 [IU] via INTRAVENOUS
  Filled 2013-11-26: qty 5

## 2013-11-26 NOTE — Progress Notes (Signed)
[name] presented for Portacath access and flush. Proper placement of portacath confirmed by CXR. Portacath located lt chest wall accessed with  H 20 needle. Good blood return present. Portacath flushed with 56ml NS and 500U/25ml Heparin and needle removed intact. Procedure without incident. Patient tolerated procedure well.

## 2013-11-27 LAB — HEMOGLOBIN A1C
Hgb A1c MFr Bld: 6 % — ABNORMAL HIGH (ref <5.7)
Mean Plasma Glucose: 126 mg/dL — ABNORMAL HIGH (ref <117)

## 2013-11-27 LAB — CEA: CEA: 1.7 ng/mL (ref 0.0–5.0)

## 2013-12-20 ENCOUNTER — Encounter (HOSPITAL_COMMUNITY): Payer: Self-pay | Admitting: Oncology

## 2013-12-20 NOTE — Progress Notes (Signed)
Dwayne Bellow, MD [name] [id number]  Adenocarcinoma of colon - Plan: CBC with Differential, Comprehensive metabolic panel, CEA, CT Abdomen Pelvis W Contrast  History of kidney cancer excluding renal pelvis - Plan: CBC with Differential, Comprehensive metabolic panel, CT Abdomen Pelvis W Contrast  CURRENT THERAPY: Surveillance per NCCN guidelines  INTERVAL HISTORY: [name] 68 y.o. male returns for  regular  visit for followup of  Stage IIIB (T3, N2a, M0) adenocarcinoma of colon, S/P descending colon and rectal stump resection in September 2010 followed by chemotherapy consisting of FOLFOX. 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    Adenocarcinoma of colon   06/26/2009 Initial Diagnosis Invasive adenocarcinoma   06/28/2009 Surgery Invasive adenocarcinoma with two lesions, both extending inot the pericolonic adipose tissue, maximum tumor size was 6.0 cm with 4/32 lymph nodes positive for disease. pT3, pN2, pMx   10/16/2009 - 03/20/2010 Chemotherapy FOLFOX x 12 cycles   I personally reviewed and went over laboratory results with the patient.  The results are noted within this dictation.  I personally reviewed and went over laboratory results with the patient.  The results are noted within this dictation.  NCCN guidelines for surveillance for T3/T4, N0-2, M0 colon cancer are:   A. H+P every 3-6 months x 2 years and then every 6 months for a total of 5 years   B. CEA every 3 months x 2 years and then every 6 months for a total of 5 years   C. CT abd/pelvis annually for up to 5 years   D. Colonoscopy in 1 year except if no preoperative colonoscopy due to obstructing lesion, colonoscopy in 3-6 months.    1. If advanced adenoma, repeat in 1 year   2. If no advanced adenoma, repeat in 3 years, then every 5 years  E. PET scan not routinely recommended.  From an oncology standpoint, he is very stable without any  complaints and a negative ROS questioning.   Past Medical History  Diagnosis Date  . Cancer     colon ca surg/chemo  . Colon cancer   . Diabetes mellitus   . Atrial fibrillation   . Neuropathy   . Port catheter in place 11/16/2012  . Thoracic aortic aneurysm   . Adenocarcinoma of colon 05/31/2011    has Adenocarcinoma of colon; Port catheter in place; and History of kidney cancer excluding renal pelvis on his problem list.     has No Known Allergies.  [name] does not currently have medications on file.  Past Surgical History  Procedure Laterality Date  . Colon surgery    . Kidney surgery      Denies any headaches, dizziness, double vision, fevers, chills, night sweats, nausea, vomiting, diarrhea, constipation, chest pain, heart palpitations, shortness of breath, blood in stool, black tarry stool, urinary pain, urinary burning, urinary frequency, hematuria.   PHYSICAL EXAMINATION  ECOG PERFORMANCE STATUS: 0 - Asymptomatic  Filed Vitals:   12/22/13 1008  BP: 119/81  Pulse: 84  Temp: 98 F (36.7 C)  Resp: 22    GENERAL:alert, no distress, well nourished, well developed, comfortable, cooperative, obese and smiling SKIN: skin color, texture, turgor are normal, no rashes or significant lesions HEAD: Normocephalic, No masses, lesions, tenderness or abnormalities EYES: normal, PERRLA, EOMI, Conjunctiva are pink and non-injected EARS: External ears normal OROPHARYNX:mucous membranes are moist  NECK: supple, no adenopathy, trachea midline LYMPH:  no palpable lymphadenopathy, no hepatosplenomegaly  BREAST:not examined LUNGS: clear to auscultation  HEART: regular rate & rhythm, no murmurs and no gallops ABDOMEN:abdomen soft, non-tender, obese, normal bowel sounds and colostomy producing stool appropriately BACK: Back symmetric, no curvature. EXTREMITIES:less then 2 second capillary refill, no joint deformities, effusion, or inflammation, no skin discoloration, no cyanosis   NEURO: alert & oriented x 3 with fluent speech, no focal motor/sensory deficits, gait normal   LABORATORY DATA: CBC    Component Value Date/Time   WBC 5.8 11/26/2013 1510   RBC 4.40 11/26/2013 1510   HGB 13.0 11/26/2013 1510   HCT 39.6 11/26/2013 1510   PLT 215 11/26/2013 1510   MCV 90.0 11/26/2013 1510   MCH 29.5 11/26/2013 1510   MCHC 32.8 11/26/2013 1510   RDW 14.2 11/26/2013 1510   LYMPHSABS 0.9 11/26/2013 1510   MONOABS 0.6 11/26/2013 1510   EOSABS 0.1 11/26/2013 1510   BASOSABS 0.0 11/26/2013 1510      Chemistry      Component Value Date/Time   NA 140 11/26/2013 1510   K 4.6 11/26/2013 1510   CL 103 11/26/2013 1510   CO2 26 11/26/2013 1510   BUN 19 11/26/2013 1510   CREATININE 1.36* 11/26/2013 1510      Component Value Date/Time   CALCIUM 9.6 11/26/2013 1510   ALKPHOS 63 11/26/2013 1510   AST 11 11/26/2013 1510   ALT 15 11/26/2013 1510   BILITOT 0.4 11/26/2013 1510     Lab Results  Component Value Date   CEA 1.7 11/26/2013    Lab Results  Component Value Date   HGBA1C 6.0* 11/26/2013      RADIOGRAPHIC STUDIES:  08/03/2013  CLINICAL DATA: Abdominal pain. History of small bowel obstruction.  History of colonic malignancy with colostomy. The patient is status  post right nephrectomy for malignancy. Marland Kitchen  EXAM:  CT ABDOMEN AND PELVIS WITHOUT CONTRAST  TECHNIQUE:  Multidetector CT imaging of the abdomen and pelvis was performed  following the standard protocol without intravenous contrast.  COMPARISON: June 21, 2013.  FINDINGS:  The liver exhibits no focal mass nor ductal dilation. The  gallbladder is mildly distended and contains multiple calcified  stones similar to those previously demonstrated. There is no  pericholecystic fluid. There may be minimal gallbladder wall  thickening. The pancreas, spleen, nondistended stomach, adrenal  glands, and left kidney exhibit no acute abnormalities. There is a  stable exophytic lower pole soft tissue density structure  measuring  approximately 1.4 cm in diameter. It exhibits Hounsfield measurement  of +6 and is most compatible with a cyst. Stable increased density  in the perinephric fat is present. The caliber of the abdominal  aorta is normal.  There is an ostomy on the right given the patient's previous left  colectomy. There is herniation of mesenteric fat into the ostomy  site. There is no evidence of a small or large bowel obstruction.  There is no free fluid in the abdomen nor pelvis. The partially  distended urinary bladder is normal in appearance. The prostate  gland is mildly enlarged and produces an impression upon the urinary  bladder base. There is no inguinal or significant of umbilical  hernia.  The lumbar vertebral bodies are preserved in height. There is disk  space narrowing and vacuum phenomenon at L4-5 and L5-S1. The lung  bases are clear.  IMPRESSION:  1. There is no evidence of a small or large bowel obstruction. No  bowel masses demonstrated. The colostomy is normal in appearance  though there is  herniation of abdominal fat through the ostomy  defect which is not clearly resulting in obstruction currently.  2. There is a stable lower pole exophytic hypodensity involving the  left kidney.  3. There are stable gallstones present. The gallbladder appears  slightly more distended today and there is subjective minimal wall  thickening. Further evaluation of the gallbladder with ultrasound is  recommended.  Electronically Signed  By: David Martinique  On: 08/03/2013 17:07    ASSESSMENT:  1. Stage IIIB (T3, N2a, M0) adenocarcinoma of colon, S/P descending colon and rectal stump resection in September 2010 followed by chemotherapy consisting of FOLFOX. 2. 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 3. Chronic renal disease, G3a with GFR of 53 on 11/26/2013 4. Obesity 5. DM 6. COPD, S/P 2 ppd x 34 years 7. Neurodermatitis  8. Atrial fibrillation on Coumadin  managed by Dr. Karie Kirks 9. Cholelithiases on [place]  Patient Active Problem List   Diagnosis Date Noted  . History of kidney cancer excluding renal pelvis 06/19/2013  . Port catheter in place 11/16/2012  . Adenocarcinoma of colon 05/31/2011       PLAN:  1. I personally reviewed and went over laboratory results with the patient.  The results are noted within this dictation. 2. I personally reviewed and went over radiographic studies with the patient.  The results are noted within this dictation.   3. In 6 months, he will complete all surveillance per NCCN guidelines. 4. Labs in 6 months: CBC diff, CMET, CEA 5. CT abd/pelvis in 6 months 6. Review of NCCN guidelines 7. Continue follow-up with PCP.   THERAPY PLAN:  In 6 months, if all is well, 5 years worth of surveillance will be completed (following CEA and CT scan in the fall of 2015).  We will then see him annually.    NCCN guidelines for surveillance for T3/T4, N0-2, M0 colon cancer are:   A. H+P every 3-6 months x 2 years and then every 6 months for a total of 5 years   B. CEA every 3 months x 2 years and then every 6 months for a total of 5 years   C. CT abd/pelvis annually for up to 5 years   D. Colonoscopy in 1 year except if no preoperative colonoscopy due to obstructing lesion, colonoscopy in 3-6 months.    1. If advanced adenoma, repeat in 1 year   2. If no advanced adenoma, repeat in 3 years, then every 5 years  E. PET scan not routinely recommended.   All questions were answered. The patient knows to call the clinic with any problems, questions or concerns. We can certainly see the patient much sooner if necessary.  Patient and plan discussed with Dr. Farrel Gobble and he is in agreement with the aforementioned.   [name] 12/22/2013

## 2013-12-22 ENCOUNTER — Encounter (HOSPITAL_COMMUNITY): Payer: Medicare Other | Attending: Hematology and Oncology | Admitting: Oncology

## 2013-12-22 ENCOUNTER — Encounter (HOSPITAL_COMMUNITY): Payer: Self-pay | Admitting: Oncology

## 2013-12-22 VITALS — BP 119/81 | HR 84 | Temp 98.0°F | Resp 22 | Wt 305.0 lb

## 2013-12-22 DIAGNOSIS — E119 Type 2 diabetes mellitus without complications: Secondary | ICD-10-CM

## 2013-12-22 DIAGNOSIS — C189 Malignant neoplasm of colon, unspecified: Secondary | ICD-10-CM

## 2013-12-22 DIAGNOSIS — N189 Chronic kidney disease, unspecified: Secondary | ICD-10-CM

## 2013-12-22 DIAGNOSIS — E669 Obesity, unspecified: Secondary | ICD-10-CM

## 2013-12-22 DIAGNOSIS — Z85038 Personal history of other malignant neoplasm of large intestine: Secondary | ICD-10-CM

## 2013-12-22 DIAGNOSIS — Z85528 Personal history of other malignant neoplasm of kidney: Secondary | ICD-10-CM | POA: Insufficient documentation

## 2013-12-22 NOTE — Patient Instructions (Signed)
[facility] Discharge Instructions  RECOMMENDATIONS MADE BY THE CONSULTANT AND ANY TEST RESULTS WILL BE SENT TO YOUR REFERRING PHYSICIAN.  Return in 6 months for blood work, CT scan, and office visit.   Thank you for choosing [facility] to provide your oncology and hematology care.  To afford each patient quality time with our providers, please arrive at least 15 minutes before your scheduled appointment time.  With your help, our goal is to use those 15 minutes to complete the necessary work-up to ensure our physicians have the information they need to help with your evaluation and healthcare recommendations.    Effective January 1st, 2014, we ask that you re-schedule your appointment with our physicians should you arrive 10 or more minutes late for your appointment.  We strive to give you quality time with our providers, and arriving late affects you and other patients whose appointments are after yours.    Again, thank you for choosing [facility].  Our hope is that these requests will decrease the amount of time that you wait before being seen by our physicians.       _____________________________________________________________  Should you have questions after your visit to [facility], please contact our office at [phone] between the hours of 8:30 a.m. and 5:00 p.m.  Voicemails left after 4:30 p.m. will not be returned until the following business day.  For prescription refill requests, have your pharmacy contact our office with your prescription refill request.

## 2014-01-07 ENCOUNTER — Encounter (HOSPITAL_COMMUNITY): Payer: Medicare Other | Attending: Hematology and Oncology

## 2014-01-07 DIAGNOSIS — C186 Malignant neoplasm of descending colon: Secondary | ICD-10-CM

## 2014-01-07 DIAGNOSIS — Z95828 Presence of other vascular implants and grafts: Secondary | ICD-10-CM

## 2014-01-07 DIAGNOSIS — Z9889 Other specified postprocedural states: Secondary | ICD-10-CM | POA: Insufficient documentation

## 2014-01-07 DIAGNOSIS — Z452 Encounter for adjustment and management of vascular access device: Secondary | ICD-10-CM

## 2014-01-07 MED ORDER — SODIUM CHLORIDE 0.9 % IJ SOLN
10.0000 mL | INTRAMUSCULAR | Status: DC | PRN
  Administered 2014-01-07: 10 mL via INTRAVENOUS

## 2014-01-07 MED ORDER — HEPARIN SOD (PORK) LOCK FLUSH 100 UNIT/ML IV SOLN
INTRAVENOUS | Status: AC
  Filled 2014-01-07: qty 5

## 2014-01-07 MED ORDER — HEPARIN SOD (PORK) LOCK FLUSH 100 UNIT/ML IV SOLN
500.0000 [IU] | Freq: Once | INTRAVENOUS | Status: AC
  Administered 2014-01-07: 500 [IU] via INTRAVENOUS

## 2014-01-07 NOTE — Progress Notes (Signed)
[name] presented for Portacath access and flush. Portacath located left chest wall accessed with  H 20 needle. Good blood return present. Portacath flushed with 21ml NS and 500U/30ml Heparin and needle removed intact. Procedure without incident. Patient tolerated procedure well.

## 2014-02-18 ENCOUNTER — Encounter (HOSPITAL_COMMUNITY): Payer: Medicare Other

## 2014-02-24 ENCOUNTER — Encounter (HOSPITAL_COMMUNITY): Payer: Medicare Other | Attending: Hematology and Oncology

## 2014-02-24 DIAGNOSIS — C186 Malignant neoplasm of descending colon: Secondary | ICD-10-CM

## 2014-02-24 DIAGNOSIS — Z452 Encounter for adjustment and management of vascular access device: Secondary | ICD-10-CM

## 2014-02-24 DIAGNOSIS — Z9889 Other specified postprocedural states: Secondary | ICD-10-CM | POA: Insufficient documentation

## 2014-02-24 MED ORDER — HEPARIN SOD (PORK) LOCK FLUSH 100 UNIT/ML IV SOLN
INTRAVENOUS | Status: AC
  Filled 2014-02-24: qty 5

## 2014-02-24 MED ORDER — SODIUM CHLORIDE 0.9 % IJ SOLN
10.0000 mL | INTRAMUSCULAR | Status: DC | PRN
  Administered 2014-02-24: 10 mL via INTRAVENOUS

## 2014-02-24 MED ORDER — HEPARIN SOD (PORK) LOCK FLUSH 100 UNIT/ML IV SOLN
500.0000 [IU] | Freq: Once | INTRAVENOUS | Status: AC
  Administered 2014-02-24: 500 [IU] via INTRAVENOUS

## 2014-02-24 NOTE — Progress Notes (Signed)
[name] presented for Portacath access and flush. Portacath located lt chest wall accessed with  H 20 needle. Good blood return present. Portacath flushed with 27ml NS and 500U/81ml Heparin and needle removed intact. Procedure without incident. Patient tolerated procedure well.

## 2014-04-08 ENCOUNTER — Encounter (HOSPITAL_COMMUNITY): Payer: Medicare Other | Attending: Hematology and Oncology

## 2014-04-08 ENCOUNTER — Encounter (HOSPITAL_COMMUNITY): Payer: Medicare Other

## 2014-04-08 DIAGNOSIS — C186 Malignant neoplasm of descending colon: Secondary | ICD-10-CM

## 2014-04-08 DIAGNOSIS — Z452 Encounter for adjustment and management of vascular access device: Secondary | ICD-10-CM

## 2014-04-08 DIAGNOSIS — Z9889 Other specified postprocedural states: Secondary | ICD-10-CM | POA: Insufficient documentation

## 2014-04-08 MED ORDER — HEPARIN SOD (PORK) LOCK FLUSH 100 UNIT/ML IV SOLN
500.0000 [IU] | Freq: Once | INTRAVENOUS | Status: AC
  Administered 2014-04-08: 500 [IU] via INTRAVENOUS
  Filled 2014-04-08: qty 5

## 2014-04-08 MED ORDER — SODIUM CHLORIDE 0.9 % IJ SOLN
10.0000 mL | INTRAMUSCULAR | Status: DC | PRN
  Administered 2014-04-08: 10 mL via INTRAVENOUS

## 2014-04-08 NOTE — Progress Notes (Signed)
Marcellina Millin presented for Portacath access and flush.  Proper placement of portacath confirmed by CXR.  Portacath located left chest wall accessed with  H 20 needle.  Good blood return present. Portacath flushed with 31ml NS and 500U/39ml Heparin and needle removed intact.  Procedure tolerated well and without incident.

## 2014-05-20 ENCOUNTER — Encounter (HOSPITAL_COMMUNITY): Payer: Medicare Other | Attending: Hematology and Oncology

## 2014-05-20 DIAGNOSIS — Z9889 Other specified postprocedural states: Secondary | ICD-10-CM | POA: Insufficient documentation

## 2014-05-20 DIAGNOSIS — Z95828 Presence of other vascular implants and grafts: Secondary | ICD-10-CM

## 2014-05-20 MED ORDER — SODIUM CHLORIDE 0.9 % IJ SOLN
10.0000 mL | INTRAMUSCULAR | Status: DC | PRN
  Administered 2014-05-20: 10 mL via INTRAVENOUS

## 2014-05-20 MED ORDER — HEPARIN SOD (PORK) LOCK FLUSH 100 UNIT/ML IV SOLN
500.0000 [IU] | Freq: Once | INTRAVENOUS | Status: AC
  Administered 2014-05-20: 500 [IU] via INTRAVENOUS
  Filled 2014-05-20: qty 5

## 2014-05-20 NOTE — Progress Notes (Signed)
[name] presented for Portacath access and flush.  Proper placement of portacath confirmed by CXR.  Portacath located left  chest wall accessed with  H 20 needle.  Good blood return present. Portacath flushed with 47ml NS and 500U/52ml Heparin and needle removed intact.  Procedure tolerated well and without incident.

## 2014-06-24 ENCOUNTER — Encounter (HOSPITAL_BASED_OUTPATIENT_CLINIC_OR_DEPARTMENT_OTHER): Payer: Medicare Other

## 2014-06-24 ENCOUNTER — Ambulatory Visit (HOSPITAL_COMMUNITY)
Admission: RE | Admit: 2014-06-24 | Discharge: 2014-06-24 | Disposition: A | Discharge status: Home / Self Care | Payer: Medicare Other | Source: Ambulatory Visit | Attending: Oncology | Admitting: Oncology

## 2014-06-24 ENCOUNTER — Encounter (HOSPITAL_COMMUNITY): Payer: Self-pay

## 2014-06-24 ENCOUNTER — Encounter (HOSPITAL_COMMUNITY): Payer: Medicare Other | Attending: Hematology and Oncology

## 2014-06-24 ENCOUNTER — Other Ambulatory Visit (HOSPITAL_COMMUNITY): Payer: Self-pay | Admitting: Oncology

## 2014-06-24 VITALS — BP 122/81 | HR 68 | Temp 98.8°F | Resp 18

## 2014-06-24 DIAGNOSIS — K7689 Other specified diseases of liver: Secondary | ICD-10-CM | POA: Diagnosis not present

## 2014-06-24 DIAGNOSIS — Z9889 Other specified postprocedural states: Secondary | ICD-10-CM | POA: Insufficient documentation

## 2014-06-24 DIAGNOSIS — C189 Malignant neoplasm of colon, unspecified: Secondary | ICD-10-CM

## 2014-06-24 DIAGNOSIS — Z9049 Acquired absence of other specified parts of digestive tract: Secondary | ICD-10-CM | POA: Insufficient documentation

## 2014-06-24 DIAGNOSIS — Z933 Colostomy status: Secondary | ICD-10-CM | POA: Insufficient documentation

## 2014-06-24 DIAGNOSIS — K8020 Calculus of gallbladder without cholecystitis without obstruction: Secondary | ICD-10-CM | POA: Diagnosis not present

## 2014-06-24 DIAGNOSIS — Z85528 Personal history of other malignant neoplasm of kidney: Secondary | ICD-10-CM

## 2014-06-24 DIAGNOSIS — Z23 Encounter for immunization: Secondary | ICD-10-CM

## 2014-06-24 DIAGNOSIS — Z95828 Presence of other vascular implants and grafts: Secondary | ICD-10-CM

## 2014-06-24 DIAGNOSIS — C186 Malignant neoplasm of descending colon: Secondary | ICD-10-CM

## 2014-06-24 LAB — COMPREHENSIVE METABOLIC PANEL
ALBUMIN: 3.9 g/dL (ref 3.5–5.2)
ALK PHOS: 59 U/L (ref 39–117)
ALT: 21 U/L (ref 0–53)
AST: 15 U/L (ref 0–37)
Anion gap: 10 (ref 5–15)
BILIRUBIN TOTAL: 0.4 mg/dL (ref 0.3–1.2)
BUN: 15 mg/dL (ref 6–23)
CHLORIDE: 103 meq/L (ref 96–112)
CO2: 29 mEq/L (ref 19–32)
Calcium: 9.8 mg/dL (ref 8.4–10.5)
Creatinine, Ser: 1.42 mg/dL — ABNORMAL HIGH (ref 0.50–1.35)
GFR calc Af Amer: 58 mL/min — ABNORMAL LOW (ref >90)
GFR calc non Af Amer: 50 mL/min — ABNORMAL LOW (ref >90)
Glucose, Bld: 113 mg/dL — ABNORMAL HIGH (ref 70–99)
Potassium: 5.1 mEq/L (ref 3.7–5.3)
SODIUM: 142 meq/L (ref 137–147)
TOTAL PROTEIN: 7.7 g/dL (ref 6.0–8.3)

## 2014-06-24 LAB — CBC WITH DIFFERENTIAL/PLATELET
Basophils Absolute: 0.1 10*3/uL (ref 0.0–0.1)
Basophils Relative: 1 % (ref 0–1)
EOS ABS: 0.2 10*3/uL (ref 0.0–0.7)
Eosinophils Relative: 3 % (ref 0–5)
HCT: 40.1 % (ref 39.0–52.0)
HEMOGLOBIN: 13.4 g/dL (ref 13.0–17.0)
Lymphocytes Relative: 27 % (ref 12–46)
Lymphs Abs: 1.6 10*3/uL (ref 0.7–4.0)
MCH: 30.1 pg (ref 26.0–34.0)
MCHC: 33.4 g/dL (ref 30.0–36.0)
MCV: 90.1 fL (ref 78.0–100.0)
Monocytes Absolute: 0.8 10*3/uL (ref 0.1–1.0)
Monocytes Relative: 13 % — ABNORMAL HIGH (ref 3–12)
NEUTROS PCT: 56 % (ref 43–77)
Neutro Abs: 3.4 10*3/uL (ref 1.7–7.7)
PLATELETS: 214 10*3/uL (ref 150–400)
RBC: 4.45 MIL/uL (ref 4.22–5.81)
RDW: 14.4 % (ref 11.5–15.5)
WBC: 6 10*3/uL (ref 4.0–10.5)

## 2014-06-24 LAB — CEA: CEA: 2.5 ng/mL (ref 0.0–5.0)

## 2014-06-24 MED ORDER — SODIUM CHLORIDE 0.9 % IJ SOLN
10.0000 mL | INTRAMUSCULAR | Status: DC | PRN
  Administered 2014-06-24: 10 mL via INTRAVENOUS

## 2014-06-24 MED ORDER — HEPARIN SOD (PORK) LOCK FLUSH 100 UNIT/ML IV SOLN
500.0000 [IU] | Freq: Once | INTRAVENOUS | Status: AC
  Administered 2014-06-24: 500 [IU] via INTRAVENOUS

## 2014-06-24 MED ORDER — INFLUENZA VAC SPLIT QUAD 0.5 ML IM SUSY
0.5000 mL | PREFILLED_SYRINGE | Freq: Once | INTRAMUSCULAR | Status: AC
  Administered 2014-06-24: 0.5 mL via INTRAMUSCULAR

## 2014-06-24 MED ORDER — INFLUENZA VAC SPLIT QUAD 0.5 ML IM SUSY
PREFILLED_SYRINGE | INTRAMUSCULAR | Status: AC
  Filled 2014-06-24: qty 0.5

## 2014-06-24 MED ORDER — HEPARIN SOD (PORK) LOCK FLUSH 100 UNIT/ML IV SOLN
INTRAVENOUS | Status: AC
  Filled 2014-06-24: qty 5

## 2014-06-24 NOTE — Progress Notes (Signed)
[name] presented for Portacath access and flush.  Proper placement of portacath confirmed by CXR.  Portacath located LEFT  chest wall accessed with  H 20 needle.  Good blood return present. Portacath flushed with 52ml NS and 500U/84ml Heparin and needle removed intact.  Procedure tolerated well and without incident.

## 2014-06-24 NOTE — Progress Notes (Signed)
LABS FOR CBCD,CMP,CEA

## 2014-06-27 ENCOUNTER — Ambulatory Visit (HOSPITAL_COMMUNITY): Payer: Medicare Other | Admitting: Oncology

## 2014-06-27 ENCOUNTER — Encounter (HOSPITAL_BASED_OUTPATIENT_CLINIC_OR_DEPARTMENT_OTHER): Payer: Medicare Other

## 2014-06-27 ENCOUNTER — Encounter (HOSPITAL_COMMUNITY): Payer: Self-pay

## 2014-06-27 VITALS — BP 131/70 | HR 92 | Temp 98.9°F | Resp 18 | Wt 311.0 lb

## 2014-06-27 DIAGNOSIS — C189 Malignant neoplasm of colon, unspecified: Secondary | ICD-10-CM

## 2014-06-27 DIAGNOSIS — Z85528 Personal history of other malignant neoplasm of kidney: Secondary | ICD-10-CM

## 2014-06-27 DIAGNOSIS — J438 Other emphysema: Secondary | ICD-10-CM

## 2014-06-27 DIAGNOSIS — K8020 Calculus of gallbladder without cholecystitis without obstruction: Secondary | ICD-10-CM

## 2014-06-27 DIAGNOSIS — Z85038 Personal history of other malignant neoplasm of large intestine: Secondary | ICD-10-CM

## 2014-06-27 NOTE — Progress Notes (Signed)
[facility]  OFFICE PROGRESS NOTE  Dwayne Bellow, MD [address] [place] [address]  DIAGNOSIS: Adenocarcinoma of colon  History of kidney cancer excluding renal pelvis  Other emphysema  Chief Complaint  Patient presents with  . Colon Cancer  . Kidney cancer    CURRENT THERAPY: Surveillance per NCCN guidelines for colon cancer and kidney cancer.  INTERVAL HISTORY: [name] 67 y.o. male returns for followup of Stage IIIB (T3, N2a, M0) adenocarcinoma of colon, S/P descending colon and rectal stump resection in September 2010 followed by chemotherapy consisting of FOLFOX.  AND  Right clear cell renal cancer, S/P nephrectomy in Sept 2010 as well.  He continues to do well with normal bowel movements through his colostomy. Minimal prolapse is noted without bleeding or obstruction. He denies any cough, wheezing, PND, orthopnea, palpitations, lower extremity swelling or redness that is worsening, chest pain, PND, orthopnea, joint pain, headache, or seizures. Patient intends to purchase a motorcycle. Apparently he had risen a lot in the past.    MEDICAL HISTORY: Past Medical History  Diagnosis Date  . Cancer     colon ca surg/chemo  . Colon cancer   . Diabetes mellitus   . Atrial fibrillation   . Neuropathy   . Port catheter in place 11/16/2012  . Thoracic aortic aneurysm   . Adenocarcinoma of colon 05/31/2011    INTERIM HISTORY: has Adenocarcinoma of colon; Port catheter in place; and History of kidney cancer excluding renal pelvis on his problem list.   Adenocarcinoma of colon    06/26/2009  Initial Diagnosis  Invasive adenocarcinoma    06/28/2009  Surgery  Invasive adenocarcinoma with two lesions, both extending inot the pericolonic adipose tissue, maximum tumor size was 6.0 cm with 4/32 lymph nodes positive for disease. pT3, pN2, pMx    10/16/2009 - 03/20/2010  Chemotherapy  FOLFOX x 12 cycles      ALLERGIES:   has No Known Allergies.  MEDICATIONS: has a current medication list which includes the following prescription(s): diltiazem, hydrocodone-acetaminophen, metformin, multiple vitamins-minerals, pioglitazone, simvastatin, vitamins-lipotropics, and warfarin.  SURGICAL HISTORY:  Past Surgical History  Procedure Laterality Date  . Colon surgery    . Kidney surgery      FAMILY HISTORY: family history includes Cancer in his brother, maternal grandfather, and mother.  SOCIAL HISTORY:  reports that he has quit smoking. He has never used smokeless tobacco. He reports that he drinks alcohol. He reports that he does not use illicit drugs.  REVIEW OF SYSTEMS:  Other than that discussed above is noncontributory.  PHYSICAL EXAMINATION: ECOG PERFORMANCE STATUS: 1 - Symptomatic but completely ambulatory  Blood pressure 131/70, pulse 92, temperature 98.9 F (37.2 C), temperature source Oral, resp. rate 18, weight 311 lb (141.069 kg), SpO2 96.00%.  GENERAL:alert, no distress and comfortable. Morbidly obese. SKIN: skin color, texture, turgor are normal, no rashes or significant lesions EYES: PERLA; Conjunctiva are pink and non-injected, sclera clear SINUSES: No redness or tenderness over maxillary or ethmoid sinuses OROPHARYNX:no exudate, no erythema on lips, buccal mucosa, or tongue. NECK: supple, thyroid normal size, non-tender, without nodularity. No masses CHEST: Increased AP diameter with no breast masses. LifePort in place. LYMPH:  no palpable lymphadenopathy in the cervical, axillary or inguinal LUNGS: clear to auscultation and percussion with normal breathing effort HEART: Irregularly irregular with no S3.. ABDOMEN:abdomen soft, non-tender and normal bowel sounds. Colostomy in place with no evidence of purulence or hemorrhage. No  free fluid wave and shifting dullness. Liver and spleen not large. MUSCULOSKELETAL:no cyanosis of digits and no clubbing. Range of motion normal. +1 lower extremity  edema bilaterally. NEURO: alert & oriented x 3 with fluent speech, no focal motor/sensory deficits   LABORATORY DATA: Lab on 06/24/2014  Component Date Value Ref Range Status  . WBC 06/24/2014 6.0  4.0 - 10.5 K/uL Final  . RBC 06/24/2014 4.45  4.22 - 5.81 MIL/uL Final  . Hemoglobin 06/24/2014 13.4  13.0 - 17.0 g/dL Final  . HCT 06/24/2014 40.1  39.0 - 52.0 % Final  . MCV 06/24/2014 90.1  78.0 - 100.0 fL Final  . MCH 06/24/2014 30.1  26.0 - 34.0 pg Final  . MCHC 06/24/2014 33.4  30.0 - 36.0 g/dL Final  . RDW 06/24/2014 14.4  11.5 - 15.5 % Final  . Platelets 06/24/2014 214  150 - 400 K/uL Final  . Neutrophils Relative % 06/24/2014 56  43 - 77 % Final  . Neutro Abs 06/24/2014 3.4  1.7 - 7.7 K/uL Final  . Lymphocytes Relative 06/24/2014 27  12 - 46 % Final  . Lymphs Abs 06/24/2014 1.6  0.7 - 4.0 K/uL Final  . Monocytes Relative 06/24/2014 13* 3 - 12 % Final  . Monocytes Absolute 06/24/2014 0.8  0.1 - 1.0 K/uL Final  . Eosinophils Relative 06/24/2014 3  0 - 5 % Final  . Eosinophils Absolute 06/24/2014 0.2  0.0 - 0.7 K/uL Final  . Basophils Relative 06/24/2014 1  0 - 1 % Final  . Basophils Absolute 06/24/2014 0.1  0.0 - 0.1 K/uL Final  . Sodium 06/24/2014 142  137 - 147 mEq/L Final  . Potassium 06/24/2014 5.1  3.7 - 5.3 mEq/L Final  . Chloride 06/24/2014 103  96 - 112 mEq/L Final  . CO2 06/24/2014 29  19 - 32 mEq/L Final  . Glucose, Bld 06/24/2014 113* 70 - 99 mg/dL Final  . BUN 06/24/2014 15  6 - 23 mg/dL Final  . Creatinine, Ser 06/24/2014 1.42* 0.50 - 1.35 mg/dL Final  . Calcium 06/24/2014 9.8  8.4 - 10.5 mg/dL Final  . Total Protein 06/24/2014 7.7  6.0 - 8.3 g/dL Final  . Albumin 06/24/2014 3.9  3.5 - 5.2 g/dL Final  . AST 06/24/2014 15  0 - 37 U/L Final  . ALT 06/24/2014 21  0 - 53 U/L Final  . Alkaline Phosphatase 06/24/2014 59  39 - 117 U/L Final  . Total Bilirubin 06/24/2014 0.4  0.3 - 1.2 mg/dL Final  . GFR calc non Af Amer 06/24/2014 50* >90 mL/min Final  . GFR calc Af  Amer 06/24/2014 58* >90 mL/min Final   Comment: (NOTE)                          The eGFR has been calculated using the CKD EPI equation.                          This calculation has not been validated in all clinical situations.                          eGFR's persistently <90 mL/min signify possible Chronic Kidney                          Disease.  . Anion gap 06/24/2014 10  5 - 15 Final  .  CEA 06/24/2014 2.5  0.0 - 5.0 ng/mL Final   Performed at [facility]: No new pathology.  Urinalysis    Component Value Date/Time   COLORURINE YELLOW 08/03/2013 1630   APPEARANCEUR CLEAR 08/03/2013 1630   LABSPEC 1.015 08/03/2013 1630   PHURINE 8.0 08/03/2013 1630   GLUCOSEU NEGATIVE 08/03/2013 1630   HGBUR NEGATIVE 08/03/2013 1630   BILIRUBINUR NEGATIVE 08/03/2013 1630   KETONESUR TRACE* 08/03/2013 1630   PROTEINUR NEGATIVE 08/03/2013 1630   UROBILINOGEN 0.2 08/03/2013 1630   NITRITE NEGATIVE 08/03/2013 1630   LEUKOCYTESUR NEGATIVE 08/03/2013 1630    RADIOGRAPHIC STUDIES: Ct Abdomen Pelvis Wo Contrast  06/24/2014   CLINICAL DATA:  Adenocarcinoma of the colon. Surveillance for colon cancer.  EXAM: CT ABDOMEN AND PELVIS WITHOUT CONTRAST  TECHNIQUE: Multidetector CT imaging of the abdomen and pelvis was performed following the standard protocol without IV contrast.  COMPARISON:  08/03/2013.  FINDINGS: Lower chest:  Unremarkable.  Hepatobiliary: Mild diffuse decreased attenuation throughout the hepatic parenchyma, compatible with mild hepatic steatosis. No focal hepatic lesions are identified on today's non contrast CT examination. No definite intra or extrahepatic biliary ductal dilatation. Small calcified gallstones are noted in the neck of the gallbladder. No signs of acute cholecystitis at this time.  Spleen: Unremarkable.  Pancreas: No definite pancreatic ductal dilatation or focal pancreatic lesion noted on today's non contrast CT examination.  Stomach/Bowel: The unenhanced appearance  of the stomach is normal. Status post left hemicolectomy with Hartmann's pouch in the pelvis, and right lower quadrant colostomy. No pathologic dilatation of the small bowel or remaining colon. No gross inflammatory changes are seen associated with the walls of the small bowel or colon.  Adrenals/urinary tract: Status post right nephrectomy. Bilateral adrenal glands are normal in appearance. There is a 14 mm exophytic low-attenuation lesion in the anterior aspect of the lower pole of the left kidney, which is incompletely characterized on today's non contrast CT examination, but similar to numerous remote prior studies dating back to at least 06/17/2011, strongly favored to represent a small simple cyst. Urinary bladder is unremarkable in appearance. No left hydroureteronephrosis.  Vascular/Lymphatic: Extensive atherosclerotic calcifications are noted throughout the abdominal and pelvic vasculature, without evidence of aneurysm. No pathologically enlarged lymph nodes are identified in the abdomen or pelvis on today's non contrast CT examination.  Reproductive: Prostate gland is unremarkable in appearance.  Musculoskeletal: There are no aggressive appearing lytic or blastic lesions noted in the visualized portions of the skeleton.  Other: No significant volume of ascites.  No pneumoperitoneum.  IMPRESSION: 1. No definite evidence of metastatic disease in the abdomen or pelvis on today's noncontrast CT examination. 2. Status post left hemicolectomy with right lower quadrant colostomy. 3. Status post right nephrectomy. 4. Cholelithiasis without evidence to suggest acute cholecystitis at this time. 5. Mild hepatic steatosis. 6. Additional incidental findings, as above.   Electronically Signed   By: Vinnie Langton M.D.   On: 06/24/2014 09:57    ASSESSMENT:  1. Stage IIIB (T3, N2a, M0) adenocarcinoma of colon, S/P descending colon and rectal stump resection in September 2010 followed by chemotherapy consisting of  FOLFOX, no evidence of disease. 2. Right clear cell renal cancer, S/P nephrectomy in Sept 2010 as well, no evidence of disease. 3. Chronic renal disease, G3a with GFR of 53 on 11/26/2013  4. Obesity  5. DM  6. COPD, S/P 2 ppd x 34 years  7. Neurodermatitis  8. Atrial fibrillation on Coumadin managed by Dr. Karie Kirks  9. Cholelithiases on [place]    PLAN:  #1. Continue watchful expectation. Patient was told to call should L. bleeding, increasing fatigue, fever, night sweats, cough or wheezing, or abdominal pain. He was instructed to try to maintain a low-fat diet due to the presence of gallstones. #2. Followup in 6 months with CBC, chem profile, CEA. Neck CTs in September 2016.   All questions were answered. The patient knows to call the clinic with any problems, questions or concerns. We can certainly see the patient much sooner if necessary.   I spent 25 minutes counseling the patient face to face. The total time spent in the appointment was 30 minutes.    Doroteo Bradford, MD 06/27/2014 10:21 AM  DISCLAIMER:  This note was dictated with voice recognition software.  Similar sounding words can inadvertently be transcribed inaccurately and may not be corrected upon review.

## 2014-06-27 NOTE — Patient Instructions (Signed)
[facility] Discharge Instructions  RECOMMENDATIONS MADE BY THE CONSULTANT AND ANY TEST RESULTS WILL BE SENT TO YOUR REFERRING PHYSICIAN. We will see you in 6 months for repeat lab work and a [place] appointment.  Please call for any questions or concerns.   Thank you for choosing [facility] to provide your oncology and hematology care.  To afford each patient quality time with our providers, please arrive at least 15 minutes before your scheduled appointment time.  With your help, our goal is to use those 15 minutes to complete the necessary work-up to ensure our physicians have the information they need to help with your evaluation and healthcare recommendations.    Effective January 1st, 2014, we ask that you re-schedule your appointment with our physicians should you arrive 10 or more minutes late for your appointment.  We strive to give you quality time with our providers, and arriving late affects you and other patients whose appointments are after yours.    Again, thank you for choosing [facility].  Our hope is that these requests will decrease the amount of time that you wait before being seen by our physicians.       _____________________________________________________________  Should you have questions after your visit to [facility], please contact our office at [phone] between the hours of 8:30 a.m. and 4:30 p.m.  Voicemails left after 4:30 p.m. will not be returned until the following business day.  For prescription refill requests, have your pharmacy contact our office with your prescription refill request.    _______________________________________________________________  We hope that we have given you very good care.  You may receive a patient satisfaction survey in the mail, please complete it and return it as soon as possible.  We value your  feedback!  _______________________________________________________________  Have you asked about our STAR program?  STAR stands for Survivorship Training and Rehabilitation, and this is a nationally recognized cancer care program that focuses on survivorship and rehabilitation.  Cancer and cancer treatments may cause problems, such as, pain, making you feel tired and keeping you from doing the things that you need or want to do. Cancer rehabilitation can help. Our goal is to reduce these troubling effects and help you have the best quality of life possible.  You may receive a survey from a nurse that asks questions about your current state of health.  Based on the survey results, all eligible patients will be referred to the [facility] program for an evaluation so we can better serve you!  A frequently asked questions sheet is available upon request.

## 2014-08-05 ENCOUNTER — Encounter (HOSPITAL_COMMUNITY): Payer: Medicare Other | Attending: Hematology and Oncology

## 2014-08-05 DIAGNOSIS — Z9889 Other specified postprocedural states: Secondary | ICD-10-CM | POA: Insufficient documentation

## 2014-08-05 DIAGNOSIS — Z95828 Presence of other vascular implants and grafts: Secondary | ICD-10-CM

## 2014-08-05 DIAGNOSIS — C186 Malignant neoplasm of descending colon: Secondary | ICD-10-CM

## 2014-08-05 DIAGNOSIS — Z452 Encounter for adjustment and management of vascular access device: Secondary | ICD-10-CM

## 2014-08-05 MED ORDER — HEPARIN SOD (PORK) LOCK FLUSH 100 UNIT/ML IV SOLN
500.0000 [IU] | Freq: Once | INTRAVENOUS | Status: AC
  Administered 2014-08-05: 500 [IU] via INTRAVENOUS
  Filled 2014-08-05: qty 5

## 2014-08-05 MED ORDER — SODIUM CHLORIDE 0.9 % IJ SOLN
10.0000 mL | Freq: Once | INTRAMUSCULAR | Status: AC
  Administered 2014-08-05: 10 mL via INTRAVENOUS

## 2014-08-05 NOTE — Patient Instructions (Signed)
[facility] Discharge Instructions  RECOMMENDATIONS MADE BY THE CONSULTANT AND ANY TEST RESULTS WILL BE SENT TO YOUR REFERRING PHYSICIAN.  INSTRUCTIONS/FOLLOW-UP: Continue port flush every 6 weeks. Return as scheduled.  Thank you for choosing [facility] to provide your oncology and hematology care.  To afford each patient quality time with our providers, please arrive at least 15 minutes before your scheduled appointment time.  With your help, our goal is to use those 15 minutes to complete the necessary work-up to ensure our physicians have the information they need to help with your evaluation and healthcare recommendations.    Effective January 1st, 2014, we ask that you re-schedule your appointment with our physicians should you arrive 10 or more minutes late for your appointment.  We strive to give you quality time with our providers, and arriving late affects you and other patients whose appointments are after yours.    Again, thank you for choosing [facility].  Our hope is that these requests will decrease the amount of time that you wait before being seen by our physicians.       _____________________________________________________________  Should you have questions after your visit to [facility], please contact our office at [phone] between the hours of 8:30 a.m. and 4:30 p.m.  Voicemails left after 4:30 p.m. will not be returned until the following business day.  For prescription refill requests, have your pharmacy contact our office with your prescription refill request.    _______________________________________________________________  We hope that we have given you very good care.  You may receive a patient satisfaction survey in the mail, please complete it and return it as soon as possible.  We value your feedback!  _______________________________________________________________  Have you asked about our  STAR program?  STAR stands for Survivorship Training and Rehabilitation, and this is a nationally recognized cancer care program that focuses on survivorship and rehabilitation.  Cancer and cancer treatments may cause problems, such as, pain, making you feel tired and keeping you from doing the things that you need or want to do. Cancer rehabilitation can help. Our goal is to reduce these troubling effects and help you have the best quality of life possible.  You may receive a survey from a nurse that asks questions about your current state of health.  Based on the survey results, all eligible patients will be referred to the [facility] program for an evaluation so we can better serve you!  A frequently asked questions sheet is available upon request.

## 2014-08-05 NOTE — Progress Notes (Signed)
Tavone H Fill presented for Portacath access and flush. Proper placement of portacath confirmed by CXR. Portacath located left chest wall accessed with  H 20 needle. Good blood return present. Portacath flushed with 20ml NS and 500U/5ml Heparin and needle removed intact. Procedure without incident. Patient tolerated procedure well.   

## 2014-08-08 ENCOUNTER — Encounter (HOSPITAL_COMMUNITY): Payer: Medicare Other

## 2014-09-19 ENCOUNTER — Encounter (HOSPITAL_COMMUNITY): Payer: Medicare Other | Attending: Hematology and Oncology

## 2014-09-19 DIAGNOSIS — Z9889 Other specified postprocedural states: Secondary | ICD-10-CM | POA: Insufficient documentation

## 2014-10-31 ENCOUNTER — Encounter (HOSPITAL_COMMUNITY): Payer: Medicare Other | Attending: Hematology and Oncology

## 2014-10-31 DIAGNOSIS — Z9889 Other specified postprocedural states: Secondary | ICD-10-CM | POA: Insufficient documentation

## 2014-10-31 DIAGNOSIS — Z95828 Presence of other vascular implants and grafts: Secondary | ICD-10-CM

## 2014-10-31 DIAGNOSIS — C186 Malignant neoplasm of descending colon: Secondary | ICD-10-CM

## 2014-10-31 DIAGNOSIS — Z452 Encounter for adjustment and management of vascular access device: Secondary | ICD-10-CM

## 2014-10-31 MED ORDER — HEPARIN SOD (PORK) LOCK FLUSH 100 UNIT/ML IV SOLN
500.0000 [IU] | Freq: Once | INTRAVENOUS | Status: AC
  Administered 2014-10-31: 500 [IU] via INTRAVENOUS

## 2014-10-31 MED ORDER — SODIUM CHLORIDE 0.9 % IJ SOLN
10.0000 mL | Freq: Once | INTRAMUSCULAR | Status: AC
  Administered 2014-10-31: 10 mL via INTRAVENOUS

## 2014-10-31 MED ORDER — HEPARIN SOD (PORK) LOCK FLUSH 100 UNIT/ML IV SOLN
INTRAVENOUS | Status: AC
  Filled 2014-10-31: qty 5

## 2014-10-31 NOTE — Progress Notes (Signed)
[name] presented for Portacath access and flush. Proper placement of portacath confirmed by CXR. Portacath located left chest wall accessed with  H 20 needle. Good blood return present. Portacath flushed with 52ml NS and 500U/1ml Heparin and needle removed intact. Procedure without incident. Patient tolerated procedure well.

## 2014-10-31 NOTE — Patient Instructions (Signed)
[facility] at [facility] Discharge Instructions  RECOMMENDATIONS MADE BY THE CONSULTANT AND ANY TEST RESULTS WILL BE SENT TO YOUR REFERRING PHYSICIAN.  Port flush done today. Labs due with next port flush. MD appointment in March 2016. Return as scheduled.  Thank you for choosing [facility] at [facility] to provide your oncology and hematology care.  To afford each patient quality time with our provider, please arrive at least 15 minutes before your scheduled appointment time.    You need to re-schedule your appointment should you arrive 10 or more minutes late.  We strive to give you quality time with our providers, and arriving late affects you and other patients whose appointments are after yours.  Also, if you no show three or more times for appointments you may be dismissed from the clinic at the providers discretion.     Again, thank you for choosing [facility].  Our hope is that these requests will decrease the amount of time that you wait before being seen by our physicians.       _____________________________________________________________  Should you have questions after your visit to [facility], please contact our office at [phone] between the hours of 8:30 a.m. and 4:30 p.m.  Voicemails left after 4:30 p.m. will not be returned until the following business day.  For prescription refill requests, have your pharmacy contact our office.

## 2014-12-06 ENCOUNTER — Ambulatory Visit: Payer: Medicare Other | Admitting: Cardiology

## 2014-12-12 ENCOUNTER — Encounter (HOSPITAL_COMMUNITY): Payer: Self-pay

## 2014-12-12 ENCOUNTER — Encounter (HOSPITAL_COMMUNITY): Payer: Medicare Other | Attending: Hematology & Oncology

## 2014-12-12 VITALS — BP 132/82 | HR 82 | Temp 99.4°F | Resp 16 | Wt 310.0 lb

## 2014-12-12 DIAGNOSIS — Z452 Encounter for adjustment and management of vascular access device: Secondary | ICD-10-CM | POA: Diagnosis not present

## 2014-12-12 DIAGNOSIS — C189 Malignant neoplasm of colon, unspecified: Secondary | ICD-10-CM | POA: Insufficient documentation

## 2014-12-12 DIAGNOSIS — Z85038 Personal history of other malignant neoplasm of large intestine: Secondary | ICD-10-CM | POA: Diagnosis not present

## 2014-12-12 LAB — CBC WITH DIFFERENTIAL/PLATELET
Basophils Absolute: 0 10*3/uL (ref 0.0–0.1)
Basophils Relative: 0 % (ref 0–1)
EOS PCT: 2 % (ref 0–5)
Eosinophils Absolute: 0.2 10*3/uL (ref 0.0–0.7)
HCT: 38.6 % — ABNORMAL LOW (ref 39.0–52.0)
HEMOGLOBIN: 12.4 g/dL — AB (ref 13.0–17.0)
LYMPHS PCT: 12 % (ref 12–46)
Lymphs Abs: 1 10*3/uL (ref 0.7–4.0)
MCH: 29.7 pg (ref 26.0–34.0)
MCHC: 32.1 g/dL (ref 30.0–36.0)
MCV: 92.3 fL (ref 78.0–100.0)
MONO ABS: 1 10*3/uL (ref 0.1–1.0)
MONOS PCT: 12 % (ref 3–12)
Neutro Abs: 6.4 10*3/uL (ref 1.7–7.7)
Neutrophils Relative %: 74 % (ref 43–77)
Platelets: 209 10*3/uL (ref 150–400)
RBC: 4.18 MIL/uL — ABNORMAL LOW (ref 4.22–5.81)
RDW: 14.1 % (ref 11.5–15.5)
WBC: 8.7 10*3/uL (ref 4.0–10.5)

## 2014-12-12 LAB — COMPREHENSIVE METABOLIC PANEL
ALT: 23 U/L (ref 0–53)
ANION GAP: 9 (ref 5–15)
AST: 14 U/L (ref 0–37)
Albumin: 4 g/dL (ref 3.5–5.2)
Alkaline Phosphatase: 51 U/L (ref 39–117)
BILIRUBIN TOTAL: 0.5 mg/dL (ref 0.3–1.2)
BUN: 15 mg/dL (ref 6–23)
CO2: 25 mmol/L (ref 19–32)
CREATININE: 1.41 mg/dL — AB (ref 0.50–1.35)
Calcium: 9.1 mg/dL (ref 8.4–10.5)
Chloride: 103 mmol/L (ref 96–112)
GFR, EST AFRICAN AMERICAN: 58 mL/min — AB (ref >90)
GFR, EST NON AFRICAN AMERICAN: 50 mL/min — AB (ref >90)
GLUCOSE: 130 mg/dL — AB (ref 70–99)
Potassium: 4.5 mmol/L (ref 3.5–5.1)
Sodium: 137 mmol/L (ref 135–145)
Total Protein: 7.4 g/dL (ref 6.0–8.3)

## 2014-12-12 LAB — LACTATE DEHYDROGENASE: LDH: 208 U/L (ref 94–250)

## 2014-12-12 MED ORDER — SODIUM CHLORIDE 0.9 % IJ SOLN
10.0000 mL | INTRAMUSCULAR | Status: DC | PRN
  Administered 2014-12-12: 10 mL via INTRAVENOUS
  Filled 2014-12-12: qty 10

## 2014-12-12 MED ORDER — HEPARIN SOD (PORK) LOCK FLUSH 100 UNIT/ML IV SOLN
500.0000 [IU] | Freq: Once | INTRAVENOUS | Status: AC
  Administered 2014-12-12: 500 [IU] via INTRAVENOUS
  Filled 2014-12-12: qty 5

## 2014-12-12 NOTE — Progress Notes (Signed)
..  [name] presented for Portacath access and flush.    Portacath located lt chest wall accessed with  H 20 needle.  Good blood return present. Portacath flushed with 65ml NS and 500U/36ml Heparin and needle removed intact.  Procedure tolerated well and without incident.

## 2014-12-13 LAB — CEA: CEA: 3 ng/mL (ref 0.0–4.7)

## 2014-12-19 ENCOUNTER — Ambulatory Visit (INDEPENDENT_AMBULATORY_CARE_PROVIDER_SITE_OTHER): Payer: Medicare Other | Admitting: Cardiology

## 2014-12-19 ENCOUNTER — Encounter: Payer: Self-pay | Admitting: Cardiology

## 2014-12-19 VITALS — BP 118/78 | HR 90 | Ht 74.0 in | Wt 309.0 lb

## 2014-12-19 DIAGNOSIS — I712 Thoracic aortic aneurysm, without rupture, unspecified: Secondary | ICD-10-CM

## 2014-12-19 DIAGNOSIS — N183 Chronic kidney disease, stage 3 unspecified: Secondary | ICD-10-CM

## 2014-12-19 DIAGNOSIS — I482 Chronic atrial fibrillation, unspecified: Secondary | ICD-10-CM

## 2014-12-19 DIAGNOSIS — R0602 Shortness of breath: Secondary | ICD-10-CM | POA: Diagnosis not present

## 2014-12-19 DIAGNOSIS — I10 Essential (primary) hypertension: Secondary | ICD-10-CM

## 2014-12-19 NOTE — Progress Notes (Signed)
Cardiology Office Note  Date: 12/19/2014   ID: [name], DOB 02/05/1946, MRN [id number]  PCP: Audi Bellow, MD  Primary Cardiologist: Rozann Lesches, MD   Chief Complaint  Patient presents with  . Atrial Fibrillation    History of Present Illness: [name] is a 69 y.o. male referred for cardiology consultation by Dr. Karie Shea. Records were reviewed. Patient was previously evaluated by Dr. Mathis Shea with [facility] several years ago for atrial fibrillation. I do not have these notes at hand. He states that he has been in atrial fibrillation for approximately 10 years, on diltiazem and Coumadin long-term. He reports less of a sense of palpitations over time, in fact has had to have his diltiazem cut back over the years, apparently related to symptomatic bradycardia. Last echocardiogram from 2010 as noted below. Chronically short of breath, NYHA class 2-3, no chest pain.  Cancer history is noted below, he continues with close follow-up in the oncology clinic. He has a noncontrasted CT scan of the abdomen and pelvis each year. Incidentally, he had a contrasted chest CT back in 2012 that showed aneurysmal dilatation of the thoracic aorta up to 5.2 cm. He has not had any contrasted studies since then due to renal insufficiency.   Past Medical History  Diagnosis Date  . Adenocarcinoma of colon 2010    Stage IIIB (T3, N2a, M0), colectomy and chemotherapy with FOLFOX  . Type 2 diabetes mellitus   . Chronic atrial fibrillation   . Neuropathy   . Port catheter in place   . Thoracic aortic aneurysm   . CKD (chronic kidney disease) stage 2, GFR 60-89 ml/min   . Essential hypertension   . Mixed hyperlipidemia   . 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 Colectomy    . Nephrectomy      Current Outpatient Prescriptions  Medication Sig Dispense Refill  . diltiazem (CARDIZEM) 60 MG tablet  Take 60 mg by mouth 2 (two) times daily.     Marland Kitchen HYDROcodone-acetaminophen (VICODIN) 5-500 MG per tablet Take 2 tablets by mouth every 4 (four) hours as needed for pain.     . metFORMIN (GLUCOPHAGE) 1000 MG tablet Take 1,000 mg by mouth 2 (two) times daily with a meal.      . Multiple Vitamins-Minerals (COMPLETE MULTIVITAMIN/MINERAL PO) Take 1 tablet by mouth daily.    . pioglitazone (ACTOS) 45 MG tablet Take 45 mg by mouth daily.      . simvastatin (ZOCOR) 40 MG tablet Take 40 mg by mouth every other day.     . Vitamins-Lipotropics (EAR HEALTH PLUS PO) Take 1 tablet by mouth daily.    Marland Kitchen warfarin (COUMADIN) 5 MG tablet Take 5-10 mg by mouth See admin instructions. Take 5 mg for 6 days then on day 7 take 10 mg     No current facility-administered medications for this visit.    Allergies:  Review of patient's allergies indicates no known allergies.   Social History: The patient  reports that he has quit smoking. His smoking use included Cigarettes. He has never used smokeless tobacco. He reports that he drinks alcohol. He reports that he does not use illicit drugs.   Family History: The patient's family history includes Breast cancer in his mother; Colon cancer in his brother and maternal grandfather.   ROS:  Please see the history of present illness. Otherwise, complete review  of systems is positive for arthritic pains.  All other systems are reviewed and negative.    Physical Exam: VS:  BP 118/78 mmHg  Pulse 90  Ht 6\' 2"  (1.88 m)  Wt 309 lb (140.161 kg)  BMI 39.66 kg/m2, BMI Body mass index is 39.66 kg/(m^2).  Wt Readings from Last 3 Encounters:  12/19/14 309 lb (140.161 kg)  12/12/14 310 lb (140.615 kg)  06/27/14 311 lb (141.069 kg)     General: Morbidly obese male, appears comfortable at rest. HEENT: Conjunctiva and lids normal, oropharynx clear, bearded. Neck: Supple, no elevated JVP or carotid bruits, no thyromegaly. Lungs: Clear to auscultation, nonlabored breathing at  rest. Cardiac: Irregularly irregular, no S3, soft systolic murmur, no pericardial rub. Abdomen: Soft, nontender, obese, bowel sounds present, no guarding or rebound. Extremities: Mild ankle edema, distal pulses 2+. Skin: Warm and dry. Musculoskeletal: No kyphosis. Neuropsychiatric: Alert and oriented x3, affect grossly appropriate.   ECG: Tracing from November 2014 showed rate-controlled atrial fibrillation. Repeat tracing today shows rate-controlled atrial fibrillation.  Recent Labwork: 12/12/2014: ALT 23; AST 14; BUN 15; Creatinine 1.41*; Hemoglobin 12.4*; Platelets 209; Potassium 4.5; Sodium 137   Other Studies Reviewed Today:  Echocardiogram 06/26/2009: Study Conclusions  1. Left ventricle: The cavity size was normal. There was severe  concentric hypertrophy. Systolic function was mildly reduced. The  estimated ejection fraction was in the range of 45% to 50%.  Regional wall motion abnormalities cannot be excluded. Doppler  parameters are consistent with abnormal left ventricular  relaxation (grade 1 diastolic dysfunction). 2. Mitral valve: Calcified annulus. 3. Left atrium: The atrium was moderately dilated. 4. Right atrium: The atrium was mildly dilated.   ASSESSMENT AND PLAN:  1. Chronic atrial fibrillation, managed with strategy of heart rate control and anticoagulation. Coumadin is followed by Dr. Karie Shea. Heart rate is in the 90s at rest today. My understanding is that Cardizem dose has been reduced over the years presumably due to symptomatic bradycardia/fatigue. He has chronic shortness of breath, we will obtain a follow-up echocardiogram to ensure stability and LVEF.  2. Thoracic aortic aneurysm documented incidentally by contrasted chest CT in 2012. He has not had any contrasted studies since that time due to renal insufficiency. He is status post nephrectomy and prefers not to have any contrasted studies. Hopefully we will be able to visualize some  of his thoracic aorta with the echocardiogram.  3. Essential hypertension, blood pressure is normal today.  4. CKD stage 2-3, recent creatinine 1.4.   Current medicines are reviewed with the patient today.  No changes remain current regimen.   Orders Placed This Encounter  Procedures  . 2D Echocardiogram with contrast    Disposition: FU with me in 1 year.   Signed, Satira Sark, MD, [facility] 12/19/2014 3:00 PM    [facility] at [facility]. [address] [place], [place] [address] Phone: [phone]; Fax: [phone]

## 2014-12-19 NOTE — Patient Instructions (Signed)
Your physician wants you to follow-up in: 1 year with Dr McDowell You will receive a reminder letter in the mail two months in advance. If you don't receive a letter, please call our office to schedule the follow-up appointment.     Your physician recommends that you continue on your current medications as directed. Please refer to the Current Medication list given to you today.     Your physician has requested that you have an echocardiogram. Echocardiography is a painless test that uses sound waves to create images of your heart. It provides your doctor with information about the size and shape of your heart and how well your heart's chambers and valves are working. This procedure takes approximately one hour. There are no restrictions for this procedure.      Thank you for choosing [facility] !        

## 2014-12-20 NOTE — Addendum Note (Signed)
Addended by: Levonne Hubert on: 12/20/2014 10:50 AM   Modules accepted: Orders

## 2014-12-21 ENCOUNTER — Other Ambulatory Visit (HOSPITAL_COMMUNITY): Payer: Medicare Other

## 2014-12-26 ENCOUNTER — Ambulatory Visit (HOSPITAL_COMMUNITY): Payer: Medicare Other | Admitting: Hematology & Oncology

## 2015-01-10 ENCOUNTER — Encounter (HOSPITAL_COMMUNITY): Payer: Medicare Other | Attending: Hematology and Oncology | Admitting: Hematology & Oncology

## 2015-01-10 ENCOUNTER — Encounter (HOSPITAL_COMMUNITY): Payer: Self-pay | Admitting: Hematology & Oncology

## 2015-01-10 VITALS — BP 109/95 | HR 75 | Temp 99.5°F | Resp 18 | Wt 305.6 lb

## 2015-01-10 DIAGNOSIS — D6489 Other specified anemias: Secondary | ICD-10-CM

## 2015-01-10 DIAGNOSIS — Z452 Encounter for adjustment and management of vascular access device: Secondary | ICD-10-CM

## 2015-01-10 DIAGNOSIS — Z85038 Personal history of other malignant neoplasm of large intestine: Secondary | ICD-10-CM | POA: Diagnosis not present

## 2015-01-10 DIAGNOSIS — Z9889 Other specified postprocedural states: Secondary | ICD-10-CM | POA: Diagnosis not present

## 2015-01-10 DIAGNOSIS — Z85528 Personal history of other malignant neoplasm of kidney: Secondary | ICD-10-CM

## 2015-01-10 DIAGNOSIS — C189 Malignant neoplasm of colon, unspecified: Secondary | ICD-10-CM

## 2015-01-10 LAB — CBC WITH DIFFERENTIAL/PLATELET
Basophils Absolute: 0 10*3/uL (ref 0.0–0.1)
Basophils Relative: 1 % (ref 0–1)
Eosinophils Absolute: 0.2 10*3/uL (ref 0.0–0.7)
Eosinophils Relative: 2 % (ref 0–5)
HCT: 40 % (ref 39.0–52.0)
HEMOGLOBIN: 13.1 g/dL (ref 13.0–17.0)
LYMPHS PCT: 17 % (ref 12–46)
Lymphs Abs: 1.1 10*3/uL (ref 0.7–4.0)
MCH: 29.8 pg (ref 26.0–34.0)
MCHC: 32.8 g/dL (ref 30.0–36.0)
MCV: 91.1 fL (ref 78.0–100.0)
MONO ABS: 0.8 10*3/uL (ref 0.1–1.0)
MONOS PCT: 12 % (ref 3–12)
NEUTROS ABS: 4.4 10*3/uL (ref 1.7–7.7)
Neutrophils Relative %: 68 % (ref 43–77)
Platelets: 205 10*3/uL (ref 150–400)
RBC: 4.39 MIL/uL (ref 4.22–5.81)
RDW: 13.5 % (ref 11.5–15.5)
WBC: 6.5 10*3/uL (ref 4.0–10.5)

## 2015-01-10 MED ORDER — SODIUM CHLORIDE 0.9 % IJ SOLN
10.0000 mL | INTRAMUSCULAR | Status: DC | PRN
  Administered 2015-01-10: 10 mL via INTRAVENOUS
  Filled 2015-01-10: qty 10

## 2015-01-10 MED ORDER — HEPARIN SOD (PORK) LOCK FLUSH 100 UNIT/ML IV SOLN
500.0000 [IU] | Freq: Once | INTRAVENOUS | Status: AC
Start: 2015-01-10 — End: 2015-01-10
  Administered 2015-01-10: 500 [IU] via INTRAVENOUS
  Filled 2015-01-10: qty 5

## 2015-01-10 NOTE — Progress Notes (Signed)
[name] presented for labwork. Labs per MD order drawn via Portacath located in the left chest wall accessed with  H 20 needle. Good blood return present. Procedure without incident.  Portacath flushed with 58ml NS and 500U/81ml Heparin per protocol and needle removed intact. Patient tolerated procedure well.

## 2015-01-10 NOTE — Progress Notes (Signed)
Capri Bellow, MD [place] [address]   Stage IIIB [phone]) adenocarcinoma of the colon, resection Sept 2010 adjuvant FOLFOX Renal cell carcinoma diagnosed in Sept 2010 s/p nephrectomy  CURRENT THERAPY: Surveillance per NCCN guidelines  INTERVAL HISTORY: SHNEUR WHITTENBURG 69 y.o. male returns for  regular  visit for followup of  Stage IIIB (T3, N2a, M0) adenocarcinoma of colon, S/P descending colon and rectal stump resection in September 2010 followed by chemotherapy consisting of FOLFOX. 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    Adenocarcinoma of colon   06/26/2009 Initial Diagnosis Invasive adenocarcinoma   06/28/2009 Surgery Invasive adenocarcinoma with two lesions, both extending inot the pericolonic adipose tissue, maximum tumor size was 6.0 cm with 4/32 lymph nodes positive for disease. pT3, pN2, pMx   10/16/2009 - 03/20/2010 Chemotherapy FOLFOX x 12 cycles   He realistically has no major complaints. He notes worsening shortness of breath during the "pollen season." He states this occurs every year and is currently improved. His last CT scans were in September 2015. He is now 5 years out from both his colonaAnd his renal cell cancers.  I reviewed his last colonoscopy from 2011 and noted it was a poor prep a polyp was also found. He was for recall in 3 years and is therefore overdue.  Past Medical History  Diagnosis Date  . Adenocarcinoma of colon 2010    Stage IIIB (T3, N2a, M0), colectomy and chemotherapy with FOLFOX  . Type 2 diabetes mellitus   . Chronic atrial fibrillation   . Neuropathy   . Port catheter in place   . Thoracic aortic aneurysm   . CKD (chronic kidney disease) stage 2, GFR 60-89 ml/min   . Essential hypertension   . Mixed hyperlipidemia   . 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     has No Known Allergies.  We administered heparin lock flush and sodium chloride.  Past Surgical History  Procedure Laterality Date  . Colectomy    . Nephrectomy      Denies any headaches, dizziness, double vision, fevers, chills, night sweats, nausea, vomiting, diarrhea, constipation, chest pain, heart palpitations, shortness of breath, blood in stool, black tarry stool, urinary pain, urinary burning, urinary frequency, hematuria.   PHYSICAL EXAMINATION  ECOG PERFORMANCE STATUS: 0 - Asymptomatic  Filed Vitals:   01/10/15 1337  BP: 109/95  Pulse: 75  Temp: 99.5 F (37.5 C)  Resp: 18    GENERAL:alert, no distress, well nourished, well developed, comfortable, cooperative, obese and smiling SKIN: skin color, texture, turgor are normal, no rashes or significant lesions HEAD: Normocephalic, No masses, lesions, tenderness or abnormalities EYES: normal, PERRLA, EOMI, Conjunctiva are pink and non-injected EARS: External ears normal OROPHARYNX:mucous membranes are moist  NECK: supple, no adenopathy, trachea midline LYMPH:  no palpable lymphadenopathy, no hepatosplenomegaly BREAST:not examined LUNGS: clear to auscultation  HEART: regular rate & rhythm, no murmurs and no gallops ABDOMEN:abdomen soft, non-tender, obese, normal bowel sounds and colostomy producing stool appropriately BACK: Back symmetric, no curvature. EXTREMITIES:less then 2 second capillary refill, no joint deformities, effusion, or inflammation, no skin discoloration, no cyanosis  NEURO: alert & oriented x 3 with fluent speech, no focal motor/sensory deficits, gait normal   LABORATORY DATA: CBC    Component  Value Date/Time   WBC 6.5 01/10/2015 1430   RBC 4.39 01/10/2015 1430   HGB 13.1 01/10/2015 1430   HCT 40.0 01/10/2015 1430   PLT 205 01/10/2015 1430   MCV 91.1 01/10/2015 1430   MCH 29.8 01/10/2015 1430   MCHC 32.8 01/10/2015 1430   RDW 13.5  01/10/2015 1430   LYMPHSABS 1.1 01/10/2015 1430   MONOABS 0.8 01/10/2015 1430   EOSABS 0.2 01/10/2015 1430   BASOSABS 0.0 01/10/2015 1430      Chemistry      Component Value Date/Time   NA 137 12/12/2014 1210   K 4.5 12/12/2014 1210   CL 103 12/12/2014 1210   CO2 25 12/12/2014 1210   BUN 15 12/12/2014 1210   CREATININE 1.41* 12/12/2014 1210      Component Value Date/Time   CALCIUM 9.1 12/12/2014 1210   ALKPHOS 51 12/12/2014 1210   AST 14 12/12/2014 1210   ALT 23 12/12/2014 1210   BILITOT 0.5 12/12/2014 1210     Lab Results  Component Value Date   CEA 3.0 12/12/2014    Lab Results  Component Value Date   HGBA1C 6.0* 11/26/2013    RADIOGRAPHIC STUDIES: CLINICAL DATA: Adenocarcinoma of the colon. Surveillance for colon cancer.  EXAM: CT ABDOMEN AND PELVIS WITHOUT CONTRAST  IMPRESSION: 1. No definite evidence of metastatic disease in the abdomen or pelvis on today's noncontrast CT examination. 2. Status post left hemicolectomy with right lower quadrant colostomy. 3. Status post right nephrectomy. 4. Cholelithiasis without evidence to suggest acute cholecystitis at this time. 5. Mild hepatic steatosis. 6. Additional incidental findings, as above.   Electronically Signed  By: Vinnie Langton M.D.  On: 06/24/2014 09:57  ASSESSMENT:  1. Stage IIIB (T3, N2a, M0) adenocarcinoma of colon, S/P descending colon and rectal stump resection in September 2010 followed by chemotherapy consisting of FOLFOX. 2. 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 3. Chronic renal disease, G3a with GFR of 53 on 11/26/2013 4. Obesity 5. DM 6. COPD, S/P 2 ppd x 34 years 7. Neurodermatitis  8. Atrial fibrillation on Coumadin managed by Dr. Karie Kirks 9. Cholelithiases on [place]  Patient Active Problem List   Diagnosis Date Noted  . History of kidney cancer excluding renal pelvis 06/19/2013  . Port catheter in place 11/16/2012  . Adenocarcinoma of colon  05/31/2011     PLAN:  1. I personally reviewed and went over laboratory results with the patient.  The results are noted within this dictation. 2. I personally reviewed and went over radiographic studies with the patient.  The results are noted within this dictation.    THERAPY PLAN:   We again reviewed the NCCN guidelines in regards to his Colon Cancer and his renal cell carcinoma. I have referred him back to Dr. Gala Romney for colonoscopy as he is overdue. I reemphasized the importance of ongoing surveillance with colonoscopy. In regards to additional imaging studies for his colon cancer I advised him at this point we would consider him cured and per the national guidelines there is no need for ongoing imaging studies.  In regards to his renal cell carcinoma he has reached 5 years, the need for additional imaging at this point is unclear. After long discussion we have opted to proceed with follow-up studies in September, 1 year from his last ones. He will then follow-up with [place] afterwards. If things remain good we will move his surveillance out to yearly.    All questions were answered. The patient  knows to call the clinic with any problems, questions or concerns. We can certainly see the patient much sooner if necessary.   This note was signed electronically  Molli Hazard MD 01/19/2015

## 2015-01-10 NOTE — Patient Instructions (Signed)
[place] at [facility] Discharge Instructions  RECOMMENDATIONS MADE BY THE CONSULTANT AND ANY TEST RESULTS WILL BE SENT TO YOUR REFERRING PHYSICIAN.  Exam and discussion by Dr. Whitney Muse. Will check some additional labs today. Will do scans in September Referral back to Dr. Gala Romney for follow-up Port flushes every 6 weeks Labs and office visit in 6 months. Thank you for choosing [place] at [facility] to provide your oncology and hematology care.  To afford each patient quality time with our provider, please arrive at least 15 minutes before your scheduled appointment time.    You need to re-schedule your appointment should you arrive 10 or more minutes late.  We strive to give you quality time with our providers, and arriving late affects you and other patients whose appointments are after yours.  Also, if you no show three or more times for appointments you may be dismissed from the clinic at the providers discretion.     Again, thank you for choosing [facility].  Our hope is that these requests will decrease the amount of time that you wait before being seen by our physicians.       _____________________________________________________________  Should you have questions after your visit to [facility], please contact our office at [phone] between the hours of 8:30 a.m. and 4:30 p.m.  Voicemails left after 4:30 p.m. will not be returned until the following business day.  For prescription refill requests, have your pharmacy contact our office.

## 2015-01-11 LAB — VITAMIN B12: Vitamin B-12: 387 pg/mL (ref 211–911)

## 2015-01-11 LAB — IRON AND TIBC
Iron: 86 ug/dL (ref 42–165)
Saturation Ratios: 25 % (ref 20–55)
TIBC: 340 ug/dL (ref 215–435)
UIBC: 254 ug/dL (ref 125–400)

## 2015-01-11 LAB — FOLATE: Folate: >20 ng/mL

## 2015-01-11 LAB — FERRITIN: Ferritin: 76 ng/mL (ref 22–322)

## 2015-01-13 ENCOUNTER — Other Ambulatory Visit (HOSPITAL_COMMUNITY): Payer: Self-pay

## 2015-01-13 MED ORDER — IRON-VITAMIN C 100-250 MG PO TABS: 1.0000 | ORAL_TABLET | Freq: Every day | ORAL | Status: DC

## 2015-01-16 ENCOUNTER — Other Ambulatory Visit: Payer: Self-pay

## 2015-01-16 ENCOUNTER — Telehealth: Payer: Self-pay

## 2015-01-16 NOTE — Telephone Encounter (Signed)
Pt was referred by Dr. Whitney Muse for colonoscopy. Last one was 06/28/2009 by Dr. Arnoldo Morale. Hx of anemia and Colon cancer. Appt with Walden Field, NP on 02/01/2015 at 2:00 PM.

## 2015-01-16 NOTE — Telephone Encounter (Signed)
Opened in error

## 2015-01-23 ENCOUNTER — Encounter (HOSPITAL_COMMUNITY): Payer: Medicare Other

## 2015-02-01 ENCOUNTER — Ambulatory Visit (INDEPENDENT_AMBULATORY_CARE_PROVIDER_SITE_OTHER): Payer: Medicare Other | Admitting: Nurse Practitioner

## 2015-02-01 ENCOUNTER — Other Ambulatory Visit: Payer: Self-pay

## 2015-02-01 ENCOUNTER — Encounter: Payer: Self-pay | Admitting: Nurse Practitioner

## 2015-02-01 VITALS — BP 143/97 | HR 81 | Temp 97.5°F | Ht 73.0 in | Wt 310.0 lb

## 2015-02-01 DIAGNOSIS — Z85038 Personal history of other malignant neoplasm of large intestine: Secondary | ICD-10-CM

## 2015-02-01 DIAGNOSIS — C189 Malignant neoplasm of colon, unspecified: Secondary | ICD-10-CM

## 2015-02-01 DIAGNOSIS — Z86010 Personal history of colonic polyps: Secondary | ICD-10-CM

## 2015-02-01 MED ORDER — PEG 3350-KCL-NA BICARB-NACL 420 G PO SOLR: 4000.0000 mL | Freq: Once | ORAL | Status: DC

## 2015-02-01 NOTE — Assessment & Plan Note (Addendum)
Patient with a colonoscopy in 2011 one year status post hemicolectomy for adenocarcinoma of the stomach flexure of the colon and rectum. Single sessile polyp was found at that time and recommended 3 year repeat. Patient states that he somehow felt that the cracks and presents today approximately 5 years post-last colonoscopy. We will proceed with colonoscopy through his colostomy to evaluate as well as rectal stump evaluation. See adenocarcinoma of colon note for further details

## 2015-02-01 NOTE — Assessment & Plan Note (Addendum)
Patient with a history of adenocarcinoma of the splenic flexure as well as the rectum. Status post partial hemicolectomy with a chronic colostomy. His colon is intact to the transverse colon which was anastomosed to the colostomy in the right lower quadrant. Minimal portion of the rectal stump remains. Last colonoscopy 2011 with a single sessile polyp and recommended three-year follow-up. We will proceed with colonoscopy via his colostomy as well as evaluation of the remaining rectal stump.   Proceed with TCS via colostomy and rectal stump evaluation with Dr. Oneida Alar in near future: the risks, benefits, and alternatives have been discussed with the patient in detail. The patient states understanding and desires to proceed.  Patient is on antidiabetic medications, we'll have him take half a dose and I before and then the morning of his procedure. Patient is on Coumadin, will call Dr. Joen Laura for guidance on Coumadin. Patient is on chronic pain medications including Vicodin 2 tabs 4 times a day as needed. We'll perform his procedure with 25 mg preprocedure Phenergan.

## 2015-02-01 NOTE — Patient Instructions (Addendum)
1. We will schedule your procedure (colonoscopy) for you. 2. Further recommendations to be based on results your procedure. 3. We will contact Dr. Joen Laura for guidance on her Coumadin and someone from either there office her our office will contact you with instructions.

## 2015-02-01 NOTE — Progress Notes (Signed)
Primary Care Physician:  Maleki Bellow, MD Primary Gastroenterologist:  Dr. Oneida Alar  Chief Complaint  Patient presents with  . Colonoscopy    HPI:   69 year old male referred by primary care for colonoscopy. Has a history of stage III B colon cancer status post colectomy and chemotherapy in 2010. Last colonoscopy done by Dr. Arnoldo Morale in 2011 which found a sessile polyp in the proximal transverse colon and recommended 3 year repeat.  Today he states he still sees oncology and is scanned every year with CT abdomen/pelvis.Last CT 06/24/14 which showed no definite evidence of metastatic disease in the abdomen and pelvis. Is also seen for routine CEA which was 3.0 (wnl) at last result on 12/12/14. Is also mildly anemia, which has improved on ICAR-C supplementation. Denies any abdominal pain. Has a chronic colostomy and notes there has always been a small amount of blood in his bag due to a small prolapse in the ostomy. Seems to be increasing as of late. Denies recent unintentional weight loss, fever, chills,  dizziness, syncope and near syncope. Admits his energy level has been decreased as of late. Is having some increased ShOB associated with increased pollen counts. Denies any other upper or lower GI symptoms. Is on chronic coumadin for AFib.   Past Medical History  Diagnosis Date  . Adenocarcinoma of colon 2010    Stage IIIB (T3, N2a, M0), colectomy and chemotherapy with FOLFOX  . Type 2 diabetes mellitus   . Chronic atrial fibrillation   . Neuropathy   . Port catheter in place   . Thoracic aortic aneurysm   . CKD (chronic kidney disease) stage 2, GFR 60-89 ml/min   . Essential hypertension   . Mixed hyperlipidemia   . 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 Colectomy    . Nephrectomy      Current Outpatient Prescriptions  Medication Sig Dispense Refill  . diltiazem (CARDIZEM) 60 MG  tablet Take 60 mg by mouth 2 (two) times daily.     Marland Kitchen HYDROcodone-acetaminophen (VICODIN) 5-500 MG per tablet Take 2 tablets by mouth every 4 (four) hours as needed for pain.     . Iron-Vitamin C (ICAR-C) 100-250 MG TABS Take 1 tablet by mouth daily. 30 each 2  . metFORMIN (GLUCOPHAGE) 1000 MG tablet Take 1,000 mg by mouth 2 (two) times daily with a meal.      . Multiple Vitamins-Minerals (COMPLETE MULTIVITAMIN/MINERAL PO) Take 1 tablet by mouth daily.    . pioglitazone (ACTOS) 45 MG tablet Take 45 mg by mouth daily.      . simvastatin (ZOCOR) 40 MG tablet Take 40 mg by mouth every other day.     . Vitamins-Lipotropics (EAR HEALTH PLUS PO) Take 1 tablet by mouth daily.    Marland Kitchen warfarin (COUMADIN) 5 MG tablet Take 5-10 mg by mouth See admin instructions. Take 5 mg for 6 days then on day 7 take 10 mg     No current facility-administered medications for this visit.    Allergies as of 02/01/2015  . (No Known Allergies)    Family History  Problem Relation Age of Onset  . Breast cancer Mother   . Colon cancer Brother   . Colon cancer Maternal Grandfather     History   Social History  . Marital Status: Married    Spouse Name: N/A  . Number of Children: N/A  .  Years of Education: N/A   Occupational History  . Not on file.   Social History Main Topics  . Smoking status: Former Smoker    Types: Cigarettes  . Smokeless tobacco: Never Used  . Alcohol Use: 0.0 oz/week    0 Standard drinks or equivalent per week     Comment: Occasional gin and tonic  . Drug Use: No  . Sexual Activity: Not on file   Other Topics Concern  . Not on file   Social History Narrative    Review of Systems: General: Negative for anorexia, weight loss, fever, chills. Eyes: Negative for vision changes.  ENT: Negative for hoarseness, difficulty swallowing. CV: Negative for chest pain, angina, palpitations, peripheral edema.  Respiratory: Negative for dyspnea at rest, cough, wheezing.  GI: See history of  present illness. MS: Negative for joint pain, low back pain.  Derm: Negative for rash or itching.  Neuro: Negative for weakness, seizure, memory loss, confusion.  Psych: Negative for anxiety, depression.  Endo: Negative for unusual weight change.  Heme: Negative for bruising or bleeding. Allergy: Negative for rash or hives.    Physical Exam: BP 143/97 mmHg  Pulse 81  Temp(Src) 97.5 F (36.4 C) (Oral)  Ht 6\' 1"  (1.854 m)  Wt 310 lb (140.615 kg)  BMI 40.91 kg/m2 General:   Alert and oriented. Pleasant and cooperative. Well-nourished and well-developed.  Head:  Normocephalic and atraumatic. Eyes:  Without icterus, sclera clear and conjunctiva pink.  Ears:  Normal auditory acuity. Nose:  No deformity, discharge,  or lesions. Mouth:  No deformity or lesions, oral mucosa pink.  Neck:  Supple, without mass or thyromegaly. Lungs:  Clear to auscultation bilaterally. No wheezes, rales, or rhonchi. No distress.  Heart:  S1, S2 present without murmurs appreciated.  Abdomen:  +BS, soft, non-tender and non-distended. No HSM noted. No guarding or rebound. No masses appreciated.  Rectal:  Deferred  Msk:  Symmetrical without gross deformities. Normal posture. Pulses:  Normal pulses noted. Extremities:  Without clubbing or edema. Neurologic:  Alert and  oriented x4;  grossly normal neurologically. Skin:  Intact without significant lesions or rashes. Cervical Nodes:  No significant cervical adenopathy. Psych:  Alert and cooperative. Normal mood and affect.     02/01/2015 2:22 PM

## 2015-02-06 ENCOUNTER — Other Ambulatory Visit: Payer: Self-pay

## 2015-02-06 DIAGNOSIS — Z85038 Personal history of other malignant neoplasm of large intestine: Secondary | ICD-10-CM

## 2015-02-06 DIAGNOSIS — Z86010 Personal history of colonic polyps: Secondary | ICD-10-CM

## 2015-02-08 NOTE — Progress Notes (Signed)
CC'ED TO PCP 

## 2015-02-16 ENCOUNTER — Ambulatory Visit: Admit: 2015-02-16 | Payer: Self-pay | Admitting: Internal Medicine

## 2015-02-16 SURGERY — COLONOSCOPY
Anesthesia: Moderate Sedation

## 2015-02-17 ENCOUNTER — Encounter (HOSPITAL_COMMUNITY): Payer: Self-pay | Admitting: Emergency Medicine

## 2015-02-17 ENCOUNTER — Ambulatory Visit (HOSPITAL_COMMUNITY)
Admission: RE | Admit: 2015-02-17 | Discharge: 2015-02-17 | Disposition: A | Discharge status: Home / Self Care | Payer: Medicare Other | Source: Ambulatory Visit | Attending: Gastroenterology | Admitting: Gastroenterology

## 2015-02-17 ENCOUNTER — Encounter (HOSPITAL_COMMUNITY)
Admission: RE | Disposition: A | Discharge status: Home / Self Care | Payer: Self-pay | Source: Ambulatory Visit | Attending: Gastroenterology

## 2015-02-17 DIAGNOSIS — Z860100 Personal history of colon polyps, unspecified: Secondary | ICD-10-CM

## 2015-02-17 DIAGNOSIS — Z85048 Personal history of other malignant neoplasm of rectum, rectosigmoid junction, and anus: Secondary | ICD-10-CM | POA: Diagnosis present

## 2015-02-17 DIAGNOSIS — E119 Type 2 diabetes mellitus without complications: Secondary | ICD-10-CM | POA: Diagnosis not present

## 2015-02-17 DIAGNOSIS — I482 Chronic atrial fibrillation: Secondary | ICD-10-CM | POA: Diagnosis not present

## 2015-02-17 DIAGNOSIS — Z905 Acquired absence of kidney: Secondary | ICD-10-CM | POA: Diagnosis not present

## 2015-02-17 DIAGNOSIS — E782 Mixed hyperlipidemia: Secondary | ICD-10-CM | POA: Insufficient documentation

## 2015-02-17 DIAGNOSIS — L98499 Non-pressure chronic ulcer of skin of other sites with unspecified severity: Secondary | ICD-10-CM | POA: Diagnosis not present

## 2015-02-17 DIAGNOSIS — I712 Thoracic aortic aneurysm, without rupture: Secondary | ICD-10-CM | POA: Diagnosis not present

## 2015-02-17 DIAGNOSIS — Z7901 Long term (current) use of anticoagulants: Secondary | ICD-10-CM | POA: Diagnosis not present

## 2015-02-17 DIAGNOSIS — Z87891 Personal history of nicotine dependence: Secondary | ICD-10-CM | POA: Diagnosis not present

## 2015-02-17 DIAGNOSIS — Z85528 Personal history of other malignant neoplasm of kidney: Secondary | ICD-10-CM | POA: Insufficient documentation

## 2015-02-17 DIAGNOSIS — N182 Chronic kidney disease, stage 2 (mild): Secondary | ICD-10-CM | POA: Insufficient documentation

## 2015-02-17 DIAGNOSIS — Z85038 Personal history of other malignant neoplasm of large intestine: Secondary | ICD-10-CM | POA: Diagnosis not present

## 2015-02-17 DIAGNOSIS — D122 Benign neoplasm of ascending colon: Secondary | ICD-10-CM | POA: Diagnosis not present

## 2015-02-17 DIAGNOSIS — I129 Hypertensive chronic kidney disease with stage 1 through stage 4 chronic kidney disease, or unspecified chronic kidney disease: Secondary | ICD-10-CM | POA: Diagnosis not present

## 2015-02-17 HISTORY — PX: COLONOSCOPY: SHX5424

## 2015-02-17 LAB — GLUCOSE, CAPILLARY: GLUCOSE-CAPILLARY: 125 mg/dL — AB (ref 65–99)

## 2015-02-17 SURGERY — COLONOSCOPY
Anesthesia: Moderate Sedation

## 2015-02-17 MED ORDER — MEPERIDINE HCL 100 MG/ML IJ SOLN
INTRAMUSCULAR | Status: AC
  Filled 2015-02-17: qty 2

## 2015-02-17 MED ORDER — SODIUM CHLORIDE 0.9 % IV SOLN
INTRAVENOUS | Status: DC
  Administered 2015-02-17: 20 mL/h via INTRAVENOUS

## 2015-02-17 MED ORDER — MEPERIDINE HCL 100 MG/ML IJ SOLN
INTRAMUSCULAR | Status: DC | PRN
  Administered 2015-02-17 (×3): 25 mg via INTRAVENOUS

## 2015-02-17 MED ORDER — MIDAZOLAM HCL 5 MG/5ML IJ SOLN
INTRAMUSCULAR | Status: AC
  Filled 2015-02-17: qty 10

## 2015-02-17 MED ORDER — STERILE WATER FOR IRRIGATION IR SOLN
Status: DC | PRN
  Administered 2015-02-17: 15:00:00

## 2015-02-17 MED ORDER — MIDAZOLAM HCL 5 MG/5ML IJ SOLN
INTRAMUSCULAR | Status: DC | PRN
  Administered 2015-02-17 (×2): 2 mg via INTRAVENOUS
  Administered 2015-02-17: 1 mg via INTRAVENOUS
  Administered 2015-02-17: 2 mg via INTRAVENOUS

## 2015-02-17 NOTE — Discharge Instructions (Signed)
You had 5 small polyp removed. You have 30 CM AND 10 CM OF RECTUM REMAINING. YOU HAVE GRANULATION TISSUES AT THE OSTOMY SITE WHICH WILLBLEED PERIODICALLY ESPECIALLY WHILE TAKING COUMADIN.   FOLLOW A HIGH FIBER DIET. AVOID ITEMS THAT CAUSE BLOATING AND GAS. SEE INFO BELOW.  HOLD COUMADIN. RE-START MAY 25.  YOUR BIOPSY RESULTS WILL BE AVAILABLE IN MY CHART AFTER MAY 24 AND MY OFFICE WILL CONTACT YOU IN 10-14 DAYS WITH YOUR RESULTS.   Next colonoscopy & ANOSCOPY in 3 years.  Colonoscopy Care After Read the instructions outlined below and refer to this sheet in the next week. These discharge instructions provide you with general information on caring for yourself after you leave the hospital. While your treatment has been planned according to the most current medical practices available, unavoidable complications occasionally occur. If you have any problems or questions after discharge, call DR. Faruq Rosenberger, [phone].  ACTIVITY  You may resume your regular activity, but move at a slower pace for the next 24 hours.   Take frequent rest periods for the next 24 hours.   Walking will help get rid of the air and reduce the bloated feeling in your belly (abdomen).   No driving for 24 hours (because of the medicine (anesthesia) used during the test).   You may shower.   Do not sign any important legal documents or operate any machinery for 24 hours (because of the anesthesia used during the test).    NUTRITION  Drink plenty of fluids.   You may resume your normal diet as instructed by your doctor.   Begin with a light meal and progress to your normal diet. Heavy or fried foods are harder to digest and may make you feel sick to your stomach (nauseated).   Avoid alcoholic beverages for 24 hours or as instructed.    MEDICATIONS  You may resume your normal medications.   WHAT YOU CAN EXPECT TODAY  Some feelings of bloating in the abdomen.   Passage of more gas than usual.    Spotting of blood in your stool or on the toilet paper  .  IF YOU HAD POLYPS REMOVED DURING THE COLONOSCOPY:  Eat a soft diet IF YOU HAVE NAUSEA, BLOATING, ABDOMINAL PAIN, OR VOMITING.    FINDING OUT THE RESULTS OF YOUR TEST Not all test results are available during your visit. DR. Oneida Alar WILL CALL YOU WITHIN 14 DAYS OF YOUR PROCEDUE WITH YOUR RESULTS. Do not assume everything is normal if you have not heard from DR. Teala Daffron, CALL HER OFFICE AT [phone].  SEEK IMMEDIATE MEDICAL ATTENTION AND CALL THE OFFICE: [phone] IF:  You have more than a spotting of blood in your stool.   Your belly is swollen (abdominal distention).   You are nauseated or vomiting.   You have a temperature over 101F.   You have abdominal pain or discomfort that is severe or gets worse throughout the day.   High-Fiber Diet A high-fiber diet changes your normal diet to include more whole grains, legumes, fruits, and vegetables. Changes in the diet involve replacing refined carbohydrates with unrefined foods. The calorie level of the diet is essentially unchanged. The Dietary Reference Intake (recommended amount) for adult males is 38 grams per day. For adult females, it is 25 grams per day. Pregnant and lactating women should consume 28 grams of fiber per day. Fiber is the intact part of a plant that is not broken down during digestion. Functional fiber is fiber that has been isolated from  the plant to provide a beneficial effect in the body. PURPOSE  Increase stool bulk.   Ease and regulate bowel movements.   Lower cholesterol.  INDICATIONS THAT YOU NEED MORE FIBER  Constipation and hemorrhoids.   Uncomplicated diverticulosis (intestine condition) and irritable bowel syndrome.   Weight management.   As a protective measure against hardening of the arteries (atherosclerosis), diabetes, and cancer.   GUIDELINES FOR INCREASING FIBER IN THE DIET  Start adding fiber to the diet slowly. A  gradual increase of about 5 more grams (2 slices of whole-wheat bread, 2 servings of most fruits or vegetables, or 1 bowl of high-fiber cereal) per day is best. Too rapid an increase in fiber may result in constipation, flatulence, and bloating.   Drink enough water and fluids to keep your urine clear or pale yellow. Water, juice, or caffeine-free drinks are recommended. Not drinking enough fluid may cause constipation.   Eat a variety of high-fiber foods rather than one type of fiber.   Try to increase your intake of fiber through using high-fiber foods rather than fiber pills or supplements that contain small amounts of fiber.   The goal is to change the types of food eaten. Do not supplement your present diet with high-fiber foods, but replace foods in your present diet.  INCLUDE A VARIETY OF FIBER SOURCES  Replace refined and processed grains with whole grains, canned fruits with fresh fruits, and incorporate other fiber sources. White rice, white breads, and most bakery goods contain little or no fiber.   Brown whole-grain rice, buckwheat oats, and many fruits and vegetables are all good sources of fiber. These include: broccoli, Brussels sprouts, cabbage, cauliflower, beets, sweet potatoes, white potatoes (skin on), carrots, tomatoes, eggplant, squash, berries, fresh fruits, and dried fruits.   Cereals appear to be the richest source of fiber. Cereal fiber is found in whole grains and bran. Bran is the fiber-rich outer coat of cereal grain, which is largely removed in refining. In whole-grain cereals, the bran remains. In breakfast cereals, the largest amount of fiber is found in those with "bran" in their names. The fiber content is sometimes indicated on the label.   You may need to include additional fruits and vegetables each day.   In baking, for 1 cup white flour, you may use the following substitutions:   1 cup whole-wheat flour minus 2 tablespoons.   1/2 cup white flour plus 1/2  cup whole-wheat flour.   Polyps, Colon  A polyp is extra tissue that grows inside your body. Colon polyps grow in the large intestine. The large intestine, also called the colon, is part of your digestive system. It is a long, hollow tube at the end of your digestive tract where your body makes and stores stool. Most polyps are not dangerous. They are benign. This means they are not cancerous. But over time, some types of polyps can turn into cancer. Polyps that are smaller than a pea are usually not harmful. But larger polyps could someday become or may already be cancerous. To be safe, doctors remove all polyps and test them.    PREVENTION There is not one sure way to prevent polyps. You might be able to lower your risk of getting them if you:  Eat more fruits and vegetables and less fatty food.   Do not smoke.   Avoid alcohol.   Exercise every day.   Lose weight if you are overweight.   Eating more calcium and folate can also lower  your risk of getting polyps. Some foods that are rich in calcium are milk, cheese, and broccoli. Some foods that are rich in folate are chickpeas, kidney beans, and spinach.

## 2015-02-17 NOTE — Op Note (Signed)
[facility] [address] [place], [address]   COLONOSCOPY PROCEDURE REPORT  PATIENT: [name], [name]  MR#: [id number] BIRTHDATE: [birth year] , 68  yrs. old GENDER: male ENDOSCOPIST: Danie Binder, MD REFERRED MB:OMQTT Karie Kirks, M.D.  Ancil Linsey, MD PROCEDURE DATE:  02-25-15 PROCEDURE:   Colonoscopy with snare polypectomy,  biopsy, and cold biopsy polypectomy  AND ANOSCOPY INDICATIONS:high risk patient with personal history of rectal cancer. MEDICATIONS: Demerol 75 mg IV and Versed 6 mg IV  DESCRIPTION OF PROCEDURE:    Physical exam was performed.  Informed consent was obtained from the patient after explaining the benefits, risks, and alternatives to procedure.  The patient was connected to monitor and placed in left lateral position. Continuous oxygen was provided by nasal cannula and IV medicine administered through an indwelling cannula.  After administration of sedation and rectal exam, the patients OSTOMY was intubated and the EC-3890Li ([id number])  colonoscope was advanced under direct visualization to the cecum.  The scope was removed slowly by carefully examining the color, texture, anatomy, and integrity mucosa on the way out. After rectal exam, the patients RECTUM was intubated and the EC-3890Li ([id number])  GASTROSCOPE was advanced under direct visualization to the [place].   The patient was recovered in endoscopy and discharged home in satisfactory condition.    COLON FINDINGS: 30 CM COLON REMAINS.  10 CM OF RECTUM REMAINS. RECTUM FRIABLE WITH SMALL AMOUNT OF RETAINED STOOL/MUCOUS.  NO MASS, POLYPS, OR HEMORRHOIDS APPRECIATED, Three sessile polyps ranging from 6 to 33mm in size were found in the ascending colon.  A polypectomy was performed using snare cautery.  , Two sessile polyps ranging from 2 to 6mm in size were found in the ascending colon.  A polypectomy was performed with cold forceps.  , and GRANULATION TISSUE AT OSTOMY  SITE BIOPSIED VIA COLD FORCEPS.  PREP QUALITY: good.  CECAL W/D TIME: 15       minutes COMPLICATIONS: None  ENDOSCOPIC IMPRESSION: 1.   30 CM COLON REMAINS.  10 CM OF RECTUM REMAINS. 2.   FIVE COLON Polyps REMOVED r 4.   FRIABLE GRANULATION TISSUE AT OSTOMY SITE  RECOMMENDATIONS: FOLLOW A HIGH FIBER DIET. HOLD COUMADIN.  RE-START MAY 25. AWAIT BIOPSY RESULTS. Next colonoscopy & ANOSCOPY in 3 years.    _______________________________ eSignedDanie Binder, MD 2015/02/25 5:26 PM    CPT CODES: ICD CODES:  The ICD and CPT codes recommended by this software are interpretations from the data that the clinical staff has captured with the software.  The verification of the translation of this report to the ICD and CPT codes and modifiers is the sole responsibility of the health care institution and practicing physician where this report was generated.  [employer]. will not be held responsible for the validity of the ICD and CPT codes included on this report.  AMA assumes no liability for data contained or not contained herein. C[employer] is a Designer, television/film set of the [employer].

## 2015-02-17 NOTE — Progress Notes (Signed)
REVIEWED-NO ADDITIONAL RECOMMENDATIONS. 

## 2015-02-17 NOTE — H&P (Signed)
Primary Care Physician:  Eshan Bellow, MD Primary Gastroenterologist:  Dr. Oneida Alar  Pre-Procedure History & Physical: HPI:  [name] is a 69 y.o. male here for PERSONAL HISTORY OF POLYPS/colon cancer(2010).  Past Medical History  Diagnosis Date  . Adenocarcinoma of colon 2010    Stage IIIB (T3, N2a, M0), colectomy and chemotherapy with FOLFOX  . Type 2 diabetes mellitus   . Chronic atrial fibrillation   . Neuropathy   . Port catheter in place   . Thoracic aortic aneurysm   . CKD (chronic kidney disease) stage 2, GFR 60-89 ml/min   . Essential hypertension   . Mixed hyperlipidemia   . 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 Colectomy    . Nephrectomy      Prior to Admission medications   Medication Sig Start Date End Date Taking? Authorizing Provider  diltiazem (CARDIZEM) 60 MG tablet Take 60 mg by mouth 2 (two) times daily.     Historical Provider, MD  HYDROcodone-acetaminophen (VICODIN) 5-500 MG per tablet Take 2 tablets by mouth every 4 (four) hours as needed for pain.     Historical Provider, MD  Iron-Vitamin C (ICAR-C) 100-250 MG TABS Take 1 tablet by mouth daily. 01/13/15   Patrici Ranks, MD  metFORMIN (GLUCOPHAGE) 1000 MG tablet Take 1,000 mg by mouth 2 (two) times daily with a meal.      Historical Provider, MD  Multiple Vitamins-Minerals (COMPLETE MULTIVITAMIN/MINERAL PO) Take 1 tablet by mouth daily.    Historical Provider, MD  pioglitazone (ACTOS) 45 MG tablet Take 45 mg by mouth daily.      Historical Provider, MD  polyethylene glycol-electrolytes (NULYTELY/GOLYTELY) 420 G solution Take 4,000 mLs by mouth once. 02/01/15   Carlis Stable, NP  simvastatin (ZOCOR) 40 MG tablet Take 40 mg by mouth every other day.     Historical Provider, MD  Vitamins-Lipotropics (EAR HEALTH PLUS PO) Take 1 tablet by mouth daily.    Historical Provider, MD  warfarin (COUMADIN) 5 MG tablet  Take 5-10 mg by mouth See admin instructions. Take 5 mg for 6 days then on day 7 take 10 mg    Historical Provider, MD    Allergies as of 02/06/2015  . (No Known Allergies)    Family History  Problem Relation Age of Onset  . Breast cancer Mother   . Colon cancer Brother   . Colon cancer Maternal Grandfather     History   Social History  . Marital Status: Married    Spouse Name: N/A  . Number of Children: N/A  . Years of Education: N/A   Occupational History  . Not on file.   Social History Main Topics  . Smoking status: Former Smoker    Types: Cigarettes  . Smokeless tobacco: Never Used  . Alcohol Use: 0.0 oz/week    0 Standard drinks or equivalent per week     Comment: Occasional gin and tonic  . Drug Use: No  . Sexual Activity: Not on file   Other Topics Concern  . Not on file   Social History Narrative    Review of Systems: See HPI, otherwise negative ROS   Physical Exam: There were no vitals taken for this visit. General:   Alert,  pleasant and cooperative in NAD Head:  Normocephalic and atraumatic. Neck:  Supple; Lungs:  Clear throughout to auscultation.    Heart:  Regular rate and rhythm. Abdomen:  Soft, nontender and nondistended. Normal bowel sounds, without guarding, and without rebound.   Neurologic:  Alert and  oriented x4;  grossly normal neurologically.  Impression/Plan:   PERSONAL HISTORY OF POLYPS/colon cancer(2010).  PLAN: 1. TCS TODAY

## 2015-02-21 ENCOUNTER — Encounter (HOSPITAL_COMMUNITY): Payer: Self-pay

## 2015-02-21 ENCOUNTER — Encounter (HOSPITAL_COMMUNITY): Payer: Medicare Other | Attending: Hematology and Oncology

## 2015-02-21 DIAGNOSIS — Z85038 Personal history of other malignant neoplasm of large intestine: Secondary | ICD-10-CM

## 2015-02-21 DIAGNOSIS — Z452 Encounter for adjustment and management of vascular access device: Secondary | ICD-10-CM

## 2015-02-21 DIAGNOSIS — Z9889 Other specified postprocedural states: Secondary | ICD-10-CM | POA: Insufficient documentation

## 2015-02-21 MED ORDER — SODIUM CHLORIDE 0.9 % IJ SOLN
10.0000 mL | INTRAMUSCULAR | Status: AC | PRN
  Administered 2015-02-21: 10 mL via INTRAVENOUS
  Filled 2015-02-21: qty 10

## 2015-02-21 MED ORDER — HEPARIN SOD (PORK) LOCK FLUSH 100 UNIT/ML IV SOLN
500.0000 [IU] | Freq: Once | INTRAVENOUS | Status: AC
  Administered 2015-02-21: 500 [IU] via INTRAVENOUS

## 2015-02-21 NOTE — Patient Instructions (Signed)
[facility] at [facility] Discharge Instructions  RECOMMENDATIONS MADE BY THE CONSULTANT AND ANY TEST RESULTS WILL BE SENT TO YOUR REFERRING PHYSICIAN.  Port flush today. Return as scheduled for port flushes and office visit.  Thank you for choosing [facility] at [facility] to provide your oncology and hematology care.  To afford each patient quality time with our provider, please arrive at least 15 minutes before your scheduled appointment time.    You need to re-schedule your appointment should you arrive 10 or more minutes late.  We strive to give you quality time with our providers, and arriving late affects you and other patients whose appointments are after yours.  Also, if you no show three or more times for appointments you may be dismissed from the clinic at the providers discretion.     Again, thank you for choosing [facility].  Our hope is that these requests will decrease the amount of time that you wait before being seen by our physicians.       _____________________________________________________________  Should you have questions after your visit to [facility], please contact our office at [phone] between the hours of 8:30 a.m. and 4:30 p.m.  Voicemails left after 4:30 p.m. will not be returned until the following business day.  For prescription refill requests, have your pharmacy contact our office.    

## 2015-02-21 NOTE — Addendum Note (Signed)
Addended by: Joie Bimler on: 02/21/2015 03:05 PM   Modules accepted: Orders, SmartSet

## 2015-03-08 ENCOUNTER — Telehealth: Payer: Self-pay | Admitting: Gastroenterology

## 2015-03-08 NOTE — Telephone Encounter (Signed)
REMINDER IN EPIC °

## 2015-03-08 NOTE — Telephone Encounter (Signed)
Please call pt. HE had simple adenomas removed from HIS colon.   FOLLOW A HIGH FIBER DIET. AVOID ITEMS THAT CAUSE BLOATING AND GAS.   Next colonoscopy & ANOSCOPY in 3 years.

## 2015-03-09 NOTE — Telephone Encounter (Signed)
LMOM to call.

## 2015-03-09 NOTE — Telephone Encounter (Signed)
Tried to call with no anwser

## 2015-03-10 NOTE — Telephone Encounter (Signed)
Letter mailed to call.  

## 2015-03-13 NOTE — Telephone Encounter (Signed)
Pt is aware of results. 

## 2015-04-04 ENCOUNTER — Encounter (HOSPITAL_COMMUNITY): Payer: Medicare Other | Attending: Hematology and Oncology

## 2015-04-04 VITALS — BP 143/85 | HR 51 | Temp 99.1°F | Resp 22 | Wt 292.5 lb

## 2015-04-04 DIAGNOSIS — Z452 Encounter for adjustment and management of vascular access device: Secondary | ICD-10-CM

## 2015-04-04 DIAGNOSIS — Z85528 Personal history of other malignant neoplasm of kidney: Secondary | ICD-10-CM

## 2015-04-04 DIAGNOSIS — Z95828 Presence of other vascular implants and grafts: Secondary | ICD-10-CM

## 2015-04-04 DIAGNOSIS — Z85038 Personal history of other malignant neoplasm of large intestine: Secondary | ICD-10-CM | POA: Diagnosis not present

## 2015-04-04 DIAGNOSIS — Z9889 Other specified postprocedural states: Secondary | ICD-10-CM | POA: Insufficient documentation

## 2015-04-04 MED ORDER — HEPARIN SOD (PORK) LOCK FLUSH 100 UNIT/ML IV SOLN
500.0000 [IU] | Freq: Once | INTRAVENOUS | Status: AC
  Administered 2015-04-04: 500 [IU] via INTRAVENOUS
  Filled 2015-04-04: qty 5

## 2015-04-04 MED ORDER — SODIUM CHLORIDE 0.9 % IJ SOLN
10.0000 mL | INTRAMUSCULAR | Status: DC | PRN
  Administered 2015-04-04: 10 mL via INTRAVENOUS
  Filled 2015-04-04: qty 10

## 2015-04-04 MED ORDER — SODIUM CHLORIDE 0.9 % IJ SOLN
10.0000 mL | Freq: Once | INTRAMUSCULAR | Status: AC
  Administered 2015-04-04: 10 mL via INTRAVENOUS

## 2015-04-04 NOTE — Patient Instructions (Signed)
[facility] at [facility] Discharge Instructions  RECOMMENDATIONS MADE BY THE CONSULTANT AND ANY TEST RESULTS WILL BE SENT TO YOUR REFERRING PHYSICIAN.  Port flush today as scheduled. Continue port flush every 6 weeks. Return as scheduled.  Thank you for choosing  [facility] to provide your oncology and hematology care.  To afford each patient quality time with our provider, please arrive at least 15 minutes before your scheduled appointment time.    You need to re-schedule your appointment should you arrive 10 or more minutes late.  We strive to give you quality time with our providers, and arriving late affects you and other patients whose appointments are after yours.  Also, if you no show three or more times for appointments you may be dismissed from the clinic at the providers discretion.     Again, thank you for choosing [facility].  Our hope is that these requests will decrease the amount of time that you wait before being seen by our physicians.       _____________________________________________________________  Should you have questions after your visit to [facility], please contact our office at [phone] between the hours of 8:30 a.m. and 4:30 p.m.  Voicemails left after 4:30 p.m. will not be returned until the following business day.  For prescription refill requests, have your pharmacy contact our office.    

## 2015-04-04 NOTE — Progress Notes (Signed)
[name] presented for Portacath access and flush. Proper placement of portacath confirmed by CXR. Portacath located left chest wall accessed with  H 20 needle. Good blood return present. Portacath flushed with 65ml NS and 500U/1ml Heparin and needle removed intact. Procedure without incident. Patient tolerated procedure well.

## 2015-05-16 ENCOUNTER — Encounter (HOSPITAL_COMMUNITY): Payer: Medicare Other | Attending: Hematology & Oncology

## 2015-05-16 DIAGNOSIS — Z85038 Personal history of other malignant neoplasm of large intestine: Secondary | ICD-10-CM | POA: Diagnosis not present

## 2015-05-16 DIAGNOSIS — Z452 Encounter for adjustment and management of vascular access device: Secondary | ICD-10-CM | POA: Diagnosis present

## 2015-05-16 DIAGNOSIS — Z85528 Personal history of other malignant neoplasm of kidney: Secondary | ICD-10-CM

## 2015-05-16 MED ORDER — SODIUM CHLORIDE 0.9 % IJ SOLN
10.0000 mL | INTRAMUSCULAR | Status: DC | PRN
  Administered 2015-05-16: 10 mL via INTRAVENOUS
  Filled 2015-05-16: qty 10

## 2015-05-16 MED ORDER — HEPARIN SOD (PORK) LOCK FLUSH 100 UNIT/ML IV SOLN
500.0000 [IU] | Freq: Once | INTRAVENOUS | Status: AC
  Administered 2015-05-16: 500 [IU] via INTRAVENOUS
  Filled 2015-05-16: qty 5

## 2015-05-16 NOTE — Progress Notes (Signed)
[name] presented for Portacath access and flush. Proper placement of portacath confirmed by CXR. Portacath located left chest wall accessed with  H 20 needle. Good blood return present. Portacath flushed with 63ml NS and 500U/20ml Heparin and needle removed intact. Procedure without incident. Patient tolerated procedure well.

## 2015-07-03 ENCOUNTER — Other Ambulatory Visit (HOSPITAL_COMMUNITY): Payer: Self-pay | Admitting: Hematology & Oncology

## 2015-07-03 ENCOUNTER — Ambulatory Visit (HOSPITAL_COMMUNITY)
Admission: RE | Admit: 2015-07-03 | Discharge: 2015-07-03 | Disposition: A | Discharge status: Home / Self Care | Payer: Medicare Other | Source: Ambulatory Visit | Attending: Hematology & Oncology | Admitting: Hematology & Oncology

## 2015-07-03 DIAGNOSIS — Z08 Encounter for follow-up examination after completed treatment for malignant neoplasm: Secondary | ICD-10-CM | POA: Diagnosis present

## 2015-07-03 DIAGNOSIS — R590 Localized enlarged lymph nodes: Secondary | ICD-10-CM | POA: Insufficient documentation

## 2015-07-03 DIAGNOSIS — M5136 Other intervertebral disc degeneration, lumbar region: Secondary | ICD-10-CM | POA: Insufficient documentation

## 2015-07-03 DIAGNOSIS — Z9049 Acquired absence of other specified parts of digestive tract: Secondary | ICD-10-CM | POA: Insufficient documentation

## 2015-07-03 DIAGNOSIS — Z933 Colostomy status: Secondary | ICD-10-CM | POA: Insufficient documentation

## 2015-07-03 DIAGNOSIS — R918 Other nonspecific abnormal finding of lung field: Secondary | ICD-10-CM | POA: Insufficient documentation

## 2015-07-03 DIAGNOSIS — Z85528 Personal history of other malignant neoplasm of kidney: Secondary | ICD-10-CM

## 2015-07-03 DIAGNOSIS — C189 Malignant neoplasm of colon, unspecified: Secondary | ICD-10-CM | POA: Insufficient documentation

## 2015-07-03 DIAGNOSIS — N400 Enlarged prostate without lower urinary tract symptoms: Secondary | ICD-10-CM | POA: Insufficient documentation

## 2015-07-03 DIAGNOSIS — Z905 Acquired absence of kidney: Secondary | ICD-10-CM | POA: Insufficient documentation

## 2015-07-03 DIAGNOSIS — C649 Malignant neoplasm of unspecified kidney, except renal pelvis: Secondary | ICD-10-CM | POA: Insufficient documentation

## 2015-07-12 ENCOUNTER — Ambulatory Visit (HOSPITAL_COMMUNITY): Payer: Medicare Other | Admitting: Oncology

## 2015-07-12 ENCOUNTER — Encounter (HOSPITAL_COMMUNITY): Payer: Medicare Other | Attending: Hematology and Oncology

## 2015-07-12 ENCOUNTER — Encounter (HOSPITAL_COMMUNITY): Payer: Self-pay | Admitting: Oncology

## 2015-07-12 ENCOUNTER — Encounter (HOSPITAL_BASED_OUTPATIENT_CLINIC_OR_DEPARTMENT_OTHER): Payer: Medicare Other | Admitting: Oncology

## 2015-07-12 VITALS — BP 116/72 | HR 81 | Temp 98.8°F | Resp 18 | Wt 290.0 lb

## 2015-07-12 DIAGNOSIS — Z85528 Personal history of other malignant neoplasm of kidney: Secondary | ICD-10-CM | POA: Diagnosis not present

## 2015-07-12 DIAGNOSIS — J189 Pneumonia, unspecified organism: Secondary | ICD-10-CM

## 2015-07-12 DIAGNOSIS — L989 Disorder of the skin and subcutaneous tissue, unspecified: Secondary | ICD-10-CM

## 2015-07-12 DIAGNOSIS — Z9889 Other specified postprocedural states: Secondary | ICD-10-CM | POA: Insufficient documentation

## 2015-07-12 DIAGNOSIS — C189 Malignant neoplasm of colon, unspecified: Secondary | ICD-10-CM

## 2015-07-12 DIAGNOSIS — Z85038 Personal history of other malignant neoplasm of large intestine: Secondary | ICD-10-CM | POA: Diagnosis not present

## 2015-07-12 DIAGNOSIS — E119 Type 2 diabetes mellitus without complications: Secondary | ICD-10-CM | POA: Insufficient documentation

## 2015-07-12 DIAGNOSIS — C61 Malignant neoplasm of prostate: Secondary | ICD-10-CM | POA: Insufficient documentation

## 2015-07-12 DIAGNOSIS — R252 Cramp and spasm: Secondary | ICD-10-CM

## 2015-07-12 DIAGNOSIS — I4891 Unspecified atrial fibrillation: Secondary | ICD-10-CM | POA: Insufficient documentation

## 2015-07-12 DIAGNOSIS — D6489 Other specified anemias: Secondary | ICD-10-CM

## 2015-07-12 LAB — CBC WITH DIFFERENTIAL/PLATELET
BASOS ABS: 0 10*3/uL (ref 0.0–0.1)
BASOS PCT: 0 %
Eosinophils Absolute: 0.1 10*3/uL (ref 0.0–0.7)
Eosinophils Relative: 1 %
HEMATOCRIT: 34.2 % — AB (ref 39.0–52.0)
HEMOGLOBIN: 10.9 g/dL — AB (ref 13.0–17.0)
LYMPHS PCT: 13 %
Lymphs Abs: 1.3 10*3/uL (ref 0.7–4.0)
MCH: 29.5 pg (ref 26.0–34.0)
MCHC: 31.9 g/dL (ref 30.0–36.0)
MCV: 92.7 fL (ref 78.0–100.0)
MONO ABS: 0.8 10*3/uL (ref 0.1–1.0)
Monocytes Relative: 8 %
NEUTROS ABS: 7.8 10*3/uL — AB (ref 1.7–7.7)
NEUTROS PCT: 78 %
Platelets: 226 10*3/uL (ref 150–400)
RBC: 3.69 MIL/uL — AB (ref 4.22–5.81)
RDW: 14.4 % (ref 11.5–15.5)
WBC: 9.9 10*3/uL (ref 4.0–10.5)

## 2015-07-12 LAB — COMPREHENSIVE METABOLIC PANEL
ALBUMIN: 3.9 g/dL (ref 3.5–5.0)
ALK PHOS: 49 U/L (ref 38–126)
ALT: 20 U/L (ref 17–63)
AST: 17 U/L (ref 15–41)
Anion gap: 5 (ref 5–15)
BUN: 17 mg/dL (ref 6–20)
CALCIUM: 9 mg/dL (ref 8.9–10.3)
CO2: 26 mmol/L (ref 22–32)
CREATININE: 1.45 mg/dL — AB (ref 0.61–1.24)
Chloride: 104 mmol/L (ref 101–111)
GFR calc Af Amer: 56 mL/min — ABNORMAL LOW (ref >60)
GFR, EST NON AFRICAN AMERICAN: 48 mL/min — AB (ref >60)
Glucose, Bld: 100 mg/dL — ABNORMAL HIGH (ref 65–99)
Potassium: 4.9 mmol/L (ref 3.5–5.1)
Sodium: 135 mmol/L (ref 135–145)
TOTAL PROTEIN: 7.2 g/dL (ref 6.5–8.1)
Total Bilirubin: 0.7 mg/dL (ref 0.3–1.2)

## 2015-07-12 LAB — MAGNESIUM: Magnesium: 1.7 mg/dL (ref 1.7–2.4)

## 2015-07-12 MED ORDER — AMOXICILLIN-POT CLAVULANATE 875-125 MG PO TABS: 1.0000 | ORAL_TABLET | Freq: Two times a day (BID) | ORAL | Status: DC

## 2015-07-12 MED ORDER — HEPARIN SOD (PORK) LOCK FLUSH 100 UNIT/ML IV SOLN
500.0000 [IU] | Freq: Once | INTRAVENOUS | Status: AC
  Administered 2015-07-12: 500 [IU] via INTRAVENOUS
  Filled 2015-07-12: qty 5

## 2015-07-12 MED ORDER — SODIUM CHLORIDE 0.9 % IJ SOLN
10.0000 mL | INTRAMUSCULAR | Status: DC | PRN
  Administered 2015-07-12: 10 mL via INTRAVENOUS
  Filled 2015-07-12: qty 10

## 2015-07-12 NOTE — Assessment & Plan Note (Signed)
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

## 2015-07-12 NOTE — Progress Notes (Signed)
Dwayne Bellow, MD [place] [address]  Adenocarcinoma of colon [facility]) - Plan: CBC with Differential, Comprehensive metabolic panel, CEA, heparin lock flush 100 unit/mL, sodium chloride 0.9 % injection 10 mL, CT Chest W Contrast  History of kidney cancer excluding renal pelvis - Plan: CT Chest W Contrast  Pneumonia, unspecified laterality, unspecified part of lung - Plan: amoxicillin-clavulanate (AUGMENTIN) 875-125 MG tablet, CT Chest W Contrast  Muscle cramping - Plan: Magnesium, Magnesium  CURRENT THERAPY: Surveillance per NCCN guidelines  INTERVAL HISTORY: [name] 69 y.o. male returns for  regular  visit for followup of  Stage IIIB (T3, N2a, M0) adenocarcinoma of colon, S/P descending colon and rectal stump resection in September 2010 followed by chemotherapy consisting of FOLFOX. 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    Adenocarcinoma of colon ([place])   06/26/2009 Initial Diagnosis Invasive adenocarcinoma   06/28/2009 Surgery Invasive adenocarcinoma with two lesions, both extending inot the pericolonic adipose tissue, maximum tumor size was 6.0 cm with 4/32 lymph nodes positive for disease. pT3, pN2, pMx   10/16/2009 - 03/20/2010 Chemotherapy FOLFOX x 12 cycles   I personally reviewed and went over radiographic studies with the patient.  The results are noted within this dictation.  CTs on 10/3 demonstrate scattered ground-glass opacities.  Repeat imaging is recommended.  He reports a wet cough that is non-productive.  This has been ongoing x 2 weeks +.  He denies any fevers or chills.  I personally reviewed and went over laboratory results with the patient.  The results are noted within this dictation.  Labs will be updated today.   Past Medical History  Diagnosis Date  . Adenocarcinoma of colon ([place]) 2010    Stage IIIB (T3, N2a, M0), colectomy and chemotherapy with FOLFOX  . Type 2 diabetes mellitus ([place])    . Chronic atrial fibrillation ([place])   . Neuropathy ([place])   . Port catheter in place   . Thoracic aortic aneurysm ([place])   . CKD (chronic kidney disease) stage 2, GFR 60-89 ml/min   . Essential hypertension   . Mixed hyperlipidemia   . Renal cancer ([place]) 2010    Clear cell renal cancer status post nephrectomy     has Adenocarcinoma of colon ([place]); Port catheter in place; History of kidney cancer excluding renal pelvis; History of colonic polyps; and History of colon cancer on his problem list.     has No Known Allergies.  We administered heparin lock flush and sodium chloride.  Past Surgical History  Procedure Laterality Date  . Colectomy    . Nephrectomy    . Colonoscopy N/A 02/17/2015    Procedure: COLONOSCOPY;  Surgeon: Danie Binder, MD;  Location: [facility];  Service: Endoscopy;  Laterality: N/A;  1400 via colostomy - moved to 2:45 - office to notify    Denies any headaches, dizziness, double vision, fevers, chills, night sweats, nausea, vomiting, diarrhea, constipation, chest pain, heart palpitations, shortness of breath, blood in stool, black tarry stool, urinary pain, urinary burning, urinary frequency, hematuria.   PHYSICAL EXAMINATION  ECOG PERFORMANCE STATUS: 0 - Asymptomatic  Filed Vitals:   07/12/15 1451  BP: 116/72  Pulse: 81  Temp: 98.8 F (37.1 C)  Resp: 18    GENERAL:alert, no distress, well nourished, well developed, comfortable, cooperative, obese and smiling SKIN: skin color, texture, turgor are normal, no rashes.  Raised lesion without erythema on the left posterior parietal section  of head and a raised, excoriated raised lesion with erythema on the left lower back. HEAD: Normocephalic, No masses, lesions, tenderness or abnormalities EYES: normal, PERRLA, EOMI, Conjunctiva are pink and non-injected EARS: External ears normal OROPHARYNX:mucous membranes are moist  NECK: supple, no adenopathy, trachea midline LYMPH:  no palpable lymphadenopathy,  no hepatosplenomegaly BREAST:not examined LUNGS: clear to auscultation  HEART: regular rate & rhythm, no murmurs and no gallops ABDOMEN:abdomen soft, non-tender, obese, normal bowel sounds. BACK: Back symmetric, no curvature. EXTREMITIES:less then 2 second capillary refill, no joint deformities, effusion, or inflammation, no skin discoloration, no cyanosis  NEURO: alert & oriented x 3 with fluent speech, no focal motor/sensory deficits, gait normal   LABORATORY DATA: CBC    Component Value Date/Time   WBC 6.5 01/10/2015 1430   RBC 4.39 01/10/2015 1430   HGB 13.1 01/10/2015 1430   HCT 40.0 01/10/2015 1430   PLT 205 01/10/2015 1430   MCV 91.1 01/10/2015 1430   MCH 29.8 01/10/2015 1430   MCHC 32.8 01/10/2015 1430   RDW 13.5 01/10/2015 1430   LYMPHSABS 1.1 01/10/2015 1430   MONOABS 0.8 01/10/2015 1430   EOSABS 0.2 01/10/2015 1430   BASOSABS 0.0 01/10/2015 1430      Chemistry      Component Value Date/Time   NA 137 12/12/2014 1210   K 4.5 12/12/2014 1210   CL 103 12/12/2014 1210   CO2 25 12/12/2014 1210   BUN 15 12/12/2014 1210   CREATININE 1.41* 12/12/2014 1210      Component Value Date/Time   CALCIUM 9.1 12/12/2014 1210   ALKPHOS 51 12/12/2014 1210   AST 14 12/12/2014 1210   ALT 23 12/12/2014 1210   BILITOT 0.5 12/12/2014 1210     Lab Results  Component Value Date   CEA 3.0 12/12/2014    Lab Results  Component Value Date   HGBA1C 6.0* 11/26/2013      RADIOGRAPHIC STUDIES:  EXAM: CT CHEST, ABDOMEN AND PELVIS WITHOUT CONTRAST  TECHNIQUE: Multidetector CT imaging of the chest, abdomen and pelvis was performed following the standard protocol without IV contrast.  COMPARISON: 06/24/2014 ; 06/21/2013  FINDINGS: CT CHEST FINDINGS  Mediastinum/Nodes: Fluid observed in superior pericardial recess. Left lower paratracheal lymph node 1.1 cm in short axis, image 24 series 2, formerly 0.8 cm. Small right paratracheal lymph nodes are present including a  0.7 cm node on image 20 series 2.  Coronary, aortic arch, and branch vessel atherosclerotic vascular disease. Left central line tip: SVC. Borderline cardiomegaly.  Lungs/Pleura: Mucus observed in the trachea. Scattered new ground-glass opacities with some central and scattered nodularity observed both lungs but especially in the left upper lobe and left lower lobe. In general the central nodular components within the ground-glass density regions are difficult to measure due to indistinct margins. One of the larger index nodular densities is 0.7 by 0.8 cm on image 28 series 6. ; this can serve as an index nodule.  Musculoskeletal: Mild thoracic spondylosis.  CT ABDOMEN PELVIS FINDINGS  Hepatobiliary: Multiple gallstones in the gallbladder measuring up to 6 mm diameter. No biliary dilatation or visualized choledocholithiasis.  Pancreas: Unremarkable  Spleen: Unremarkable  Adrenals/Urinary Tract: Right nephrectomy. No local recurrence along the nephrectomy bed observed. Exophytic 1.4 cm lesion from the left kidney lower pole, internal density 5 Hounsfield units, essentially stable from prior, favoring cyst.  Stomach/Bowel: Right colostomy. Absence of transverse and descending colon. Rectal pouch observed, blind-ending.  Vascular/Lymphatic: Small gastrohepatic, porta hepatis, and retroperitoneal lymph nodes are not pathologically  enlarged. Aortoiliac atherosclerotic vascular disease.  Reproductive: Mildly prominent prostate gland, 5.4 by 4.9 cm on image 127 series 2.  Other: No supplemental non-categorized findings.  Musculoskeletal: Lower lumbar degenerative disc disease most notably at L4-5. In conjunction with facet arthropathy there is suspected foraminal impingement at L3-4, L4-5, and L5-S1. A transitional lumbosacral vertebra is assumed to represent the S1 level. Careful correlation with this numbering strategy prior to any procedural intervention would  be recommended.  IMPRESSION: 1. Scattered ground-glass opacities with some central and scattered nodularity in both lungs but especially the left lung. Appearance is most characteristic for atypical multilobar pneumonia. Short- term follow up chest CT in 4-6 weeks time recommended to ensure clearance recommended. 2. There is some minimal adenopathy in the chest, including a 1.1 cm in short axis left paratracheal lymph node, which also merits attention on followup. 3. Other imaging findings of potential clinical significance: Cholelithiasis. Mildly enlarged prostate gland. Lumbar degenerative disc disease with multilevel impingement. Coronary, aortic arch, and branch vessel atherosclerotic vascular disease. Aortoiliac atherosclerotic vascular disease. Borderline cardiomegaly.   Electronically Signed  By: Van Clines M.D.  On: 07/03/2015 14:29   ASSESSMENT/PLAN:   Adenocarcinoma of colon ([place]) Stage IIIB (T3, N2a, M0) adenocarcinoma of colon, S/P descending colon and rectal stump resection in September 2010 followed by chemotherapy consisting of FOLFOX.  Oncology history is up-to-date.  Colonoscopy was performed by Dr. Oneida Alar on 02/17/2015.  He will need a repeat in 3 years (May 2019).  I personally reviewed and went over radiographic studies with the patient.  The results are noted within this dictation.    Labs today as ordered: CBC diff, CMET, CEA.  I have added a magnesium level as well.  Rx for Augmentin x 10 days.  Repeat CT chest in 6 weeks to follow-up on imaging abnormalities following antibiotic treatment.  I have recommended he see if Dr. Karie Kirks performs skin lesion removals.  Both lesions appear benign.  If not, since they are bothering him, they should be removed and therefore, a referral to dermatology would be recommended.  Return in 2 months for follow-up.  History of kidney cancer excluding renal pelvis Right clear cell renal cancer, S/P  nephrectomy in Sept 2010.    THERAPY PLAN:  In 6 months, if all is well, 5 years worth of surveillance will be completed (following CEA and CT scan in the fall of 2015).  We will then see him annually.    NCCN guidelines for surveillance for T3/T4, N0-2, M0 colon cancer are:   A. H+P every 3-6 months x 2 years and then every 6 months for a total of 5 years   B. CEA every 3 months x 2 years and then every 6 months for a total of 5 years   C. CT abd/pelvis annually for up to 5 years   D. Colonoscopy in 1 year except if no preoperative colonoscopy due to obstructing lesion, colonoscopy in 3-6 months.    1. If advanced adenoma, repeat in 1 year   2. If no advanced adenoma, repeat in 3 years, then every 5 years  E. PET scan not routinely recommended.   All questions were answered. The patient knows to call the clinic with any problems, questions or concerns. We can certainly see the patient much sooner if necessary.  Patient and plan discussed with Dr. Ancil Linsey and she is in agreement with the aforementioned.    KEFALAS,THOMAS 07/12/2015  3:40 PM

## 2015-07-12 NOTE — Assessment & Plan Note (Addendum)
Stage IIIB (T3, N2a, M0) adenocarcinoma of colon, S/P descending colon and rectal stump resection in September 2010 followed by chemotherapy consisting of FOLFOX.  Oncology history is up-to-date.  Colonoscopy was performed by Dr. Oneida Alar on 02/17/2015.  He will need a repeat in 3 years (May 2019).  I personally reviewed and went over radiographic studies with the patient.  The results are noted within this dictation.    Labs today as ordered: CBC diff, CMET, CEA.  I have added a magnesium level as well.  Rx for Augmentin x 10 days.  Repeat CT chest in 6 weeks to follow-up on imaging abnormalities following antibiotic treatment.  I have recommended he see if Dr. Karie Kirks performs skin lesion removals.  Both lesions appear benign.  If not, since they are bothering him, they should be removed and therefore, a referral to dermatology would be recommended.  Return in 2 months for follow-up.

## 2015-07-12 NOTE — Progress Notes (Signed)
See office visit encounter. 

## 2015-07-12 NOTE — Patient Instructions (Signed)
..  [place] at [facility] Discharge Instructions  RECOMMENDATIONS MADE BY THE CONSULTANT AND ANY TEST RESULTS WILL BE SENT TO YOUR REFERRING PHYSICIAN.  Robynn Pane PA-C reviewed chest xray with patient  We will repeat in 5-6 weeks and see you back after that [facility] flush and labs today  Thank you for choosing [place] at [facility] to provide your oncology and hematology care.  To afford each patient quality time with our provider, please arrive at least 15 minutes before your scheduled appointment time.    You need to re-schedule your appointment should you arrive 10 or more minutes late.  We strive to give you quality time with our providers, and arriving late affects you and other patients whose appointments are after yours.  Also, if you no show three or more times for appointments you may be dismissed from the clinic at the providers discretion.     Again, thank you for choosing [facility].  Our hope is that these requests will decrease the amount of time that you wait before being seen by our physicians.       _____________________________________________________________  Should you have questions after your visit to [facility], please contact our office at [phone] between the hours of 8:30 a.m. and 4:30 p.m.  Voicemails left after 4:30 p.m. will not be returned until the following business day.  For prescription refill requests, have your pharmacy contact our office.

## 2015-07-13 LAB — CEA: CEA: 3.1 ng/mL (ref 0.0–4.7)

## 2015-07-25 ENCOUNTER — Other Ambulatory Visit (HOSPITAL_COMMUNITY): Payer: Self-pay | Admitting: Oncology

## 2015-07-25 ENCOUNTER — Telehealth (HOSPITAL_COMMUNITY): Payer: Self-pay | Admitting: Emergency Medicine

## 2015-07-25 DIAGNOSIS — R059 Cough, unspecified: Secondary | ICD-10-CM

## 2015-07-25 DIAGNOSIS — R05 Cough: Secondary | ICD-10-CM

## 2015-07-25 MED ORDER — AZITHROMYCIN 250 MG PO TABS: ORAL_TABLET | ORAL | Status: DC

## 2015-07-25 NOTE — Telephone Encounter (Signed)
Notified pt that z-pack was called into the pharmacy.

## 2015-08-30 ENCOUNTER — Ambulatory Visit (HOSPITAL_COMMUNITY)
Admission: RE | Admit: 2015-08-30 | Discharge: 2015-08-30 | Disposition: A | Discharge status: Home / Self Care | Payer: Medicare Other | Source: Ambulatory Visit | Attending: Oncology | Admitting: Oncology

## 2015-08-30 ENCOUNTER — Other Ambulatory Visit (HOSPITAL_COMMUNITY): Payer: Self-pay | Admitting: Oncology

## 2015-08-30 DIAGNOSIS — I2510 Atherosclerotic heart disease of native coronary artery without angina pectoris: Secondary | ICD-10-CM | POA: Insufficient documentation

## 2015-08-30 DIAGNOSIS — Z85528 Personal history of other malignant neoplasm of kidney: Secondary | ICD-10-CM

## 2015-08-30 DIAGNOSIS — J219 Acute bronchiolitis, unspecified: Secondary | ICD-10-CM | POA: Diagnosis not present

## 2015-08-30 DIAGNOSIS — R0602 Shortness of breath: Secondary | ICD-10-CM | POA: Diagnosis not present

## 2015-08-30 DIAGNOSIS — Z8701 Personal history of pneumonia (recurrent): Secondary | ICD-10-CM | POA: Insufficient documentation

## 2015-08-30 DIAGNOSIS — I712 Thoracic aortic aneurysm, without rupture: Secondary | ICD-10-CM | POA: Insufficient documentation

## 2015-08-30 DIAGNOSIS — K8020 Calculus of gallbladder without cholecystitis without obstruction: Secondary | ICD-10-CM | POA: Insufficient documentation

## 2015-08-30 DIAGNOSIS — C189 Malignant neoplasm of colon, unspecified: Secondary | ICD-10-CM

## 2015-08-30 DIAGNOSIS — Z85038 Personal history of other malignant neoplasm of large intestine: Secondary | ICD-10-CM | POA: Diagnosis not present

## 2015-08-30 DIAGNOSIS — J189 Pneumonia, unspecified organism: Secondary | ICD-10-CM

## 2015-09-04 NOTE — Progress Notes (Signed)
[place], MD [place] [address]   Stage IIIB [phone]) adenocarcinoma of the colon, resection Sept 2010 adjuvant FOLFOX Renal cell carcinoma diagnosed in Sept 2010 s/p nephrectomy  CURRENT THERAPY: Surveillance per NCCN guidelines  INTERVAL HISTORY: [name] 69 y.o. male returns for  regular  visit for followup of  Stage IIIB (T3, N2a, M0) adenocarcinoma of colon, S/P descending colon and rectal stump resection in September 2010 followed by chemotherapy consisting of FOLFOX. 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    Adenocarcinoma of colon ([place])   06/26/2009 Initial Diagnosis Invasive adenocarcinoma   06/28/2009 Surgery Invasive adenocarcinoma with two lesions, both extending inot the pericolonic adipose tissue, maximum tumor size was 6.0 cm with 4/32 lymph nodes positive for disease. pT3, pN2, pMx   10/16/2009 - 03/20/2010 Chemotherapy FOLFOX x 12 cycles   He realistically has no major complaints. He is now 5 years out from both his colon and his renal cell cancers.  I reviewed his last colonoscopy from earlier this year and noted he is due again in 3 years.   He was treated at the end of October with Augmentin for cough and abnormal imaging of the chest, he is here today to review repeat imaging and for additional recommendations.  He notes no fever but still has a cough although it is improved. He has chronic SOB.   Past Medical History  Diagnosis Date  . Adenocarcinoma of colon ([place]) 2010    Stage IIIB (T3, N2a, M0), colectomy and chemotherapy with FOLFOX  . Type 2 diabetes mellitus ([place])   . Chronic atrial fibrillation ([place])   . Neuropathy ([place])   . Port catheter in place   . Thoracic aortic aneurysm ([place])   . CKD (chronic kidney disease) stage 2, GFR 60-89 ml/min   . Essential hypertension   . Mixed hyperlipidemia   . Renal cancer ([place]) 2010    Clear cell renal cancer status post nephrectomy       has Adenocarcinoma of colon ([place]); Port catheter in place; History of kidney cancer excluding renal pelvis; History of colonic polyps; and History of colon cancer on his problem list.     has No Known Allergies.  We administered heparin lock flush and sodium chloride.  Past Surgical History  Procedure Laterality Date  . Colectomy    . Nephrectomy    . Colonoscopy N/A 02/17/2015    Procedure: COLONOSCOPY;  Surgeon: Danie Binder, MD;  Location: [facility];  Service: Endoscopy;  Laterality: N/A;  1400 via colostomy - moved to 2:45 - office to notify    Denies any headaches, dizziness, double vision, fevers, chills, night sweats, nausea, vomiting, diarrhea, constipation, chest pain, heart palpitations, shortness of breath, blood in stool, black tarry stool, urinary pain, urinary burning, urinary frequency, hematuria. 14 point review of systems was performed and is negative except as detailed under history of present illness and above   PHYSICAL EXAMINATION  ECOG PERFORMANCE STATUS: 0 - Asymptomatic  Filed Vitals:   09/05/15 1345  BP: 118/81  Pulse: 84  Temp: 98.8 F (37.1 C)  Resp: 20    GENERAL:alert, no distress, well nourished, well developed, comfortable, cooperative, obese and smiling SKIN: skin color, texture, turgor are normal, no rashes or significant lesions HEAD: Normocephalic, No masses, lesions, tenderness or abnormalities EYES: normal, PERRLA, EOMI, Conjunctiva are pink and non-injected EARS: External ears normal OROPHARYNX:mucous membranes are moist  NECK:  supple, no adenopathy, trachea midline LYMPH:  no palpable lymphadenopathy, no hepatosplenomegaly BREAST:not examined LUNGS: clear to auscultation  HEART: regular rate & rhythm, no murmurs and no gallops ABDOMEN:abdomen soft, non-tender, obese, normal bowel sounds and colostomy producing stool appropriately BACK: Back symmetric, no curvature. EXTREMITIES:less then 2 second capillary refill, no joint  deformities, effusion, or inflammation, no skin discoloration, no cyanosis  NEURO: alert & oriented x 3 with fluent speech, no focal motor/sensory deficits, gait normal   LABORATORY DATA: I have reviewed the data as listed. CBC    Component Value Date/Time   WBC 9.9 07/12/2015 1520   RBC 3.69* 07/12/2015 1520   HGB 10.9* 07/12/2015 1520   HCT 34.2* 07/12/2015 1520   PLT 226 07/12/2015 1520   MCV 92.7 07/12/2015 1520   MCH 29.5 07/12/2015 1520   MCHC 31.9 07/12/2015 1520   RDW 14.4 07/12/2015 1520   LYMPHSABS 1.3 07/12/2015 1520   MONOABS 0.8 07/12/2015 1520   EOSABS 0.1 07/12/2015 1520   BASOSABS 0.0 07/12/2015 1520      Chemistry      Component Value Date/Time   NA 135 07/12/2015 1520   K 4.9 07/12/2015 1520   CL 104 07/12/2015 1520   CO2 26 07/12/2015 1520   BUN 17 07/12/2015 1520   CREATININE 1.45* 07/12/2015 1520      Component Value Date/Time   CALCIUM 9.0 07/12/2015 1520   ALKPHOS 49 07/12/2015 1520   AST 17 07/12/2015 1520   ALT 20 07/12/2015 1520   BILITOT 0.7 07/12/2015 1520     Lab Results  Component Value Date   CEA 3.1 07/12/2015    Lab Results  Component Value Date   HGBA1C 6.0* 11/26/2013    RADIOGRAPHIC STUDIES: I have personally reviewed the radiological images as listed and agreed with the findings in the report.  CLINICAL DATA: History of kidney cancer, colon cancer, recent pneumonia. Shortness of breath.  EXAM: CT CHEST WITHOUT CONTRAST  TECHNIQUE: Multidetector CT imaging of the chest was performed following the standard protocol without IV contrast.  COMPARISON: 07/03/2015 and baseline CT chest 08/07/2009.  FINDINGS: Mediastinum/Nodes: Mediastinal lymph nodes are not enlarged by CT size criteria. Hilar regions are difficult to definitively evaluate without IV contrast. No axillary adenopathy. Ascending aorta measures 4.8 cm. Heart size within normal limits. Coronary artery calcification. No pericardial  effusion.  Lungs/Pleura: Interval improvement in multilobar ill-defined peribronchovascular nodularity and ground-glass, without complete resolution. Residual nodularity and ground-glass are seen in the left lung. No pleural fluid. Airway is unremarkable.  Upper abdomen: Visualized portion of the liver is unremarkable. Stones are seen in the gallbladder. Adrenal glands are unremarkable. Right nephrectomy. Visualized portions of the left kidney, spleen, pancreas, stomach and bowel are grossly unremarkable. No upper abdominal adenopathy.  Musculoskeletal: No worrisome lytic or sclerotic lesions.  IMPRESSION: 1. Interval improvement in multilobar infectious bronchiolitis, without complete resolution. 2. Ascending aortic aneurysm, stable from 08/07/2009. Ascending thoracic aortic aneurysm. Recommend semi-annual imaging followup by CTA or MRA and referral to cardiothoracic surgery if not already obtained. This recommendation follows 2010 ACCF/AHA/AATS/ACR/ASA/SCA/SCAI/SIR/STS/SVM Guidelines for the Diagnosis and Management of Patients With Thoracic Aortic Disease. Circulation. 2010; 121: [id number]. 3. Coronary artery calcification. 4. Cholelithiasis.   Electronically Signed  By: Lorin Picket M.D.  On: 08/30/2015 15:32  ASSESSMENT:  1. Stage IIIB (T3, N2a, M0) adenocarcinoma of colon, S/P descending colon and rectal stump resection in September 2010 followed by chemotherapy consisting of FOLFOX. 2. 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 3. Chronic renal disease, G3a with GFR of 53 on 11/26/2013 4. Obesity 5. DM 6. COPD, S/P 2 ppd x 34 years 7. Neurodermatitis  8. Atrial fibrillation on Coumadin managed by Dr. Karie Kirks 9. Cholelithiases on [place] 10. Anemia most likely secondary to CKD  Patient Active Problem List   Diagnosis Date Noted  . History of colon cancer   . History of colonic polyps 02/01/2015  . History of kidney cancer  excluding renal pelvis 06/19/2013  . Port catheter in place 11/16/2012  . Adenocarcinoma of colon ([place]) 05/31/2011     PLAN:  1. I personally reviewed and went over laboratory results with the patient.  The results are noted within this dictation. 2. I personally reviewed and went over radiographic studies with the patient.  The results are noted within this dictation.   THERAPY PLAN:   We again reviewed the NCCN guidelines in regards to his Colon Cancer and his renal cell carcinoma.His last colonoscopy was in 01/2015. I reemphasized the importance of ongoing surveillance with colonoscopy. In regards to additional imaging studies for his colon cancer I advised him at this point we would consider him cured and per the national guidelines there is no need for ongoing imaging studies.  In regards to his renal cell carcinoma he has reached 5 years, the need for additional imaging at this point is unclear. We will continue to address on a yearly basis.  I have referred him to Dr. Luan Pulling for his ongoing pulmonary findings on CT and symptoms. I emphasized that I feel it is very important that he follow-up with him.   He will return to see [place] in 6 months. We can consider moving his visits out to annually in the future.     All questions were answered. The patient knows to call the clinic with any problems, questions or concerns. We can certainly see the patient much sooner if necessary.   This note was signed electronically  Molli Hazard, MD  10/22/2015

## 2015-09-05 ENCOUNTER — Encounter (HOSPITAL_COMMUNITY): Payer: Self-pay | Admitting: Hematology & Oncology

## 2015-09-05 ENCOUNTER — Other Ambulatory Visit (HOSPITAL_COMMUNITY): Payer: Self-pay | Admitting: Oncology

## 2015-09-05 ENCOUNTER — Encounter (HOSPITAL_COMMUNITY): Payer: Self-pay | Admitting: Lab

## 2015-09-05 ENCOUNTER — Encounter (HOSPITAL_COMMUNITY): Payer: Medicare Other | Attending: Hematology and Oncology

## 2015-09-05 ENCOUNTER — Encounter (HOSPITAL_BASED_OUTPATIENT_CLINIC_OR_DEPARTMENT_OTHER): Payer: Medicare Other | Admitting: Hematology & Oncology

## 2015-09-05 VITALS — BP 118/81 | HR 84 | Temp 98.8°F | Resp 20 | Wt 294.2 lb

## 2015-09-05 DIAGNOSIS — Z9889 Other specified postprocedural states: Secondary | ICD-10-CM | POA: Insufficient documentation

## 2015-09-05 DIAGNOSIS — Z85038 Personal history of other malignant neoplasm of large intestine: Secondary | ICD-10-CM | POA: Diagnosis present

## 2015-09-05 DIAGNOSIS — Z85528 Personal history of other malignant neoplasm of kidney: Secondary | ICD-10-CM | POA: Diagnosis not present

## 2015-09-05 DIAGNOSIS — R0602 Shortness of breath: Secondary | ICD-10-CM | POA: Diagnosis not present

## 2015-09-05 DIAGNOSIS — Z95828 Presence of other vascular implants and grafts: Secondary | ICD-10-CM

## 2015-09-05 DIAGNOSIS — Z452 Encounter for adjustment and management of vascular access device: Secondary | ICD-10-CM | POA: Diagnosis not present

## 2015-09-05 MED ORDER — HEPARIN SOD (PORK) LOCK FLUSH 100 UNIT/ML IV SOLN
500.0000 [IU] | Freq: Once | INTRAVENOUS | Status: AC
  Administered 2015-09-05: 500 [IU] via INTRAVENOUS

## 2015-09-05 MED ORDER — SODIUM CHLORIDE 0.9 % IJ SOLN
10.0000 mL | INTRAMUSCULAR | Status: DC | PRN
  Administered 2015-09-05: 10 mL via INTRAVENOUS
  Filled 2015-09-05: qty 10

## 2015-09-05 NOTE — Progress Notes (Signed)
Referral sent to Dr Luan Pulling.  Records faxed on 12/6.  They will contact patient.

## 2015-09-05 NOTE — Patient Instructions (Addendum)
[place] at [facility] Discharge Instructions  RECOMMENDATIONS MADE BY THE CONSULTANT AND ANY TEST RESULTS WILL BE SENT TO YOUR REFERRING PHYSICIAN.   Exam completed by Dr Whitney Muse today Port flush today Port flush every 8 weeks Referral to Dr Luan Pulling, pulmonologist, they will call you with an appt Referral to to cardiothoracic surgeon, they will call you for an appt Return to see the doctor in 6 months with labs (going to check your PSA)  Please call the clinic if you have any questions or concerns   Thank you for choosing [place] at [facility] to provide your oncology and hematology care.  To afford each patient quality time with our provider, please arrive at least 15 minutes before your scheduled appointment time.    You need to re-schedule your appointment should you arrive 10 or more minutes late.  We strive to give you quality time with our providers, and arriving late affects you and other patients whose appointments are after yours.  Also, if you no show three or more times for appointments you may be dismissed from the clinic at the providers discretion.     Again, thank you for choosing [facility].  Our hope is that these requests will decrease the amount of time that you wait before being seen by our physicians.       _____________________________________________________________  Should you have questions after your visit to [facility], please contact our office at [phone] between the hours of 8:30 a.m. and 4:30 p.m.  Voicemails left after 4:30 p.m. will not be returned until the following business day.  For prescription refill requests, have your pharmacy contact our office.

## 2015-09-05 NOTE — Progress Notes (Signed)
[name] presented for Portacath access and flush. Portacath located left chest wall accessed with  H 20 needle. Good blood return present. Portacath flushed with 21ml NS and 500U/30ml Heparin and needle removed intact. Procedure without incident. Patient tolerated procedure well.

## 2015-09-05 NOTE — Progress Notes (Signed)
See office visit encounter. 

## 2015-09-12 ENCOUNTER — Encounter: Payer: Medicare Other | Admitting: Surgery

## 2015-10-19 ENCOUNTER — Other Ambulatory Visit (HOSPITAL_COMMUNITY): Payer: Self-pay | Admitting: Pulmonary Disease

## 2015-10-19 DIAGNOSIS — R911 Solitary pulmonary nodule: Secondary | ICD-10-CM

## 2015-10-23 ENCOUNTER — Ambulatory Visit (HOSPITAL_COMMUNITY)
Admission: RE | Admit: 2015-10-23 | Discharge: 2015-10-23 | Disposition: A | Discharge status: Home / Self Care | Payer: Medicare Other | Source: Ambulatory Visit | Attending: Pulmonary Disease | Admitting: Pulmonary Disease

## 2015-10-23 DIAGNOSIS — I10 Essential (primary) hypertension: Secondary | ICD-10-CM | POA: Diagnosis not present

## 2015-10-23 DIAGNOSIS — R918 Other nonspecific abnormal finding of lung field: Secondary | ICD-10-CM | POA: Insufficient documentation

## 2015-10-23 DIAGNOSIS — Z85038 Personal history of other malignant neoplasm of large intestine: Secondary | ICD-10-CM | POA: Diagnosis not present

## 2015-10-23 DIAGNOSIS — R911 Solitary pulmonary nodule: Secondary | ICD-10-CM | POA: Insufficient documentation

## 2015-10-23 DIAGNOSIS — I712 Thoracic aortic aneurysm, without rupture: Secondary | ICD-10-CM | POA: Insufficient documentation

## 2015-10-23 DIAGNOSIS — E119 Type 2 diabetes mellitus without complications: Secondary | ICD-10-CM | POA: Insufficient documentation

## 2015-10-23 DIAGNOSIS — Z85528 Personal history of other malignant neoplasm of kidney: Secondary | ICD-10-CM | POA: Diagnosis not present

## 2015-10-23 DIAGNOSIS — I700 Atherosclerosis of aorta: Secondary | ICD-10-CM | POA: Diagnosis not present

## 2015-10-23 DIAGNOSIS — K8020 Calculus of gallbladder without cholecystitis without obstruction: Secondary | ICD-10-CM | POA: Insufficient documentation

## 2015-10-23 DIAGNOSIS — I2510 Atherosclerotic heart disease of native coronary artery without angina pectoris: Secondary | ICD-10-CM | POA: Insufficient documentation

## 2015-10-31 ENCOUNTER — Encounter (HOSPITAL_COMMUNITY): Payer: Medicare Other | Attending: Hematology and Oncology

## 2015-11-02 ENCOUNTER — Telehealth: Payer: Self-pay

## 2015-11-02 NOTE — Telephone Encounter (Signed)
Received a phone call from [place] with Dr Sinda Du' office.  Per Denman George, Dr Luan Pulling had contacted Dr Titus Mould about doing a bronch on patient (1.23.17 CT shows ground-glass opacities w/ a history of renal and colon cancer) - per Denman George, Dr Titus Mould informed Dr Luan Pulling that our office would contact patient to schedule procedure.  Denman George calling because pt had not heard from our office.  Advised Denman George will keep her up to date.  Pt has never been seen in this office before and there is no documentation by Dr Idamae Lusher with [facility] Snook to see if there was anything up front.  Daleen Snook stated that pt had called here twice yesterday asking about a biopsy but with no documentation in pt's chart, he was instructed to call Dr Luan Pulling' office.  DF was paged at 11:23am - no response Per office protocol, pt needs to be seen in the office before a procedure is scheduled.  Called spoke with patient to schedule consult with Dr DeDios this afternoon (appt was for 2:30pm) but pt declined this appt d/t time it takes him to travel to [place].  Pt asked for appt tomorrow or early next week.  No openings for tomorrow - advised pt will check again and call him back.  Okay to leave detailed message per pt.  Able to add pt to Dr DeDios' schedule for tomorrow - limited openings.  Left detailed message on pt's machine asking him to call me back ASAP so we know what time is convenient for his consult.  Will route back to my inbox for follow up.

## 2015-11-03 NOTE — Telephone Encounter (Signed)
LMOM TCB x2 for pt - Dr DeDios still has openings for today but need to know what time would work best for pt  ATC [place] w/ Dr Luan Pulling, line rang multiple times with no answer before automated message came on stating that 'the party you are trying to reach is not available; please try your call again later.'

## 2015-11-03 NOTE — Telephone Encounter (Signed)
ATC [name] again to update her on the situation, got same response Called the main office number @ [phone] and automated message states that the office closes at 12pm on Fridays.

## 2015-11-06 NOTE — Telephone Encounter (Signed)
Call after 10 am    [phone]

## 2015-11-06 NOTE — Telephone Encounter (Signed)
Called and spoke w/ pt. He is scheduled to see Dr. Corrie Dandy on 11/07/15 at 2:30. Pt is aware of our location and we are on 2nd floor. Pt is going to bring list of meds. Will call Dr. Luan Pulling office in AM.

## 2015-11-07 ENCOUNTER — Encounter: Payer: Self-pay | Admitting: Pulmonary Disease

## 2015-11-07 ENCOUNTER — Ambulatory Visit (INDEPENDENT_AMBULATORY_CARE_PROVIDER_SITE_OTHER): Payer: Medicare Other | Admitting: Pulmonary Disease

## 2015-11-07 VITALS — BP 142/78 | HR 78 | Ht 73.0 in | Wt 290.4 lb

## 2015-11-07 DIAGNOSIS — J189 Pneumonia, unspecified organism: Secondary | ICD-10-CM

## 2015-11-07 DIAGNOSIS — R938 Abnormal findings on diagnostic imaging of other specified body structures: Secondary | ICD-10-CM

## 2015-11-07 DIAGNOSIS — R05 Cough: Secondary | ICD-10-CM | POA: Insufficient documentation

## 2015-11-07 DIAGNOSIS — K219 Gastro-esophageal reflux disease without esophagitis: Secondary | ICD-10-CM | POA: Insufficient documentation

## 2015-11-07 DIAGNOSIS — G4710 Hypersomnia, unspecified: Secondary | ICD-10-CM | POA: Insufficient documentation

## 2015-11-07 DIAGNOSIS — R9389 Abnormal findings on diagnostic imaging of other specified body structures: Secondary | ICD-10-CM | POA: Insufficient documentation

## 2015-11-07 DIAGNOSIS — R058 Other specified cough: Secondary | ICD-10-CM | POA: Insufficient documentation

## 2015-11-07 MED ORDER — FLUTICASONE PROPIONATE 50 MCG/ACT NA SUSP: 2.0000 | Freq: Every day | NASAL | Status: DC

## 2015-11-07 MED ORDER — RANITIDINE HCL 150 MG PO TABS: 150.0000 mg | ORAL_TABLET | Freq: Two times a day (BID) | ORAL | Status: DC

## 2015-11-07 NOTE — Assessment & Plan Note (Addendum)
Pt with some hypersomnia, snoring, apnea. Denies choking. Has obesity. Airway class III. Will observe for now. May need noc ox. Told him to have his sleep observed.

## 2015-11-07 NOTE — Assessment & Plan Note (Signed)
Pt with uacs and PND. Start flonase 2 sq/nostril at hs.

## 2015-11-07 NOTE — Patient Instructions (Signed)
   We will treat your sinus issues.  Start Flonase 2 squirts/nostril at bedtime.  We will treat yor heartburn. Start Zantac 150 mg 2x/day. Diet changes. Wait 2 hrs after eating before you lie down.   Plan to rpt chest sct scan in 6 weeks.   We will try this plan and see if your chest ct scan improves.  Please call the office if your cough worsens while on treatment.    Return to clinic in 6-7 weeks.   Elsie Saas A. Corrie Dandy, MD Pulmonary [facility] [place] [phone], Fax: [phone]

## 2015-11-07 NOTE — Progress Notes (Signed)
Subjective:    Patient ID: [name], male    DOB: April 19, 1946, 70 y.o.   MRN: [id number]  HPI    This is the case of [name], 70 y.o. Male, who was referred by Dr. Sinda Du  in consultation regarding his abnormal chest ct scan.   As you very well know, patient has a 40 PY smoking history, quit in 2010.  Not known to have asthma, COPD, allergies.  Pt was dxed with colon CA in Oct 2010., as well as R kidney CA. Had colectomy, R nephrectomy, and colostomy then.   He sees Dr. Ancil Linsey for Heme-Onc 2x/year.  He gets a yearly pan ct scan.   Patient had chest CT scan in 07/2015 which showed B infiltrates, L>R, ground glass like and nodular infiltrates. CT scan in Oct 2016 was done as part of surveillance. He ended up being on 1 month with Abx. He denied having sx then but was given abx for 1 month.    He had a rpt chest CT scan in 08/2015 which showed significant improvement of infiltrates. He was referred to Dr. Luan Pulling (pulmonologist) at [facility] in Dec 2016. He was give a 3week course of Abx again.  He had a rpt chest ct scan in 10/23/15 which showed patchy GGlike infiltrates, more at base.  He was advised bronchoscopy so he was referred to this clinic.   Pt is chronically SOB. Gets SOB with more than ADLs. Has been SOB x 2 yrs.  Denies fevers, chills. On and off cough.  Lost 15 lbs  intentional.  No recent sick contacts.  Has GERD -- controlled.   Pt has snoring. Has witnessed apneas. Denies gasping or choking.  Sleeps 7 hrs/night.  Has refreshed sleep.  Has issues falling asleep at night.  Has naps in pm sometimes.     Review of Systems  Constitutional: Negative.  Negative for fever and unexpected weight change.  HENT: Positive for congestion and postnasal drip. Negative for dental problem, ear pain, nosebleeds, rhinorrhea, sinus pressure, sneezing, sore throat and trouble swallowing.   Eyes: Negative.  Negative for redness and itching.    Respiratory: Positive for cough and shortness of breath. Negative for chest tightness and wheezing.   Cardiovascular: Negative.  Negative for chest pain, palpitations and leg swelling.  Gastrointestinal: Negative for nausea and vomiting.  Endocrine: Negative.   Genitourinary: Negative for dysuria.  Musculoskeletal: Negative.  Negative for joint swelling.  Skin: Negative.  Negative for rash.  Allergic/Immunologic: Negative.   Neurological: Negative.  Negative for headaches.  Hematological: Negative.  Does not bruise/bleed easily.  Psychiatric/Behavioral: Negative.  Negative for dysphoric mood. The patient is not nervous/anxious.   All other systems reviewed and are negative.  Past Medical History  Diagnosis Date  . Adenocarcinoma of colon ([place]) 2010    Stage IIIB (T3, N2a, M0), colectomy and chemotherapy with FOLFOX  . Type 2 diabetes mellitus ([place])   . Chronic atrial fibrillation ([place])   . Neuropathy ([place])   . Port catheter in place   . Thoracic aortic aneurysm ([place])   . CKD (chronic kidney disease) stage 2, GFR 60-89 ml/min   . Essential hypertension   . Mixed hyperlipidemia   . 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 Breast cancer Mother   . Colon cancer Brother   .  Colon cancer Maternal Grandfather      Past Surgical History  Procedure Laterality Date  . Colectomy    . Nephrectomy    . Colonoscopy N/A 02/17/2015    Procedure: COLONOSCOPY;  Surgeon: Danie Binder, MD;  Location: [facility];  Service: Endoscopy;  Laterality: N/A;  1400 via colostomy - moved to 2:45 - office to notify    Social History   Social History  . Marital Status: Married    Spouse Name: N/A  . Number of Children: N/A  . Years of Education: N/A   Occupational History  . Not on file.   Social History Main Topics  . Smoking status: Former Smoker    Types:  Cigarettes  . Smokeless tobacco: Never Used  . Alcohol Use: 0.0 oz/week    0 Standard drinks or equivalent per week     Comment: Occasional gin and tonic  . Drug Use: No  . Sexual Activity: Not on file   Other Topics Concern  . 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 Drinks gin and tonic/baileys seldom. Non smoker.   No Known Allergies   Outpatient Prescriptions Prior to Visit  Medication Sig Dispense Refill  . diltiazem (CARDIZEM) 60 MG tablet Take 60 mg by mouth daily.     . ferrous sulfate 325 (65 FE) MG tablet Take 325 mg by mouth daily with breakfast.    . HYDROcodone-acetaminophen (VICODIN) 5-500 MG per tablet Take 2 tablets by mouth every 4 (four) hours as needed for pain.     . metFORMIN (GLUCOPHAGE) 1000 MG tablet Take 1,000 mg by mouth 2 (two) times daily with a meal.      . Multiple Vitamins-Minerals (COMPLETE MULTIVITAMIN/MINERAL PO) Take 1 tablet by mouth daily.    . pioglitazone (ACTOS) 45 MG tablet Take 45 mg by mouth daily.      . simvastatin (ZOCOR) 80 MG tablet Take 80 mg by mouth daily. Takes 1/2 tablet every other day    . vitamin B-12 (CYANOCOBALAMIN) 1000 MCG tablet Take 2,000 mcg by mouth daily.    . Vitamins-Lipotropics (EAR HEALTH PLUS PO) Take 1 tablet by mouth daily.    Marland Kitchen warfarin (COUMADIN) 5 MG tablet Take 5 mg by mouth daily. 5 mg daily excet 10 mg on 2 days a week    . gabapentin (NEURONTIN) 100 MG capsule Take by mouth 3 (three) times daily. 200 mg in the am, 300 at lunch time and 200 mg at HS    . amoxicillin-clavulanate (AUGMENTIN) 875-125 MG tablet Take 1 tablet by mouth 2 (two) times daily. (Patient not taking: Reported on 09/05/2015) 20 tablet 0  . azithromycin (ZITHROMAX Z-PAK) 250 MG tablet Take as directed (Patient not taking: Reported on 09/05/2015) 6 each 0   Facility-Administered Medications Prior to Visit  Medication Dose Route Frequency Provider Last Rate Last Dose  . sodium chloride 0.9 % injection 10 mL  10  mL Intravenous PRN Patrici Ranks, MD   10 mL at 02/21/15 1415   Meds ordered this encounter  Medications  . ranitidine (ZANTAC) 150 MG tablet    Sig: Take 1 tablet (150 mg total) by mouth 2 (two) times daily.    Dispense:  60 tablet    Refill:  5  . fluticasone (FLONASE) 50 MCG/ACT nasal spray    Sig: Place 2 sprays into both nostrils daily.    Dispense:  16 g    Refill:  5  Objective:   Physical Exam  Vitals:  Filed Vitals:   11/07/15 1442  BP: 142/78  Pulse: 78  Height: 6\' 1"  (1.854 m)  Weight: 290 lb 6.4 oz (131.725 kg)  SpO2: 95%    Constitutional/General:  Pleasant, well-nourished, well-developed, not in any distress,  Comfortably seating.  Well kempt  Body mass index is 38.32 kg/(m^2).  HEENT: Pupils equal and reactive to light and accommodation. Anicteric sclerae. Normal nasal mucosa.   No oral  lesions,  mouth clear,  oropharynx clear, no postnasal drip. (-) Oral thrush. No dental caries.  Airway - Mallampati class III  Neck: No masses. Midline trachea. No JVD, (-) LAD. (-) bruits appreciated.  Respiratory/Chest: Grossly normal chest. (-) deformity. (-) Accessory muscle use.  Symmetric expansion. (-) Tenderness on palpation.  Resonant on percussion.  Diminished BS on both lower lung zones. (-) wheezing, rhonchi Some crackles at bases (-) egophony  Cardiovascular: Regular rate and  rhythm, heart sounds normal, no murmur or gallops, no peripheral edema  Gastrointestinal:  Normal bowel sounds. Soft, non-tender. No hepatosplenomegaly.  (-) masses.   Musculoskeletal:  Normal muscle tone. Normal gait.   Extremities: Grossly normal. (-) clubbing, cyanosis.  (-) edema  Skin: (-) rash,lesions seen.   Neurological/Psychiatric : alert, oriented to time, place, person. Normal mood and affect     Vitals:  Filed Vitals:   11/07/15 1442  BP: 142/78  Pulse: 78  Height: 6\' 1"  (1.854 m)  Weight: 290 lb 6.4 oz (131.725 kg)  SpO2: 95%         Assessment & Plan:  Pneumonia Pt has a 50 PY smoking history, quit in 2010. Denies any asthma, copd, allergies.  Pt with history of Colonic CA and R renal CA in 2010. Had colectomy, nephrectomy, colostomy in 2010. Also, had Ctx. Has been in remission.  Sees Dr. Whitney Muse. Pt gets annual Chest ct scan for CA surveillance.  Patient had chest CT scan in 07/2015 which showed B infiltrates, L>R, ground glass like and nodular infiltrates. CT scan in Oct 2016 was done as part of surveillance. He ended up being on 1 month with Abx. He denied having sx then but was given abx for 1 month. He had a rpt chest CT scan in 08/2015 which showed significant improvement of infiltrates. He was referred to Dr. Luan Pulling (pulmonologist) at [facility] in Dec 2016. He was give a 3week course of Abx again.  He had a rpt chest ct scan in 10/23/15 which showed patchy GGlike infiltrates, more at base.  He was advised bronchoscopy so he was referred to this clinic.   I d/w pt the chest ct scan findings. I personally reviewed all CT scan images and reports on this pt. (Oct 2016, Nov 2016, Jan 2017) The Jan 2017 chest ct scan is mildly worse than 08/2015 ct scan.  The worst is oct 2016 ct scan.   Differentials for B GG like infiltrates/reticular  infiltrates: 1. Possible asp pna/pneumonitis. Pt has GERD but is controlled. 2. Related to UACS. Pt has sinus congestion and repeated PND. 3. Possible infxn -- no signs of infxn now.  4. Inflammation. 5. Less likely CA 6. Possible ILD -- less likely at this point.   Plan: 1. D/w him re: bronchoscopy and need for BAL and bx.  Yield might be low as infiltrates are not as prominent. May need open lung bx.  D/w pt re: risks and possible cx. Pt is on coumadin, has afib, has DM. Risks for Cx are high  with c0-morbidities. Alternatively, we can be conservative and try to Rx UACS with flonase and GERD with zantac and rpt chest ct scan in 6 weeks.  He agrees with conservative Rx.  2.  Plan to rpr chest ct scan in 6 weeks.  3. If worse ct scan findings, will need bronch, BAL, Bx.  4. Pt to call if worse.   Abnormal CT scan, chest Pt with GGlike infiltrates, patchy. As discussed above. Plan to rpt chest ct scan in 6 weeks. Possible early ILD, less likely.   GERD (gastroesophageal reflux disease) Pt with GERD. Will rx with zantac.  Diet changes.   Upper airway cough syndrome Pt with uacs and PND. Start flonase 2 sq/nostril at hs.   Hypersomnia Pt with some hypersomnia, snoring, apnea. Denies choking. Has obesity. Airway class III. Will observe for now. May need noc ox. Told him to have his sleep observed.

## 2015-11-07 NOTE — Assessment & Plan Note (Addendum)
Pt has a 13 PY smoking history, quit in 2010. Denies any asthma, copd, allergies.  Pt with history of Colonic CA and R renal CA in 2010. Had colectomy, nephrectomy, colostomy in 2010. Also, had Ctx. Has been in remission.  Sees Dr. Whitney Muse. Pt gets annual Chest ct scan for CA surveillance.  Patient had chest CT scan in 07/2015 which showed B infiltrates, L>R, ground glass like and nodular infiltrates. CT scan in Oct 2016 was done as part of surveillance. He ended up being on 1 month with Abx. He denied having sx then but was given abx for 1 month. He had a rpt chest CT scan in 08/2015 which showed significant improvement of infiltrates. He was referred to Dr. Luan Pulling (pulmonologist) at [facility] in Dec 2016. He was give a 3week course of Abx again.  He had a rpt chest ct scan in 10/23/15 which showed patchy GGlike infiltrates, more at base.  He was advised bronchoscopy so he was referred to this clinic.   I d/w pt the chest ct scan findings. I personally reviewed all CT scan images and reports on this pt. (Oct 2016, Nov 2016, Jan 2017) The Jan 2017 chest ct scan is mildly worse than 08/2015 ct scan.  The worst is oct 2016 ct scan.   Differentials for B GG like infiltrates/reticular  infiltrates: 1. Possible asp pna/pneumonitis. Pt has GERD but is controlled. 2. Related to UACS. Pt has sinus congestion and repeated PND. 3. Possible infxn -- no signs of infxn now.  4. Inflammation. 5. Less likely CA 6. Possible ILD -- less likely at this point.   Plan: 1. D/w him re: bronchoscopy and need for BAL and bx.  Yield might be low as infiltrates are not as prominent. May need open lung bx.  D/w pt re: risks and possible cx. Pt is on coumadin, has afib, has DM. Risks for Cx are high with c0-morbidities. Alternatively, we can be conservative and try to Rx UACS with flonase and GERD with zantac and rpt chest ct scan in 6 weeks.  He agrees with conservative Rx.  2. Plan to rpr chest ct scan in 6 weeks.  3. If  worse ct scan findings, will need bronch, BAL, Bx.  4. Pt to call if worse.

## 2015-11-07 NOTE — Assessment & Plan Note (Signed)
Pt with GGlike infiltrates, patchy. As discussed above. Plan to rpt chest ct scan in 6 weeks. Possible early ILD, less likely.

## 2015-11-07 NOTE — Assessment & Plan Note (Signed)
Pt with GERD. Will rx with zantac.  Diet changes.

## 2015-11-07 NOTE — Telephone Encounter (Signed)
Called spoke with [facility] and updated her on appt status.  She is going to fax records for today's appt.  Nothing further needed; will sign off.

## 2015-11-08 ENCOUNTER — Encounter (HOSPITAL_COMMUNITY): Payer: Medicare Other | Attending: Hematology and Oncology

## 2015-11-08 VITALS — BP 135/82 | HR 51 | Temp 99.0°F | Resp 20

## 2015-11-08 DIAGNOSIS — Z85038 Personal history of other malignant neoplasm of large intestine: Secondary | ICD-10-CM | POA: Diagnosis not present

## 2015-11-08 DIAGNOSIS — Z95828 Presence of other vascular implants and grafts: Secondary | ICD-10-CM | POA: Insufficient documentation

## 2015-11-08 DIAGNOSIS — Z452 Encounter for adjustment and management of vascular access device: Secondary | ICD-10-CM | POA: Diagnosis present

## 2015-11-08 DIAGNOSIS — Z85528 Personal history of other malignant neoplasm of kidney: Secondary | ICD-10-CM | POA: Diagnosis not present

## 2015-11-08 MED ORDER — HEPARIN SOD (PORK) LOCK FLUSH 100 UNIT/ML IV SOLN
500.0000 [IU] | Freq: Once | INTRAVENOUS | Status: AC
  Administered 2015-11-08: 500 [IU] via INTRAVENOUS

## 2015-11-08 MED ORDER — SODIUM CHLORIDE 0.9% FLUSH
20.0000 mL | INTRAVENOUS | Status: DC | PRN
  Administered 2015-11-08: 20 mL via INTRAVENOUS
  Filled 2015-11-08: qty 20

## 2015-11-08 MED ORDER — HEPARIN SOD (PORK) LOCK FLUSH 100 UNIT/ML IV SOLN
INTRAVENOUS | Status: AC
  Filled 2015-11-08: qty 5

## 2015-11-08 NOTE — Patient Instructions (Signed)
[facility] at [facility] Discharge Instructions  RECOMMENDATIONS MADE BY THE CONSULTANT AND ANY TEST RESULTS WILL BE SENT TO YOUR REFERRING PHYSICIAN.  Port flush today. Please return as scheduled.    Thank you for choosing [facility] at [facility] to provide your oncology and hematology care.  To afford each patient quality time with our provider, please arrive at least 15 minutes before your scheduled appointment time.   Beginning January 23rd 2017 lab work for the Cancer Center will be done in the  Main lab at [facility] on 1st floor. If you have a lab appointment with the Cancer Center please come in thru the  Main Entrance and check in at the main information desk  You need to re-schedule your appointment should you arrive 10 or more minutes late.  We strive to give you quality time with our providers, and arriving late affects you and other patients whose appointments are after yours.  Also, if you no show three or more times for appointments you may be dismissed from the clinic at the providers discretion.     Again, thank you for choosing [facility].  Our hope is that these requests will decrease the amount of time that you wait before being seen by our physicians.       _____________________________________________________________  Should you have questions after your visit to  Cancer Center, please contact our office at [phone] between the hours of 8:30 a.m. and 4:30 p.m.  Voicemails left after 4:30 p.m. will not be returned until the following business day.  For prescription refill requests, have your pharmacy contact our office.     

## 2015-11-08 NOTE — Progress Notes (Signed)
Joven H Skow presented for Portacath access and flush. Proper placement of portacath confirmed by CXR. Portacath located left chest wall accessed with  H 20 needle. Good blood return present. Portacath flushed with 20ml NS and 500U/5ml Heparin and needle removed intact. Procedure without incident. Patient tolerated procedure well.   

## 2015-12-19 ENCOUNTER — Ambulatory Visit (HOSPITAL_COMMUNITY)
Admission: RE | Admit: 2015-12-19 | Discharge: 2015-12-19 | Disposition: A | Discharge status: Home / Self Care | Payer: Medicare Other | Source: Ambulatory Visit | Attending: Pulmonary Disease | Admitting: Pulmonary Disease

## 2015-12-19 ENCOUNTER — Other Ambulatory Visit: Payer: Self-pay | Admitting: Pulmonary Disease

## 2015-12-19 DIAGNOSIS — K8020 Calculus of gallbladder without cholecystitis without obstruction: Secondary | ICD-10-CM | POA: Insufficient documentation

## 2015-12-19 DIAGNOSIS — J189 Pneumonia, unspecified organism: Secondary | ICD-10-CM

## 2015-12-19 DIAGNOSIS — I708 Atherosclerosis of other arteries: Secondary | ICD-10-CM | POA: Insufficient documentation

## 2015-12-21 NOTE — Progress Notes (Signed)
Quick Note:  Called and spoke with pt. Reviewed results and recs. Pt voiced understanding and had no further questions. ______ 

## 2015-12-26 ENCOUNTER — Encounter (HOSPITAL_COMMUNITY): Payer: Medicare Other | Attending: Hematology and Oncology

## 2015-12-26 DIAGNOSIS — Z95828 Presence of other vascular implants and grafts: Secondary | ICD-10-CM | POA: Insufficient documentation

## 2015-12-26 DIAGNOSIS — Z452 Encounter for adjustment and management of vascular access device: Secondary | ICD-10-CM

## 2015-12-26 DIAGNOSIS — C189 Malignant neoplasm of colon, unspecified: Secondary | ICD-10-CM

## 2015-12-26 DIAGNOSIS — Z85528 Personal history of other malignant neoplasm of kidney: Secondary | ICD-10-CM | POA: Diagnosis not present

## 2015-12-26 DIAGNOSIS — Z85038 Personal history of other malignant neoplasm of large intestine: Secondary | ICD-10-CM | POA: Diagnosis not present

## 2015-12-26 MED ORDER — HEPARIN SOD (PORK) LOCK FLUSH 100 UNIT/ML IV SOLN
500.0000 [IU] | Freq: Once | INTRAVENOUS | Status: AC
  Administered 2015-12-26: 500 [IU] via INTRAVENOUS

## 2015-12-26 MED ORDER — SODIUM CHLORIDE 0.9% FLUSH
20.0000 mL | INTRAVENOUS | Status: DC | PRN
  Administered 2015-12-26: 20 mL via INTRAVENOUS
  Filled 2015-12-26: qty 20

## 2015-12-26 NOTE — Progress Notes (Signed)
[name] presented for Portacath access and flush. Proper placement of portacath confirmed by CXR. Portacath located left chest wall accessed with  H 20 needle. Good blood return present. Portacath flushed with 20ml NS and 500U/5ml Heparin and needle removed intact. Procedure without incident. Patient tolerated procedure well.   

## 2015-12-26 NOTE — Patient Instructions (Signed)
[facility] at [facility] Discharge Instructions  RECOMMENDATIONS MADE BY THE CONSULTANT AND ANY TEST RESULTS WILL BE SENT TO YOUR REFERRING PHYSICIAN.  Port flush today.    Thank you for choosing [facility] at [facility] to provide your oncology and hematology care.  To afford each patient quality time with our provider, please arrive at least 15 minutes before your scheduled appointment time.   Beginning January 23rd 2017 lab work for the Cancer Center will be done in the  Main lab at [place] on 1st floor. If you have a lab appointment with the Cancer Center please come in thru the  Main Entrance and check in at the main information desk  You need to re-schedule your appointment should you arrive 10 or more minutes late.  We strive to give you quality time with our providers, and arriving late affects you and other patients whose appointments are after yours.  Also, if you no show three or more times for appointments you may be dismissed from the clinic at the providers discretion.     Again, thank you for choosing [facility].  Our hope is that these requests will decrease the amount of time that you wait before being seen by our physicians.       _____________________________________________________________  Should you have questions after your visit to [facility], please contact our office at [phone] between the hours of 8:30 a.m. and 4:30 p.m.  Voicemails left after 4:30 p.m. will not be returned until the following business day.  For prescription refill requests, have your pharmacy contact our office.         Resources For Cancer Patients and their Caregivers ? American Cancer Society: Can assist with transportation, wigs, general needs, runs Look Good Feel Better.        1-888-227-6333 ? Cancer Care: Provides financial assistance, online support groups, medication/co-pay assistance.   1-800-813-HOPE (4673) ? [facility] Assists [place] cancer patients and their families through emotional , educational and financial support.  336-427-4357 ? [place] DSS Where to apply for food stamps, Medicaid and utility assistance. 336-342-1394 ? [facility]: Transportation to medical appointments. 336-347-2287 ? Social Security Administration: May apply for disability if have a Stage IV cancer. 336-342-7796 1-800-772-1213 ? [place] Aging, Disability and Transit Services: Assists with nutrition, care and transit needs. 336-349-2343  

## 2016-01-08 ENCOUNTER — Encounter (INDEPENDENT_AMBULATORY_CARE_PROVIDER_SITE_OTHER): Payer: Self-pay

## 2016-01-08 ENCOUNTER — Ambulatory Visit (INDEPENDENT_AMBULATORY_CARE_PROVIDER_SITE_OTHER): Payer: Medicare Other | Admitting: Pulmonary Disease

## 2016-01-08 ENCOUNTER — Encounter: Payer: Self-pay | Admitting: Pulmonary Disease

## 2016-01-08 VITALS — BP 138/94 | HR 95 | Ht 73.0 in | Wt 292.0 lb

## 2016-01-08 DIAGNOSIS — G4710 Hypersomnia, unspecified: Secondary | ICD-10-CM

## 2016-01-08 DIAGNOSIS — R05 Cough: Secondary | ICD-10-CM | POA: Diagnosis not present

## 2016-01-08 DIAGNOSIS — K219 Gastro-esophageal reflux disease without esophagitis: Secondary | ICD-10-CM | POA: Diagnosis not present

## 2016-01-08 DIAGNOSIS — J189 Pneumonia, unspecified organism: Secondary | ICD-10-CM

## 2016-01-08 DIAGNOSIS — R058 Other specified cough: Secondary | ICD-10-CM

## 2016-01-08 MED ORDER — FLUTICASONE PROPIONATE 50 MCG/ACT NA SUSP: 2.0000 | Freq: Every day | NASAL | Status: DC

## 2016-01-08 MED ORDER — RANITIDINE HCL 150 MG PO TABS: 150.0000 mg | ORAL_TABLET | Freq: Two times a day (BID) | ORAL | Status: DC

## 2016-01-08 NOTE — Assessment & Plan Note (Signed)
Pt with some hypersomnia, snoring, apnea. Denies choking. Has obesity. Airway class III. Will observe for now. May need noc ox. Told him to have his sleep observed.

## 2016-01-08 NOTE — Assessment & Plan Note (Signed)
Pt with GERD. Cont zantac.  Diet changes.

## 2016-01-08 NOTE — Assessment & Plan Note (Signed)
Pt has a 69 PY smoking history, quit in 2010. Denies any asthma, copd, allergies.  Pt with history of Colonic CA and R renal CA in 2010. Had colectomy, nephrectomy, colostomy in 2010. Also, had Ctx. Has been in remission.  Sees Dr. Whitney Muse. Pt gets annual Chest ct scan for CA surveillance.  Patient had chest CT scan in 07/2015 which showed B infiltrates, L>R, ground glass like and nodular infiltrates. CT scan in Oct 2016 was done as part of surveillance. He ended up being on 1 month with Abx. He denied having sx then but was given abx for 1 month. He had a rpt chest CT scan in 08/2015 which showed significant improvement of infiltrates. He was referred to Dr. Luan Pulling (pulmonologist) at [facility] in Dec 2016. He was give a 3week course of Abx again.  He had a rpt chest ct scan in 10/23/15 which showed patchy GGlike infiltrates, more at base.  He was advised bronchoscopy so he was referred to this clinic.   I d/w pt the chest ct scan findings. I personally reviewed all CT scan images and reports on this pt. (Oct 2016, Nov 2016, Jan 2017) The Jan 2017 chest ct scan is mildly worse than 08/2015 ct scan.  The worst is oct 2016 ct scan.   Differentials for B GG like infiltrates/reticular  infiltrates: 1. Possible asp pna/pneumonitis. Pt has GERD but is controlled. 2. Related to UACS. Pt has sinus congestion and repeated PND. 3. Possible infxn -- no signs of infxn now.  4. Inflammation. 5. Less likely CA 6. Possible ILD -- less likely at this point.   Patient had a repeat chest CT scan in March/2017. It was normal. No infiltrates, nodules, masses seen. During that time, he was on treatment for  UACS and GERD.  Hold off on further chest CT scan. Continue Rx of UACS and GERD.

## 2016-01-08 NOTE — Patient Instructions (Signed)
1. Continue with the Flonase, 2 squirts per nostril at bedtime. 2. Start/continue with ranitidine (zantac) 150 mg 2x/day  Return to clinic in 1 year.

## 2016-01-08 NOTE — Progress Notes (Signed)
Subjective:    Patient ID: [name], male    DOB: 12-11-1945, 70 y.o.   MRN: [id number]  HPI  ROV (01/08/2016) pt returns as f/u on his abnormal chest ct scan. Seen last seen, he had a rpt chest ct scan in 11/2015 which did NOT show any infiltrates. We Rx GERD and UACS.  He has been doing well. Uses flonase. Stopped using GERD meds.  Has not been admitted nor has been on abx since last seen.     Review of Systems  Constitutional: Negative.   HENT: Positive for congestion.   Eyes: Negative.   Respiratory: Positive for shortness of breath.   Cardiovascular: Negative.   Gastrointestinal: Positive for abdominal pain.  Endocrine: Negative.   Genitourinary: Negative.   Musculoskeletal: Negative.   Skin: Negative.   Allergic/Immunologic: Negative.   Neurological: Negative.   Hematological: Negative.   Psychiatric/Behavioral: Negative.   All other systems reviewed and are negative.      Objective:   Physical Exam  Vitals:  Filed Vitals:   01/08/16 1228  BP: 138/94  Pulse: 95  Height: 6\' 1"  (1.854 m)  Weight: 292 lb (132.45 kg)  SpO2: 96%    Constitutional/General:  Pleasant, well-nourished, well-developed, not in any distress,  Comfortably seating.  Well kempt  Body mass index is 38.53 kg/(m^2). Wt Readings from Last 3 Encounters:  01/08/16 292 lb (132.45 kg)  11/07/15 290 lb 6.4 oz (131.725 kg)  09/05/15 294 lb 3.2 oz (133.448 kg)    Neck circumference:   HEENT: Pupils equal and reactive to light and accommodation. Anicteric sclerae. Normal nasal mucosa.   No oral  lesions,  mouth clear,  oropharynx clear, no postnasal drip. (-) Oral thrush. No dental caries.  Airway - Mallampati class III  Neck: No masses. Midline trachea. No JVD, (-) LAD. (-) bruits appreciated.  Respiratory/Chest: Grossly normal chest. (-) deformity. (-) Accessory muscle use.  Symmetric expansion. (-) Tenderness on palpation.  Resonant on percussion.  Diminished BS on both lower lung  zones. (-) wheezing, crackles, rhonchi (-) egophony  Cardiovascular: Regular rate and  rhythm, heart sounds normal, no murmur or gallops, no peripheral edema  Gastrointestinal:  Normal bowel sounds. Soft, non-tender. No hepatosplenomegaly.  (-) masses.   Musculoskeletal:  Normal muscle tone. Normal gait.   Extremities: Grossly normal. (-) clubbing, cyanosis.  (-) edema  Skin: (-) rash,lesions seen.   Neurological/Psychiatric : alert, oriented to time, place, person. Normal mood and affect          Assessment & Plan:  Pneumonia Pt has a 37 PY smoking history, quit in 2010. Denies any asthma, copd, allergies.  Pt with history of Colonic CA and R renal CA in 2010. Had colectomy, nephrectomy, colostomy in 2010. Also, had Ctx. Has been in remission.  Sees Dr. Whitney Muse. Pt gets annual Chest ct scan for CA surveillance.  Patient had chest CT scan in 07/2015 which showed B infiltrates, L>R, ground glass like and nodular infiltrates. CT scan in Oct 2016 was done as part of surveillance. He ended up being on 1 month with Abx. He denied having sx then but was given abx for 1 month. He had a rpt chest CT scan in 08/2015 which showed significant improvement of infiltrates. He was referred to Dr. Luan Pulling (pulmonologist) at [facility] in Dec 2016. He was give a 3week course of Abx again.  He had a rpt chest ct scan in 10/23/15 which showed patchy GGlike infiltrates, more at base.  He was  advised bronchoscopy so he was referred to this clinic.   I d/w pt the chest ct scan findings. I personally reviewed all CT scan images and reports on this pt. (Oct 2016, Nov 2016, Jan 2017) The Jan 2017 chest ct scan is mildly worse than 08/2015 ct scan.  The worst is oct 2016 ct scan.   Differentials for B GG like infiltrates/reticular  infiltrates: 1. Possible asp pna/pneumonitis. Pt has GERD but is controlled. 2. Related to UACS. Pt has sinus congestion and repeated PND. 3. Possible infxn -- no signs of  infxn now.  4. Inflammation. 5. Less likely CA 6. Possible ILD -- less likely at this point.   Patient had a repeat chest CT scan in March/2017. It was normal. No infiltrates, nodules, masses seen. During that time, he was on treatment for  UACS and GERD.  Hold off on further chest CT scan. Continue Rx of UACS and GERD.    Upper airway cough syndrome Pt with uacs and PND. Cont flonase 2 sq/nostril at hs.     GERD (gastroesophageal reflux disease) Pt with GERD. Cont zantac.  Diet changes.     Hypersomnia Pt with some hypersomnia, snoring, apnea. Denies choking. Has obesity. Airway class III. Will observe for now. May need noc ox. Told him to have his sleep observed.     RTC in 1 yr, sooner if with [place], MD 01/08/2016, 2:08 PM [facility] Pager [phone] After 3 pm or if no answer, call [phone]

## 2016-01-08 NOTE — Assessment & Plan Note (Signed)
Pt with uacs and PND. Cont flonase 2 sq/nostril at hs.

## 2016-02-19 ENCOUNTER — Encounter (HOSPITAL_COMMUNITY): Payer: Medicare Other | Attending: Hematology and Oncology

## 2016-02-19 VITALS — BP 118/70 | HR 64 | Temp 98.0°F | Resp 18

## 2016-02-19 DIAGNOSIS — Z95828 Presence of other vascular implants and grafts: Secondary | ICD-10-CM | POA: Insufficient documentation

## 2016-02-19 DIAGNOSIS — Z85038 Personal history of other malignant neoplasm of large intestine: Secondary | ICD-10-CM | POA: Diagnosis not present

## 2016-02-19 DIAGNOSIS — Z85528 Personal history of other malignant neoplasm of kidney: Secondary | ICD-10-CM | POA: Diagnosis present

## 2016-02-19 DIAGNOSIS — C189 Malignant neoplasm of colon, unspecified: Secondary | ICD-10-CM

## 2016-02-19 LAB — CBC WITH DIFFERENTIAL/PLATELET
BASOS ABS: 0 10*3/uL (ref 0.0–0.1)
BASOS PCT: 0 %
Eosinophils Absolute: 0.3 10*3/uL (ref 0.0–0.7)
Eosinophils Relative: 5 %
HCT: 36.6 % — ABNORMAL LOW (ref 39.0–52.0)
Hemoglobin: 11.8 g/dL — ABNORMAL LOW (ref 13.0–17.0)
LYMPHS ABS: 1.2 10*3/uL (ref 0.7–4.0)
LYMPHS PCT: 21 %
MCH: 29.7 pg (ref 26.0–34.0)
MCHC: 32.2 g/dL (ref 30.0–36.0)
MCV: 92.2 fL (ref 78.0–100.0)
MONOS PCT: 11 %
Monocytes Absolute: 0.6 10*3/uL (ref 0.1–1.0)
NEUTROS ABS: 3.6 10*3/uL (ref 1.7–7.7)
NEUTROS PCT: 63 %
PLATELETS: 194 10*3/uL (ref 150–400)
RBC: 3.97 MIL/uL — ABNORMAL LOW (ref 4.22–5.81)
RDW: 14.9 % (ref 11.5–15.5)
WBC: 5.7 10*3/uL (ref 4.0–10.5)

## 2016-02-19 LAB — COMPREHENSIVE METABOLIC PANEL
ALBUMIN: 4 g/dL (ref 3.5–5.0)
ALT: 15 U/L — ABNORMAL LOW (ref 17–63)
AST: 11 U/L — AB (ref 15–41)
Alkaline Phosphatase: 52 U/L (ref 38–126)
Anion gap: 6 (ref 5–15)
BUN: 23 mg/dL — AB (ref 6–20)
CHLORIDE: 106 mmol/L (ref 101–111)
CO2: 25 mmol/L (ref 22–32)
Calcium: 8.8 mg/dL — ABNORMAL LOW (ref 8.9–10.3)
Creatinine, Ser: 1.43 mg/dL — ABNORMAL HIGH (ref 0.61–1.24)
GFR calc Af Amer: 57 mL/min — ABNORMAL LOW (ref >60)
GFR, EST NON AFRICAN AMERICAN: 49 mL/min — AB (ref >60)
GLUCOSE: 110 mg/dL — AB (ref 65–99)
POTASSIUM: 4.6 mmol/L (ref 3.5–5.1)
Sodium: 137 mmol/L (ref 135–145)
Total Bilirubin: 0.4 mg/dL (ref 0.3–1.2)
Total Protein: 7.4 g/dL (ref 6.5–8.1)

## 2016-02-19 LAB — PSA: PSA: 2.34 ng/mL (ref 0.00–4.00)

## 2016-02-19 MED ORDER — SODIUM CHLORIDE 0.9% FLUSH
20.0000 mL | INTRAVENOUS | Status: DC | PRN
  Administered 2016-02-19: 20 mL via INTRAVENOUS
  Filled 2016-02-19: qty 20

## 2016-02-19 MED ORDER — HEPARIN SOD (PORK) LOCK FLUSH 100 UNIT/ML IV SOLN
500.0000 [IU] | Freq: Once | INTRAVENOUS | Status: AC
  Administered 2016-02-19: 500 [IU] via INTRAVENOUS
  Filled 2016-02-19: qty 5

## 2016-02-19 NOTE — Patient Instructions (Signed)
[facility] at [facility] Discharge Instructions  RECOMMENDATIONS MADE BY THE CONSULTANT AND ANY TEST RESULTS WILL BE SENT TO YOUR REFERRING PHYSICIAN.  Port flush with labs today.    Thank you for choosing [facility] at [facility] to provide your oncology and hematology care.  To afford each patient quality time with our provider, please arrive at least 15 minutes before your scheduled appointment time.   Beginning January 23rd 2017 lab work for the Cancer Center will be done in the  Main lab at [facility] on 1st floor. If you have a lab appointment with the Cancer Center please come in thru the  Main Entrance and check in at the main information desk  You need to re-schedule your appointment should you arrive 10 or more minutes late.  We strive to give you quality time with our providers, and arriving late affects you and other patients whose appointments are after yours.  Also, if you no show three or more times for appointments you may be dismissed from the clinic at the providers discretion.     Again, thank you for choosing [facility].  Our hope is that these requests will decrease the amount of time that you wait before being seen by our physicians.       _____________________________________________________________  Should you have questions after your visit to [facility], please contact our office at [phone] between the hours of 8:30 a.m. and 4:30 p.m.  Voicemails left after 4:30 p.m. will not be returned until the following business day.  For prescription refill requests, have your pharmacy contact our office.         Resources For Cancer Patients and their Caregivers ? American Cancer Society: Can assist with transportation, wigs, general needs, runs Look Good Feel Better.        1-888-227-6333 ? Cancer Care: Provides financial assistance, online support groups, medication/co-pay assistance.   1-800-813-HOPE (4673) ? [facility] Assists [place] cancer patients and their families through emotional , educational and financial support.  336-427-4357 ? [place] DSS Where to apply for food stamps, Medicaid and utility assistance. 336-342-1394 ? RCATS: Transportation to medical appointments. 336-347-2287 ? Social Security Administration: May apply for disability if have a Stage IV cancer. 336-342-7796 1-800-772-1213 ? [place] Aging, Disability and Transit Services: Assists with nutrition, care and transit needs. 336-349-2343  Cancer Center Support Programs: @10RELATIVEDAYS@ > Cancer Support Group  2nd Tuesday of the month 1pm-2pm, Journey Room  > Creative Journey  3rd Tuesday of the month 1130am-1pm, Journey Room  > Look Good Feel Better  1st Wednesday of the month 10am-12 noon, Journey Room (Call American Cancer Society to register 1-800-395-5775)    

## 2016-02-19 NOTE — Progress Notes (Signed)
[name] presented for Portacath access and flush. Proper placement of portacath confirmed by CXR. Portacath located left chest wall accessed with  H 20 needle. Good blood return present.  Specimen drawn for labs.   Portacath flushed with 24ml NS and 500U/43ml Heparin and needle removed intact. Procedure without incident. Patient tolerated procedure well.

## 2016-02-20 ENCOUNTER — Encounter (HOSPITAL_COMMUNITY): Payer: Medicare Other

## 2016-02-20 LAB — CEA: CEA: 4.4 ng/mL (ref 0.0–4.7)

## 2016-03-05 ENCOUNTER — Ambulatory Visit (HOSPITAL_COMMUNITY): Payer: Medicare Other | Admitting: Hematology & Oncology

## 2016-03-06 ENCOUNTER — Encounter (HOSPITAL_COMMUNITY): Payer: Medicare Other | Attending: Oncology | Admitting: Oncology

## 2016-03-06 VITALS — BP 123/67 | HR 82 | Temp 99.2°F | Resp 18 | Wt 303.2 lb

## 2016-03-06 DIAGNOSIS — Z85528 Personal history of other malignant neoplasm of kidney: Secondary | ICD-10-CM | POA: Diagnosis not present

## 2016-03-06 DIAGNOSIS — E611 Iron deficiency: Secondary | ICD-10-CM

## 2016-03-06 DIAGNOSIS — Z85030 Personal history of malignant carcinoid tumor of large intestine: Secondary | ICD-10-CM

## 2016-03-06 DIAGNOSIS — C189 Malignant neoplasm of colon, unspecified: Secondary | ICD-10-CM

## 2016-03-06 NOTE — Patient Instructions (Addendum)
[place] at [facility] Discharge Instructions  RECOMMENDATIONS MADE BY THE CONSULTANT AND ANY TEST RESULTS WILL BE SENT TO YOUR REFERRING PHYSICIAN.  Labs in 6 months and see MD in 6 months    Thank you for choosing [place] at [facility] to provide your oncology and hematology care.  To afford each patient quality time with our provider, please arrive at least 15 minutes before your scheduled appointment time.   Beginning January 23rd 2017 lab work for the [facility] will be done in the  Main lab at [facility] on 1st floor. If you have a lab appointment with the [place] [facility] please come in thru the  Main Entrance and check in at the main information desk  You need to re-schedule your appointment should you arrive 10 or more minutes late.  We strive to give you quality time with our providers, and arriving late affects you and other patients whose appointments are after yours.  Also, if you no show three or more times for appointments you may be dismissed from the clinic at the providers discretion.     Again, thank you for choosing [facility].  Our hope is that these requests will decrease the amount of time that you wait before being seen by our physicians.       _____________________________________________________________  Should you have questions after your visit to [facility], please contact our office at [phone] between the hours of 8:30 a.m. and 4:30 p.m.  Voicemails left after 4:30 p.m. will not be returned until the following business day.  For prescription refill requests, have your pharmacy contact our office.         Resources For Cancer Patients and their Caregivers ? American Cancer Society: Can assist with transportation, wigs, general needs, runs Look Good Feel Better.        (315) 234-5252 ? Cancer Care: Provides financial assistance, online support groups, medication/co-pay  assistance.  1-800-813-HOPE 941 387 1755) ? [facility] Assists [place] cancer patients and their families through emotional , educational and financial support.  2723393057 ? [place] DSS Where to apply for food stamps, Medicaid and utility assistance. 431 275 0779 ? RCATS: Transportation to medical appointments. 2563122375 ? Social Security Administration: May apply for disability if have a Stage IV cancer. 860-511-5183 3345504150 ? [employer], Disability and Transit Services: Assists with nutrition, care and transit needs. [facility] Support Programs: @10RELATIVEDAYS @ > Cancer Support Group  2nd Tuesday of the month 1pm-2pm, Journey Room  > Creative Journey  3rd Tuesday of the month 1130am-1pm, Journey Room  > Look Good Feel Better  1st Wednesday of the month 10am-12 noon, Journey Room (Call [facility] to register [phone])

## 2016-03-06 NOTE — Assessment & Plan Note (Addendum)
Stage IIIB (T3N2AM0) adenocarcinoma of colon, S/P descending colon and rectal stump resection in September 2010 followed by chemotherapy consisting of FOLFOX.  Oncology history is up-to-date.  Colonoscopy was performed by Dr. Oneida Alar on 02/17/2015.  He will need a repeat in 3 years (May 2019).  He has completed annual CT imaging surveillance per NCCN guidelines  Labs today as ordered: CBC diff, CMET, CEA.   He has been followed by pulmonology.  Most recent visit resulted in an annual follow-up appointment.  He notes issue regarding lab work being covered from an [employer] representative.  This resulted in a $500 bill.  He asks for some advice regarding this.  He has contacted "social security" and [employer] and Billing regarding this.  He is suspicious for a " conspiracy."  I will message our director for his input.  Return in 6 months for follow-up.  If all is well, will space out appointments to annual follow-ups.

## 2016-03-06 NOTE — Assessment & Plan Note (Signed)
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 NED.

## 2016-03-06 NOTE — Progress Notes (Signed)
Dwayne Bellow, MD [place] [address]  Adenocarcinoma of colon [facility]) - Plan: CBC with Differential, Comprehensive metabolic panel, CEA, Ferritin  History of kidney cancer excluding renal pelvis - Plan: CBC with Differential, Comprehensive metabolic panel  Iron deficiency - Plan: Ferritin  CURRENT THERAPY: Surveillance per NCCN guidelines  INTERVAL HISTORY: [name] 70 y.o. male returns for followup of Stage IIIB (T3, N2a, M0) adenocarcinoma of colon, S/P descending colon and rectal stump resection in September 2010 followed by chemotherapy consisting of FOLFOX. 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    Adenocarcinoma of colon ([place])   06/26/2009 Initial Diagnosis Invasive adenocarcinoma   06/28/2009 Surgery Invasive adenocarcinoma with two lesions, both extending inot the pericolonic adipose tissue, maximum tumor size was 6.0 cm with 4/32 lymph nodes positive for disease. pT3, pN2, pMx   10/16/2009 - 03/20/2010 Chemotherapy FOLFOX x 12 cycles   07/03/2015 Imaging CT CAP- NED    Remission     I personally reviewed and went over laboratory results with the patient.  The results are noted within this dictation.  I personally reviewed and went over radiographic studies with the patient.  The results are noted within this dictation.    Chart reviewed.  He has followed with Pulmonology who has decreased frequency of appointment to annual visit.  The patient notes an issue with a [place] bill from last year.  Review of Systems  Constitutional: Negative.  Negative for weight loss.  HENT: Negative.   Eyes: Negative.   Respiratory: Negative.  Negative for cough, hemoptysis and sputum production.   Cardiovascular: Negative.   Gastrointestinal: Negative.  Negative for nausea, vomiting, abdominal pain, diarrhea, constipation, blood in stool and melena.  Genitourinary: Negative.   Musculoskeletal: Negative.     Skin: Negative.   Neurological: Negative.   Endo/Heme/Allergies: Negative.   Psychiatric/Behavioral: Negative.     Past Medical History  Diagnosis Date  . Adenocarcinoma of colon ([place]) 2010    Stage IIIB (T3, N2a, M0), colectomy and chemotherapy with FOLFOX  . Type 2 diabetes mellitus ([place])   . Chronic atrial fibrillation ([place])   . Neuropathy ([place])   . Port catheter in place   . Thoracic aortic aneurysm ([place])   . CKD (chronic kidney disease) stage 2, GFR 60-89 ml/min   . Essential hypertension   . Mixed hyperlipidemia   . Renal cancer ([place]) 2010    Clear cell renal cancer status post nephrectomy     Past Surgical History  Procedure Laterality Date  . Colectomy    . Nephrectomy    . Colonoscopy N/A 02/17/2015    Procedure: COLONOSCOPY;  Surgeon: Danie Binder, MD;  Location: [facility];  Service: Endoscopy;  Laterality: N/A;  1400 via colostomy - moved to 2:45 - office to notify    Family History  Problem Relation Age of Onset  . Breast cancer Mother   . Colon cancer Brother   . Colon cancer Maternal Grandfather     Social History   Social History  . Marital Status: Married    Spouse Name: N/A  . Number of Children: N/A  . Years of Education: N/A   Social History Main Topics  . Smoking status: Former Smoker    Types: Cigarettes  . Smokeless tobacco: Never Used  . Alcohol Use: 0.0 oz/week    0 Standard drinks or equivalent per week     Comment:  Occasional gin and tonic  . Drug Use: No  . Sexual Activity: Not on file   Other Topics Concern  . Not on file   Social History Narrative     PHYSICAL EXAMINATION  ECOG PERFORMANCE STATUS: 0 - Asymptomatic  Filed Vitals:   03/06/16 1317  BP: 123/67  Pulse: 82  Temp: 99.2 F (37.3 C)  Resp: 18    GENERAL:alert, no distress, well nourished, well developed, comfortable, cooperative, obese, smiling and unaccompanied SKIN: skin color, texture, turgor are normal, no rashes or significant lesions HEAD:  Normocephalic, No masses, lesions, tenderness or abnormalities EYES: normal, EOMI, Conjunctiva are pink and non-injected EARS: External ears normal OROPHARYNX:lips, buccal mucosa, and tongue normal and mucous membranes are moist  NECK: supple, trachea midline LYMPH:  no palpable lymphadenopathy BREAST:not examined LUNGS: clear to auscultation and percussion HEART: regular rate & rhythm, no murmurs and no gallops ABDOMEN:abdomen soft and normal bowel sounds BACK: Back symmetric, no curvature. EXTREMITIES:less then 2 second capillary refill, no joint deformities, effusion, or inflammation, no skin discoloration, no cyanosis  NEURO: alert & oriented x 3 with fluent speech, no focal motor/sensory deficits, gait normal   LABORATORY DATA: CBC    Component Value Date/Time   WBC 5.7 02/19/2016 1550   RBC 3.97* 02/19/2016 1550   HGB 11.8* 02/19/2016 1550   HCT 36.6* 02/19/2016 1550   PLT 194 02/19/2016 1550   MCV 92.2 02/19/2016 1550   MCH 29.7 02/19/2016 1550   MCHC 32.2 02/19/2016 1550   RDW 14.9 02/19/2016 1550   LYMPHSABS 1.2 02/19/2016 1550   MONOABS 0.6 02/19/2016 1550   EOSABS 0.3 02/19/2016 1550   BASOSABS 0.0 02/19/2016 1550      Chemistry      Component Value Date/Time   NA 137 02/19/2016 1550   K 4.6 02/19/2016 1550   CL 106 02/19/2016 1550   CO2 25 02/19/2016 1550   BUN 23* 02/19/2016 1550   CREATININE 1.43* 02/19/2016 1550      Component Value Date/Time   CALCIUM 8.8* 02/19/2016 1550   ALKPHOS 52 02/19/2016 1550   AST 11* 02/19/2016 1550   ALT 15* 02/19/2016 1550   BILITOT 0.4 02/19/2016 1550     Lab Results  Component Value Date   CEA 4.4 02/19/2016     PENDING LABS:   RADIOGRAPHIC STUDIES:  No results found.   PATHOLOGY:    ASSESSMENT AND PLAN:  Adenocarcinoma of colon ([place]) Stage IIIB (T3N2AM0) adenocarcinoma of colon, S/P descending colon and rectal stump resection in September 2010 followed by chemotherapy consisting of  FOLFOX.  Oncology history is up-to-date.  Colonoscopy was performed by Dr. Oneida Alar on 02/17/2015.  He will need a repeat in 3 years (May 2019).  He has completed annual CT imaging surveillance per NCCN guidelines  Labs today as ordered: CBC diff, CMET, CEA.   He has been followed by pulmonology.  Most recent visit resulted in an annual follow-up appointment.  He notes issue regarding lab work being covered from an [employer] representative.  This resulted in a $500 bill.  He asks for some advice regarding this.  He has contacted "social security" and [employer] and Billing regarding this.  He is suspicious for a "[place] conspiracy."  I will message our director for his input.  Return in 6 months for follow-up.  If all is well, will space out appointments to annual follow-ups.  History of kidney cancer excluding renal pelvis Right clear cell renal cancer, S/P nephrectomy in Sept  2010. NED.    ORDERS PLACED FOR THIS ENCOUNTER: Orders Placed This Encounter  Procedures  . CBC with Differential  . Comprehensive metabolic panel  . CEA  . Ferritin    MEDICATIONS PRESCRIBED THIS ENCOUNTER: No orders of the defined types were placed in this encounter.    THERAPY PLAN:  NCCN guidelines for surveillance for Colon cancer are as follows (1.2017):  A. Stage I   1. Colonoscopy at year 1    A. If advanced adenoma, repeat in 1 year    B. If no advanced adenoma, repeat in 3 years, and then every 5 years.  B. Stage II, Stage III   1. H+P every 3-6 months x 2 years and then every 6 months for a total of 5 years    2. CEA every 3-6 months x 2 years and then every 6 months for a total of 5 years    3. CT CAP every 6-12 months (category 2B for frequency < 12 months) for a total of 5 years .   4.  Colonoscopy in 1 year except if no preoperative colonoscopy due to obstructing lesion, colonoscopy in 3-6 months.     A. If advanced adenoma, repeat in 1 year    B. If no advanced adenoma,  repeat in 3 years, then every 5 years   5. PET/CT scan is not recommended.  C. Stage IV   1. H+P every 3-6 months x 2 years and then every 6 months for a total of 5 years    2. CEA every 3 months x 2 years and then every 6 months for a total of 3- 5 years    3. CT CAP every 3-6 months (category 2B for frequency < 6 months) x 2 years., then every 6-12 months for a total of 5 years .   4. Colonoscopy in 1 year except if no preoperative colonoscopy due to obstructing lesion, colonoscopy in 3-6 months.     A. If advanced adenoma, repeat in 1 year    B. If no advanced adenoma, repeat in 3 years, then every 5 years   All questions were answered. The patient knows to call the clinic with any problems, questions or concerns. We can certainly see the patient much sooner if necessary.  Patient and plan discussed with Dr. Ancil Linsey and she is in agreement with the aforementioned.   This note is electronically signed by: Doy Mince 03/06/2016 7:21 PM

## 2016-03-13 ENCOUNTER — Other Ambulatory Visit: Payer: Self-pay

## 2016-03-13 MED ORDER — FLUTICASONE PROPIONATE 50 MCG/ACT NA SUSP: 2.0000 | Freq: Every day | NASAL | Status: DC

## 2016-03-13 MED ORDER — RANITIDINE HCL 150 MG PO TABS: 150.0000 mg | ORAL_TABLET | Freq: Two times a day (BID) | ORAL | Status: DC

## 2016-04-16 ENCOUNTER — Encounter (HOSPITAL_COMMUNITY): Payer: Medicare Other | Attending: Hematology & Oncology

## 2016-04-16 VITALS — BP 114/86 | HR 86 | Temp 98.2°F | Resp 20 | Wt 300.4 lb

## 2016-04-16 DIAGNOSIS — Z85030 Personal history of malignant carcinoid tumor of large intestine: Secondary | ICD-10-CM | POA: Diagnosis not present

## 2016-04-16 DIAGNOSIS — Z95828 Presence of other vascular implants and grafts: Secondary | ICD-10-CM

## 2016-04-16 DIAGNOSIS — Z452 Encounter for adjustment and management of vascular access device: Secondary | ICD-10-CM

## 2016-04-16 MED ORDER — HEPARIN SOD (PORK) LOCK FLUSH 100 UNIT/ML IV SOLN
INTRAVENOUS | Status: AC
  Filled 2016-04-16: qty 5

## 2016-04-16 MED ORDER — SODIUM CHLORIDE 0.9% FLUSH: 10.0000 mL | INTRAVENOUS | Status: DC | PRN

## 2016-04-16 MED ORDER — HEPARIN SOD (PORK) LOCK FLUSH 100 UNIT/ML IV SOLN
500.0000 [IU] | Freq: Once | INTRAVENOUS | Status: AC
  Administered 2016-04-16: 500 [IU] via INTRAVENOUS

## 2016-04-16 NOTE — Progress Notes (Signed)
[name] presented for Portacath access and flush.  Proper placement of portacath confirmed by CXR.  Portacath located left chest wall accessed with  H 20 needle.  Good blood return present. Portacath flushed with 80ml NS and 500U/58ml Heparin and needle removed intact.  Procedure tolerated well and without incident.

## 2016-04-16 NOTE — Patient Instructions (Signed)
[facility] at [facility] Discharge Instructions  RECOMMENDATIONS MADE BY THE CONSULTANT AND ANY TEST RESULTS WILL BE SENT TO YOUR REFERRING PHYSICIAN.  Port flushed today without issues. Follow-up as scheduled. Call clinic for any questions or concerns  Thank you for choosing [facility] at [facility] to provide your oncology and hematology care.  To afford each patient quality time with our provider, please arrive at least 15 minutes before your scheduled appointment time.   Beginning January 23rd 2017 lab work for the Cancer Center will be done in the  Main lab at [facility] on 1st floor. If you have a lab appointment with the Cancer Center please come in thru the  Main Entrance and check in at the main information desk  You need to re-schedule your appointment should you arrive 10 or more minutes late.  We strive to give you quality time with our providers, and arriving late affects you and other patients whose appointments are after yours.  Also, if you no show three or more times for appointments you may be dismissed from the clinic at the providers discretion.     Again, thank you for choosing [facility].  Our hope is that these requests will decrease the amount of time that you wait before being seen by our physicians.       _____________________________________________________________  Should you have questions after your visit to [facility], please contact our office at [phone] between the hours of 8:30 a.m. and 4:30 p.m.  Voicemails left after 4:30 p.m. will not be returned until the following business day.  For prescription refill requests, have your pharmacy contact our office.         Resources For Cancer Patients and their Caregivers ? American Cancer Society: Can assist with transportation, wigs, general needs, runs Look Good Feel Better.        1-888-227-6333 ? Cancer Care: Provides  financial assistance, online support groups, medication/co-pay assistance.  1-800-813-HOPE (4673) ? [facility] Assists [place] cancer patients and their families through emotional , educational and financial support.  336-427-4357 ? [place] DSS Where to apply for food stamps, Medicaid and utility assistance. 336-342-1394 ? RCATS: Transportation to medical appointments. 336-347-2287 ? Social Security Administration: May apply for disability if have a Stage IV cancer. 336-342-7796 1-800-772-1213 ? [place] Aging, Disability and Transit Services: Assists with nutrition, care and transit needs. 336-349-2343  Cancer Center Support Programs: @10RELATIVEDAYS@ > Cancer Support Group  2nd Tuesday of the month 1pm-2pm, Journey Room  > Creative Journey  3rd Tuesday of the month 1130am-1pm, Journey Room  > Look Good Feel Better  1st Wednesday of the month 10am-12 noon, Journey Room (Call American Cancer Society to register 1-800-395-5775)   

## 2016-05-23 ENCOUNTER — Telehealth: Payer: Self-pay

## 2016-05-23 NOTE — Telephone Encounter (Signed)
I spoke to Dr. Oneida Alar and she advised that pt see Dr. Arnoldo Morale since he did the surgery.  I called his office and spoke with Claiborne Billings and she made him an appointment for 05/30/2016 at 10:00 Am.   I have called and informed pt and gave him the date, time and address and phone number.

## 2016-05-23 NOTE — Telephone Encounter (Signed)
REVIEWED-NO ADDITIONAL RECOMMENDATIONS. 

## 2016-05-23 NOTE — Telephone Encounter (Signed)
Pt came by the office with concerns about 2  red wounds about 1/2 in each above his umbilical on the scar from his cancer surgery in 2010.   He said he doesn't know what to do, since his PCP, Dr. Karie Kirks, will only allow him to discuss one problem at each visit.   He said these red places have been there for about one month.  He has appt with Dr. Karie Kirks in September and that is for his pain medication prescription and he has to have that so he will not bother him with another issue.   I offered to call Dr. Karie Kirks and let him know what was going on and he said NO, NO WAY. HE DOES NOT WANT TO DISTURB HIM BECAUSE OF THE WAY HE HAS REACTED TO HIS NEEDS.  He is not having any GI problems at this time.  I spoke to [place] and she also spoke to pt.  The red open places have very little clear drainage.  Pt has not used anything on it.  He also would like to find a pain clinic to go to so he will not have to get his pain meds from Dr. Karie Kirks.   Pt is concerned since he had cancer previously and did not know where to start to get help.  Please advise!

## 2016-07-08 ENCOUNTER — Encounter (HOSPITAL_COMMUNITY): Payer: Medicare Other | Attending: Hematology & Oncology

## 2016-07-08 ENCOUNTER — Encounter (HOSPITAL_COMMUNITY): Payer: Self-pay

## 2016-07-08 DIAGNOSIS — Z85038 Personal history of other malignant neoplasm of large intestine: Secondary | ICD-10-CM | POA: Diagnosis present

## 2016-07-08 DIAGNOSIS — Z85528 Personal history of other malignant neoplasm of kidney: Secondary | ICD-10-CM

## 2016-07-08 MED ORDER — HEPARIN SOD (PORK) LOCK FLUSH 100 UNIT/ML IV SOLN
INTRAVENOUS | Status: AC
  Filled 2016-07-08: qty 5

## 2016-07-08 MED ORDER — SODIUM CHLORIDE 0.9% FLUSH
10.0000 mL | INTRAVENOUS | Status: DC | PRN
  Administered 2016-07-08: 10 mL via INTRAVENOUS
  Filled 2016-07-08: qty 10

## 2016-07-08 MED ORDER — HEPARIN SOD (PORK) LOCK FLUSH 100 UNIT/ML IV SOLN
500.0000 [IU] | Freq: Once | INTRAVENOUS | Status: AC
  Administered 2016-07-08: 500 [IU] via INTRAVENOUS

## 2016-07-08 NOTE — Patient Instructions (Signed)
[facility] at [facility] Discharge Instructions  RECOMMENDATIONS MADE BY THE CONSULTANT AND ANY TEST RESULTS WILL BE SENT TO YOUR REFERRING PHYSICIAN.  Port flush today. Return as scheduled for port flushes. Return as scheduled for office visit.   Thank you for choosing [facility] at [facility] to provide your oncology and hematology care.  To afford each patient quality time with our provider, please arrive at least 15 minutes before your scheduled appointment time.   Beginning January 23rd 2017 lab work for the Cancer Center will be done in the  Main lab at [facility] on 1st floor. If you have a lab appointment with the Cancer Center please come in thru the  Main Entrance and check in at the main information desk  You need to re-schedule your appointment should you arrive 10 or more minutes late.  We strive to give you quality time with our providers, and arriving late affects you and other patients whose appointments are after yours.  Also, if you no show three or more times for appointments you may be dismissed from the clinic at the providers discretion.     Again, thank you for choosing [facility].  Our hope is that these requests will decrease the amount of time that you wait before being seen by our physicians.       _____________________________________________________________  Should you have questions after your visit to  Cancer Center, please contact our office at [phone] between the hours of 8:30 a.m. and 4:30 p.m.  Voicemails left after 4:30 p.m. will not be returned until the following business day.  For prescription refill requests, have your pharmacy contact our office.         Resources For Cancer Patients and their Caregivers ? American Cancer Society: Can assist with transportation, wigs, general needs, runs Look Good Feel Better.        1-888-227-6333 ? Cancer Care: Provides financial  assistance, online support groups, medication/co-pay assistance.  1-800-813-HOPE (4673) ? [facility] Assists [place] cancer patients and their families through emotional , educational and financial support.  336-427-4357 ? [place] DSS Where to apply for food stamps, Medicaid and utility assistance. 336-342-1394 ? RCATS: Transportation to medical appointments. 336-347-2287 ? Social Security Administration: May apply for disability if have a Stage IV cancer. 336-342-7796 1-800-772-1213 ? [place] Aging, Disability and Transit Services: Assists with nutrition, care and transit needs. 336-349-2343  Cancer Center Support Programs: @10RELATIVEDAYS@ > Cancer Support Group  2nd Tuesday of the month 1pm-2pm, Journey Room  > Creative Journey  3rd Tuesday of the month 1130am-1pm, Journey Room  > Look Good Feel Better  1st Wednesday of the month 10am-12 noon, Journey Room (Call American [employer] to register 1-800-395-5775)   

## 2016-07-08 NOTE — Progress Notes (Signed)
[name] presented for Portacath access and flush. Portacath located left chest wall accessed with  H 20 needle.  Good blood return present. Portacath flushed with 50ml NS and 500U/16ml Heparin and needle removed intact.  Procedure tolerated well and without incident.

## 2016-09-05 ENCOUNTER — Encounter (HOSPITAL_COMMUNITY): Payer: Self-pay | Admitting: Oncology

## 2016-09-05 ENCOUNTER — Encounter (HOSPITAL_COMMUNITY): Payer: Medicare Other | Attending: Hematology & Oncology | Admitting: Oncology

## 2016-09-05 ENCOUNTER — Encounter (HOSPITAL_BASED_OUTPATIENT_CLINIC_OR_DEPARTMENT_OTHER): Payer: Medicare Other

## 2016-09-05 DIAGNOSIS — Z85528 Personal history of other malignant neoplasm of kidney: Secondary | ICD-10-CM

## 2016-09-05 DIAGNOSIS — Z95828 Presence of other vascular implants and grafts: Secondary | ICD-10-CM

## 2016-09-05 DIAGNOSIS — Z85038 Personal history of other malignant neoplasm of large intestine: Secondary | ICD-10-CM

## 2016-09-05 DIAGNOSIS — C189 Malignant neoplasm of colon, unspecified: Secondary | ICD-10-CM

## 2016-09-05 LAB — COMPREHENSIVE METABOLIC PANEL
ALBUMIN: 4.2 g/dL (ref 3.5–5.0)
ALT: 16 U/L — ABNORMAL LOW (ref 17–63)
ANION GAP: 8 (ref 5–15)
AST: 12 U/L — ABNORMAL LOW (ref 15–41)
Alkaline Phosphatase: 52 U/L (ref 38–126)
BUN: 21 mg/dL — ABNORMAL HIGH (ref 6–20)
CALCIUM: 9.2 mg/dL (ref 8.9–10.3)
CHLORIDE: 102 mmol/L (ref 101–111)
CO2: 26 mmol/L (ref 22–32)
Creatinine, Ser: 1.51 mg/dL — ABNORMAL HIGH (ref 0.61–1.24)
GFR calc non Af Amer: 46 mL/min — ABNORMAL LOW (ref >60)
GFR, EST AFRICAN AMERICAN: 53 mL/min — AB (ref >60)
GLUCOSE: 128 mg/dL — AB (ref 65–99)
POTASSIUM: 4.3 mmol/L (ref 3.5–5.1)
SODIUM: 136 mmol/L (ref 135–145)
Total Bilirubin: 0.3 mg/dL (ref 0.3–1.2)
Total Protein: 7.6 g/dL (ref 6.5–8.1)

## 2016-09-05 LAB — CBC WITH DIFFERENTIAL/PLATELET
BASOS PCT: 0 %
Basophils Absolute: 0 10*3/uL (ref 0.0–0.1)
EOS ABS: 0.2 10*3/uL (ref 0.0–0.7)
EOS PCT: 3 %
HCT: 40.3 % (ref 39.0–52.0)
Hemoglobin: 12.9 g/dL — ABNORMAL LOW (ref 13.0–17.0)
LYMPHS ABS: 1.7 10*3/uL (ref 0.7–4.0)
Lymphocytes Relative: 24 %
MCH: 29.7 pg (ref 26.0–34.0)
MCHC: 32 g/dL (ref 30.0–36.0)
MCV: 92.6 fL (ref 78.0–100.0)
MONOS PCT: 14 %
Monocytes Absolute: 1 10*3/uL (ref 0.1–1.0)
NEUTROS PCT: 59 %
Neutro Abs: 4.1 10*3/uL (ref 1.7–7.7)
PLATELETS: 234 10*3/uL (ref 150–400)
RBC: 4.35 MIL/uL (ref 4.22–5.81)
RDW: 13.7 % (ref 11.5–15.5)
WBC: 7 10*3/uL (ref 4.0–10.5)

## 2016-09-05 LAB — FERRITIN: Ferritin: 41 ng/mL (ref 24–336)

## 2016-09-05 MED ORDER — HEPARIN SOD (PORK) LOCK FLUSH 100 UNIT/ML IV SOLN
INTRAVENOUS | Status: AC
  Filled 2016-09-05: qty 5

## 2016-09-05 MED ORDER — SODIUM CHLORIDE 0.9% FLUSH
10.0000 mL | INTRAVENOUS | Status: DC | PRN
  Administered 2016-09-05: 10 mL via INTRAVENOUS
  Filled 2016-09-05: qty 10

## 2016-09-05 MED ORDER — HEPARIN SOD (PORK) LOCK FLUSH 100 UNIT/ML IV SOLN
500.0000 [IU] | Freq: Once | INTRAVENOUS | Status: AC
  Administered 2016-09-05: 500 [IU] via INTRAVENOUS

## 2016-09-05 NOTE — Patient Instructions (Addendum)
[place] at [facility] Discharge Instructions  RECOMMENDATIONS MADE BY THE CONSULTANT AND ANY TEST RESULTS WILL BE SENT TO YOUR REFERRING PHYSICIAN.  You saw Kirby Crigler, PA-C, today. Lab today. Follow up with labs in 12 months. Call if you want GI referral for stoma evaluation. See Amy at checkout for appointments.  Thank you for choosing [place] at [facility] to provide your oncology and hematology care.  To afford each patient quality time with our provider, please arrive at least 15 minutes before your scheduled appointment time.   Beginning January 23rd 2017 lab work for the [facility] will be done in the  Main lab at [facility] on 1st floor. If you have a lab appointment with the [facility] please come in thru the  Main Entrance and check in at the main information desk  You need to re-schedule your appointment should you arrive 10 or more minutes late.  We strive to give you quality time with our providers, and arriving late affects you and other patients whose appointments are after yours.  Also, if you no show three or more times for appointments you may be dismissed from the clinic at the providers discretion.     Again, thank you for choosing [facility].  Our hope is that these requests will decrease the amount of time that you wait before being seen by our physicians.       _____________________________________________________________  Should you have questions after your visit to [facility], please contact our office at [phone] between the hours of 8:30 a.m. and 4:30 p.m.  Voicemails left after 4:30 p.m. will not be returned until the following business day.  For prescription refill requests, have your pharmacy contact our office.         Resources For Cancer Patients and their Caregivers ? American Cancer Society: Can assist with transportation, wigs, general needs, runs Look  Good Feel Better.        (540)341-0229 ? Cancer Care: Provides financial assistance, online support groups, medication/co-pay assistance.  1-800-813-HOPE 484 353 0469) ? [facility] Assists [place] cancer patients and their families through emotional , educational and financial support.  5120039281 ? [place] DSS Where to apply for food stamps, Medicaid and utility assistance. 202-605-4113 ? RCATS: Transportation to medical appointments. 409-057-7531 ? Social Security Administration: May apply for disability if have a Stage IV cancer. 205-187-8484 807-376-3321 ? [employer], Disability and Transit Services: Assists with nutrition, care and transit needs. [place] Support Programs: @10RELATIVEDAYS @ > Cancer Support Group  2nd Tuesday of the month 1pm-2pm, Journey Room  > Creative Journey  3rd Tuesday of the month 1130am-1pm, Journey Room  > Look Good Feel Better  1st Wednesday of the month 10am-12 noon, Journey Room (Call [place] to register [phone])

## 2016-09-05 NOTE — Assessment & Plan Note (Addendum)
Stage IIIB (T3N2AM0) adenocarcinoma of colon, S/P descending colon and rectal stump resection in September 2010 followed by chemotherapy consisting of FOLFOX.  Oncology history is up-to-date.  Labs today: CBC diff, CMET, CEA, ferritin.  These will be completed following patient's appointment.  We will update the patient with results when available.  Labs in 12 months: CBC diff, CMET, CEA, ferritin.  He kindly showed me to areas at his stoma that he is concerned about being polyps.  I have offered a referral to GI for further evaluation in regards to this.  On exam, it is unimpressive.  Colonoscopy was performed by Dr. Oneida Alar on 02/17/2015.  He will need a repeat in 3 years (May 2019).  He has completed annual CT imaging surveillance per NCCN guidelines.  No further imaging is recommended for surveillance purposes, but can be performed anytime when clinically indicated.  I personally reviewed and went over radiographic studies with the patient.  The results are noted within this dictation.  CT chest images are reviewed.  Return in 12 months for follow-up.

## 2016-09-05 NOTE — Assessment & Plan Note (Signed)
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 NED.

## 2016-09-05 NOTE — Progress Notes (Signed)
Dwayne Bellow, MD [place] [address]  Adenocarcinoma of colon [facility])  History of kidney cancer excluding renal pelvis  CURRENT THERAPY: Surveillance per NCCN guidelines  INTERVAL HISTORY: [name] 70 y.o. male returns for followup of Stage IIIB (T3, N2a, M0) adenocarcinoma of colon, S/P descending colon and rectal stump resection in September 2010 followed by chemotherapy consisting of FOLFOX. 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    Adenocarcinoma of colon ([place])   06/26/2009 Initial Diagnosis    Invasive adenocarcinoma      06/28/2009 Surgery    Invasive adenocarcinoma with two lesions, both extending inot the pericolonic adipose tissue, maximum tumor size was 6.0 cm with 4/32 lymph nodes positive for disease. pT3, pN2, pMx      10/16/2009 - 03/20/2010 Chemotherapy    FOLFOX x 12 cycles      07/03/2015 Imaging    CT CAP- NED       Remission          He asks, "what does a polyp look like?"  I asked him if he has ever Googled a picture to see for himself and he reports that he has not.  I brought up [employer] on the computer and showed him what a pedunculated polyp looks like.  He reports two small areas at his stoma site.  They are small and in the inferior aspect of his stoma site.    He otherwise denies any complaints.  Review of Systems  Constitutional: Negative.  Negative for chills, fever and weight loss.  HENT: Negative.   Eyes: Negative.   Respiratory: Negative.  Negative for cough.   Cardiovascular: Negative for chest pain.  Gastrointestinal: Negative.  Negative for constipation, diarrhea, nausea and vomiting.  Genitourinary: Negative.   Musculoskeletal: Negative.   Skin: Negative.   Neurological: Negative.   Endo/Heme/Allergies: Negative.   Psychiatric/Behavioral: Negative.     Past Medical History:  Diagnosis Date  . Adenocarcinoma of colon ([place]) 2010   Stage IIIB (T3, N2a,  M0), colectomy and chemotherapy with FOLFOX  . Chronic atrial fibrillation ([place])   . CKD (chronic kidney disease) stage 2, GFR 60-89 ml/min   . Essential hypertension   . Mixed hyperlipidemia   . Neuropathy ([place])   . Port catheter in place   . Renal cancer ([place]) 2010   Clear cell renal cancer status post nephrectomy   . Thoracic aortic aneurysm ([place])   . Type 2 diabetes mellitus ([place])     Past Surgical History:  Procedure Laterality Date  . COLECTOMY    . COLONOSCOPY N/A 02/17/2015   Procedure: COLONOSCOPY;  Surgeon: Danie Binder, MD;  Location: [facility];  Service: Endoscopy;  Laterality: N/A;  1400 via colostomy - moved to 2:45 - office to notify  . NEPHRECTOMY      Family History  Problem Relation Age of Onset  . Breast cancer Mother   . Colon cancer Maternal Grandfather   . Colon cancer Brother     Social History   Social History  . Marital status: Married    Spouse name: N/A  . Number of children: N/A  . Years of education: N/A   Social History Main Topics  . Smoking status: Former Smoker    Types: Cigarettes  . Smokeless tobacco: Never Used  . Alcohol use 0.0 oz/week     Comment: Occasional gin and tonic  . Drug use:  No  . Sexual activity: Not Asked   Other Topics Concern  . None   Social History Narrative  . None     PHYSICAL EXAMINATION  ECOG PERFORMANCE STATUS: 0 - Asymptomatic  Vitals:   09/05/16 1308  BP: 131/84  Pulse: (!) 116  Resp: 18  Temp: 99.1 F (37.3 C)    GENERAL:alert, no distress, well nourished, well developed, comfortable, cooperative, obese, smiling and unaccompanied SKIN: skin color, texture, turgor are normal, no rashes or significant lesions HEAD: Normocephalic, No masses, lesions, tenderness or abnormalities EYES: normal, EOMI, Conjunctiva are pink and non-injected EARS: External ears normal OROPHARYNX:lips, buccal mucosa, and tongue normal and mucous membranes are moist  NECK: supple, trachea midline LYMPH:  no  palpable lymphadenopathy BREAST:not examined LUNGS: clear to auscultation and percussion HEART: regular rate & rhythm, no murmurs, no gallops, S1 normal and S2 normal ABDOMEN:abdomen soft, obese, normal bowel sounds and RLQ colostomy producing stool appropriately, 2 small whitish in color areas in the inferolateral aspect of stoma measuring 1 mm in size. BACK: Back symmetric, no curvature. EXTREMITIES:less then 2 second capillary refill, no joint deformities, effusion, or inflammation, no skin discoloration, no cyanosis  NEURO: alert & oriented x 3 with fluent speech, no focal motor/sensory deficits, gait normal   LABORATORY DATA: CBC    Component Value Date/Time   WBC 7.0 09/05/2016 1400   RBC 4.35 09/05/2016 1400   HGB 12.9 (L) 09/05/2016 1400   HCT 40.3 09/05/2016 1400   PLT 234 09/05/2016 1400   MCV 92.6 09/05/2016 1400   MCH 29.7 09/05/2016 1400   MCHC 32.0 09/05/2016 1400   RDW 13.7 09/05/2016 1400   LYMPHSABS 1.7 09/05/2016 1400   MONOABS 1.0 09/05/2016 1400   EOSABS 0.2 09/05/2016 1400   BASOSABS 0.0 09/05/2016 1400      Chemistry      Component Value Date/Time   NA 136 09/05/2016 1400   K 4.3 09/05/2016 1400   CL 102 09/05/2016 1400   CO2 26 09/05/2016 1400   BUN 21 (H) 09/05/2016 1400   CREATININE 1.51 (H) 09/05/2016 1400      Component Value Date/Time   CALCIUM 9.2 09/05/2016 1400   ALKPHOS 52 09/05/2016 1400   AST 12 (L) 09/05/2016 1400   ALT 16 (L) 09/05/2016 1400   BILITOT 0.3 09/05/2016 1400        PENDING LABS:   RADIOGRAPHIC STUDIES:  No results found.   PATHOLOGY:    ASSESSMENT AND PLAN:  Adenocarcinoma of colon ([place]) Stage IIIB (T3N2AM0) adenocarcinoma of colon, S/P descending colon and rectal stump resection in September 2010 followed by chemotherapy consisting of FOLFOX.  Oncology history is up-to-date.  Labs today: CBC diff, CMET, CEA, ferritin.  These will be completed following patient's appointment.  We will update the  patient with results when available.  Labs in 12 months: CBC diff, CMET, CEA, ferritin.  He kindly showed me to areas at his stoma that he is concerned about being polyps.  I have offered a referral to GI for further evaluation in regards to this.  On exam, it is unimpressive.  Colonoscopy was performed by Dr. Oneida Alar on 02/17/2015.  He will need a repeat in 3 years (May 2019).  He has completed annual CT imaging surveillance per NCCN guidelines.  No further imaging is recommended for surveillance purposes, but can be performed anytime when clinically indicated.  I personally reviewed and went over radiographic studies with the patient.  The results are noted within this dictation.  CT chest images are reviewed.  Return in 12 months for follow-up.   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 NED.   ORDERS PLACED FOR THIS ENCOUNTER: No orders of the defined types were placed in this encounter.   MEDICATIONS PRESCRIBED THIS ENCOUNTER: Meds ordered this encounter  Medications  . gabapentin (NEURONTIN) 600 MG tablet    Sig: Take 600 mg by mouth 4 (four) times daily.    THERAPY PLAN:  NCCN guidelines for surveillance for Colon cancer are as follows (1.2017):  A. Stage I   1. Colonoscopy at year 1    A. If advanced adenoma, repeat in 1 year    B. If no advanced adenoma, repeat in 3 years, and then every 5 years.  B. Stage II, Stage III   1. H+P every 3-6 months x 2 years and then every 6 months for a total of 5 years    2. CEA every 3-6 months x 2 years and then every 6 months for a total of 5 years    3. CT CAP every 6-12 months (category 2B for frequency < 12 months) for a total of 5 years .   4.  Colonoscopy in 1 year except if no preoperative colonoscopy due to obstructing lesion, colonoscopy in 3-6 months.     A. If advanced adenoma, repeat in 1 year    B. If no advanced adenoma, repeat in 3 years, then every 5  years   5. PET/CT scan is not recommended.  C. Stage IV   1. H+P every 3-6 months x 2 years and then every 6 months for a total of 5 years    2. CEA every 3 months x 2 years and then every 6 months for a total of 3- 5 years    3. CT CAP every 3-6 months (category 2B for frequency < 6 months) x 2 years., then every 6-12 months for a total of 5 years .   4. Colonoscopy in 1 year except if no preoperative colonoscopy due to obstructing lesion, colonoscopy in 3-6 months.     A. If advanced adenoma, repeat in 1 year    B. If no advanced adenoma, repeat in 3 years, then every 5 years   All questions were answered. The patient knows to call the clinic with any problems, questions or concerns. We can certainly see the patient much sooner if necessary.  Patient and plan discussed with Dr. Ancil Linsey and she is in agreement with the aforementioned.   This note is electronically signed by: Doy Mince 09/05/2016 5:40 PM

## 2016-09-05 NOTE — Progress Notes (Signed)
Marcellina Millin presented for Portacath access and flush.  Portacath located left chest wall accessed with  H 20 needle.  Good blood return present. Portacath flushed with 55ml NS and 500U/41ml Heparin and needle removed intact.  Procedure tolerated well and without incident.

## 2016-09-06 LAB — CEA: CEA: 3.7 ng/mL (ref 0.0–4.7)

## 2016-10-10 ENCOUNTER — Emergency Department (HOSPITAL_COMMUNITY)
Admission: EM | Admit: 2016-10-10 | Discharge: 2016-10-10 | Disposition: A | Discharge status: Home / Self Care | Payer: Medicare Other | Attending: Emergency Medicine | Admitting: Emergency Medicine

## 2016-10-10 ENCOUNTER — Encounter (HOSPITAL_COMMUNITY): Payer: Self-pay | Admitting: *Deleted

## 2016-10-10 DIAGNOSIS — Z85038 Personal history of other malignant neoplasm of large intestine: Secondary | ICD-10-CM | POA: Diagnosis not present

## 2016-10-10 DIAGNOSIS — N182 Chronic kidney disease, stage 2 (mild): Secondary | ICD-10-CM | POA: Insufficient documentation

## 2016-10-10 DIAGNOSIS — Z7984 Long term (current) use of oral hypoglycemic drugs: Secondary | ICD-10-CM | POA: Insufficient documentation

## 2016-10-10 DIAGNOSIS — I129 Hypertensive chronic kidney disease with stage 1 through stage 4 chronic kidney disease, or unspecified chronic kidney disease: Secondary | ICD-10-CM | POA: Insufficient documentation

## 2016-10-10 DIAGNOSIS — Z79899 Other long term (current) drug therapy: Secondary | ICD-10-CM | POA: Diagnosis not present

## 2016-10-10 DIAGNOSIS — Z85528 Personal history of other malignant neoplasm of kidney: Secondary | ICD-10-CM | POA: Diagnosis not present

## 2016-10-10 DIAGNOSIS — R002 Palpitations: Secondary | ICD-10-CM | POA: Insufficient documentation

## 2016-10-10 DIAGNOSIS — Z7901 Long term (current) use of anticoagulants: Secondary | ICD-10-CM | POA: Diagnosis not present

## 2016-10-10 DIAGNOSIS — E1122 Type 2 diabetes mellitus with diabetic chronic kidney disease: Secondary | ICD-10-CM | POA: Insufficient documentation

## 2016-10-10 DIAGNOSIS — R110 Nausea: Secondary | ICD-10-CM

## 2016-10-10 DIAGNOSIS — Z87891 Personal history of nicotine dependence: Secondary | ICD-10-CM | POA: Diagnosis not present

## 2016-10-10 DIAGNOSIS — M2550 Pain in unspecified joint: Secondary | ICD-10-CM | POA: Diagnosis not present

## 2016-10-10 DIAGNOSIS — K922 Gastrointestinal hemorrhage, unspecified: Secondary | ICD-10-CM | POA: Diagnosis not present

## 2016-10-10 DIAGNOSIS — R1010 Upper abdominal pain, unspecified: Secondary | ICD-10-CM | POA: Diagnosis present

## 2016-10-10 LAB — COMPREHENSIVE METABOLIC PANEL
ALT: 13 U/L — AB (ref 17–63)
AST: 10 U/L — AB (ref 15–41)
Albumin: 3.5 g/dL (ref 3.5–5.0)
Alkaline Phosphatase: 41 U/L (ref 38–126)
Anion gap: 6 (ref 5–15)
BUN: 14 mg/dL (ref 6–20)
CHLORIDE: 106 mmol/L (ref 101–111)
CO2: 25 mmol/L (ref 22–32)
Calcium: 9.1 mg/dL (ref 8.9–10.3)
Creatinine, Ser: 1.32 mg/dL — ABNORMAL HIGH (ref 0.61–1.24)
GFR calc Af Amer: >60 mL/min (ref >60)
GFR, EST NON AFRICAN AMERICAN: 54 mL/min — AB (ref >60)
Glucose, Bld: 146 mg/dL — ABNORMAL HIGH (ref 65–99)
Potassium: 3.9 mmol/L (ref 3.5–5.1)
Sodium: 137 mmol/L (ref 135–145)
Total Bilirubin: 1 mg/dL (ref 0.3–1.2)
Total Protein: 6.7 g/dL (ref 6.5–8.1)

## 2016-10-10 LAB — CBC
HCT: 34.7 % — ABNORMAL LOW (ref 39.0–52.0)
Hemoglobin: 11.7 g/dL — ABNORMAL LOW (ref 13.0–17.0)
MCH: 30.3 pg (ref 26.0–34.0)
MCHC: 33.7 g/dL (ref 30.0–36.0)
MCV: 89.9 fL (ref 78.0–100.0)
PLATELETS: 190 10*3/uL (ref 150–400)
RBC: 3.86 MIL/uL — ABNORMAL LOW (ref 4.22–5.81)
RDW: 13.6 % (ref 11.5–15.5)
WBC: 5.9 10*3/uL (ref 4.0–10.5)

## 2016-10-10 LAB — PROTIME-INR
INR: 3.24
Prothrombin Time: 33.8 seconds — ABNORMAL HIGH (ref 11.4–15.2)

## 2016-10-10 LAB — TYPE AND SCREEN
ABO/RH(D): AB POS
Antibody Screen: NEGATIVE

## 2016-10-10 LAB — LIPASE, BLOOD: Lipase: 18 U/L (ref 11–51)

## 2016-10-10 MED ORDER — ONDANSETRON HCL 4 MG PO TABS: 4.0000 mg | ORAL_TABLET | Freq: Four times a day (QID) | ORAL | 0 refills | Status: DC

## 2016-10-10 MED ORDER — ONDANSETRON HCL 4 MG/2ML IJ SOLN
4.0000 mg | Freq: Once | INTRAMUSCULAR | Status: AC
  Administered 2016-10-10: 4 mg via INTRAVENOUS
  Filled 2016-10-10: qty 2

## 2016-10-10 MED ORDER — SODIUM CHLORIDE 0.9 % IV SOLN
1000.0000 mL | Freq: Once | INTRAVENOUS | Status: AC
  Administered 2016-10-10: 1000 mL via INTRAVENOUS

## 2016-10-10 MED ORDER — SODIUM CHLORIDE 0.9 % IV SOLN
1000.0000 mL | INTRAVENOUS | Status: DC
  Administered 2016-10-10: 1000 mL via INTRAVENOUS

## 2016-10-10 NOTE — ED Triage Notes (Signed)
Pt comes in for upper abdominal pain starting 2 weeks ago. Pt states his pain is mild. He has had nausea accompanied by this. Pt has a stoma that he has seen blood in. Pt is on warfarin and states he stopped this 4 days ago and hasn't seen blood since. NAD noted.

## 2016-10-10 NOTE — ED Provider Notes (Signed)
[place] DEPT Provider Note   CSN: [id number] Arrival date & time: 10/10/16  0705     History   Chief Complaint Chief Complaint  Patient presents with  . Abdominal Pain    HPI [name] is a 71 y.o. male.  Patient is a 71 year old male who presents to the emergency department with a complaint of abdominal pain and nausea. Patient has a history of colon cancer and right kidney cancer. He has a colostomy. He states that he's had about 2 weeks of the upper abdomen pain. Recently he has been accompanied by nausea. The patient is also noted some blood on in the stool from his stoma. It is of note that he is on Coumadin. He states that a few weeks ago when he had his INR checked that it was over 3. He has not taken Coumadin over the last 4 days. He states the bleeding seems to have stopped. He continues however to have some abdominal pain and some gas, and nausea. He presents to the emergency department for assistance with these issues.    The history is provided by the patient.    Past Medical History:  Diagnosis Date  . Adenocarcinoma of colon ([place]) 2010   Stage IIIB (T3, N2a, M0), colectomy and chemotherapy with FOLFOX  . Chronic atrial fibrillation ([place])   . CKD (chronic kidney disease) stage 2, GFR 60-89 ml/min   . Essential hypertension   . Mixed hyperlipidemia   . Neuropathy ([place])   . Port catheter in place   . 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 Thoracic aortic aneurysm ([place])   . Type 2 diabetes mellitus [facility])     Patient Active Problem List   Diagnosis Date Noted  . Pneumonia 11/07/2015  . Abnormal CT scan, chest 11/07/2015  . GERD (gastroesophageal reflux disease) 11/07/2015  . Upper airway cough syndrome 11/07/2015  . Hypersomnia 11/07/2015  . History of colon cancer   . History of colonic polyps 02/01/2015  . History of kidney cancer excluding renal pelvis 06/19/2013  . Port catheter in place 11/16/2012    . Adenocarcinoma of colon ([place]) 05/31/2011    Past Surgical History:  Procedure Laterality Date  . COLECTOMY    . COLONOSCOPY N/A 02/17/2015   Procedure: COLONOSCOPY;  Surgeon: Danie Binder, MD;  Location: [facility] ENDO SUITE;  Service: Endoscopy;  Laterality: N/A;  1400 via colostomy - moved to 2:45 - office to notify  . NEPHRECTOMY         Home Medications    Prior to Admission medications   Medication Sig Start Date End Date Taking? Authorizing Provider  diltiazem (CARDIZEM) 60 MG tablet Take 60 mg by mouth daily.     Historical Provider, MD  DULoxetine (CYMBALTA) 30 MG capsule Take 2 capsules by mouth 2 (two) times daily.  12/13/15   Historical Provider, MD  ferrous sulfate 325 (65 FE) MG tablet Take 325 mg by mouth daily with breakfast.    Historical Provider, MD  fluticasone (FLONASE) 50 MCG/ACT nasal spray Place 2 sprays into both nostrils at bedtime. 03/13/16   [place], MD  gabapentin (NEURONTIN) 600 MG tablet Take 600 mg by mouth 4 (four) times daily.    Historical Provider, MD  HYDROcodone-acetaminophen (NORCO) 10-325 MG tablet Take 1-2 tablets by mouth 4 (four) times daily. As needed 12/20/15   Historical Provider, MD  metFORMIN (GLUCOPHAGE) 1000 MG tablet Take 1,000 mg by  mouth 2 (two) times daily with a meal.      Historical Provider, MD  Multiple Vitamins-Minerals (COMPLETE MULTIVITAMIN/MINERAL PO) Take 1 tablet by mouth daily.    Historical Provider, MD  pioglitazone (ACTOS) 45 MG tablet Take 45 mg by mouth daily.      Historical Provider, MD  ranitidine (ZANTAC) 150 MG tablet Take 1 tablet (150 mg total) by mouth 2 (two) times daily. 03/13/16   [place], MD  simvastatin (ZOCOR) 80 MG tablet Take 80 mg by mouth daily. Reported on 01/08/2016    Historical Provider, MD  vitamin B-12 (CYANOCOBALAMIN) 1000 MCG tablet Take 2,000 mcg by mouth daily. Reported on 04/16/2016    Historical Provider, MD  Vitamins-Lipotropics (EAR HEALTH PLUS PO) Take 1 tablet by  mouth daily.    Historical Provider, MD  warfarin (COUMADIN) 5 MG tablet Take 5 mg by mouth daily. 5 mg daily Sun, Mon, Wed, and Fri. 10 mg on Tues, Thurs and Sat    Historical Provider, MD    Family History Family History  Problem Relation Age of Onset  . Breast cancer Mother   . Colon cancer Maternal Grandfather   . Colon cancer Brother     Social History Social History  Substance Use Topics  . Smoking status: Former Smoker    Types: Cigarettes  . Smokeless tobacco: Never Used  . Alcohol use 0.0 oz/week     Comment: Occasional gin and tonic     Allergies   Patient has no known allergies.   Review of Systems Review of Systems  Constitutional: Negative for activity change.       All ROS Neg except as noted in HPI  HENT: Negative for nosebleeds.   Eyes: Negative for photophobia and discharge.  Respiratory: Negative for cough, shortness of breath and wheezing.   Cardiovascular: Positive for palpitations. Negative for chest pain.  Gastrointestinal: Positive for abdominal pain, blood in stool and nausea.  Genitourinary: Negative for dysuria, frequency and hematuria.  Musculoskeletal: Positive for arthralgias. Negative for back pain and neck pain.  Skin: Negative.   Neurological: Negative for dizziness, seizures and speech difficulty.  Psychiatric/Behavioral: Negative for confusion and hallucinations.     Physical Exam Updated Vital Signs BP 137/87 (BP Location: Left Arm)   Pulse 79   Temp 98 F (36.7 C) (Oral)   Resp 18   Ht 6\' 2"  (1.88 m)   Wt (!) 140.6 kg   SpO2 97%   BMI 39.80 kg/m   Physical Exam  Constitutional: He is oriented to person, place, and time. He appears well-developed and well-nourished.  Non-toxic appearance.  HENT:  Head: Normocephalic.  Right Ear: Tympanic membrane and external ear normal.  Left Ear: Tympanic membrane and external ear normal.  Eyes: EOM and lids are normal. Pupils are equal, round, and reactive to light.  Neck: Normal  range of motion. Neck supple. Carotid bruit is not present.  Cardiovascular: Normal rate, regular rhythm, normal heart sounds, intact distal pulses and normal pulses.   Pulmonary/Chest: Breath sounds normal. No respiratory distress.  Abdominal: Soft. Bowel sounds are normal. There is no tenderness. There is no guarding.  Right upper quadrant colostomy. No increased redness or red streaks around the colostomy area. The abdomen is soft with good bowel sounds. Abdomen is nontender to palpation. No splenomegaly or hepatomegaly appreciated.  Musculoskeletal: He exhibits no edema.  Stiffness with attempted range of motion of the lower back in the lower extremities.  Lymphadenopathy:  Head (right side): No submandibular adenopathy present.       Head (left side): No submandibular adenopathy present.    He has no cervical adenopathy.  Neurological: He is alert and oriented to person, place, and time. He has normal strength. No cranial nerve deficit or sensory deficit.  Skin: Skin is warm and dry.  Psychiatric: He has a normal mood and affect. His speech is normal.  Nursing note and vitals reviewed.    ED Treatments / Results  Labs (all labs ordered are listed, but only abnormal results are displayed) Labs Reviewed  COMPREHENSIVE METABOLIC PANEL - Abnormal; Notable for the following:       Result Value   Glucose, Bld 146 (*)    Creatinine, Ser 1.32 (*)    AST 10 (*)    ALT 13 (*)    GFR calc non Af Amer 54 (*)    All other components within normal limits  CBC - Abnormal; Notable for the following:    RBC 3.86 (*)    Hemoglobin 11.7 (*)    HCT 34.7 (*)    All other components within normal limits  TYPE AND SCREEN    EKG  EKG Interpretation None       Radiology No results found.  Procedures Procedures (including critical care time)  Medications Ordered in ED Medications - No data to display   Initial Impression / Assessment and Plan / ED Course  I have reviewed the  triage vital signs and the nursing notes.  Pertinent labs & imaging results that were available during my care of the patient were reviewed by me and considered in my medical decision making (see chart for details).  Clinical Course     **I have reviewed nursing notes, vital signs, and all appropriate lab and imaging results for this patient.*  Final Clinical Impressions(s) / ED Diagnoses MDM Vital signs within normal limits. Pulse oximetry is 96% on room air. Lipase normal at 18. Doubt pancreatitis. And pro time is elevated at 33.8, with INR 3.24. It is of note that these results after the patient has been off of his Coumadin for 4 days.  Electrolytes within normal limits. The glucose is elevated at 146. The creatinine is chronically elevated, but elevated today at 132. The total bilirubin is normal at 1. The alkaline phosphatase is normal at 41. CBC shows no acute changes.  Patient treated with IV fluids and Zofran. Patient states after fluids and Zofran that he feels much much better. The patient will be given Zofran prescription. He will be asked to follow-up with his primary physician to regulate his Coumadin, and to see the GI specialist if his abdominal discomfort returns. Patient is in agreement with these plans. I have reviewed the most recent CT scans, as the patient is a cancer patient and no new findings appreciated on recent CT scanning.     Final diagnoses:  None    New Prescriptions New Prescriptions   No medications on file     Lily Kocher, PA-C 10/10/16 1026    Milton Ferguson, MD 10/11/16 [phone]

## 2016-10-10 NOTE — ED Notes (Signed)
Pt has colostomy to RUQ, had since 2010.  History of colon cancer and right kidney cancer.  C/o nausea for last 2 weeks.  Pt is DM 2 and last ate yesterday at 1900 (Mac & cheese).

## 2016-10-10 NOTE — Discharge Instructions (Signed)
Your vital signs in your laboratory workup are nonacute at this time. Please use Zofran every 6 hours as needed for nausea. Please see Dr. Karie Kirks for additional evaluation concerning your Coumadin and your INR. Please see her GI specialist if the bleeding in her colostomy returns.

## 2016-10-31 ENCOUNTER — Encounter (HOSPITAL_COMMUNITY): Payer: Medicare Other | Attending: Oncology

## 2016-10-31 DIAGNOSIS — Z85038 Personal history of other malignant neoplasm of large intestine: Secondary | ICD-10-CM

## 2016-10-31 DIAGNOSIS — Z85528 Personal history of other malignant neoplasm of kidney: Secondary | ICD-10-CM | POA: Diagnosis not present

## 2016-10-31 MED ORDER — SODIUM CHLORIDE 0.9% FLUSH
10.0000 mL | INTRAVENOUS | Status: AC | PRN
  Administered 2016-10-31: 10 mL via INTRAVENOUS
  Filled 2016-10-31: qty 10

## 2016-10-31 MED ORDER — HEPARIN SOD (PORK) LOCK FLUSH 100 UNIT/ML IV SOLN
500.0000 [IU] | Freq: Once | INTRAVENOUS | Status: AC
  Administered 2016-10-31: 500 [IU] via INTRAVENOUS
  Filled 2016-10-31: qty 5

## 2016-11-04 ENCOUNTER — Encounter: Payer: Self-pay | Admitting: Gastroenterology

## 2016-11-13 ENCOUNTER — Ambulatory Visit (INDEPENDENT_AMBULATORY_CARE_PROVIDER_SITE_OTHER): Payer: Medicare Other | Admitting: Gastroenterology

## 2016-11-13 ENCOUNTER — Other Ambulatory Visit: Payer: Self-pay

## 2016-11-13 ENCOUNTER — Encounter: Payer: Self-pay | Admitting: Gastroenterology

## 2016-11-13 DIAGNOSIS — Z859 Personal history of malignant neoplasm, unspecified: Secondary | ICD-10-CM

## 2016-11-13 DIAGNOSIS — R109 Unspecified abdominal pain: Secondary | ICD-10-CM | POA: Insufficient documentation

## 2016-11-13 DIAGNOSIS — K9401 Colostomy hemorrhage: Secondary | ICD-10-CM | POA: Insufficient documentation

## 2016-11-13 MED ORDER — DICYCLOMINE HCL 10 MG PO CAPS: ORAL_CAPSULE | ORAL | 11 refills | Status: DC

## 2016-11-13 NOTE — Patient Instructions (Addendum)
TO PREVENT DIARRHEA OR ABDOMINAL CRAMPS:   1. FOLLOW A LOW DAIRY DIET. SEE INFO BELOW.   2. IF YOU CONSUME DAIRY, ADD LACTASE 3 PILLS WITH MEALS UP TO THREE TIMES A DAY.  3. TAKE DICYCLOMINE 30 MINUTES PRIOR TO BREAKFAST AND IF NEEDED TAKE 30 MINS PRIOR TO LUNCH. IT MAY CAUSE DROWSINESS, DRY EYES/MOUTH, BLURRY VISION, OR DIFFICULTY URINATING.   SEE DR. Arnoldo Morale TO ASSESS YOUR OSTOMY AND BIOPSY IF NEEDED.    PLEASE CALL IN SIX WEEK MONTH IF SYMPTOMS ARE NOT IMPROVED. IF YOUR ABDOMINAL PAIN IS NOT IMPROVED YOU WILL NEED A CT SCAN.  FOLLOW UP IN 4 MOS.    Lactose Free Diet Lactose is a carbohydrate that is found mainly in milk and milk products, as well as in foods with added milk or whey. Lactose must be digested by the enzyme in order to be used by the body. Lactose intolerance occurs when there is a shortage of lactase. When your body is not able to digest lactose, you may feel sick to your stomach (nausea), bloating, cramping, gas and diarrhea.  There are many dairy products that may be tolerated better than milk by some people:  The use of cultured dairy products such as yogurt, buttermilk, cottage cheese, and sweet acidophilus milk (Kefir) for lactase-deficient individuals is usually well tolerated. This is because the healthy bacteria help digest lactose.   Lactose-hydrolyzed milk (Lactaid) contains 40-90% less lactose than milk and may also be well tolerated.    SPECIAL NOTES  Lactose is a carbohydrates. The major food source is dairy products. Reading food labels is important. Many products contain lactose even when they are not made from milk. Look for the following words: whey, milk solids, dry milk solids, nonfat dry milk powder. Typical sources of lactose other than dairy products include breads, candies, cold cuts, prepared and processed foods, and commercial sauces and gravies.   All foods must be prepared without milk, cream, or other dairy foods.   Soy milk and  lactose-free supplements (LACTASE) may be used as an alternative to milk.   FOOD GROUP ALLOWED/RECOMMENDED AVOID/USE SPARINGLY  BREADS / STARCHES 4 servings or more* Breads and rolls made without milk. [place], [place], or [place] bread. Breads and rolls that contain milk. Prepared mixes such as muffins, biscuits, waffles, pancakes. Sweet rolls, donuts, [place] toast (if made with milk or lactose).  Crackers: Soda crackers, graham crackers. Any crackers prepared without lactose. Zwieback crackers, corn curls, or any that contain lactose.  Cereals: Cooked or dry cereals prepared without lactose (read labels). Cooked or dry cereals prepared with lactose (read labels). Total, Cocoa Krispies. Special K.  Potatoes / Pasta / Rice: Any prepared without milk or lactose. Popcorn. Instant potatoes, frozen [place] fries, scalloped or au gratin potatoes.  VEGETABLES 2 servings or more Fresh, frozen, and canned vegetables. Creamed or breaded vegetables. Vegetables in a cheese sauce or with lactose-containing margarines.  FRUIT 2 servings or more All fresh, canned, or frozen fruits that are not processed with lactose. Any canned or frozen fruits processed with lactose.  MEAT & SUBSTITUTES 2 servings or more (4 to 6 oz. total per day) Plain beef, chicken, fish, [place], lamb, veal, pork, or ham. Kosher prepared meat products. Strained or junior meats that do not contain milk. Eggs, soy meat substitutes, nuts. Scrambled eggs, omelets, and souffles that contain milk. Creamed or breaded meat, fish, or fowl. Sausage products such as wieners, liver sausage, or cold cuts that contain milk solids. Cheese, cottage cheese,  or cheese spreads.  MILK None. (See "BEVERAGES" for milk substitutes. See "DESSERTS" for ice cream and frozen desserts.) Milk (whole, 2%, skim, or chocolate). Evaporated, powdered, or condensed milk; malted milk.  SOUPS & COMBINATION FOODS Bouillon, broth, vegetable soups, clear soups, consomms. Homemade  soups made with allowed ingredients. Combination or prepared foods that do not contain milk or milk products (read labels). Cream soups, chowders, commercially prepared soups containing lactose. Macaroni and cheese, pizza. Combination or prepared foods that contain milk or milk products.  DESSERTS & SWEETS In moderation Water and fruit ices; gelatin; angel food cake. Homemade cookies, pies, or cakes made from allowed ingredients. Pudding (if made with water or a milk substitute). Lactose-free tofu desserts. Sugar, honey, corn syrup, jam, jelly; marmalade; molasses (beet sugar); Pure sugar candy; marshmallows. Ice cream, ice milk, sherbet, custard, pudding, frozen yogurt. Commercial cake and cookie mixes. Desserts that contain chocolate. Pie crust made with milk-containing margarine; reduced-calorie desserts made with a sugar substitute that contains lactose. Toffee, peppermint, butterscotch, chocolate, caramels.  FATS & OILS In moderation Butter (as tolerated; contains very small amounts of lactose). Margarines and dressings that do not contain milk, Vegetable oils, shortening, Miracle Whip, mayonnaise, nondairy cream & whipped toppings without lactose or milk solids added (examples: Coffee Rich, Carnation Coffeemate, Rich's Whipped Topping, PolyRich). Berniece Salines. Margarines and salad dressings containing milk; cream, cream cheese; peanut butter with added milk solids, sour cream, chip dips, made with sour cream.  BEVERAGES Carbonated drinks; tea; coffee and freeze-dried coffee; some instant coffees (check labels). Fruit drinks; fruit and vegetable juice; Rice or Soy milk. Ovaltine, hot chocolate. Some cocoas; some instant coffees; instant iced teas; powdered fruit drinks (read labels).   CONDIMENTS / MISCELLANEOUS Soy sauce, carob powder, olives, gravy made with water, baker's cocoa, pickles, pure seasonings and spices, wine, pure monosodium glutamate, catsup, mustard. Some chewing gums, chocolate, some  cocoas. Certain antibiotics and vitamin / mineral preparations. Spice blends if they contain milk products. MSG extender. Artificial sweeteners that contain lactose such as Equal (Nutra-Sweet) and Sweet 'n Low. Some nondairy creamers (read labels).   SAMPLE MENU*  Breakfast   Orange Juice.  Banana.   Bran flakes.   Nondairy Creamer.  [place] Bread (toasted).   Butter or milk-free margarine.   Coffee or tea.    Noon Meal   Chicken Breast.  Rice.   Green beans.   Butter or milk-free margarine.  Fresh melon.   Coffee or tea.    Evening Meal   Roast Beef.  Baked potato.   Butter or milk-free margarine.   Broccoli.   Lettuce salad with vinegar and oil dressing.  [employer]   Coffee or tea.

## 2016-11-13 NOTE — Assessment & Plan Note (Addendum)
APPEARS TO BE DUE TO FRIABLE OSTOMY SITE. LAST TCS MAY 2016 AND GRANULATION TISSUE NOTED AT OSTOMY AND 5 SIMPLE ADENOMAS REMOVED. BIOPSY SHOWED BENIGN GRANULATION TISSUE. PT CONTINUES ON COUMADIN FOR A FIB.   I PERSONALLY SPOKE WITH DR. Arnoldo Morale HE WILL SEE PATIENT TO EVALUATE THE OSTOMY AND WILL BIOPSY/REVISE IF NEEDED.  FOLLOW UP IN 4 MOS.

## 2016-11-13 NOTE — Assessment & Plan Note (Addendum)
CLINICALLY IMPROVED BUT SYMPTOMS NOT IDEALLY CONTROLLED AND ASSOCIATED WITH LOOSE STOOLS. ETIOLOGY UNCLEAR & MAY BE DUE TO ACUTE VIRAL GE, OR MEDS(GLUCOPHAGE),  LESS LIKELY RECURRENT COLON CANCER/PERITONEAL CARCINOMATOSIS.   TO PREVENT DIARRHEA OR ABDOMINAL CRAMPS:  1. FOLLOW A LOW DAIRY DIET.  HANDOUT GIVEN.   2. IF HE CONSUMES DAIRY, ADD LACTASE 3 PILLS WITH MEALS UP TO THREE TIMES A DAY.  3. TAKE DICYCLOMINE 30 MINUTES PRIOR TO BREAKFAST AND IF NEEDED TAKE 30 MINS PRIOR TO LUNCH. MED SIDE EFFECTS DISCUSSED.  I PERSONALLY SPOKE WITH DR. Arnoldo Morale HE WILL SEE PATIENT  TO THE OSTOMY AND BIOPSY/REVISE IF NEEDED. PT WILL  CALL IN SIX WEEK MONTH IF SYMPTOMS ARE NOT IMPROVED. IF ABDOMINAL PAIN IS NOT IMPROVED HE WILL NEED A CT SCAN. FOLLOW UP IN 4 MOS.   GREATER THAN 50% WAS SPENT IN COUNSELING & COORDINATION OF CARE WITH THE PATIENT: DISCUSSED CONTACTING SURGERY DIFFERENTIAL DIAGNOSIS, PROCEDURE, AND MANAGEMENT OF LOOSE STOOLS/ABDOMINALPAIN/BLEEDING FROM OSTOMY SITE. TOTAL ENCOUNTER TIME: 40 MINS.

## 2016-11-13 NOTE — Progress Notes (Signed)
Subjective:    Patient ID: [name], male    DOB: Mar 07, 1946, 71 y.o.   MRN: [id number]  Kayhan Bellow, MD  HPI 6 MOS AGAIN STOOLS WERE DARK BROWN AND VERY FIRM.CHANGE IN BOWEL IN HABITS.  About one mo ago had dry heaves/wretching/ABDOMINAL PAIN(CRAMPS) for two weeks. Able to keep down Raman noodles. Saw PCP AND IT WAS BLEEDING AND SHE SAID GO TO ED. HE DIDN'T GO UNTIL 3 DAYS LATER. HAVING NAUSEA AND STOMACH PILLS. GIVEN PILLS TO CALM STOMACH AND IT PASSED. THINGS ARE NOT NORMAL: STOOL ARE LOOSE IN SPITE OF NORCO. ALSO YELLOW MUCOUSY DISCHARGE. NOT NORMAL COLOR: YELLOW/ORANGE PEEL. STILL DON'T FEEL COMFORTABLE EATING CERTAIN THINGS:  RIBLET SANDWCHES FROM [employer](NOT TRIED SINCE HIS SURGERY). IRON EVERY DAY. ON METFORMIN 1 GM BID FOR YEARS. BEEN OFF CYMBALTA FOR A COUPLE OF MOS BECAUSE HE THOUGHT IT WAS CAUSING HIM TO FALL OUT OF BED AND HITTING HIS HEAD. OVER PAST MO-SYMPTOMS ARE ABOUT THE SAME (STOOLS). ABDOMINAL CRAMPS AND DRY HEAVES ARE GONE. NO ABX LATELY. MAY BLOOD IN HIS BAG WHEN HE'S IN THE SHOWER AND IT IT STOP SHORTLY AFTER HE FINISHES HIS SHOWER. DURING FLARE HE WOULD HAVE VOMITING(NO BLOOD). MILK: NONE. CHEESE: LIKE HIS CHEESE ICE CREAM: VERY LITTLE. FELT HE LOST HIS DIGNITY AT [facility].  PT DENIES FEVER, CHILLS, HEMATEMESIS, melena, CHEST PAIN, SHORTNESS OF BREATH,   constipation, problems swallowing, problems with sedation, heartburn or indigestion. HAS TROUBLE WITH CLEAR NASAL DISCHARGE.  Past Medical History:  Diagnosis Date  . Adenocarcinoma of colon ([place]) 2010   Stage IIIB (T3, N2a, M0), colectomy and chemotherapy with FOLFOX  . Chronic atrial fibrillation ([place])   . CKD (chronic kidney disease) stage 2, GFR 60-89 ml/min   . Essential hypertension   . Mixed hyperlipidemia   . Neuropathy ([place])   . Port catheter in place   . 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 Thoracic aortic aneurysm ([place])   . Type 2 diabetes mellitus ([place])     Past Surgical History:  Procedure Laterality Date  . COLECTOMY    . COLONOSCOPY JUN 2016 02/17/2015     . NEPHRECTOMY     No Known Allergies  Current Outpatient Prescriptions  Medication Sig Dispense Refill  . diltiazem (CARDIZEM) 60 MG tablet Take 60 mg by mouth 2 (two) times daily.     . ferrous sulfate 325 (65 FE) MG tablet Take 325 mg by mouth daily with breakfast.    . HYDROcodone-acetaminophen (NORCO) 10-325 MG tablet Take 1-2 tablets by mouth 4 (four) times daily as needed for moderate pain.     . metFORMIN (GLUCOPHAGE) 1000 MG tablet Take 500 mg by mouth 2 (two) times daily with a meal.     . Multiple Vitamins-Minerals (COMPLETE MULTIVITAMIN/MINERAL PO) Take 1 tablet by mouth daily.    . simvastatin (ZOCOR) 80 MG tablet Take 40 mg by mouth every other day. Reported on 01/08/2016    . Vitamins-Lipotropics (EAR HEALTH PLUS PO) Take 1 tablet by mouth daily.    Marland Kitchen warfarin (COUMADIN) 5 MG tablet Take 5 mg by mouth daily. 5 mg daily (5 days/week)    .      . fluticasone (FLONASE) 50 MCG/ACT nasal spray Place 2 sprays into both nostrils at bedtime. (Patient not taking: Reported on 11/13/2016)    .      . ondansetron (ZOFRAN) 4 MG tablet Take 1 tablet (4 mg total) by  mouth every 6 (six) hours. (Patient not taking: Reported on 11/13/2016)    . pioglitazone (ACTOS) 45 MG tablet Take 45 mg by mouth daily.      . ranitidine (ZANTAC) 150 MG tablet Take 1 tablet (150 mg total) by mouth 2 (two) times daily. (Patient not taking: Reported on 11/13/2016)    . vitamin B-12 (CYANOCOBALAMIN) 1000 MCG tablet Take 2,000 mcg by mouth daily. Reported on 04/16/2016     Review of Systems PER HPI OTHERWISE ALL SYSTEMS ARE NEGATIVE.    Objective:   Physical Exam  Constitutional: He is oriented to person, place, and time. He appears well-developed and well-nourished. No distress.  HENT:  Head: Normocephalic and atraumatic.  Mouth/Throat: Oropharynx is clear and moist. No oropharyngeal exudate.  Eyes:  Pupils are equal, round, and reactive to light. No scleral icterus.  Neck: Normal range of motion. Neck supple.  Cardiovascular: Normal rate and normal heart sounds.   No murmur heard. IRREGULAR RHYTHM  Pulmonary/Chest: Effort normal and breath sounds normal. No respiratory distress.  Abdominal: Soft. Bowel sounds are normal. He exhibits no distension. There is tenderness. There is no rebound and no guarding.    DIGITAL EXAM PERFORMED OF OSTOMY. MILDLY TENDER WHEN GLOVED 5TH DIGIT INTRODUCED. OSTOMY SITE APPEARS MACERATED AND FRIABLE, SOFT TO FIRM TO TOUCH. SOFT, BROWN STOOL AT OSTOMY SITE  INCISIONS WELL HEALED, OBESE ABDOMEN MILD TENDERNESS IN THE PERIUMBILICAL REGION   Musculoskeletal: He exhibits no edema.  Lymphadenopathy:    He has no cervical adenopathy.  Neurological: He is alert and oriented to person, place, and time.  NO  NEW FOCAL DEFICITS  Psychiatric:  FLAT AFFECT, SLIGHTLY ANXIOUS MOOD  Vitals reviewed.     Assessment & Plan:

## 2016-11-13 NOTE — Progress Notes (Signed)
ON RECALL  °

## 2016-11-14 NOTE — Progress Notes (Signed)
ON RECALL  °

## 2016-11-14 NOTE — Progress Notes (Signed)
cc'ed to pcp °

## 2016-11-20 ENCOUNTER — Telehealth: Payer: Self-pay

## 2016-11-20 NOTE — Telephone Encounter (Signed)
Called and pt of appt with surgeon Dr. Arnoldo Morale 11/26/16 at 11:00am.

## 2016-11-26 ENCOUNTER — Ambulatory Visit (INDEPENDENT_AMBULATORY_CARE_PROVIDER_SITE_OTHER): Payer: Medicare Other | Admitting: General Surgery

## 2016-11-26 ENCOUNTER — Encounter: Payer: Self-pay | Admitting: General Surgery

## 2016-11-26 VITALS — BP 164/86 | HR 69 | Temp 96.2°F | Ht 74.0 in | Wt 315.0 lb

## 2016-11-26 DIAGNOSIS — K9400 Colostomy complication, unspecified: Secondary | ICD-10-CM | POA: Diagnosis not present

## 2016-11-26 NOTE — Progress Notes (Signed)
Subjective:     Patient ID: [name], male   DOB: 09/26/1946, 71 y.o.   MRN: [id number]  Patient is a 71 year old white male status post colectomy with colostomy in 2011 who over the past few months had some loose cloudy drainage from his ostomy site. He has had a colonoscopy through the ostomy in 2016 by Dr. Oneida Alar. He states he has had intermittent upper abdominal pain, but this has resolved. Over the past week, his ostomy output has been more well formed. There was the concern of some growths at the ostomy site. He states that the excess tissue around his ostomy has been irritated in the past, but this has been present for many years. He is not currently bleeding or causing him difficulties. He has no pain at the ostomy site at the present time. He was referred for consideration of biopsying the mucosa at the ostomy site. He states he does have IBS.     Review of Systems  Constitutional: Negative.   HENT: Negative.   Eyes: Negative.   Respiratory: Negative.   Cardiovascular: Negative.   Gastrointestinal: Positive for abdominal pain and diarrhea.  Genitourinary: Negative.   Musculoskeletal: Negative.   Skin: Negative.   Allergic/Immunologic: Negative.   Neurological: Negative.   Hematological: Negative.   Psychiatric/Behavioral: Negative.        Past Medical History:  Diagnosis Date  . Adenocarcinoma of colon ([place]) 2010   Stage IIIB (T3, N2a, M0), colectomy and chemotherapy with FOLFOX  . Chronic atrial fibrillation ([place])   . CKD (chronic kidney disease) stage 2, GFR 60-89 ml/min   . Essential hypertension   . Mixed hyperlipidemia   . Neuropathy ([place])   . Port catheter in place   . 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 Thoracic aortic aneurysm ([place])   . Type 2 diabetes mellitus ([place])     Past Surgical History:  Procedure Laterality Date  . COLECTOMY    . COLONOSCOPY N/A 02/17/2015   Procedure: COLONOSCOPY;  Surgeon: Danie Binder, MD;  [facility];  Service: Endoscopy;  Laterality: N/A;  1400 via colostomy - moved to 2:45 - office to notify  . NEPHRECTOMY      Family History  Problem Relation Age of Onset  . Breast cancer Mother   . Colon cancer Maternal Grandfather   . Colon cancer Brother     Current Outpatient Prescriptions on File Prior to Visit  Medication Sig Dispense Refill  . dicyclomine (BENTYL) 10 MG capsule 1 PO 30 MINUTES PRIOR TO BREAKFAST AND IF NEEDED 30 MINS PRIOR TO LUNCH 60 capsule 11  . diltiazem (CARDIZEM) 60 MG tablet Take 60 mg by mouth 2 (two) times daily.     . DULoxetine (CYMBALTA) 30 MG capsule Take 2 capsules by mouth 2 (two) times daily.     . ferrous sulfate 325 (65 FE) MG tablet Take 325 mg by mouth daily with breakfast.    . fluticasone (FLONASE) 50 MCG/ACT nasal spray Place 2 sprays into both nostrils at bedtime. (Patient not taking: Reported on 11/13/2016) 48 g 3  . gabapentin (NEURONTIN) 300 MG capsule Take 300 mg by mouth daily.    Marland Kitchen HYDROcodone-acetaminophen (NORCO) 10-325 MG tablet Take 1-2 tablets by mouth 4 (four) times daily as needed for moderate pain.   0  . metFORMIN (GLUCOPHAGE) 1000 MG tablet Take 500 mg by mouth 2 (two) times daily with a meal.     .  Multiple Vitamins-Minerals (COMPLETE MULTIVITAMIN/MINERAL PO) Take 1 tablet by mouth daily.    . ondansetron (ZOFRAN) 4 MG tablet Take 1 tablet (4 mg total) by mouth every 6 (six) hours. (Patient not taking: Reported on 11/13/2016) 12 tablet 0  . pioglitazone (ACTOS) 45 MG tablet Take 45 mg by mouth daily.      . ranitidine (ZANTAC) 150 MG tablet Take 1 tablet (150 mg total) by mouth 2 (two) times daily. (Patient not taking: Reported on 11/13/2016) 180 tablet 3  . simvastatin (ZOCOR) 80 MG tablet Take 40 mg by mouth every other day. Reported on 01/08/2016    . vitamin B-12 (CYANOCOBALAMIN) 1000 MCG tablet Take 2,000 mcg by mouth daily. Reported on 04/16/2016    . Vitamins-Lipotropics (EAR HEALTH PLUS PO) Take  1 tablet by mouth daily.    Marland Kitchen warfarin (COUMADIN) 5 MG tablet Take 5 mg by mouth daily. 5 mg daily (5 days/week)     Current Facility-Administered Medications on File Prior to Visit  Medication Dose Route Frequency Provider Last Rate Last Dose  . sodium chloride 0.9 % injection 10 mL  10 mL Intravenous PRN Patrici Ranks, MD   10 mL at 02/21/15 1415  . sodium chloride flush (NS) 0.9 % injection 10 mL  10 mL Intravenous PRN Baird Cancer, PA-C   10 mL at 10/31/16 1342    No Known Allergies  History  Alcohol Use  . 0.0 oz/week    Comment: Occasional gin and tonic    History  Smoking Status  . Former Smoker  . Types: Cigarettes  Smokeless Tobacco  . Never Used    Vitals:   11/26/16 1523  BP: (!) 164/86  Pulse: 69  Temp: (!) 96.2 F (35.7 C)   Objective:   Physical Exam  Constitutional: He is oriented to person, place, and time. He appears well-developed and well-nourished. No distress.  HENT:  Head: Normocephalic and atraumatic.  Neck: Normal range of motion. Neck supple.  Cardiovascular: Normal rate, regular rhythm and normal heart sounds.   Pulmonary/Chest: Effort normal and breath sounds normal.  Abdominal: Soft. Bowel sounds are normal. He exhibits no distension. There is no tenderness.  Ostomy present in the right upper abdomen. Ostomy is pink and patent. He does have some granulomatous tissue along the circumferential edge. It is not bleeding. It is not friable. It is somewhat thickened and palpating it.  Neurological: He is alert and oriented to person, place, and time.  Skin: Skin is dry.  Vitals reviewed.  colonoscopy report reviewed from 2016     Assessment:     Irregular mucosa at ostomy site, does not appear grossly to be recurrent colon cancer. Appears more to be chronic granulomatous changes of the mucosa.    Plan:     I did offer mucosal revision of the ostomy site, the patient states that this has been present for some time, it was more  concerned about his ostomy output being loose. We did discuss ways of firming up his bowels including increasing the fiber in his diet. He would like to just monitor it for now and will return to my office in several months for me to look at the ostomy site again. He was instructed to return sooner should bleeding or other issues develop with the ostomy.

## 2016-11-27 ENCOUNTER — Ambulatory Visit: Payer: Medicare Other | Admitting: Gastroenterology

## 2016-12-25 ENCOUNTER — Encounter (HOSPITAL_COMMUNITY): Payer: Medicare Other | Attending: Oncology

## 2016-12-25 ENCOUNTER — Encounter (HOSPITAL_COMMUNITY): Payer: Self-pay

## 2016-12-25 DIAGNOSIS — C189 Malignant neoplasm of colon, unspecified: Secondary | ICD-10-CM | POA: Insufficient documentation

## 2016-12-25 DIAGNOSIS — Z85038 Personal history of other malignant neoplasm of large intestine: Secondary | ICD-10-CM | POA: Diagnosis not present

## 2016-12-25 DIAGNOSIS — Z452 Encounter for adjustment and management of vascular access device: Secondary | ICD-10-CM | POA: Diagnosis present

## 2016-12-25 DIAGNOSIS — Z85528 Personal history of other malignant neoplasm of kidney: Secondary | ICD-10-CM | POA: Diagnosis not present

## 2016-12-25 DIAGNOSIS — Z95828 Presence of other vascular implants and grafts: Secondary | ICD-10-CM | POA: Insufficient documentation

## 2016-12-25 MED ORDER — HEPARIN SOD (PORK) LOCK FLUSH 100 UNIT/ML IV SOLN
INTRAVENOUS | Status: AC
  Filled 2016-12-25: qty 5

## 2016-12-25 MED ORDER — SODIUM CHLORIDE 0.9% FLUSH
20.0000 mL | INTRAVENOUS | Status: DC | PRN
  Administered 2016-12-25: 20 mL via INTRAVENOUS
  Filled 2016-12-25: qty 20

## 2016-12-25 MED ORDER — HEPARIN SOD (PORK) LOCK FLUSH 100 UNIT/ML IV SOLN
500.0000 [IU] | Freq: Once | INTRAVENOUS | Status: AC
  Administered 2016-12-25: 500 [IU] via INTRAVENOUS

## 2016-12-25 NOTE — Progress Notes (Signed)
Marcellina Millin presented for Portacath access and flush. Proper placement of portacath confirmed by CXR. Portacath located left chest wall accessed with  H 20 needle. Good blood return present. Portacath flushed with 59ml NS and 500U/93ml Heparin and needle removed intact. Procedure without incident. Patient tolerated procedure well. Patient c/o stomach cramping and loose stools in his ostomy as well as "yellow mucus discharge and white fuzz" in his stool.  He is to see his PCP in April.  Robynn Pane, PA as well as Dr, Talbert Cage notified of changes and state that he can take immodium for the diarrhea control.  No further instruction.

## 2016-12-25 NOTE — Patient Instructions (Signed)
[facility] at [facility] Discharge Instructions  RECOMMENDATIONS MADE BY THE CONSULTANT AND ANY TEST RESULTS WILL BE SENT TO YOUR REFERRING PHYSICIAN.  Port flush.    Thank you for choosing  Cancer Center at [facility] to provide your oncology and hematology care.  To afford each patient quality time with our provider, please arrive at least 15 minutes before your scheduled appointment time.    If you have a lab appointment with the Cancer Center please come in thru the  Main Entrance and check in at the main information desk  You need to re-schedule your appointment should you arrive 10 or more minutes late.  We strive to give you quality time with our providers, and arriving late affects you and other patients whose appointments are after yours.  Also, if you no show three or more times for appointments you may be dismissed from the clinic at the providers discretion.     Again, thank you for choosing [facility].  Our hope is that these requests will decrease the amount of time that you wait before being seen by our physicians.       _____________________________________________________________  Should you have questions after your visit to [facility], please contact our office at [phone] between the hours of 8:30 a.m. and 4:30 p.m.  Voicemails left after 4:30 p.m. will not be returned until the following business day.  For prescription refill requests, have your pharmacy contact our office.       Resources For Cancer Patients and their Caregivers ? American Cancer Society: Can assist with transportation, wigs, general needs, runs Look Good Feel Better.        1-888-227-6333 ? Cancer Care: Provides financial assistance, online support groups, medication/co-pay assistance.  1-800-813-HOPE (4673) ? [facility] Assists [place] cancer patients and their families through emotional ,  educational and financial support.  336-427-4357 ? [place] DSS Where to apply for food stamps, Medicaid and utility assistance. 336-342-1394 ? [facility]: Transportation to medical appointments. 336-347-2287 ? Social Security Administration: May apply for disability if have a Stage IV cancer. 336-342-7796 1-800-772-1213 ? [place] Aging, Disability and Transit Services: Assists with nutrition, care and transit needs. 336-349-2343  Cancer Center Support Programs: [email or web address]@ > Cancer Support Group  2nd Tuesday of the month 1pm-2pm, Journey Room  > Creative Journey  3rd Tuesday of the month 1130am-1pm, Journey Room  > Look Good Feel Better  1st Wednesday of the month 10am-12 noon, Journey Room (Call American Cancer Society to register 1-800-395-5775)    

## 2016-12-26 ENCOUNTER — Encounter (HOSPITAL_COMMUNITY): Payer: Medicare Other

## 2017-01-28 ENCOUNTER — Encounter: Payer: Self-pay | Admitting: Gastroenterology

## 2017-02-17 ENCOUNTER — Ambulatory Visit: Payer: Medicare Other | Admitting: Nurse Practitioner

## 2017-02-17 NOTE — Progress Notes (Deleted)
Referring Provider: Lemmie Evens, MD Primary Care Physician:  Lemmie Evens, MD Primary GI:  Dr. Oneida Alar  No chief complaint on file.   HPI:   [name] is a 71 y.o. male who presents on referral from primary care for diarrhea. The patient was last seen in our office 11/13/2016 for abdominal pain and bleeding from colostomy. At that time it was noted he was passing blood from his colostomy and did not follow up with the emergency department as recommended by primary care. He took pills to "make his stomach calmed down" which helped. However, things had recently not been normal with loose stools despite narcotic pain medications, stools noted yellow and mucousy discharge. Occasional vomiting without blood. Exam of ostomy site appeared macerated and friable, brown stool at the ostomy site.  Overall abdominal pain improved but not ideally controlled and associated with loose stools." Clear etiology, possible viral gastroenteritis or medications (Glucophage). Less likely recurrent colon cancer/peritoneal carcinomatosis. Recommended low dairy diet, add lactase pills, dicyclomine. He will follow up with surgeon for evaluation. Recommended call in 6 weeks if no improvement at which point a CT scan will be completed. Follow-up in 4 months.  He followed up with the surgeon 11/26/2016 at which point abdominal pain and resolved and output more well formed. Noted history of IBS. Referred for consideration of biopsying the mucosa at the ostomy site.  Today he states   Past Medical History:  Diagnosis Date  . Adenocarcinoma of colon ([place]) 2010   Stage IIIB (T3, N2a, M0), colectomy and chemotherapy with FOLFOX  . Chronic atrial fibrillation ([place])   . CKD (chronic kidney disease) stage 2, GFR 60-89 ml/min   . Essential hypertension   . Mixed hyperlipidemia   . Neuropathy ([place])   . Port catheter in place   . 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  Thoracic aortic aneurysm ([place])   . Type 2 diabetes mellitus ([place])     Past Surgical History:  Procedure Laterality Date  . COLECTOMY    . COLONOSCOPY N/A 02/17/2015   Procedure: COLONOSCOPY;  Surgeon: Danie Binder, MD;  [facility];  Service: Endoscopy;  Laterality: N/A;  1400 via colostomy - moved to 2:45 - office to notify  . NEPHRECTOMY      Current Outpatient Prescriptions  Medication Sig Dispense Refill  . dicyclomine (BENTYL) 10 MG capsule 1 PO 30 MINUTES PRIOR TO BREAKFAST AND IF NEEDED 30 MINS PRIOR TO LUNCH 60 capsule 11  . diltiazem (CARDIZEM) 60 MG tablet Take 60 mg by mouth 2 (two) times daily.     . DULoxetine (CYMBALTA) 30 MG capsule Take 2 capsules by mouth 2 (two) times daily.     . ferrous sulfate 325 (65 FE) MG tablet Take 325 mg by mouth daily with breakfast.    . fluticasone (FLONASE) 50 MCG/ACT nasal spray Place 2 sprays into both nostrils at bedtime. 48 g 3  . gabapentin (NEURONTIN) 300 MG capsule Take 300 mg by mouth daily.    Marland Kitchen HYDROcodone-acetaminophen (NORCO) 10-325 MG tablet Take 1-2 tablets by mouth 4 (four) times daily as needed for moderate pain.   0  . metFORMIN (GLUCOPHAGE) 1000 MG tablet Take 500 mg by mouth 2 (two) times daily with a meal.     . Multiple Vitamins-Minerals (COMPLETE MULTIVITAMIN/MINERAL PO) Take 1 tablet by mouth daily.    Marland Kitchen omeprazole (PRILOSEC) 20 MG capsule Take 20 mg by  mouth 2 (two) times daily at 8 am and 10 pm.    . ondansetron (ZOFRAN) 4 MG tablet Take 1 tablet (4 mg total) by mouth every 6 (six) hours. 12 tablet 0  . pioglitazone (ACTOS) 45 MG tablet Take 45 mg by mouth daily.      . ranitidine (ZANTAC) 150 MG tablet Take 1 tablet (150 mg total) by mouth 2 (two) times daily. 180 tablet 3  . simvastatin (ZOCOR) 80 MG tablet Take 40 mg by mouth every other day. Reported on 01/08/2016    . vitamin B-12 (CYANOCOBALAMIN) 1000 MCG tablet Take 2,000 mcg by mouth daily. Reported on 04/16/2016    . Vitamins-Lipotropics (EAR  HEALTH PLUS PO) Take 1 tablet by mouth daily.    Marland Kitchen warfarin (COUMADIN) 5 MG tablet Take 5 mg by mouth daily. 5 mg daily (5 days/week)     No current facility-administered medications for this visit.    Facility-Administered Medications Ordered in Other Visits  Medication Dose Route Frequency Provider Last Rate Last Dose  . sodium chloride 0.9 % injection 10 mL  10 mL Intravenous PRN Penland, Kelby Fam, MD   10 mL at 02/21/15 1415  . sodium chloride flush (NS) 0.9 % injection 10 mL  10 mL Intravenous PRN Baird Cancer, PA-C   10 mL at 10/31/16 1342    Allergies as of 02/17/2017  . (No Known Allergies)    Family History  Problem Relation Age of Onset  . Breast cancer Mother   . Colon cancer Maternal Grandfather   . Colon cancer Brother     Social History   Social History  . Marital status: Married    Spouse name: N/A  . Number of children: N/A  . Years of education: N/A   Social History Main Topics  . Smoking status: Former Smoker    Types: Cigarettes  . Smokeless tobacco: Never Used  . Alcohol use 0.0 oz/week     Comment: Occasional gin and tonic  . Drug use: No  . Sexual activity: Not on file   Other Topics Concern  . Not on file   Social History Narrative  . No narrative on file    Review of Systems: General: Negative for anorexia, weight loss, fever, chills, fatigue, weakness. Eyes: Negative for vision changes.  ENT: Negative for hoarseness, difficulty swallowing , nasal congestion. CV: Negative for chest pain, angina, palpitations, dyspnea on exertion, peripheral edema.  Respiratory: Negative for dyspnea at rest, dyspnea on exertion, cough, sputum, wheezing.  GI: See history of present illness. GU:  Negative for dysuria, hematuria, urinary incontinence, urinary frequency, nocturnal urination.  MS: Negative for joint pain, low back pain.  Derm: Negative for rash or itching.  Neuro: Negative for weakness, abnormal sensation, seizure, frequent headaches,  memory loss, confusion.  Psych: Negative for anxiety, depression, suicidal ideation, hallucinations.  Endo: Negative for unusual weight change.  Heme: Negative for bruising or bleeding. Allergy: Negative for rash or hives.   Physical Exam: There were no vitals taken for this visit. General:   Alert and oriented. Pleasant and cooperative. Well-nourished and well-developed.  Head:  Normocephalic and atraumatic. Eyes:  Without icterus, sclera clear and conjunctiva pink.  Ears:  Normal auditory acuity. Mouth:  No deformity or lesions, oral mucosa pink.  Throat/Neck:  Supple, without mass or thyromegaly. Cardiovascular:  S1, S2 present without murmurs appreciated. Normal pulses noted. Extremities without clubbing or edema. Respiratory:  Clear to auscultation bilaterally. No wheezes, rales, or rhonchi. No distress.  Gastrointestinal:  +BS, soft, non-tender and non-distended. No HSM noted. No guarding or rebound. No masses appreciated.  Rectal:  Deferred  Musculoskalatal:  Symmetrical without gross deformities. Normal posture. Skin:  Intact without significant lesions or rashes. Neurologic:  Alert and oriented x4;  grossly normal neurologically. Psych:  Alert and cooperative. Normal mood and affect. Heme/Lymph/Immune: No significant cervical adenopathy. No excessive bruising noted.    02/17/2017 8:25 AM   Disclaimer: This note was dictated with voice recognition software. Similar sounding words can inadvertently be transcribed and may not be corrected upon review.

## 2017-02-20 ENCOUNTER — Encounter (HOSPITAL_COMMUNITY): Payer: Medicare Other

## 2017-02-21 ENCOUNTER — Encounter (HOSPITAL_COMMUNITY): Payer: Medicare Other

## 2017-02-28 ENCOUNTER — Encounter (HOSPITAL_COMMUNITY): Payer: Self-pay

## 2017-02-28 ENCOUNTER — Encounter (HOSPITAL_COMMUNITY): Payer: Medicare Other | Attending: Oncology

## 2017-02-28 VITALS — BP 150/86 | HR 102 | Temp 98.6°F | Resp 20

## 2017-02-28 DIAGNOSIS — C189 Malignant neoplasm of colon, unspecified: Secondary | ICD-10-CM | POA: Insufficient documentation

## 2017-02-28 DIAGNOSIS — Z452 Encounter for adjustment and management of vascular access device: Secondary | ICD-10-CM | POA: Diagnosis present

## 2017-02-28 DIAGNOSIS — Z85528 Personal history of other malignant neoplasm of kidney: Secondary | ICD-10-CM | POA: Diagnosis not present

## 2017-02-28 DIAGNOSIS — Z85038 Personal history of other malignant neoplasm of large intestine: Secondary | ICD-10-CM

## 2017-02-28 DIAGNOSIS — Z95828 Presence of other vascular implants and grafts: Secondary | ICD-10-CM | POA: Insufficient documentation

## 2017-02-28 MED ORDER — HEPARIN SOD (PORK) LOCK FLUSH 100 UNIT/ML IV SOLN
INTRAVENOUS | Status: AC
  Filled 2017-02-28: qty 5

## 2017-02-28 MED ORDER — SODIUM CHLORIDE 0.9% FLUSH
10.0000 mL | INTRAVENOUS | Status: DC | PRN
  Administered 2017-02-28: 10 mL via INTRAVENOUS
  Filled 2017-02-28: qty 10

## 2017-02-28 MED ORDER — HEPARIN SOD (PORK) LOCK FLUSH 100 UNIT/ML IV SOLN
500.0000 [IU] | Freq: Once | INTRAVENOUS | Status: AC
  Administered 2017-02-28: 500 [IU] via INTRAVENOUS

## 2017-02-28 NOTE — Progress Notes (Signed)
[name] tolerated port flush well without complaints or incident. Port accessed with blood return noted then flushed with 10 ml NS and 5 ml Heparin easily per protocol then de-accessed.VSS Pt discharged self ambulatory in satisfactory condition

## 2017-02-28 NOTE — Patient Instructions (Signed)
[facility] at [facility] Discharge Instructions  RECOMMENDATIONS MADE BY THE CONSULTANT AND ANY TEST RESULTS WILL BE SENT TO YOUR REFERRING PHYSICIAN.  Portacath flushed per protocol today. Follow-up as scheduled. Call clinic for any questions or concerns  Thank you for choosing [facility] at [facility] to provide your oncology and hematology care.  To afford each patient quality time with our provider, please arrive at least 15 minutes before your scheduled appointment time.    If you have a lab appointment with the Cancer Center please come in thru the  Main Entrance and check in at the main information desk  You need to re-schedule your appointment should you arrive 10 or more minutes late.  We strive to give you quality time with our providers, and arriving late affects you and other patients whose appointments are after yours.  Also, if you no show three or more times for appointments you may be dismissed from the clinic at the providers discretion.     Again, thank you for choosing [facility].  Our hope is that these requests will decrease the amount of time that you wait before being seen by our physicians.       _____________________________________________________________  Should you have questions after your visit to [facility], please contact our office at [phone] between the hours of 8:30 a.m. and 4:30 p.m.  Voicemails left after 4:30 p.m. will not be returned until the following business day.  For prescription refill requests, have your pharmacy contact our office.       Resources For Cancer Patients and their Caregivers ? American Cancer Society: Can assist with transportation, wigs, general needs, runs Look Good Feel Better.        1-888-227-6333 ? Cancer Care: Provides financial assistance, online support groups, medication/co-pay assistance.  1-800-813-HOPE (4673) ? [facility] Assists [place] cancer patients and their families through emotional , educational and financial support.  336-427-4357 ? [place] DSS Where to apply for food stamps, Medicaid and utility assistance. 336-342-1394 ? [facility]: Transportation to medical appointments. 336-347-2287 ? Social Security Administration: May apply for disability if have a Stage IV cancer. 336-342-7796 1-800-772-1213 ? [place] Aging, Disability and Transit Services: Assists with nutrition, care and transit needs. 336-349-2343  Cancer Center Support Programs: @[email or web address]@ > Cancer Support Group  2nd Tuesday of the month 1pm-2pm, Journey Room  > Creative Journey  3rd Tuesday of the month 1130am-1pm, Journey Room  > Look Good Feel Better  1st Wednesday of the month 10am-12 noon, Journey Room (Call American Cancer Society to register 1-800-395-5775)   

## 2017-03-03 ENCOUNTER — Ambulatory Visit: Payer: Medicare Other | Admitting: Nurse Practitioner

## 2017-03-12 ENCOUNTER — Ambulatory Visit (INDEPENDENT_AMBULATORY_CARE_PROVIDER_SITE_OTHER): Payer: Medicare Other | Admitting: Nurse Practitioner

## 2017-03-12 ENCOUNTER — Encounter: Payer: Self-pay | Admitting: Nurse Practitioner

## 2017-03-12 VITALS — BP 133/82 | HR 90 | Temp 98.2°F | Ht 74.0 in | Wt 304.0 lb

## 2017-03-12 DIAGNOSIS — R109 Unspecified abdominal pain: Secondary | ICD-10-CM | POA: Diagnosis not present

## 2017-03-12 DIAGNOSIS — R197 Diarrhea, unspecified: Secondary | ICD-10-CM | POA: Diagnosis not present

## 2017-03-12 MED ORDER — DICYCLOMINE HCL 10 MG PO CAPS: 10.0000 mg | ORAL_CAPSULE | Freq: Three times a day (TID) | ORAL | 1 refills | Status: DC

## 2017-03-12 NOTE — Assessment & Plan Note (Signed)
Abdominal pain improved/essentially resolved. Continue to monitor for abdominal pain. Return for follow-up in 2 months.

## 2017-03-12 NOTE — Assessment & Plan Note (Signed)
He continues with diarrhea though it is improved. Fiber does not make a difference. He did have diarrhea start worsening when he had abdominal pain, nausea, vomiting a couple months ago. Possible viral gastroenteritis complicating things. At this point I'll start him on a probiotic for the next 2 months. I will also send in Bentyl 10 mg up to 4 times a day as needed for diarrhea. He is to call us for any significant or severe/worsening symptoms. Call if no improvement. CT scan still remains a possibility depending on his clinical progression, although he is somewhat improved today. Return for follow-up in 2 months.

## 2017-03-12 NOTE — Progress Notes (Signed)
cc'ed to pcp °

## 2017-03-12 NOTE — Progress Notes (Signed)
Referring Provider: Lemmie Evens, MD Primary Care Physician:  Lemmie Evens, MD Primary GI:  Dr. Oneida Alar  Chief Complaint  Patient presents with  . Diarrhea    HPI:   [name] is a 71 y.o. male who presents on referral from primary care due to diarrhea. He was last seen in our office 11/13/2016 at which point he noted abdominal cramping for which she was in the ER, bleeding from colostomy, loose stools despite Norco pain medicine. Feels things are "not normal" on iron daily. Abdominal cramping and dry heaves have resolved at his last visit. Noted mildly tender ostomy when examined with the gloved finger, appears macerated and friable with soft, brown stool at the ostomy site. Overall felt to be clinically improved but not ideally controlled symptoms and etiology unclear, possibly acute viral gastroenteritis or medications such as Glucophage. Less likely recurrent colon cancer/peritoneal carcinomatosis. Recommended low dairy diet, add lactase pills if he eats dairy, dicyclomine up to twice a day. He was referred to surgeon for further evaluation. Recommended call in 6 weeks if symptoms not improved and if abdominal pain is not improved he will need a CT scan. Follow-up in 4 months.  At his surgeon evaluation it was noted irregular mucosa at the ostomy site does not appear to be recurrent colon cancer but more granulomatosis changes of the mucosa. He was offered a mucosal revision but the patient declined at that time. Further recommendations to help firm up his bowels even more were discussed. The patient elected to monitor his symptoms and follow-up in several months with the surgeon. Recommended return sooner if bleeding or other issues develop.  Today he states he's doing as well as can be expected. His diarrhea started with the abdominal cramping a couple months before his last OV with [place], was seen in the ER and given Bentyl with no further camping. Shortly after that he had "real  bad diarrhea" and vomiting. He subsequently wasn't taking any of his medications. He has stopped taking a number of his Rx medications in the ensuing months. Had diarrhea after colectomy and pain medication for neuropathy (Norco) helped diarrhea for the time. Today his diarrhea has improved: sometimes semi-solid/pasty bowel movements, normal bowel movements, and some still liquid (estimates 3/10 liquid stools, 5-6/10 pasty). He is no longer on Bentyl. Has some chronic bleeding from ostomy, but not much. His abdominal pain is improved. Denies N/V, fever, chills, unintentional weight loss.   Past Medical History:  Diagnosis Date  . Adenocarcinoma of colon ([place]) 2010   Stage IIIB (T3, N2a, M0), colectomy and chemotherapy with FOLFOX  . Chronic atrial fibrillation ([place])   . CKD (chronic kidney disease) stage 2, GFR 60-89 ml/min   . Essential hypertension   . Mixed hyperlipidemia   . Neuropathy   . Port catheter in place   . 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 Thoracic aortic aneurysm ([place])   . Type 2 diabetes mellitus ([place])     Past Surgical History:  Procedure Laterality Date  . COLECTOMY    . COLONOSCOPY N/A 02/17/2015   Procedure: COLONOSCOPY;  Surgeon: Danie Binder, MD;  Location: [facility];  Service: Endoscopy;  Laterality: N/A;  1400 via colostomy - moved to 2:45 - office to notify  . NEPHRECTOMY      Current Outpatient Prescriptions  Medication Sig Dispense Refill  . diltiazem (CARDIZEM) 60 MG tablet Take 60 mg by mouth 2 (  two) times daily.     . ferrous sulfate 325 (65 FE) MG tablet Take 325 mg by mouth daily with breakfast.    . HYDROcodone-acetaminophen (NORCO) 10-325 MG tablet Take 1-2 tablets by mouth 4 (four) times daily as needed for moderate pain.   0  . metFORMIN (GLUCOPHAGE) 1000 MG tablet Take 500 mg by mouth 2 (two) times daily with a meal.     . Multiple Vitamins-Minerals (COMPLETE MULTIVITAMIN/MINERAL PO) Take 1 tablet by  mouth daily.    . pioglitazone (ACTOS) 45 MG tablet Take 45 mg by mouth daily.      . simvastatin (ZOCOR) 80 MG tablet Take 40 mg by mouth every other day. Reported on 01/08/2016    . warfarin (COUMADIN) 5 MG tablet Take 5 mg by mouth daily. 5 mg daily (5 days/week)    . dicyclomine (BENTYL) 10 MG capsule Take 1 capsule (10 mg total) by mouth 4 (four) times daily -  before meals and at bedtime. As needed for diarrhea. 120 capsule 1   No current facility-administered medications for this visit.    Facility-Administered Medications Ordered in Other Visits  Medication Dose Route Frequency Provider Last Rate Last Dose  . sodium chloride 0.9 % injection 10 mL  10 mL Intravenous PRN Penland, Kelby Fam, MD   10 mL at 02/21/15 1415  . sodium chloride flush (NS) 0.9 % injection 10 mL  10 mL Intravenous PRN Baird Cancer, PA-C   10 mL at 10/31/16 1342    Allergies as of 03/12/2017  . (No Known Allergies)    Family History  Problem Relation Age of Onset  . Breast cancer Mother   . Colon cancer Maternal Grandfather   . Colon cancer Brother     Social History   Social History  . Marital status: Married    Spouse name: N/A  . Number of children: N/A  . Years of education: N/A   Social History Main Topics  . Smoking status: Former Smoker    Types: Cigarettes  . Smokeless tobacco: Never Used  . Alcohol use 0.0 oz/week     Comment: Occasional gin and tonic  . Drug use: No  . Sexual activity: Not Asked   Other Topics Concern  . None   Social History Narrative  . None    Review of Systems: Complete ROS negative except as per HPI.   Physical Exam: BP 133/82   Pulse 90   Temp 98.2 F (36.8 C) (Oral)   Ht 6\' 2"  (1.88 m)   Wt (!) 304 lb (137.9 kg)   BMI 39.03 kg/m  General:   Morbidly obese male. Alert and oriented. Pleasant and cooperative. Well-nourished and well-developed.  Head:  Normocephalic and atraumatic. Eyes:  Without icterus, sclera clear and conjunctiva pink.    Ears:  Normal auditory acuity. Cardiovascular:  S1, S2 present without murmurs appreciated. Extremities without clubbing or edema. Respiratory:  Clear to auscultation bilaterally. No wheezes, rales, or rhonchi. No distress.  Gastrointestinal:  +BS, obese but soft, non-tender and non-distended. Ostomy bag noted RLQ, empty at this time no obvious blood noted. No HSM noted. No guarding or rebound. No masses appreciated.  Rectal:  Deferred  Musculoskalatal:  Symmetrical without gross deformities. Neurologic:  Alert and oriented x4;  grossly normal neurologically. Psych:  Alert and cooperative. Normal mood and affect. Heme/Lymph/Immune: No excessive bruising noted.    03/12/2017 11:46 AM   Disclaimer: This note was dictated with voice recognition software. Similar sounding words can  inadvertently be transcribed and may not be corrected upon review.

## 2017-03-12 NOTE — Patient Instructions (Signed)
1. Take a probiotic for the next 60 days. I will provide you with samples and coupons to help with the cost. 2. I am giving you a prescription for Bentyl 10 mg. Take this up to 4 times a day as needed for diarrhea. 3. Call us if you have any severe or worsening symptoms. 4. Return for follow-up in 2 months.

## 2017-03-26 ENCOUNTER — Encounter: Payer: Self-pay | Admitting: Gastroenterology

## 2017-04-17 ENCOUNTER — Encounter (HOSPITAL_COMMUNITY): Payer: Self-pay

## 2017-04-17 ENCOUNTER — Encounter (HOSPITAL_COMMUNITY): Payer: Medicare Other | Attending: Oncology

## 2017-04-17 VITALS — BP 128/78 | HR 94 | Temp 98.0°F | Resp 20

## 2017-04-17 DIAGNOSIS — Z85038 Personal history of other malignant neoplasm of large intestine: Secondary | ICD-10-CM | POA: Diagnosis not present

## 2017-04-17 DIAGNOSIS — Z85528 Personal history of other malignant neoplasm of kidney: Secondary | ICD-10-CM

## 2017-04-17 DIAGNOSIS — Z452 Encounter for adjustment and management of vascular access device: Secondary | ICD-10-CM | POA: Diagnosis present

## 2017-04-17 DIAGNOSIS — Z95828 Presence of other vascular implants and grafts: Secondary | ICD-10-CM | POA: Insufficient documentation

## 2017-04-17 DIAGNOSIS — C189 Malignant neoplasm of colon, unspecified: Secondary | ICD-10-CM | POA: Insufficient documentation

## 2017-04-17 MED ORDER — HEPARIN SOD (PORK) LOCK FLUSH 100 UNIT/ML IV SOLN
INTRAVENOUS | Status: AC
  Filled 2017-04-17: qty 5

## 2017-04-17 MED ORDER — HEPARIN SOD (PORK) LOCK FLUSH 100 UNIT/ML IV SOLN
500.0000 [IU] | Freq: Once | INTRAVENOUS | Status: AC
  Administered 2017-04-17: 500 [IU] via INTRAVENOUS

## 2017-04-17 MED ORDER — SODIUM CHLORIDE 0.9% FLUSH
10.0000 mL | INTRAVENOUS | Status: DC | PRN
  Administered 2017-04-17: 10 mL via INTRAVENOUS
  Filled 2017-04-17: qty 10

## 2017-04-17 NOTE — Patient Instructions (Signed)
[facility] at [facility] Discharge Instructions  RECOMMENDATIONS MADE BY THE CONSULTANT AND ANY TEST RESULTS WILL BE SENT TO YOUR REFERRING PHYSICIAN.  Portacath flushed per protocol today. Follow-up as scheduled. Call clinic for any questions or concerns  Thank you for choosing [facility] at [facility] to provide your oncology and hematology care.  To afford each patient quality time with our provider, please arrive at least 15 minutes before your scheduled appointment time.    If you have a lab appointment with the Cancer Center please come in thru the  Main Entrance and check in at the main information desk  You need to re-schedule your appointment should you arrive 10 or more minutes late.  We strive to give you quality time with our providers, and arriving late affects you and other patients whose appointments are after yours.  Also, if you no show three or more times for appointments you may be dismissed from the clinic at the providers discretion.     Again, thank you for choosing [facility].  Our hope is that these requests will decrease the amount of time that you wait before being seen by our physicians.       _____________________________________________________________  Should you have questions after your visit to [facility], please contact our office at [phone] between the hours of 8:30 a.m. and 4:30 p.m.  Voicemails left after 4:30 p.m. will not be returned until the following business day.  For prescription refill requests, have your pharmacy contact our office.       Resources For Cancer Patients and their Caregivers ? American Cancer Society: Can assist with transportation, wigs, general needs, runs Look Good Feel Better.        1-888-227-6333 ? Cancer Care: Provides financial assistance, online support groups, medication/co-pay assistance.  1-800-813-HOPE (4673) ? [facility] Assists [place] cancer patients and their families through emotional , educational and financial support.  336-427-4357 ? [place] DSS Where to apply for food stamps, Medicaid and utility assistance. 336-342-1394 ? [facility]: Transportation to medical appointments. 336-347-2287 ? Social Security Administration: May apply for disability if have a Stage IV cancer. 336-342-7796 1-800-772-1213 ? [place] Aging, Disability and Transit Services: Assists with nutrition, care and transit needs. 336-349-2343  Cancer Center Support Programs: [email or web address]@ > Cancer Support Group  2nd Tuesday of the month 1pm-2pm, Journey Room  > Creative Journey  3rd Tuesday of the month 1130am-1pm, Journey Room  > Look Good Feel Better  1st Wednesday of the month 10am-12 noon, Journey Room (Call American Cancer Society to register 1-800-395-5775)   

## 2017-04-17 NOTE — Progress Notes (Signed)
[name] tolerated port flush well without complaints or incident. Port accessed with 20 gauge needle with blood return noted then flushed with 10 ml NS and 5 ml Heparin easily per protocol then de-accessed. VSS Pt discharged self ambulatory in satisfactory condition

## 2017-05-16 NOTE — Progress Notes (Signed)
REVIEWED.  

## 2017-06-09 ENCOUNTER — Encounter (HOSPITAL_COMMUNITY): Payer: Medicare Other | Attending: Oncology

## 2017-06-09 ENCOUNTER — Encounter (HOSPITAL_COMMUNITY): Payer: Self-pay

## 2017-06-09 VITALS — BP 139/91 | HR 87 | Temp 98.7°F | Resp 21

## 2017-06-09 DIAGNOSIS — Z452 Encounter for adjustment and management of vascular access device: Secondary | ICD-10-CM | POA: Diagnosis present

## 2017-06-09 DIAGNOSIS — Z85038 Personal history of other malignant neoplasm of large intestine: Secondary | ICD-10-CM | POA: Diagnosis not present

## 2017-06-09 DIAGNOSIS — Z95828 Presence of other vascular implants and grafts: Secondary | ICD-10-CM

## 2017-06-09 DIAGNOSIS — Z85528 Personal history of other malignant neoplasm of kidney: Secondary | ICD-10-CM | POA: Diagnosis not present

## 2017-06-09 DIAGNOSIS — C189 Malignant neoplasm of colon, unspecified: Secondary | ICD-10-CM | POA: Insufficient documentation

## 2017-06-09 MED ORDER — HEPARIN SOD (PORK) LOCK FLUSH 100 UNIT/ML IV SOLN
500.0000 [IU] | Freq: Once | INTRAVENOUS | Status: AC
  Administered 2017-06-09: 500 [IU] via INTRAVENOUS

## 2017-06-09 MED ORDER — HEPARIN SOD (PORK) LOCK FLUSH 100 UNIT/ML IV SOLN
INTRAVENOUS | Status: AC
  Filled 2017-06-09: qty 5

## 2017-06-09 MED ORDER — SODIUM CHLORIDE 0.9% FLUSH
10.0000 mL | INTRAVENOUS | Status: DC | PRN
  Administered 2017-06-09: 10 mL via INTRAVENOUS
  Filled 2017-06-09: qty 10

## 2017-06-09 NOTE — Patient Instructions (Signed)
[facility] at [facility] Discharge Instructions  RECOMMENDATIONS MADE BY THE CONSULTANT AND ANY TEST RESULTS WILL BE SENT TO YOUR REFERRING PHYSICIAN.  You had your port flushed today Continue to get it flushed every 6-8 weeks Follow up as scheduled.  Thank you for choosing [facility] at [facility] to provide your oncology and hematology care.  To afford each patient quality time with our provider, please arrive at least 15 minutes before your scheduled appointment time.    If you have a lab appointment with the Cancer Center please come in thru the  Main Entrance and check in at the main information desk  You need to re-schedule your appointment should you arrive 10 or more minutes late.  We strive to give you quality time with our providers, and arriving late affects you and other patients whose appointments are after yours.  Also, if you no show three or more times for appointments you may be dismissed from the clinic at the providers discretion.     Again, thank you for choosing [facility].  Our hope is that these requests will decrease the amount of time that you wait before being seen by our physicians.       _____________________________________________________________  Should you have questions after your visit to  Cancer Center, please contact our office at [phone] between the hours of 8:30 a.m. and 4:30 p.m.  Voicemails left after 4:30 p.m. will not be returned until the following business day.  For prescription refill requests, have your pharmacy contact our office.       Resources For Cancer Patients and their Caregivers ? American Cancer Society: Can assist with transportation, wigs, general needs, runs Look Good Feel Better.        1-888-227-6333 ? Cancer Care: Provides financial assistance, online support groups, medication/co-pay assistance.  1-800-813-HOPE (4673) ? [facility] Assists [place] cancer patients and their families through emotional , educational and financial support.  336-427-4357 ? [place] DSS Where to apply for food stamps, Medicaid and utility assistance. 336-342-1394 ? [facility]: Transportation to medical appointments. 336-347-2287 ? Social Security Administration: May apply for disability if have a Stage IV cancer. 336-342-7796 1-800-772-1213 ? [place] Aging, Disability and Transit Services: Assists with nutrition, care and transit needs. 336-349-2343  Cancer Center Support Programs: @[email or web address]@ > Cancer Support Group  2nd Tuesday of the month 1pm-2pm, Journey Room  > Creative Journey  3rd Tuesday of the month 1130am-1pm, Journey Room  > Look Good Feel Better  1st Wednesday of the month 10am-12 noon, Journey Room (Call American Cancer Society to register 1-800-395-5775)    

## 2017-06-09 NOTE — Progress Notes (Signed)
[name] presented for Portacath access and flush. Portacath located left chest wall accessed with  H 20 needle. Good blood return present. Portacath flushed with 15ml NS and 500U/75ml Heparin and needle removed intact. Procedure without incident. Patient tolerated treatment without incidence. Patient discharged ambulatory and in stable condition from clinic. Patient to follow up as scheduled.

## 2017-06-10 ENCOUNTER — Encounter (HOSPITAL_COMMUNITY): Payer: Medicare Other

## 2017-06-12 ENCOUNTER — Encounter (HOSPITAL_COMMUNITY): Payer: Medicare Other

## 2017-08-07 ENCOUNTER — Encounter (HOSPITAL_COMMUNITY): Payer: Medicare Other | Attending: Oncology

## 2017-08-07 ENCOUNTER — Encounter (HOSPITAL_COMMUNITY): Payer: Self-pay

## 2017-08-07 DIAGNOSIS — Z85528 Personal history of other malignant neoplasm of kidney: Secondary | ICD-10-CM | POA: Diagnosis not present

## 2017-08-07 DIAGNOSIS — Z452 Encounter for adjustment and management of vascular access device: Secondary | ICD-10-CM | POA: Diagnosis present

## 2017-08-07 DIAGNOSIS — C189 Malignant neoplasm of colon, unspecified: Secondary | ICD-10-CM | POA: Insufficient documentation

## 2017-08-07 DIAGNOSIS — Z85038 Personal history of other malignant neoplasm of large intestine: Secondary | ICD-10-CM

## 2017-08-07 DIAGNOSIS — Z95828 Presence of other vascular implants and grafts: Secondary | ICD-10-CM | POA: Insufficient documentation

## 2017-08-07 MED ORDER — SODIUM CHLORIDE 0.9% FLUSH
10.0000 mL | INTRAVENOUS | Status: DC | PRN
  Administered 2017-08-07: 10 mL via INTRAVENOUS
  Filled 2017-08-07: qty 10

## 2017-08-07 MED ORDER — HEPARIN SOD (PORK) LOCK FLUSH 100 UNIT/ML IV SOLN
500.0000 [IU] | Freq: Once | INTRAVENOUS | Status: AC
  Administered 2017-08-07: 500 [IU] via INTRAVENOUS

## 2017-08-07 NOTE — Patient Instructions (Signed)
[facility] at [facility] Discharge Instructions  RECOMMENDATIONS MADE BY THE CONSULTANT AND ANY TEST RESULTS WILL BE SENT TO YOUR REFERRING PHYSICIAN.  Portacath flushed per protocol today. Follow-up as scheduled. Call clinic for any questions or concerns  Thank you for choosing [facility] at [facility] to provide your oncology and hematology care.  To afford each patient quality time with our provider, please arrive at least 15 minutes before your scheduled appointment time.    If you have a lab appointment with the Cancer Center please come in thru the  Main Entrance and check in at the main information desk  You need to re-schedule your appointment should you arrive 10 or more minutes late.  We strive to give you quality time with our providers, and arriving late affects you and other patients whose appointments are after yours.  Also, if you no show three or more times for appointments you may be dismissed from the clinic at the providers discretion.     Again, thank you for choosing [facility].  Our hope is that these requests will decrease the amount of time that you wait before being seen by our physicians.       _____________________________________________________________  Should you have questions after your visit to [facility], please contact our office at [phone] between the hours of 8:30 a.m. and 4:30 p.m.  Voicemails left after 4:30 p.m. will not be returned until the following business day.  For prescription refill requests, have your pharmacy contact our office.       Resources For Cancer Patients and their Caregivers ? American Cancer Society: Can assist with transportation, wigs, general needs, runs Look Good Feel Better.        1-888-227-6333 ? Cancer Care: Provides financial assistance, online support groups, medication/co-pay assistance.  1-800-813-HOPE (4673) ? [facility] Assists [place] cancer patients and their families through emotional , educational and financial support.  336-427-4357 ? [place] DSS Where to apply for food stamps, Medicaid and utility assistance. 336-342-1394 ? [facility]: Transportation to medical appointments. 336-347-2287 ? Social Security Administration: May apply for disability if have a Stage IV cancer. 336-342-7796 1-800-772-1213 ? [place] Aging, Disability and Transit Services: Assists with nutrition, care and transit needs. 336-349-2343  Cancer Center Support Programs: [email or web address] > Cancer Support Group  2nd Tuesday of the month 1pm-2pm, Journey Room  > Creative Journey  3rd Tuesday of the month 1130am-1pm, Journey Room  > Look Good Feel Better  1st Wednesday of the month 10am-12 noon, Journey Room (Call American Cancer Society to register 1-800-395-5775)   

## 2017-08-07 NOTE — Progress Notes (Signed)
[name] tolerated portacath flush well without complaints or incident. Port accessed with 20 gauge needle with blood return noted then flushed with 10 ml NS and 5 ml Heparin easily per protocol then de-accessed. VSS Pt discharged self ambulatory in satisfactory condition

## 2017-09-12 ENCOUNTER — Other Ambulatory Visit: Payer: Self-pay

## 2017-09-12 ENCOUNTER — Encounter (HOSPITAL_COMMUNITY): Payer: Self-pay

## 2017-09-12 ENCOUNTER — Ambulatory Visit (HOSPITAL_COMMUNITY): Payer: Medicare Other | Admitting: Adult Health

## 2017-09-12 ENCOUNTER — Encounter (HOSPITAL_BASED_OUTPATIENT_CLINIC_OR_DEPARTMENT_OTHER): Payer: Medicare Other | Admitting: Adult Health

## 2017-09-12 ENCOUNTER — Encounter (HOSPITAL_COMMUNITY): Payer: Self-pay | Admitting: Adult Health

## 2017-09-12 ENCOUNTER — Encounter (HOSPITAL_COMMUNITY): Payer: Medicare Other | Attending: Oncology

## 2017-09-12 VITALS — BP 146/80 | HR 86 | Temp 98.5°F | Resp 20 | Ht 74.0 in | Wt 316.0 lb

## 2017-09-12 DIAGNOSIS — Z85528 Personal history of other malignant neoplasm of kidney: Secondary | ICD-10-CM | POA: Diagnosis not present

## 2017-09-12 DIAGNOSIS — Z7901 Long term (current) use of anticoagulants: Secondary | ICD-10-CM

## 2017-09-12 DIAGNOSIS — C189 Malignant neoplasm of colon, unspecified: Secondary | ICD-10-CM | POA: Insufficient documentation

## 2017-09-12 DIAGNOSIS — Z85038 Personal history of other malignant neoplasm of large intestine: Secondary | ICD-10-CM | POA: Diagnosis present

## 2017-09-12 DIAGNOSIS — Z85828 Personal history of other malignant neoplasm of skin: Secondary | ICD-10-CM | POA: Diagnosis not present

## 2017-09-12 DIAGNOSIS — G629 Polyneuropathy, unspecified: Secondary | ICD-10-CM

## 2017-09-12 DIAGNOSIS — R197 Diarrhea, unspecified: Secondary | ICD-10-CM | POA: Diagnosis not present

## 2017-09-12 DIAGNOSIS — Z95828 Presence of other vascular implants and grafts: Secondary | ICD-10-CM | POA: Diagnosis present

## 2017-09-12 DIAGNOSIS — I4891 Unspecified atrial fibrillation: Secondary | ICD-10-CM

## 2017-09-12 LAB — COMPREHENSIVE METABOLIC PANEL
ALT: 16 U/L — ABNORMAL LOW (ref 17–63)
ANION GAP: 6 (ref 5–15)
AST: 10 U/L — ABNORMAL LOW (ref 15–41)
Albumin: 3.3 g/dL — ABNORMAL LOW (ref 3.5–5.0)
Alkaline Phosphatase: 49 U/L (ref 38–126)
BUN: 18 mg/dL (ref 6–20)
CHLORIDE: 112 mmol/L — AB (ref 101–111)
CO2: 23 mmol/L (ref 22–32)
Calcium: 8.1 mg/dL — ABNORMAL LOW (ref 8.9–10.3)
Creatinine, Ser: 1.18 mg/dL (ref 0.61–1.24)
Glucose, Bld: 88 mg/dL (ref 65–99)
POTASSIUM: 3.9 mmol/L (ref 3.5–5.1)
SODIUM: 141 mmol/L (ref 135–145)
Total Bilirubin: 0.5 mg/dL (ref 0.3–1.2)
Total Protein: 6.1 g/dL — ABNORMAL LOW (ref 6.5–8.1)

## 2017-09-12 LAB — CBC WITH DIFFERENTIAL/PLATELET
Basophils Absolute: 0 10*3/uL (ref 0.0–0.1)
Basophils Relative: 1 %
EOS ABS: 0.1 10*3/uL (ref 0.0–0.7)
Eosinophils Relative: 2 %
HEMATOCRIT: 31.2 % — AB (ref 39.0–52.0)
HEMOGLOBIN: 10 g/dL — AB (ref 13.0–17.0)
LYMPHS ABS: 1.4 10*3/uL (ref 0.7–4.0)
LYMPHS PCT: 23 %
MCH: 29.9 pg (ref 26.0–34.0)
MCHC: 32.1 g/dL (ref 30.0–36.0)
MCV: 93.1 fL (ref 78.0–100.0)
Monocytes Absolute: 0.8 10*3/uL (ref 0.1–1.0)
Monocytes Relative: 13 %
NEUTROS ABS: 3.7 10*3/uL (ref 1.7–7.7)
NEUTROS PCT: 61 %
Platelets: 222 10*3/uL (ref 150–400)
RBC: 3.35 MIL/uL — AB (ref 4.22–5.81)
RDW: 13.7 % (ref 11.5–15.5)
WBC: 6.1 10*3/uL (ref 4.0–10.5)

## 2017-09-12 MED ORDER — SODIUM CHLORIDE 0.9% FLUSH
10.0000 mL | INTRAVENOUS | Status: AC | PRN
  Administered 2017-09-12: 10 mL via INTRAVENOUS
  Filled 2017-09-12: qty 10

## 2017-09-12 MED ORDER — HEPARIN SOD (PORK) LOCK FLUSH 100 UNIT/ML IV SOLN
500.0000 [IU] | Freq: Once | INTRAVENOUS | Status: AC
  Administered 2017-09-12: 500 [IU] via INTRAVENOUS

## 2017-09-12 NOTE — Progress Notes (Signed)
[name], [name] [id number]   CLINIC:  Medical Oncology/Hematology  PCP:  Dwayne Evens, MD [place] [address] [phone]   REASON FOR VISIT:  Follow-up for Stage IIIB adenocarcinoma of colon AND Clear cell carcinoma or (R) kidney  CURRENT THERAPY: Surveillance    BRIEF ONCOLOGIC HISTORY:    Adenocarcinoma of colon ([place])   06/26/2009 Initial Diagnosis    Invasive adenocarcinoma      06/28/2009 Surgery    Invasive adenocarcinoma with two lesions, both extending inot the pericolonic adipose tissue, maximum tumor size was 6.0 cm with 4/32 lymph nodes positive for disease. pT3, pN2, pMx      10/16/2009 - 03/20/2010 Chemotherapy    FOLFOX x 12 cycles      07/03/2015 Imaging    CT CAP- NED       Remission           HISTORY OF PRESENT ILLNESS:  (From Dwayne Crigler, PA-C's last note on 09/05/16)       INTERVAL HISTORY:  [name] 71 y.o. male returns for routine follow-up for h/o colon and kidney cancers.   Overall, he tells me he has been doing fairly well. Appetite 100%; energy levels 25%. He attributes much of his fatigue to his chronic pain/peripheral neuropathy to his bilateral feet (managed by his PCP). Shares with me that he was recently started on Lyrica, which he thinks is helpful but it is very expensive.  Also takes Norco. He has taken Cymbalta and gabapentin in the past with minimal benefit.   He has no specific oncologic complaints today. He has occasional diarrhea, which he tries to control with his diet. "When I eat cheese, it seems to firm up the stools some."  He has occasional bleeding from the stoma when he is in the shower, but this has been chronic and is not a new issue.  He was given Bentyl at his last visit with GI; he thinks it has been helpful with some of his GI complaints.  He is urinating well. Denies any hematuria or dysuria.  No focal pain (aside from his feet).   He is not  exercising regularly. He and his wife were walking [place] regular [place] "but we fell out of the habit."  He hopes to restart their walking regimen again.    Remains with port-a-cath; he is due for port flush and labs today.  He voices concerns that I will not receive his lab results until after his visit. Provided reassurance that we would contact him with the results and review them with him via phone.      REVIEW OF SYSTEMS:  Review of Systems  Constitutional: Positive for fatigue. Negative for chills and fever.  HENT:  Negative.   Eyes: Negative.   Respiratory: Negative.   Cardiovascular: Negative.   Gastrointestinal: Positive for diarrhea. Negative for nausea and vomiting.  Endocrine: Negative.   Genitourinary: Negative.    Musculoskeletal: Negative.   Skin: Negative.   Neurological: Positive for numbness.  Hematological: Bruises/bleeds easily (on coumadin for A-fib).  Psychiatric/Behavioral: Negative.      PAST MEDICAL/SURGICAL HISTORY:  Past Medical History:  Diagnosis Date  . Adenocarcinoma of colon ([place]) 2010   Stage IIIB (T3, N2a, M0), colectomy and chemotherapy with FOLFOX  . Chronic atrial fibrillation ([place])   . CKD (chronic kidney disease) stage 2, GFR 60-89 ml/min   .  Essential hypertension   . Mixed hyperlipidemia   . Neuropathy   . Port catheter in place   . 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 Thoracic aortic aneurysm ([place])   . Type 2 diabetes mellitus ([place])    Past Surgical History:  Procedure Laterality Date  . COLECTOMY    . COLONOSCOPY N/A 02/17/2015   Procedure: COLONOSCOPY;  Surgeon: Danie Binder, MD;  Location: [facility];  Service: Endoscopy;  Laterality: N/A;  1400 via colostomy - moved to 2:45 - office to notify  . NEPHRECTOMY       SOCIAL HISTORY:  Social History   Socioeconomic History  . Marital status: Married    Spouse name: Not on file    . Number of children: Not on file  . Years of education: Not on file  . Highest education level: Not on file  Social Needs  . Financial resource strain: Not on file  . Food insecurity - worry: Not on file  . Food insecurity - inability: Not on file  . Transportation needs - medical: Not on file  . Transportation needs - non-medical: Not on file  Occupational History  . Not on file  Tobacco Use  . Smoking status: Former Smoker    Types: Cigarettes  . Smokeless tobacco: Never Used  Substance and Sexual Activity  . Alcohol use: Yes    Alcohol/week: 0.0 oz    Comment: Occasional gin and tonic  . Drug use: No  . Sexual activity: Not on file  Other Topics Concern  . Not on file  Social History Narrative  . Not on file    FAMILY HISTORY:  Family History  Problem Relation Age of Onset  . Breast cancer Mother   . Colon cancer Maternal Grandfather   . Colon cancer Brother     CURRENT MEDICATIONS:  Outpatient Encounter Medications as of 09/12/2017  Medication Sig Note  . dicyclomine (BENTYL) 10 MG capsule Take 1 capsule (10 mg total) by mouth 4 (four) times daily -  before meals and at bedtime. As needed for diarrhea.   . diltiazem (CARDIZEM) 60 MG tablet Take 60 mg by mouth 2 (two) times daily.    . ferrous sulfate 325 (65 FE) MG tablet Take 325 mg by mouth daily with breakfast.   . gabapentin (NEURONTIN) 300 MG capsule Take 300 mg by mouth 3 (three) times daily.   Marland Kitchen HYDROcodone-acetaminophen (NORCO) 10-325 MG tablet Take 1-2 tablets by mouth 4 (four) times daily as needed for moderate pain.  01/08/2016: Received from: External Pharmacy Received Sig: TAKE 1 TO 2 TABLETS BY MOUTH UP TO 4 TIMES A DAY FOR SEVERE DIABETIC NEUROPATHIC PAIN  . LYRICA 50 MG capsule    . metFORMIN (GLUCOPHAGE) 1000 MG tablet Take 1,000 mg by mouth 2 (two) times daily with a meal.    . Multiple Vitamins-Minerals (COMPLETE MULTIVITAMIN/MINERAL PO) Take 1 tablet by mouth daily.   . pioglitazone (ACTOS) 45  MG tablet Take 45 mg by mouth daily.     . simvastatin (ZOCOR) 80 MG tablet Take 40 mg by mouth every other day. Reported on 01/08/2016   . warfarin (COUMADIN) 5 MG tablet Take 5 mg by mouth daily. 10 mg M, W, F 5 mg every other day    Facility-Administered Encounter Medications as of 09/12/2017  Medication  . sodium chloride 0.9 % injection 10 mL  . sodium chloride flush (NS) 0.9 % injection 10  mL    ALLERGIES:  No Known Allergies   PHYSICAL EXAM:  ECOG Performance status: 1 - Symptomatic; remains largely independent   Vitals:   09/12/17 1304  BP: (!) 146/80  Pulse: 86  Resp: 20  Temp: 98.5 F (36.9 C)  SpO2: 95%   Filed Weights   09/12/17 1304  Weight: (!) 316 lb (143.3 kg)    Physical Exam  Constitutional: He is oriented to person, place, and time and well-developed, well-nourished, and in no distress.  HENT:  Head: Normocephalic.  Mouth/Throat: Oropharynx is clear and moist. No oropharyngeal exudate.  Eyes: Conjunctivae are normal. Pupils are equal, round, and reactive to light. No scleral icterus.  Neck: Normal range of motion. Neck supple.  Cardiovascular: Normal rate and regular rhythm.  Pulmonary/Chest: Effort normal. No respiratory distress.  Diminished breath sounds bilat bases   Abdominal: Soft. Bowel sounds are normal. There is no tenderness.  Stoma pink and moist. Normal-appearing stool output; no frank blood or black/tarry stools seen.   Musculoskeletal: Normal range of motion. He exhibits no edema.  Lymphadenopathy:    He has no cervical adenopathy.       Right: No supraclavicular adenopathy present.       Left: No supraclavicular adenopathy present.  Neurological: He is alert and oriented to person, place, and time. No cranial nerve deficit. Gait normal.  Skin: Skin is warm and dry. No rash noted.  Psychiatric: Mood and affect normal.  Word-finding difficulty at times during visit.   Nursing note and vitals reviewed.    LABORATORY DATA:  I have  reviewed the labs as listed.  CBC    Component Value Date/Time   WBC 6.1 09/12/2017 1355   RBC 3.35 (L) 09/12/2017 1355   HGB 10.0 (L) 09/12/2017 1355   HCT 31.2 (L) 09/12/2017 1355   PLT 222 09/12/2017 1355   MCV 93.1 09/12/2017 1355   MCH 29.9 09/12/2017 1355   MCHC 32.1 09/12/2017 1355   RDW 13.7 09/12/2017 1355   LYMPHSABS 1.4 09/12/2017 1355   MONOABS 0.8 09/12/2017 1355   EOSABS 0.1 09/12/2017 1355   BASOSABS 0.0 09/12/2017 1355   CMP Latest Ref Rng & Units 10/10/2016 09/05/2016 02/19/2016  Glucose 65 - 99 mg/dL 146(H) 128(H) 110(H)  BUN 6 - 20 mg/dL 14 21(H) 23(H)  Creatinine 0.61 - 1.24 mg/dL 1.32(H) 1.51(H) 1.43(H)  Sodium 135 - 145 mmol/L 137 136 137  Potassium 3.5 - 5.1 mmol/L 3.9 4.3 4.6  Chloride 101 - 111 mmol/L 106 102 106  CO2 22 - 32 mmol/L 25 26 25   Calcium 8.9 - 10.3 mg/dL 9.1 9.2 8.8(L)  Total Protein 6.5 - 8.1 g/dL 6.7 7.6 7.4  Total Bilirubin 0.3 - 1.2 mg/dL 1.0 0.3 0.4  Alkaline Phos 38 - 126 U/L 41 52 52  AST 15 - 41 U/L 10(L) 12(L) 11(L)  ALT 17 - 63 U/L 13(L) 16(L) 15(L)     PENDING LABS:    DIAGNOSTIC IMAGING:  *The following radiologic images and reports have been reviewed independently and agree with below findings.  Last CT chest/abd/pelvis: 07/03/15 CLINICAL DATA:  Colon adenocarcinoma with colostomy. Renal cell carcinoma in 2010, status post nephrectomy. Diabetes. Chronic atrial fibrillation.  EXAM: CT CHEST, ABDOMEN AND PELVIS WITHOUT CONTRAST  TECHNIQUE: Multidetector CT imaging of the chest, abdomen and pelvis was performed following the standard protocol without IV contrast.  COMPARISON:  06/24/2014 ; 06/21/2013  FINDINGS: CT CHEST FINDINGS  Mediastinum/Nodes: Fluid observed in superior pericardial recess. Left lower paratracheal lymph  node 1.1 cm in short axis, image 24 series 2, formerly 0.8 cm. Small right paratracheal lymph nodes are present including a 0.7 cm node on image 20 series 2.  Coronary, aortic arch, and  branch vessel atherosclerotic vascular disease. Left central line tip: SVC. Borderline cardiomegaly.  Lungs/Pleura: Mucus observed in the trachea. Scattered new ground-glass opacities with some central and scattered nodularity observed both lungs but especially in the left upper lobe and left lower lobe. In general the central nodular components within the ground-glass density regions are difficult to measure due to indistinct margins. One of the larger index nodular densities is 0.7 by 0.8 cm on image 28 series 6. ; this can serve as an index nodule.  Musculoskeletal: Mild thoracic spondylosis.  CT ABDOMEN PELVIS FINDINGS  Hepatobiliary: Multiple gallstones in the gallbladder measuring up to 6 mm diameter. No biliary dilatation or visualized choledocholithiasis.  Pancreas: Unremarkable  Spleen: Unremarkable  Adrenals/Urinary Tract: Right nephrectomy. No local recurrence along the nephrectomy bed observed. Exophytic 1.4 cm lesion from the left kidney lower pole, internal density 5 Hounsfield units, essentially stable from prior, favoring cyst.  Stomach/Bowel: Right colostomy. Absence of transverse and descending colon. Rectal pouch observed, blind-ending.  Vascular/Lymphatic: Small gastrohepatic, porta hepatis, and retroperitoneal lymph nodes are not pathologically enlarged. Aortoiliac atherosclerotic vascular disease.  Reproductive: Mildly prominent prostate gland, 5.4 by 4.9 cm on image 127 series 2.  Other: No supplemental non-categorized findings.  Musculoskeletal: Lower lumbar degenerative disc disease most notably at L4-5. In conjunction with facet arthropathy there is suspected foraminal impingement at L3-4, L4-5, and L5-S1. A transitional lumbosacral vertebra is assumed to represent the S1 level. Careful correlation with this numbering strategy prior to any procedural intervention would be recommended.  IMPRESSION: 1. Scattered ground-glass  opacities with some central and scattered nodularity in both lungs but especially the left lung. Appearance is most characteristic for atypical multilobar pneumonia. Short- term follow up chest CT in 4-6 weeks time recommended to ensure clearance recommended. 2. There is some minimal adenopathy in the chest, including a 1.1 cm in short axis left paratracheal lymph node, which also merits attention on followup. 3. Other imaging findings of potential clinical significance: Cholelithiasis. Mildly enlarged prostate gland. Lumbar degenerative disc disease with multilevel impingement. Coronary, aortic arch, and branch vessel atherosclerotic vascular disease. Aortoiliac atherosclerotic vascular disease. Borderline cardiomegaly.   Electronically Signed   By: Van Clines M.D.   On: 07/03/2015 14:29     PATHOLOGY:  (R) nephrectomy and colon/rectal stump resection surgical path: 06/28/2009           ASSESSMENT & PLAN:   Stage IIIB adenocarcinoma of colon:  -Diagnosed in 2010. Treated with surgical resection, followed by FOLFOX chemotherapy. Completed treatment on 03/20/10. CEA was not elevated at the time of diagnosis and has not been elevated since that time.  -Clinically, he remains without evidence of disease now 8+ years since his initial diagnosis; this is very favorable. His risk of recurrence at this time is quite low.  -NCCN Guidelines reviewed. No role for additional imaging unless clinically indicated. Explained the rationale for this today.  Last colonoscopy on 02/17/15 negative for recurrent disease with Dr. Oneida Alar; 5 colonic polyps were removed and path revealed fragments of tubular adenomas and benign ulcerated intestinal mucosa. Repeat colonoscopy due at discretion of Dr. Oneida Alar.  -Return to cancer center in 1 year for follow-up with labs.      Clear cell carcinoma or (R) kidney: -Diagnosed in 2010. Treated with right nephrectomy.  -  No evidence of disease.    -Historic labs reviewed (since he will have port flush with labs after today's visit); appears he has element of CKD with creatinine in the 1.3-1.5 range generally. Will recheck kidney function today with CMET.    Health maintenance/Wellness promotion:  -Encouraged healthy diet and exercise.   -Recommended continued regular follow-up with PCP for other age/gender appropriate cancer screenings, exams, and comorbid medical condition management as recommended.    Port-a-cath maintenance:  -Discussed option of having his port-a-cath removed since he completed his cancer treatments several years ago.  He wishes to proceed with port removal.  -Will refer him to local surgeons for port removal.    Intermittent diarrhea:  -Clinically, appears to be pretty well-controlled with his diet. Takes Bentyl. He has occasional bleeding from his stoma, which sounds more like friable tissue/longer bleeding time on anticoagulation than concern for colon cancer recurrence.  Stoma pink and moist without any concerning findings on exam today.  -Chart reviewed; Based on Walden Field, NP with GI's last note, patient needed to return for follow-up in 04/2017; he has not been seen since 02/2017.  I will help refer him back for follow-up with Dr. Oneida Alar or Walden Field, NP.    Chronic BLE peripheral neuropathy and pain:  -Managed by PCP.     A-Fib:  -Heart rate irregular on exam today; he is relatively asymptomatic. Remains on Coumadin.  -Managed by [facility] in [place].         Dispo:  -Refer back to GI for follow-up.  -Referral to Dr. Fransisca Kaufmann for port-a-cath removal.  -Return to cancer center for follow-up in 1 year with labs.    All questions were answered to patient's stated satisfaction. Encouraged patient to call with any new concerns or questions before his next visit to the cancer center and we can certain see him sooner, if needed.    Plan of care discussed with Dr. Talbert Cage, who  agrees with the above aforementioned.    A total of 30 minutes was spent in face-to-face care of this patient, with greater than 50% of that time spent in counseling and care-coordination.    Orders placed this encounter:  Orders Placed This Encounter  Procedures  . CBC with Differential/Platelet  . Comprehensive metabolic panel  . CEA  . Comprehensive metabolic panel  . CBC with Differential/Platelet  . [place], NP [place] [phone]

## 2017-09-12 NOTE — Patient Instructions (Signed)
[place] at [facility] Discharge Instructions  RECOMMENDATIONS MADE BY THE CONSULTANT AND ANY TEST RESULTS WILL BE SENT TO YOUR REFERRING PHYSICIAN.  You were seen today by Mike Craze, NP We will refer you to GI, they will call with appointment We will refer you to Dr. Arnoldo Morale to have port removed You had your port flushed today with lab work and we will call you with the results Follow up with [place] in 1 year. See schedulers up front for appointments   Thank you for choosing [place] at [facility] to provide your oncology and hematology care.  To afford each patient quality time with our provider, please arrive at least 15 minutes before your scheduled appointment time.    If you have a lab appointment with the [facility] [place] please come in thru the  Main Entrance and check in at the main information desk  You need to re-schedule your appointment should you arrive 10 or more minutes late.  We strive to give you quality time with our providers, and arriving late affects you and other patients whose appointments are after yours.  Also, if you no show three or more times for appointments you may be dismissed from the clinic at the providers discretion.     Again, thank you for choosing [facility].  Our hope is that these requests will decrease the amount of time that you wait before being seen by our physicians.       _____________________________________________________________  Should you have questions after your visit to [facility], please contact our office at [phone] between the hours of 8:30 a.m. and 4:30 p.m.  Voicemails left after 4:30 p.m. will not be returned until the following business day.  For prescription refill requests, have your pharmacy contact our office.       Resources For Cancer Patients and their Caregivers ? American Cancer Society: Can assist with transportation, wigs,  general needs, runs Look Good Feel Better.        409-372-5356 ? Cancer Care: Provides financial assistance, online support groups, medication/co-pay assistance.  1-800-813-HOPE 313-161-3354) ? [facility] Assists [place] cancer patients and their families through emotional , educational and financial support.  (470)249-2258 ? [place] DSS Where to apply for food stamps, Medicaid and utility assistance. 602-549-1106 ? RCATS: Transportation to medical appointments. 315 506 0564 ? Social Security Administration: May apply for disability if have a Stage IV cancer. 807-747-6716 248-352-3067 ? [employer], Disability and Transit Services: Assists with nutrition, care and transit needs. [place] Support Programs: @10RELATIVEDAYS @ > Cancer Support Group  2nd Tuesday of the month 1pm-2pm, Journey Room  > Creative Journey  3rd Tuesday of the month 1130am-1pm, Journey Room  > Look Good Feel Better  1st Wednesday of the month 10am-12 noon, Journey Room (Call [facility] to register [phone])

## 2017-09-12 NOTE — Progress Notes (Unsigned)
[name] presented for Portacath access and flush. Portacath located left chest wall accessed with  H 20 needle. Good blood return present and labs drawn from port. Portacath flushed with 28ml NS and 500U/88ml Heparin and needle removed intact. Procedure without incident. Patient tolerated procedure well. Patient discharged ambulatory and in stable condition from clinic to self. Follow up as scheduled.

## 2017-09-12 NOTE — Patient Instructions (Signed)
[facility] at [facility] Discharge Instructions  RECOMMENDATIONS MADE BY THE CONSULTANT AND ANY TEST RESULTS WILL BE SENT TO YOUR REFERRING PHYSICIAN.  You had your port flushed today. Follow up as scheduled.  Thank you for choosing [facility] at [facility] to provide your oncology and hematology care.  To afford each patient quality time with our provider, please arrive at least 15 minutes before your scheduled appointment time.    If you have a lab appointment with the Cancer Center please come in thru the  Main Entrance and check in at the main information desk  You need to re-schedule your appointment should you arrive 10 or more minutes late.  We strive to give you quality time with our providers, and arriving late affects you and other patients whose appointments are after yours.  Also, if you no show three or more times for appointments you may be dismissed from the clinic at the providers discretion.     Again, thank you for choosing [facility].  Our hope is that these requests will decrease the amount of time that you wait before being seen by our physicians.       _____________________________________________________________  Should you have questions after your visit to [facility], please contact our office at [phone] between the hours of 8:30 a.m. and 4:30 p.m.  Voicemails left after 4:30 p.m. will not be returned until the following business day.  For prescription refill requests, have your pharmacy contact our office.       Resources For Cancer Patients and their Caregivers ? American Cancer Society: Can assist with transportation, wigs, general needs, runs Look Good Feel Better.        1-888-227-6333 ? Cancer Care: Provides financial assistance, online support groups, medication/co-pay assistance.  1-800-813-HOPE (4673) ? [facility] Assists [place] cancer  patients and their families through emotional , educational and financial support.  336-427-4357 ? [place] DSS Where to apply for food stamps, Medicaid and utility assistance. 336-342-1394 ? RCATS: Transportation to medical appointments. 336-347-2287 ? Social Security Administration: May apply for disability if have a Stage IV cancer. 336-342-7796 1-800-772-1213 ? [place] Aging, Disability and Transit Services: Assists with nutrition, care and transit needs. 336-349-2343  Cancer Center Support Programs: @10RELATIVEDAYS@ > Cancer Support Group  2nd Tuesday of the month 1pm-2pm, Journey Room  > Creative Journey  3rd Tuesday of the month 1130am-1pm, Journey Room  > Look Good Feel Better  1st Wednesday of the month 10am-12 noon, Journey Room (Call American Cancer Society to register 1-800-395-5775)    

## 2017-09-13 LAB — CEA: CEA: 2.7 ng/mL (ref 0.0–4.7)

## 2017-09-18 ENCOUNTER — Other Ambulatory Visit (HOSPITAL_COMMUNITY): Payer: Self-pay | Admitting: Adult Health

## 2017-09-18 DIAGNOSIS — D649 Anemia, unspecified: Secondary | ICD-10-CM

## 2017-10-07 ENCOUNTER — Ambulatory Visit: Payer: Medicare Other | Admitting: General Surgery

## 2017-10-07 ENCOUNTER — Ambulatory Visit (INDEPENDENT_AMBULATORY_CARE_PROVIDER_SITE_OTHER): Payer: Medicare Other | Admitting: General Surgery

## 2017-10-07 ENCOUNTER — Encounter: Payer: Self-pay | Admitting: General Surgery

## 2017-10-07 VITALS — BP 155/85 | HR 92 | Temp 96.8°F | Ht 74.0 in | Wt 317.0 lb

## 2017-10-07 DIAGNOSIS — C189 Malignant neoplasm of colon, unspecified: Secondary | ICD-10-CM | POA: Diagnosis not present

## 2017-10-07 NOTE — Patient Instructions (Signed)
Implanted Port Removal Implanted port removal is a procedure to remove the port and catheter (port-a-cath) that is implanted under your skin. The port is a small disc under your skin that can be punctured with a needle. It is connected to a vein in your chest or neck by a small flexible tube (catheter). The port-a-cath is used for treatment through an IV tube and for taking blood samples. Your health care provider will remove the port-a-cath if:  You no longer need it for treatment.  It is not working properly.  The area around it gets infected.  Tell a health care provider about:  Any allergies you have.  All medicines you are taking, including vitamins, herbs, eye drops, creams, and over-the-counter medicines.  Any problems you or family members have had with anesthetic medicines.  Any blood disorders you have.  Any surgeries you have had.  Any medical conditions you have.  Whether you are pregnant or may be pregnant. What are the risks? Generally, this is a safe procedure. However, problems may occur, including:  Infection.  Bleeding.  Allergic reactions to anesthetic medicines.  Damage to nerves or blood vessels.  What happens before the procedure?  You will have: ? A physical exam. ? Blood tests. ? Imaging tests, including a chest X-ray.  Follow instructions from your health care provider about eating or drinking restrictions.  Ask your health care provider about: ? Changing or stopping your regular medicines. This is especially important if you are taking diabetes medicines or blood thinners. ? Taking medicines such as aspirin and ibuprofen. These medicines can thin your blood. Do not take these medicines before your procedure if your surgeon instructs you not to.  Ask your health care provider how your surgical site will be marked or identified.  You may be given antibiotic medicine to help prevent infection.  Plan to have someone take you home after the  procedure.  If you will be going home right after the procedure, plan to have someone stay with you for 24 hours. What happens during the procedure?  To reduce your risk of infection: ? Your health care team will wash or sanitize their hands. ? Your skin will be washed with soap.  You may be given one or more of the following: ? A medicine to help you relax (sedative). ? A medicine to numb the area (local anesthetic).  A small cut (incision) will be made at the site of your port-a-cath.  The port-a-cath and the catheter that has been inside your vein will gently be removed.  The incision will be closed with stitches (sutures), adhesive strips, or skin glue.  A bandage (dressing) will be placed over the incision. The procedure may vary among health care providers and hospitals. What happens after the procedure?  Your blood pressure, heart rate, breathing rate, and blood oxygen level will be monitored often until the medicines you were given have worn off.  Do not drive for 24 hours if you received a sedative. This information is not intended to replace advice given to you by your health care provider. Make sure you discuss any questions you have with your health care provider. Document Released: 08/28/2015 Document Revised: 02/22/2016 Document Reviewed: 06/21/2015 Elsevier Interactive Patient Education  2018 Elsevier Inc.  

## 2017-10-07 NOTE — Progress Notes (Signed)
[name]; [id number]; 19-May-1946   HPI Patient is a 72 year old white male who was referred to my care by oncology for removal of a Port-A-Cath.  He has had colon cancer and has finished his chemotherapy.  He currently has no pain.  He is on Coumadin for atrial fibrillation. Past Medical History:  Diagnosis Date  . Adenocarcinoma of colon ([place]) 2010   Stage IIIB (T3, N2a, M0), colectomy and chemotherapy with FOLFOX  . Chronic atrial fibrillation ([place])   . CKD (chronic kidney disease) stage 2, GFR 60-89 ml/min   . Essential hypertension   . Mixed hyperlipidemia   . Neuropathy   . Port catheter in place   . 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 Thoracic aortic aneurysm ([place])   . Type 2 diabetes mellitus ([place])     Past Surgical History:  Procedure Laterality Date  . COLECTOMY    . COLONOSCOPY N/A 02/17/2015   Procedure: COLONOSCOPY;  Surgeon: Danie Binder, MD;  [facility];  Service: Endoscopy;  Laterality: N/A;  1400 via colostomy - moved to 2:45 - office to notify  . NEPHRECTOMY      Family History  Problem Relation Age of Onset  . Breast cancer Mother   . Colon cancer Maternal Grandfather   . Colon cancer Brother     Current Outpatient Medications on File Prior to Visit  Medication Sig Dispense Refill  . dicyclomine (BENTYL) 10 MG capsule Take 1 capsule (10 mg total) by mouth 4 (four) times daily -  before meals and at bedtime. As needed for diarrhea. 120 capsule 1  . diltiazem (CARDIZEM) 60 MG tablet Take 60 mg by mouth 2 (two) times daily.     . ferrous sulfate 325 (65 FE) MG tablet Take 325 mg by mouth daily with breakfast.    . gabapentin (NEURONTIN) 300 MG capsule Take 300 mg by mouth 3 (three) times daily.    Marland Kitchen HYDROcodone-acetaminophen (NORCO) 10-325 MG tablet Take 1-2 tablets by mouth 4 (four) times daily as needed for moderate pain.   0  . LYRICA 50 MG capsule     . metFORMIN (GLUCOPHAGE) 1000 MG tablet Take  1,000 mg by mouth 2 (two) times daily with a meal.     . Multiple Vitamins-Minerals (COMPLETE MULTIVITAMIN/MINERAL PO) Take 1 tablet by mouth daily.    . pioglitazone (ACTOS) 45 MG tablet Take 45 mg by mouth daily.      . simvastatin (ZOCOR) 80 MG tablet Take 40 mg by mouth every other day. Reported on 01/08/2016    . warfarin (COUMADIN) 5 MG tablet Take 5 mg by mouth daily. 10 mg M, W, F 5 mg every other day     Current Facility-Administered Medications on File Prior to Visit  Medication Dose Route Frequency Provider Last Rate Last Dose  . sodium chloride 0.9 % injection 10 mL  10 mL Intravenous PRN Penland, Kelby Fam, MD   10 mL at 02/21/15 1415  . sodium chloride flush (NS) 0.9 % injection 10 mL  10 mL Intravenous PRN Baird Cancer, PA-C   10 mL at 10/31/16 1342  . sodium chloride flush (NS) 0.9 % injection 10 mL  10 mL Intravenous PRN Holley Bouche, NP   10 mL at 09/12/17 1352    No Known Allergies  Social History   Substance and Sexual Activity  Alcohol Use Yes  . Alcohol/week: 0.0 oz   Comment:  Occasional gin and tonic    Social History   Tobacco Use  Smoking Status Former Smoker  . Types: Cigarettes  Smokeless Tobacco Never Used    Review of Systems  Constitutional: Negative.   HENT: Negative.   Eyes: Negative.   Respiratory: Negative.   Cardiovascular: Negative.   Gastrointestinal: Negative.   Genitourinary: Negative.   Musculoskeletal: Negative.   Skin: Negative.   Neurological: Negative.   Endo/Heme/Allergies: Negative.   Psychiatric/Behavioral: Negative.     Objective   Vitals:   10/07/17 1045  BP: (!) 155/85  Pulse: 92  Temp: (!) 96.8 F (36 C)    Physical Exam  Constitutional: He is oriented to person, place, and time and well-developed, well-nourished, and in no distress.  HENT:  Head: Normocephalic and atraumatic.  Cardiovascular: Normal heart sounds. Exam reveals no gallop and no friction rub.  No murmur heard. Irregularly  irregular rhythm.  Pulmonary/Chest: Effort normal and breath sounds normal. No respiratory distress. He has no wheezes. He has no rales.  Port-A-Cath in place in left upper chest.  Neurological: He is alert and oriented to person, place, and time.  Skin: Skin is warm and dry.  Vitals reviewed.   Assessment  Colon carcinoma, finished with chemotherapy Plan   Patient is scheduled for Port-A-Cath removal in the minor procedure room on 10/17/2017.  The risks and benefits of the procedure were fully explained to the patient, who gave informed consent.  He is should stop his Coumadin 4 days prior to the procedure.

## 2017-10-07 NOTE — H&P (Signed)
[name]; [id number]; 1945-11-29   HPI Patient is a 72 year old white male who was referred to my care by oncology for removal of a Port-A-Cath.  He has had colon cancer and has finished his chemotherapy.  He currently has no pain.  He is on Coumadin for atrial fibrillation. Past Medical History:  Diagnosis Date  . Adenocarcinoma of colon ([place]) 2010   Stage IIIB (T3, N2a, M0), colectomy and chemotherapy with FOLFOX  . Chronic atrial fibrillation ([place])   . CKD (chronic kidney disease) stage 2, GFR 60-89 ml/min   . Essential hypertension   . Mixed hyperlipidemia   . Neuropathy   . Port catheter in place   . 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 Thoracic aortic aneurysm ([place])   . Type 2 diabetes mellitus ([place])     Past Surgical History:  Procedure Laterality Date  . COLECTOMY    . COLONOSCOPY N/A 02/17/2015   Procedure: COLONOSCOPY;  Surgeon: Danie Binder, MD;  [facility];  Service: Endoscopy;  Laterality: N/A;  1400 via colostomy - moved to 2:45 - office to notify  . NEPHRECTOMY      Family History  Problem Relation Age of Onset  . Breast cancer Mother   . Colon cancer Maternal Grandfather   . Colon cancer Brother     Current Outpatient Medications on File Prior to Visit  Medication Sig Dispense Refill  . dicyclomine (BENTYL) 10 MG capsule Take 1 capsule (10 mg total) by mouth 4 (four) times daily -  before meals and at bedtime. As needed for diarrhea. 120 capsule 1  . diltiazem (CARDIZEM) 60 MG tablet Take 60 mg by mouth 2 (two) times daily.     . ferrous sulfate 325 (65 FE) MG tablet Take 325 mg by mouth daily with breakfast.    . gabapentin (NEURONTIN) 300 MG capsule Take 300 mg by mouth 3 (three) times daily.    Marland Kitchen HYDROcodone-acetaminophen (NORCO) 10-325 MG tablet Take 1-2 tablets by mouth 4 (four) times daily as needed for moderate pain.   0  . LYRICA 50 MG capsule     . metFORMIN (GLUCOPHAGE) 1000 MG tablet Take  1,000 mg by mouth 2 (two) times daily with a meal.     . Multiple Vitamins-Minerals (COMPLETE MULTIVITAMIN/MINERAL PO) Take 1 tablet by mouth daily.    . pioglitazone (ACTOS) 45 MG tablet Take 45 mg by mouth daily.      . simvastatin (ZOCOR) 80 MG tablet Take 40 mg by mouth every other day. Reported on 01/08/2016    . warfarin (COUMADIN) 5 MG tablet Take 5 mg by mouth daily. 10 mg M, W, F 5 mg every other day     Current Facility-Administered Medications on File Prior to Visit  Medication Dose Route Frequency Provider Last Rate Last Dose  . sodium chloride 0.9 % injection 10 mL  10 mL Intravenous PRN Penland, Kelby Fam, MD   10 mL at 02/21/15 1415  . sodium chloride flush (NS) 0.9 % injection 10 mL  10 mL Intravenous PRN Baird Cancer, PA-C   10 mL at 10/31/16 1342  . sodium chloride flush (NS) 0.9 % injection 10 mL  10 mL Intravenous PRN Holley Bouche, NP   10 mL at 09/12/17 1352    No Known Allergies  Social History   Substance and Sexual Activity  Alcohol Use Yes  . Alcohol/week: 0.0 oz   Comment:  Occasional gin and tonic    Social History   Tobacco Use  Smoking Status Former Smoker  . Types: Cigarettes  Smokeless Tobacco Never Used    Review of Systems  Constitutional: Negative.   HENT: Negative.   Eyes: Negative.   Respiratory: Negative.   Cardiovascular: Negative.   Gastrointestinal: Negative.   Genitourinary: Negative.   Musculoskeletal: Negative.   Skin: Negative.   Neurological: Negative.   Endo/Heme/Allergies: Negative.   Psychiatric/Behavioral: Negative.     Objective   Vitals:   10/07/17 1045  BP: (!) 155/85  Pulse: 92  Temp: (!) 96.8 F (36 C)    Physical Exam  Constitutional: He is oriented to person, place, and time and well-developed, well-nourished, and in no distress.  HENT:  Head: Normocephalic and atraumatic.  Cardiovascular: Normal heart sounds. Exam reveals no gallop and no friction rub.  No murmur heard. Irregularly  irregular rhythm.  Pulmonary/Chest: Effort normal and breath sounds normal. No respiratory distress. He has no wheezes. He has no rales.  Port-A-Cath in place in left upper chest.  Neurological: He is alert and oriented to person, place, and time.  Skin: Skin is warm and dry.  Vitals reviewed.   Assessment  Colon carcinoma, finished with chemotherapy Plan   Patient is scheduled for Port-A-Cath removal in the minor procedure room on 10/17/2017.  The risks and benefits of the procedure were fully explained to the patient, who gave informed consent.  He is should stop his Coumadin 4 days prior to the procedure.

## 2017-10-17 ENCOUNTER — Encounter (HOSPITAL_COMMUNITY)
Admission: RE | Disposition: A | Discharge status: Home / Self Care | Payer: Self-pay | Source: Ambulatory Visit | Attending: General Surgery

## 2017-10-17 ENCOUNTER — Ambulatory Visit (HOSPITAL_COMMUNITY)
Admission: RE | Admit: 2017-10-17 | Discharge: 2017-10-17 | Disposition: A | Discharge status: Home / Self Care | Payer: Medicare Other | Source: Ambulatory Visit | Attending: General Surgery | Admitting: General Surgery

## 2017-10-17 ENCOUNTER — Encounter (HOSPITAL_COMMUNITY): Payer: Self-pay | Admitting: *Deleted

## 2017-10-17 DIAGNOSIS — E1140 Type 2 diabetes mellitus with diabetic neuropathy, unspecified: Secondary | ICD-10-CM | POA: Insufficient documentation

## 2017-10-17 DIAGNOSIS — Z452 Encounter for adjustment and management of vascular access device: Secondary | ICD-10-CM | POA: Insufficient documentation

## 2017-10-17 DIAGNOSIS — I712 Thoracic aortic aneurysm, without rupture: Secondary | ICD-10-CM | POA: Diagnosis not present

## 2017-10-17 DIAGNOSIS — Z87891 Personal history of nicotine dependence: Secondary | ICD-10-CM | POA: Diagnosis not present

## 2017-10-17 DIAGNOSIS — E1122 Type 2 diabetes mellitus with diabetic chronic kidney disease: Secondary | ICD-10-CM | POA: Insufficient documentation

## 2017-10-17 DIAGNOSIS — Z9221 Personal history of antineoplastic chemotherapy: Secondary | ICD-10-CM | POA: Insufficient documentation

## 2017-10-17 DIAGNOSIS — C189 Malignant neoplasm of colon, unspecified: Secondary | ICD-10-CM

## 2017-10-17 DIAGNOSIS — I482 Chronic atrial fibrillation: Secondary | ICD-10-CM | POA: Insufficient documentation

## 2017-10-17 DIAGNOSIS — N182 Chronic kidney disease, stage 2 (mild): Secondary | ICD-10-CM | POA: Diagnosis not present

## 2017-10-17 DIAGNOSIS — Z7901 Long term (current) use of anticoagulants: Secondary | ICD-10-CM | POA: Insufficient documentation

## 2017-10-17 DIAGNOSIS — E782 Mixed hyperlipidemia: Secondary | ICD-10-CM | POA: Insufficient documentation

## 2017-10-17 DIAGNOSIS — Z905 Acquired absence of kidney: Secondary | ICD-10-CM | POA: Diagnosis not present

## 2017-10-17 DIAGNOSIS — Z85528 Personal history of other malignant neoplasm of kidney: Secondary | ICD-10-CM | POA: Insufficient documentation

## 2017-10-17 DIAGNOSIS — Z85038 Personal history of other malignant neoplasm of large intestine: Secondary | ICD-10-CM | POA: Diagnosis not present

## 2017-10-17 DIAGNOSIS — Z9049 Acquired absence of other specified parts of digestive tract: Secondary | ICD-10-CM | POA: Insufficient documentation

## 2017-10-17 DIAGNOSIS — Z79899 Other long term (current) drug therapy: Secondary | ICD-10-CM | POA: Diagnosis not present

## 2017-10-17 DIAGNOSIS — I129 Hypertensive chronic kidney disease with stage 1 through stage 4 chronic kidney disease, or unspecified chronic kidney disease: Secondary | ICD-10-CM | POA: Diagnosis not present

## 2017-10-17 DIAGNOSIS — Z800 Family history of malignant neoplasm of digestive organs: Secondary | ICD-10-CM | POA: Insufficient documentation

## 2017-10-17 HISTORY — PX: PORT-A-CATH REMOVAL: SHX5289

## 2017-10-17 LAB — GLUCOSE, CAPILLARY: GLUCOSE-CAPILLARY: 100 mg/dL — AB (ref 65–99)

## 2017-10-17 SURGERY — MINOR REMOVAL PORT-A-CATH
Anesthesia: LOCAL

## 2017-10-17 MED ORDER — LIDOCAINE HCL (PF) 1 % IJ SOLN
INTRAMUSCULAR | Status: AC
  Filled 2017-10-17: qty 30

## 2017-10-17 MED ORDER — LIDOCAINE HCL (PF) 1 % IJ SOLN
INTRAMUSCULAR | Status: DC | PRN
  Administered 2017-10-17: 4 mL via SUBCUTANEOUS

## 2017-10-17 SURGICAL SUPPLY — 22 items
ADH SKN CLS APL DERMABOND .7 (GAUZE/BANDAGES/DRESSINGS) ×1
CHLORAPREP W/TINT 10.5 ML (MISCELLANEOUS) ×2 IMPLANT
CLOTH BEACON ORANGE TIMEOUT ST (SAFETY) ×2 IMPLANT
DECANTER SPIKE VIAL GLASS SM (MISCELLANEOUS) ×2 IMPLANT
DERMABOND ADVANCED (GAUZE/BANDAGES/DRESSINGS) ×1
DERMABOND ADVANCED .7 DNX12 (GAUZE/BANDAGES/DRESSINGS) ×1 IMPLANT
DRAPE PROXIMA HALF (DRAPES) ×2 IMPLANT
ELECT REM PT RETURN 9FT ADLT (ELECTROSURGICAL) ×2
ELECTRODE REM PT RTRN 9FT ADLT (ELECTROSURGICAL) ×1 IMPLANT
GLOVE BIOGEL PI IND STRL 7.0 (GLOVE) ×1 IMPLANT
GLOVE BIOGEL PI INDICATOR 7.0 (GLOVE) ×1
GLOVE SURG SS PI 7.5 STRL IVOR (GLOVE) ×2 IMPLANT
GOWN STRL REUS W/TWL LRG LVL3 (GOWN DISPOSABLE) ×2 IMPLANT
NDL HYPO 25X1 1.5 SAFETY (NEEDLE) ×1 IMPLANT
NEEDLE HYPO 25X1 1.5 SAFETY (NEEDLE) ×2 IMPLANT
PENCIL HANDSWITCHING (ELECTRODE) ×2 IMPLANT
SPONGE GAUZE 2X2 8PLY STRL LF (GAUZE/BANDAGES/DRESSINGS) ×2 IMPLANT
SUT MNCRL AB 4-0 PS2 18 (SUTURE) ×2 IMPLANT
SUT VIC AB 3-0 SH 27 (SUTURE) ×2
SUT VIC AB 3-0 SH 27X BRD (SUTURE) ×1 IMPLANT
SYR CONTROL 10ML LL (SYRINGE) ×2 IMPLANT
TOWEL OR 17X26 4PK STRL BLUE (TOWEL DISPOSABLE) ×2 IMPLANT

## 2017-10-17 NOTE — Op Note (Signed)
Patient:  [name]  DOB:  06-10-1946  MRN:  [id number]   Preop Diagnosis: Colon carcinoma, finished with chemotherapy  Postop Diagnosis: Same  Procedure: Port-A-Cath removal  Surgeon: Aviva Signs, MD  Anes: Local  Indications: Patient is a 72 year old white male with colon carcinoma who has finished with chemotherapy.  He was referred by oncology for Port-A-Cath removal.  The risks and benefits of the procedure were fully explained to the patient, who gave informed consent.  He has held his Coumadin.  Procedure note: Patient was placed in supine position.  Left upper chest was prepped and draped using the usual sterile technique with DuraPrep.  Surgical site confirmation was performed.  1% Xylocaine was used for local anesthesia.  An incision was made through the previous surgical incision site.  This was taken down to the Port-A-Cath.  The Port-A-Cath was removed in total without difficulty.  It was disposed of.  The catheter tunnel was closed using a 3-0 Vicryl figure-of-eight suture.  Subcutaneous layer was reapproximated using a 3-0 Vicryl interrupted suture.  The skin was closed using a 4-0 Vicryl subcuticular suture.  Dermabond was applied.  All tape and needle counts were correct at the end of the procedure.  The patient was discharged from the minor procedure room in good and stable condition.   Complications: None  EBL: Minimal  Specimen: None

## 2017-10-17 NOTE — Interval H&P Note (Signed)
History and Physical Interval Note:  10/17/2017 8:38 AM  [name]  has presented today for surgery, with the diagnosis of colon cancer  The various methods of treatment have been discussed with the patient and family. After consideration of risks, benefits and other options for treatment, the patient has consented to  Procedure(s): MINOR REMOVAL PORT-A-CATH (N/A) as a surgical intervention .  The patient's history has been reviewed, patient examined, no change in status, stable for surgery.  I have reviewed the patient's chart and labs.  Questions were answered to the patient's satisfaction.     Aviva Signs

## 2017-10-17 NOTE — Discharge Instructions (Signed)
Implanted Port Removal, Care After °Refer to this sheet in the next few weeks. These instructions provide you with information about caring for yourself after your procedure. Your health care provider may also give you more specific instructions. Your treatment has been planned according to current medical practices, but problems sometimes occur. Call your health care provider if you have any problems or questions after your procedure. °What can I expect after the procedure? °After the procedure, it is common to have: °· Soreness or pain near your incision. °· Some swelling or bruising near your incision. ° °Follow these instructions at home: °Medicines °· Take over-the-counter and prescription medicines only as told by your health care provider. °· If you were prescribed an antibiotic medicine, take it as told by your health care provider. Do not stop taking the antibiotic even if you start to feel better. °Bathing °· Do not take baths, swim, or use a hot tub until your health care provider approves. Ask your health care provider if you can take showers. You may only be allowed to take sponge baths for bathing. °Incision care °· Follow instructions from your health care provider about how to take care of your incision. Make sure you: °? Wash your hands with soap and water before you change your bandage (dressing). If soap and water are not available, use hand sanitizer. °? Change your dressing as told by your health care provider. °? Keep your dressing dry. °? Leave stitches (sutures), skin glue, or adhesive strips in place. These skin closures may need to stay in place for 2 weeks or longer. If adhesive strip edges start to loosen and curl up, you may trim the loose edges. Do not remove adhesive strips completely unless your health care provider tells you to do that. °· Check your incision area every day for signs of infection. Check for: °? More redness, swelling, or pain. °? More fluid or  blood. °? Warmth. °? Pus or a bad smell. °Driving °· If you received a sedative, do not drive for 24 hours after the procedure. °· If you did not receive a sedative, ask your health care provider when it is safe to drive. °Activity °· Return to your normal activities as told by your health care provider. Ask your health care provider what activities are safe for you. °· Until your health care provider says it is safe: °? Do not lift anything that is heavier than 10 lb (4.5 kg). °? Do not do activities that involve lifting your arms over your head. °General instructions °· Do not use any tobacco products, such as cigarettes, chewing tobacco, and e-cigarettes. Tobacco can delay healing. If you need help quitting, ask your health care provider. °· Keep all follow-up visits as told by your health care provider. This is important. °Contact a health care provider if: °· You have more redness, swelling, or pain around your incision. °· You have more fluid or blood coming from your incision. °· Your incision feels warm to the touch. °· You have pus or a bad smell coming from your incision. °· You have a fever. °· You have pain that is not relieved by your pain medicine. °Get help right away if: °· You have chest pain. °· You have difficulty breathing. °This information is not intended to replace advice given to you by your health care provider. Make sure you discuss any questions you have with your health care provider. °Document Released: 08/28/2015 Document Revised: 02/22/2016 Document Reviewed: 06/21/2015 °Elsevier Interactive Patient   Education © 2018 [employer] Inc. ° °

## 2017-10-20 ENCOUNTER — Encounter (HOSPITAL_COMMUNITY): Payer: Self-pay | Admitting: General Surgery

## 2017-10-23 ENCOUNTER — Telehealth: Payer: Self-pay | Admitting: Gastroenterology

## 2017-10-23 ENCOUNTER — Telehealth: Payer: Self-pay

## 2017-10-23 DIAGNOSIS — R197 Diarrhea, unspecified: Secondary | ICD-10-CM

## 2017-10-23 MED ORDER — DICYCLOMINE HCL 20 MG PO TABS: 20.0000 mg | ORAL_TABLET | Freq: Two times a day (BID) | ORAL | 1 refills | Status: AC | PRN

## 2017-10-23 NOTE — Telephone Encounter (Signed)
Changed dose to 20 mg bid prn. Sent 90 day supply with 1 refill to pharmacy.  Call if any questions.

## 2017-10-23 NOTE — Telephone Encounter (Signed)
See phone note today with med request.

## 2017-10-23 NOTE — Telephone Encounter (Signed)
Pt called to say that his new insurance is requiring him to use a new pharmacy (mail order pharmacy). He said it was [facility]. The prescription is Dicyclomine and the rx# [id number]. The pharmacy can be reached at [phone] and the fax is [phone]. He asked for a return call from the nurse as well. His number is [phone] or [phone]

## 2017-10-23 NOTE — Telephone Encounter (Signed)
Pt has a new mail pharmacy and I have put it in as primary for now. ( [facility]).   He needs 90 day supply of the Dicyclomine.  He has been taking 10 mg just twice a day and I told him he could take it up to 4 times a day.  He would like to know if Walden Field, NP thought he could take 20 mg bid instead of 10 mg qid.  If so , can send the 20 mg tablets.  Randall Hiss, Please advise!

## 2017-10-23 NOTE — Telephone Encounter (Signed)
See previous message

## 2017-10-23 NOTE — Telephone Encounter (Signed)
LMOM to call.

## 2017-10-23 NOTE — Telephone Encounter (Signed)
PLEASE CALL PATIENT BEFORE YOU CALL HIS MAIL ORDER PHARMACY, HE HAS SEVERAL QUESTIONS AND MESSAGE HE NEEDS TO RELAY

## 2017-10-24 ENCOUNTER — Telehealth: Payer: Self-pay | Admitting: Pulmonary Disease

## 2017-10-24 ENCOUNTER — Ambulatory Visit (INDEPENDENT_AMBULATORY_CARE_PROVIDER_SITE_OTHER): Payer: Medicare Other | Admitting: Pulmonary Disease

## 2017-10-24 ENCOUNTER — Encounter: Payer: Self-pay | Admitting: Pulmonary Disease

## 2017-10-24 VITALS — BP 132/78 | HR 110 | Ht 74.0 in | Wt 314.6 lb

## 2017-10-24 DIAGNOSIS — R0600 Dyspnea, unspecified: Secondary | ICD-10-CM | POA: Diagnosis not present

## 2017-10-24 DIAGNOSIS — R05 Cough: Secondary | ICD-10-CM

## 2017-10-24 DIAGNOSIS — R9389 Abnormal findings on diagnostic imaging of other specified body structures: Secondary | ICD-10-CM | POA: Diagnosis not present

## 2017-10-24 DIAGNOSIS — R059 Cough, unspecified: Secondary | ICD-10-CM

## 2017-10-24 MED ORDER — BUDESONIDE-FORMOTEROL FUMARATE 160-4.5 MCG/ACT IN AERO: 2.0000 | INHALATION_SPRAY | Freq: Two times a day (BID) | RESPIRATORY_TRACT | 0 refills | Status: DC

## 2017-10-24 MED ORDER — BUDESONIDE-FORMOTEROL FUMARATE 160-4.5 MCG/ACT IN AERO: 2.0000 | INHALATION_SPRAY | Freq: Two times a day (BID) | RESPIRATORY_TRACT | 6 refills | Status: DC

## 2017-10-24 MED ORDER — BUDESONIDE-FORMOTEROL FUMARATE 160-4.5 MCG/ACT IN AERO: 2.0000 | INHALATION_SPRAY | Freq: Two times a day (BID) | RESPIRATORY_TRACT | 1 refills | Status: DC

## 2017-10-24 NOTE — Progress Notes (Signed)
[name]    [id number]    December 27, 1945  Primary Care Physician:Knowlton, Richardson Landry, MD  Referring Physician: Lemmie Evens, MD [place], [place] [address]  Chief complaint:  Follow up for abnormal CT, Upper airway cough syndrome.   HPI: 72 year old with past medical history of colon cancer, chronic kidney disease, followed by Dr. Murlean Iba He had bilateral groundglass opacities with nodular infiltrate in 2016 on CT scans done as a part of annual follow-up for colon cancer This was treated with multiple rounds of antibiotics.  Bronchoscopy was considered but deferred as he improved and his last CT scan in March 2017 shows clear lungs He is also being followed up for chronic cough thought to be secondary to postnasal drip.  He is on steroid nasal spray which he uses intermittently  Complains of worsening dyspnea on exertion over the past year.  He does not have symptoms at rest.  Denies any cough, sputum production, fevers, chills.  Reports that his breathing gets better when he starts using the steroid nasal spray.  Pets: Dog, No cats birds Occupation: Retired from [employer]er, [employer], [employer] Exposures: No known exposures. No mold, hot tub Smoking history: Quit in 2010. 40 pack year.  Travel History: No significant.   Outpatient Encounter Medications as of 10/24/2017  Medication Sig  . CALCIUM PO Take 600 mg by mouth daily.  Marland Kitchen dicyclomine (BENTYL) 20 MG tablet Take 1 tablet (20 mg total) by mouth 2 (two) times daily as needed for spasms.  Marland Kitchen diltiazem (CARDIZEM) 60 MG tablet Take 60 mg by mouth 4 (four) times daily.   . ferrous sulfate 325 (65 FE) MG tablet Take 325 mg by mouth daily with breakfast.  . fluticasone (FLONASE) 50 MCG/ACT nasal spray Place 2 sprays into both nostrils at bedtime as needed for allergies or rhinitis.  Marland Kitchen gabapentin (NEURONTIN) 300 MG capsule Take 600 mg by mouth 4 (four) times daily.   Marland Kitchen HYDROcodone-acetaminophen (NORCO) 10-325 MG tablet  Take 1-2 tablets by mouth 4 (four) times daily as needed for moderate pain.   Marland Kitchen LYRICA 50 MG capsule Take 50-100 mg by mouth 4 (four) times daily as needed (for pain).   . metFORMIN (GLUCOPHAGE) 1000 MG tablet Take 1,000 mg by mouth 2 (two) times daily with a meal.   . Multiple Vitamins-Minerals (COMPLETE MULTIVITAMIN/MINERAL PO) Take 1 tablet by mouth daily.  . Omega-3 Fatty Acids (FISH OIL PO) Take 1 capsule by mouth daily.  . pioglitazone (ACTOS) 45 MG tablet Take 45 mg by mouth daily.    . Probiotic Product (ADVANCED PROBIOTIC PO) Take 1 capsule by mouth 2 (two) times daily.  . simvastatin (ZOCOR) 80 MG tablet Take 40 mg by mouth every other day.   . warfarin (COUMADIN) 5 MG tablet Take 5-10 mg by mouth See admin instructions. Take 10 mg by mouth daily on Monday, Wednesday and Friday. Take 5 mg by mouth daily on all other days   Facility-Administered Encounter Medications as of 10/24/2017  Medication  . sodium chloride 0.9 % injection 10 mL  . sodium chloride flush (NS) 0.9 % injection 10 mL  . sodium chloride flush (NS) 0.9 % injection 10 mL    Allergies as of 10/24/2017  . (No Known Allergies)    Past Medical History:  Diagnosis Date  . Adenocarcinoma of colon ([place]) 2010   Stage IIIB (T3, N2a, M0), colectomy and chemotherapy with FOLFOX  . Chronic atrial fibrillation ([place])   .  CKD (chronic kidney disease) stage 2, GFR 60-89 ml/min   . Essential hypertension   . Mixed hyperlipidemia   . Neuropathy   . Port catheter in place   . 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 Thoracic aortic aneurysm ([place])   . Type 2 diabetes mellitus ([place])     Past Surgical History:  Procedure Laterality Date  . COLECTOMY    . COLONOSCOPY N/A 02/17/2015   Procedure: COLONOSCOPY;  Surgeon: Danie Binder, MD;  Location: [facility];  Service: Endoscopy;  Laterality: N/A;  1400 via colostomy - moved to 2:45 - office to notify  . NEPHRECTOMY    . PORT-A-CATH  REMOVAL N/A 10/17/2017   Procedure: MINOR REMOVAL PORT-A-CATH;  Surgeon: Aviva Signs, MD;  Location: [facility] ORS;  Service: General;  Laterality: N/A;    Family History  Problem Relation Age of Onset  . Breast cancer Mother   . Colon cancer Maternal Grandfather   . Colon cancer Brother     Social History   Socioeconomic History  . Marital status: Married    Spouse name: Not on file  . Number of children: Not on file  . Years of education: Not on file  . Highest education level: Not on file  Social Needs  . Financial resource strain: Not on file  . Food insecurity - worry: Not on file  . Food insecurity - inability: Not on file  . Transportation needs - medical: Not on file  . Transportation needs - non-medical: Not on file  Occupational History  . Not on file  Tobacco Use  . Smoking status: Former Smoker    Types: Cigarettes  . Smokeless tobacco: Never Used  Substance and Sexual Activity  . Alcohol use: Yes    Alcohol/week: 0.0 oz    Comment: Occasional gin and tonic  . Drug use: No  . Sexual activity: Not on file  Other Topics Concern  . Not on file  Social History Narrative  . Not on file   Review of systems: Review of Systems  Constitutional: Negative for fever and chills.  HENT: Negative.   Eyes: Negative for blurred vision.  Respiratory: as per HPI  Cardiovascular: Negative for chest pain and palpitations.  Gastrointestinal: Negative for vomiting, diarrhea, blood per rectum. Genitourinary: Negative for dysuria, urgency, frequency and hematuria.  Musculoskeletal: Negative for myalgias, back pain and joint pain.  Skin: Negative for itching and rash.  Neurological: Negative for dizziness, tremors, focal weakness, seizures and loss of consciousness.  Endo/Heme/Allergies: Negative for environmental allergies.  Psychiatric/Behavioral: Negative for depression, suicidal ideas and hallucinations.  All other systems reviewed and are negative.  Physical Exam: Blood  pressure 132/78, pulse (!) 110, height 6\' 2"  (1.88 m), weight (!) 314 lb 9.6 oz (142.7 kg), SpO2 94 %. Gen:      No acute distress HEENT:  EOMI, sclera anicteric Neck:     No masses; no thyromegaly Lungs:    Clear to auscultation bilaterally; normal respiratory effort CV:         Regular rate and rhythm; no murmurs Abd:      + bowel sounds; soft, non-tender; no palpable masses, no distension Ext:    No edema; adequate peripheral perfusion Skin:      Warm and dry; no rash Neuro: alert and oriented x 3 Psych: normal mood and affect  Data Reviewed: CT chest 10/03/10-clear lungs CT chest 06/16/12-clear lungs CT chest 07/21/13-clear lungs CT chest  07/03/15-scattered groundglass opacity with nodularity CT chest 08/30/15-interval improvement in groundglass opacities with nodularity CT chest 10/23/15-increase in diffuse patchy groundglass opacities with centrilobular nodules CT chest 12/19/15-resolution of previously noted lung opacities I have reviewed the images personally  Assessment:  Dyspnea on exertion Suspect he may have emphysema from his smoking.  Will schedule for pulmonary function test and give him Symbicort inhaler. Schedule CXR.  Upper airway cough States that cough has resolved but still has sinus congestion on occasion. Steroid nasal spray as needed..  He has been given ranitidine but stopped as he does not have overt heartburn symptoms.  Plan/Recommendations: - Start Symbicort - PFTs, chest x-ray  Marshell Garfinkel MD [facility] Pager [phone] 10/24/2017, 2:02 PM  CC: Lemmie Evens, MD

## 2017-10-24 NOTE — Patient Instructions (Signed)
We will schedule you for pulmonary function test.  We will give you Symbicort and start you with a prescription Follow-up in 1-2 months to review the lung function test and response to therapy.

## 2017-10-24 NOTE — Telephone Encounter (Signed)
Spoke with patient. He stated that the Symbicort 160 had been called into the wrong pharmacy. He wished to have this sent to [facility].   Advised patient that I would send this in for him and cancel the RX from [facility]. Patient verbalized understanding. Nothing else needed at time of call.

## 2017-10-27 NOTE — Telephone Encounter (Signed)
Letter mailed to pt about the prescription change.

## 2017-10-27 NOTE — Telephone Encounter (Signed)
LMOM to return call.

## 2017-10-28 ENCOUNTER — Other Ambulatory Visit (HOSPITAL_COMMUNITY): Payer: Medicare Other

## 2017-11-13 ENCOUNTER — Encounter (HOSPITAL_COMMUNITY): Payer: Medicare Other

## 2017-11-27 ENCOUNTER — Ambulatory Visit (INDEPENDENT_AMBULATORY_CARE_PROVIDER_SITE_OTHER)
Admission: RE | Admit: 2017-11-27 | Discharge: 2017-11-27 | Disposition: A | Discharge status: Home / Self Care | Payer: Medicare Other | Source: Ambulatory Visit | Attending: Pulmonary Disease | Admitting: Pulmonary Disease

## 2017-11-27 ENCOUNTER — Ambulatory Visit (INDEPENDENT_AMBULATORY_CARE_PROVIDER_SITE_OTHER): Payer: Medicare Other | Admitting: Pulmonary Disease

## 2017-11-27 ENCOUNTER — Encounter: Payer: Self-pay | Admitting: Pulmonary Disease

## 2017-11-27 ENCOUNTER — Other Ambulatory Visit (INDEPENDENT_AMBULATORY_CARE_PROVIDER_SITE_OTHER): Payer: Medicare Other

## 2017-11-27 VITALS — BP 122/84 | HR 100 | Ht 74.0 in | Wt 298.0 lb

## 2017-11-27 DIAGNOSIS — R059 Cough, unspecified: Secondary | ICD-10-CM

## 2017-11-27 DIAGNOSIS — R0602 Shortness of breath: Secondary | ICD-10-CM

## 2017-11-27 DIAGNOSIS — R05 Cough: Secondary | ICD-10-CM | POA: Diagnosis not present

## 2017-11-27 LAB — PULMONARY FUNCTION TEST
DL/VA % PRED: 69 %
DL/VA: 3.35 ml/min/mmHg/L
DLCO unc % pred: 57 %
DLCO unc: 21.83 ml/min/mmHg
FEF 25-75 Post: 1.8 L/sec
FEF 25-75 Pre: 1.63 L/sec
FEF2575-%Change-Post: 10 %
FEF2575-%Pred-Post: 62 %
FEF2575-%Pred-Pre: 56 %
FEV1-%CHANGE-POST: 1 %
FEV1-%PRED-POST: 74 %
FEV1-%PRED-PRE: 72 %
FEV1-PRE: 2.76 L
FEV1-Post: 2.82 L
FEV1FVC-%Change-Post: 0 %
FEV1FVC-%Pred-Pre: 94 %
FEV6-%Change-Post: 1 %
FEV6-%PRED-PRE: 79 %
FEV6-%Pred-Post: 80 %
FEV6-POST: 3.94 L
FEV6-PRE: 3.87 L
FEV6FVC-%Change-Post: 0 %
FEV6FVC-%PRED-POST: 103 %
FEV6FVC-%Pred-Pre: 103 %
FVC-%CHANGE-POST: 1 %
FVC-%PRED-PRE: 76 %
FVC-%Pred-Post: 78 %
FVC-POST: 4.01 L
FVC-PRE: 3.95 L
POST FEV6/FVC RATIO: 98 %
PRE FEV6/FVC RATIO: 98 %
Post FEV1/FVC ratio: 70 %
Pre FEV1/FVC ratio: 70 %
RV % pred: 126 %
RV: 3.37 L
TLC % PRED: 94 %
TLC: 7.37 L

## 2017-11-27 LAB — CBC WITH DIFFERENTIAL/PLATELET
BASOS PCT: 0.6 % (ref 0.0–3.0)
Basophils Absolute: 0 10*3/uL (ref 0.0–0.1)
EOS ABS: 0.1 10*3/uL (ref 0.0–0.7)
Eosinophils Relative: 0.8 % (ref 0.0–5.0)
HCT: 37.4 % — ABNORMAL LOW (ref 39.0–52.0)
HEMOGLOBIN: 12.3 g/dL — AB (ref 13.0–17.0)
Lymphocytes Relative: 13.9 % (ref 12.0–46.0)
Lymphs Abs: 1.1 10*3/uL (ref 0.7–4.0)
MCHC: 32.8 g/dL (ref 30.0–36.0)
MCV: 85.9 fl (ref 78.0–100.0)
MONO ABS: 1.3 10*3/uL — AB (ref 0.1–1.0)
Monocytes Relative: 16.2 % — ABNORMAL HIGH (ref 3.0–12.0)
Neutro Abs: 5.4 10*3/uL (ref 1.4–7.7)
Neutrophils Relative %: 68.5 % (ref 43.0–77.0)
Platelets: 330 10*3/uL (ref 150.0–400.0)
RBC: 4.36 Mil/uL (ref 4.22–5.81)
RDW: 14.5 % (ref 11.5–15.5)
WBC: 7.9 10*3/uL (ref 4.0–10.5)

## 2017-11-27 MED ORDER — UMECLIDINIUM-VILANTEROL 62.5-25 MCG/INH IN AEPB: 1.0000 | INHALATION_SPRAY | Freq: Every day | RESPIRATORY_TRACT | 3 refills | Status: AC

## 2017-11-27 MED ORDER — UMECLIDINIUM-VILANTEROL 62.5-25 MCG/INH IN AEPB: 1.0000 | INHALATION_SPRAY | Freq: Every day | RESPIRATORY_TRACT | 0 refills | Status: AC

## 2017-11-27 MED ORDER — UMECLIDINIUM-VILANTEROL 62.5-25 MCG/INH IN AEPB: 1.0000 | INHALATION_SPRAY | Freq: Every day | RESPIRATORY_TRACT | 3 refills | Status: DC

## 2017-11-27 NOTE — Patient Instructions (Signed)
We will schedule you for a chest x-ray today, CBC differential and blood allergy profile We will start you on anoro instead of Symbicort Follow-up in 3 months.

## 2017-11-27 NOTE — Progress Notes (Signed)
[name]    [id number]    May 20, 1946  Primary Care Physician:Knowlton, Richardson Landry, MD  Referring Physician: Lemmie Evens, MD [place],  [id number]  Chief complaint:  Follow up for abnormal CT, Upper airway cough syndrome.   HPI: 72 year old with past medical history of colon cancer, chronic kidney disease, followed by Dr. Murlean Iba He had bilateral groundglass opacities with nodular infiltrate in 2016 on CT scans done as a part of annual follow-up for colon cancer This was treated with multiple rounds of antibiotics.  Bronchoscopy was considered but deferred as he improved and his last CT scan in March 2017 shows clear lungs He is also being followed up for chronic cough thought to be secondary to postnasal drip.  He is on steroid nasal spray which he uses intermittently  Complains of worsening dyspnea on exertion over the past year.  He does not have symptoms at rest.  Denies any cough, sputum production, fevers, chills.  Reports that his breathing gets better when he starts using the steroid nasal spray.  Pets: Dog, No cats birds Occupation: Retired from [employer]er, [employer], [employer] Exposures: No known exposures. No mold, hot tub Smoking history: Quit in 2010. 40 pack year.  Travel History: No significant.   Interim history: Started on Symbicort at last visit for dyspnea.  Reports that his breathing actually got worse but cannot tell if it is his Lexapro or the inhaler that was causing it.  He stopped Symbicort 1 week ago and reports marginal improvement in symptoms Still has dyspnea on exertion.  No cough.  No fevers, chills.  Outpatient Encounter Medications as of 11/27/2017  Medication Sig  . CALCIUM PO Take 600 mg by mouth daily.  Marland Kitchen dicyclomine (BENTYL) 20 MG tablet Take 1 tablet (20 mg total) by mouth 2 (two) times daily as needed for spasms.  Marland Kitchen diltiazem (CARDIZEM) 60 MG tablet Take 60 mg by mouth 4 (four) times daily.   . ferrous sulfate 325 (65  FE) MG tablet Take 325 mg by mouth daily with breakfast.  . fluticasone (FLONASE) 50 MCG/ACT nasal spray Place 2 sprays into both nostrils at bedtime as needed for allergies or rhinitis.  Marland Kitchen gabapentin (NEURONTIN) 300 MG capsule Take 600 mg by mouth 4 (four) times daily.   Marland Kitchen HYDROcodone-acetaminophen (NORCO) 10-325 MG tablet Take 1-2 tablets by mouth 4 (four) times daily as needed for moderate pain.   Marland Kitchen LYRICA 50 MG capsule Take 50-100 mg by mouth 4 (four) times daily as needed (for pain).   . metFORMIN (GLUCOPHAGE) 1000 MG tablet Take 1,000 mg by mouth 2 (two) times daily with a meal.   . Multiple Vitamins-Minerals (COMPLETE MULTIVITAMIN/MINERAL PO) Take 1 tablet by mouth daily.  . Omega-3 Fatty Acids (FISH OIL PO) Take 1 capsule by mouth daily.  . pioglitazone (ACTOS) 45 MG tablet Take 45 mg by mouth daily.    . Probiotic Product (ADVANCED PROBIOTIC PO) Take 1 capsule by mouth 2 (two) times daily.  . simvastatin (ZOCOR) 80 MG tablet Take 40 mg by mouth every other day.   . warfarin (COUMADIN) 5 MG tablet Take 5-10 mg by mouth See admin instructions. Take 10 mg by mouth daily on Monday, Wednesday and Friday. Take 5 mg by mouth daily on all other days  . budesonide-formoterol (SYMBICORT) 160-4.5 MCG/ACT inhaler Inhale 2 puffs into the lungs 2 (two) times daily for 1 day. (Patient not taking: Reported on 11/27/2017)  . [DISCONTINUED] budesonide-formoterol (  SYMBICORT) 160-4.5 MCG/ACT inhaler Inhale 2 puffs into the lungs 2 (two) times daily for 1 day.   Facility-Administered Encounter Medications as of 11/27/2017  Medication  . sodium chloride 0.9 % injection 10 mL  . sodium chloride flush (NS) 0.9 % injection 10 mL  . sodium chloride flush (NS) 0.9 % injection 10 mL    Allergies as of 11/27/2017  . (No Known Allergies)    Past Medical History:  Diagnosis Date  . Adenocarcinoma of colon ([place]) 2010   Stage IIIB (T3, N2a, M0), colectomy and chemotherapy with FOLFOX  . Chronic atrial  fibrillation ([place])   . CKD (chronic kidney disease) stage 2, GFR 60-89 ml/min   . Essential hypertension   . Mixed hyperlipidemia   . Neuropathy   . Port catheter in place   . 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 Thoracic aortic aneurysm ([place])   . Type 2 diabetes mellitus ([place])     Past Surgical History:  Procedure Laterality Date  . COLECTOMY    . COLONOSCOPY N/A 02/17/2015   Procedure: COLONOSCOPY;  Surgeon: Danie Binder, MD;  Location: [facility];  Service: Endoscopy;  Laterality: N/A;  1400 via colostomy - moved to 2:45 - office to notify  . NEPHRECTOMY    . PORT-A-CATH REMOVAL N/A 10/17/2017   Procedure: MINOR REMOVAL PORT-A-CATH;  Surgeon: Aviva Signs, MD;  Location: [facility] ORS;  Service: General;  Laterality: N/A;    Family History  Problem Relation Age of Onset  . Breast cancer Mother   . Colon cancer Maternal Grandfather   . Colon cancer Brother     Social History   Socioeconomic History  . Marital status: Married    Spouse name: Not on file  . Number of children: Not on file  . Years of education: Not on file  . Highest education level: Not on file  Social Needs  . Financial resource strain: Not on file  . Food insecurity - worry: Not on file  . Food insecurity - inability: Not on file  . Transportation needs - medical: Not on file  . Transportation needs - non-medical: Not on file  Occupational History  . Not on file  Tobacco Use  . Smoking status: Former Smoker    Types: Cigarettes  . Smokeless tobacco: Never Used  Substance and Sexual Activity  . Alcohol use: Yes    Alcohol/week: 0.0 oz    Comment: Occasional gin and tonic  . Drug use: No  . Sexual activity: Not on file  Other Topics Concern  . Not on file  Social History Narrative  . Not on file   Review of systems: Review of Systems  Constitutional: Negative for fever and chills.  HENT: Negative.   Eyes: Negative for blurred vision.  Respiratory:  as per HPI  Cardiovascular: Negative for chest pain and palpitations.  Gastrointestinal: Negative for vomiting, diarrhea, blood per rectum. Genitourinary: Negative for dysuria, urgency, frequency and hematuria.  Musculoskeletal: Negative for myalgias, back pain and joint pain.  Skin: Negative for itching and rash.  Neurological: Negative for dizziness, tremors, focal weakness, seizures and loss of consciousness.  Endo/Heme/Allergies: Negative for environmental allergies.  Psychiatric/Behavioral: Negative for depression, suicidal ideas and hallucinations.  All other systems reviewed and are negative.  Physical Exam: Blood pressure 122/84, pulse 100, height 6\' 2"  (1.88 m), weight 298 lb (135.2 kg), SpO2 95 %. Gen:      No acute distress HEENT:  EOMI, sclera anicteric Neck:     No masses; no thyromegaly Lungs:    Clear to auscultation bilaterally; normal respiratory effort CV:         Regular rate and rhythm; no murmurs Abd:      + bowel sounds; soft, non-tender; no palpable masses, no distension Ext:    No edema; adequate peripheral perfusion Skin:      Warm and dry; no rash Neuro: alert and oriented x 3 Psych: normal mood and affect  Data Reviewed: CT chest 10/03/10-clear lungs CT chest 06/16/12-clear lungs CT chest 07/21/13-clear lungs CT chest 07/03/15-scattered groundglass opacity with nodularity CT chest 08/30/15-interval improvement in groundglass opacities with nodularity CT chest 10/23/15-increase in diffuse patchy groundglass opacities with centrilobular nodules CT chest 12/19/15-resolution of previously noted lung opacities I have reviewed the images personally  PFTs 11/27/17 FVC 4.01 [78%], FEV1 2.8 [74%], F/F 70, TLC 94%, DLCO 57% Mild obstruction, moderate diffusion defect.  Assessment:  Mild COPD He has not tolerated Symbicort.  Will try anoro.  Given samples and a prescription for this. History noted for nodular infiltrate of the lung that improved with antibiotics.   Will repeat chest x-ray to make sure this has not recurred.   Although he reports seasonal allergies, suspicion for asthma is low.  We will evaluate with CBC differential, blood allergy profile.  Upper airway cough States that cough has resolved but still has sinus congestion on occasion. Use steroid nasal spray as needed..  He has been given ranitidine but stopped as he does not have overt heartburn symptoms.  Plan/Recommendations: - Stop Symbicort, start Anoro - CBC differential, blood allergy profile, chest x-ray  Marshell Garfinkel MD [facility] Pager [phone] 11/27/2017, 4:22 PM  CC: Lemmie Evens, MD

## 2017-11-27 NOTE — Progress Notes (Signed)
PFT done today. 

## 2017-11-28 LAB — RESPIRATORY ALLERGY PROFILE REGION II ~~LOC~~
Allergen, A. alternata, m6: <0.1 kU/L
Allergen, Comm Silver Birch, t9: <0.1 kU/L
Allergen, P. notatum, m1: <0.1 kU/L
Aspergillus fumigatus, m3: <0.1 kU/L
Box Elder IgE: <0.1 kU/L
CAT DANDER: 0.34 kU/L — AB
CLADOSPORIUM HERBARUM (M2) IGE: <0.1 kU/L
CLASS: 0
CLASS: 0
CLASS: 0
CLASS: 0
CLASS: 0
CLASS: 0
CLASS: 0
CLASS: 0
COMMON RAGWEED (SHORT) (W1) IGE: 0.31 kU/L — ABNORMAL HIGH
Class: 0
Class: 0
Class: 0
Class: 0
Class: 0
Class: 0
Class: 0
Class: 0
Class: 0
Class: 0
Class: 0
Class: 0
Class: 0
Class: 0
Class: 0
Class: 0
D. farinae: <0.1 kU/L
Dog Dander: 0.24 kU/L — ABNORMAL HIGH
Elm IgE: <0.1 kU/L
IgE (Immunoglobulin E), Serum: 659 kU/L — ABNORMAL HIGH (ref <=114)
Johnson Grass: <0.1 kU/L
Pecan/Hickory Tree IgE: <0.1 kU/L
TIMOTHY GRASS: 0.28 kU/L — AB

## 2017-11-28 LAB — INTERPRETATION:

## 2017-12-15 ENCOUNTER — Telehealth: Payer: Self-pay | Admitting: Pulmonary Disease

## 2017-12-15 NOTE — Telephone Encounter (Signed)
Sending letter to pt's mychart to have him contact our office regarding cxr results.

## 2017-12-26 ENCOUNTER — Inpatient Hospital Stay (HOSPITAL_COMMUNITY): Payer: Medicare Other

## 2017-12-26 ENCOUNTER — Other Ambulatory Visit: Payer: Self-pay

## 2017-12-26 ENCOUNTER — Emergency Department (HOSPITAL_COMMUNITY): Payer: Medicare Other

## 2017-12-26 ENCOUNTER — Encounter (HOSPITAL_COMMUNITY): Payer: Self-pay

## 2017-12-26 ENCOUNTER — Inpatient Hospital Stay (HOSPITAL_COMMUNITY)
Admission: EM | Admit: 2017-12-26 | Discharge: 2018-02-28 | DRG: 870 | Disposition: E | Payer: Medicare Other | Attending: Internal Medicine | Admitting: Internal Medicine

## 2017-12-26 DIAGNOSIS — Z789 Other specified health status: Secondary | ICD-10-CM

## 2017-12-26 DIAGNOSIS — N183 Chronic kidney disease, stage 3 unspecified: Secondary | ICD-10-CM | POA: Diagnosis present

## 2017-12-26 DIAGNOSIS — R609 Edema, unspecified: Secondary | ICD-10-CM

## 2017-12-26 DIAGNOSIS — K921 Melena: Secondary | ICD-10-CM | POA: Diagnosis present

## 2017-12-26 DIAGNOSIS — R1110 Vomiting, unspecified: Secondary | ICD-10-CM

## 2017-12-26 DIAGNOSIS — Z933 Colostomy status: Secondary | ICD-10-CM

## 2017-12-26 DIAGNOSIS — R112 Nausea with vomiting, unspecified: Secondary | ICD-10-CM

## 2017-12-26 DIAGNOSIS — N390 Urinary tract infection, site not specified: Secondary | ICD-10-CM | POA: Diagnosis present

## 2017-12-26 DIAGNOSIS — R6510 Systemic inflammatory response syndrome (SIRS) of non-infectious origin without acute organ dysfunction: Secondary | ICD-10-CM | POA: Diagnosis not present

## 2017-12-26 DIAGNOSIS — E872 Acidosis: Secondary | ICD-10-CM | POA: Diagnosis present

## 2017-12-26 DIAGNOSIS — I480 Paroxysmal atrial fibrillation: Secondary | ICD-10-CM | POA: Diagnosis present

## 2017-12-26 DIAGNOSIS — G9340 Encephalopathy, unspecified: Secondary | ICD-10-CM

## 2017-12-26 DIAGNOSIS — Z9911 Dependence on respirator [ventilator] status: Secondary | ICD-10-CM | POA: Diagnosis not present

## 2017-12-26 DIAGNOSIS — E0591 Thyrotoxicosis, unspecified with thyrotoxic crisis or storm: Secondary | ICD-10-CM | POA: Diagnosis present

## 2017-12-26 DIAGNOSIS — J690 Pneumonitis due to inhalation of food and vomit: Secondary | ICD-10-CM

## 2017-12-26 DIAGNOSIS — Z79899 Other long term (current) drug therapy: Secondary | ICD-10-CM

## 2017-12-26 DIAGNOSIS — F22 Delusional disorders: Secondary | ICD-10-CM | POA: Diagnosis present

## 2017-12-26 DIAGNOSIS — D696 Thrombocytopenia, unspecified: Secondary | ICD-10-CM | POA: Diagnosis not present

## 2017-12-26 DIAGNOSIS — G92 Toxic encephalopathy: Secondary | ICD-10-CM | POA: Diagnosis present

## 2017-12-26 DIAGNOSIS — Z4659 Encounter for fitting and adjustment of other gastrointestinal appliance and device: Secondary | ICD-10-CM

## 2017-12-26 DIAGNOSIS — R7989 Other specified abnormal findings of blood chemistry: Secondary | ICD-10-CM

## 2017-12-26 DIAGNOSIS — I4891 Unspecified atrial fibrillation: Secondary | ICD-10-CM | POA: Diagnosis not present

## 2017-12-26 DIAGNOSIS — R6520 Severe sepsis without septic shock: Secondary | ICD-10-CM | POA: Diagnosis not present

## 2017-12-26 DIAGNOSIS — G9341 Metabolic encephalopathy: Secondary | ICD-10-CM | POA: Diagnosis not present

## 2017-12-26 DIAGNOSIS — Z7901 Long term (current) use of anticoagulants: Secondary | ICD-10-CM | POA: Diagnosis not present

## 2017-12-26 DIAGNOSIS — E1165 Type 2 diabetes mellitus with hyperglycemia: Secondary | ICD-10-CM | POA: Diagnosis present

## 2017-12-26 DIAGNOSIS — W19XXXA Unspecified fall, initial encounter: Secondary | ICD-10-CM | POA: Diagnosis present

## 2017-12-26 DIAGNOSIS — Z9049 Acquired absence of other specified parts of digestive tract: Secondary | ICD-10-CM

## 2017-12-26 DIAGNOSIS — J449 Chronic obstructive pulmonary disease, unspecified: Secondary | ICD-10-CM | POA: Diagnosis present

## 2017-12-26 DIAGNOSIS — I6340 Cerebral infarction due to embolism of unspecified cerebral artery: Secondary | ICD-10-CM

## 2017-12-26 DIAGNOSIS — G8194 Hemiplegia, unspecified affecting left nondominant side: Secondary | ICD-10-CM | POA: Diagnosis not present

## 2017-12-26 DIAGNOSIS — Z515 Encounter for palliative care: Secondary | ICD-10-CM | POA: Diagnosis not present

## 2017-12-26 DIAGNOSIS — E039 Hypothyroidism, unspecified: Secondary | ICD-10-CM | POA: Diagnosis present

## 2017-12-26 DIAGNOSIS — Z7189 Other specified counseling: Secondary | ICD-10-CM

## 2017-12-26 DIAGNOSIS — I6200 Nontraumatic subdural hemorrhage, unspecified: Secondary | ICD-10-CM | POA: Diagnosis not present

## 2017-12-26 DIAGNOSIS — Z7984 Long term (current) use of oral hypoglycemic drugs: Secondary | ICD-10-CM

## 2017-12-26 DIAGNOSIS — E119 Type 2 diabetes mellitus without complications: Secondary | ICD-10-CM

## 2017-12-26 DIAGNOSIS — Z9289 Personal history of other medical treatment: Secondary | ICD-10-CM

## 2017-12-26 DIAGNOSIS — J9601 Acute respiratory failure with hypoxia: Secondary | ICD-10-CM | POA: Diagnosis present

## 2017-12-26 DIAGNOSIS — I130 Hypertensive heart and chronic kidney disease with heart failure and stage 1 through stage 4 chronic kidney disease, or unspecified chronic kidney disease: Secondary | ICD-10-CM | POA: Diagnosis present

## 2017-12-26 DIAGNOSIS — L8990 Pressure ulcer of unspecified site, unspecified stage: Secondary | ICD-10-CM

## 2017-12-26 DIAGNOSIS — I5021 Acute systolic (congestive) heart failure: Secondary | ICD-10-CM | POA: Diagnosis not present

## 2017-12-26 DIAGNOSIS — R310 Gross hematuria: Secondary | ICD-10-CM | POA: Diagnosis present

## 2017-12-26 DIAGNOSIS — I5041 Acute combined systolic (congestive) and diastolic (congestive) heart failure: Secondary | ICD-10-CM | POA: Diagnosis present

## 2017-12-26 DIAGNOSIS — R4182 Altered mental status, unspecified: Secondary | ICD-10-CM | POA: Diagnosis not present

## 2017-12-26 DIAGNOSIS — Z6837 Body mass index (BMI) 37.0-37.9, adult: Secondary | ICD-10-CM

## 2017-12-26 DIAGNOSIS — R0602 Shortness of breath: Secondary | ICD-10-CM

## 2017-12-26 DIAGNOSIS — D62 Acute posthemorrhagic anemia: Secondary | ICD-10-CM | POA: Diagnosis present

## 2017-12-26 DIAGNOSIS — Z7951 Long term (current) use of inhaled steroids: Secondary | ICD-10-CM

## 2017-12-26 DIAGNOSIS — I472 Ventricular tachycardia: Secondary | ICD-10-CM | POA: Diagnosis not present

## 2017-12-26 DIAGNOSIS — E870 Hyperosmolality and hypernatremia: Secondary | ICD-10-CM | POA: Diagnosis not present

## 2017-12-26 DIAGNOSIS — N179 Acute kidney failure, unspecified: Secondary | ICD-10-CM | POA: Diagnosis present

## 2017-12-26 DIAGNOSIS — E0581 Other thyrotoxicosis with thyrotoxic crisis or storm: Secondary | ICD-10-CM | POA: Diagnosis not present

## 2017-12-26 DIAGNOSIS — R195 Other fecal abnormalities: Secondary | ICD-10-CM | POA: Diagnosis present

## 2017-12-26 DIAGNOSIS — I63411 Cerebral infarction due to embolism of right middle cerebral artery: Secondary | ICD-10-CM | POA: Diagnosis not present

## 2017-12-26 DIAGNOSIS — J9690 Respiratory failure, unspecified, unspecified whether with hypoxia or hypercapnia: Secondary | ICD-10-CM

## 2017-12-26 DIAGNOSIS — S065X9A Traumatic subdural hemorrhage with loss of consciousness of unspecified duration, initial encounter: Secondary | ICD-10-CM | POA: Diagnosis present

## 2017-12-26 DIAGNOSIS — E785 Hyperlipidemia, unspecified: Secondary | ICD-10-CM | POA: Diagnosis present

## 2017-12-26 DIAGNOSIS — Z95828 Presence of other vascular implants and grafts: Secondary | ICD-10-CM

## 2017-12-26 DIAGNOSIS — R569 Unspecified convulsions: Secondary | ICD-10-CM | POA: Diagnosis not present

## 2017-12-26 DIAGNOSIS — I509 Heart failure, unspecified: Secondary | ICD-10-CM | POA: Diagnosis not present

## 2017-12-26 DIAGNOSIS — I5031 Acute diastolic (congestive) heart failure: Secondary | ICD-10-CM | POA: Diagnosis not present

## 2017-12-26 DIAGNOSIS — G0490 Encephalitis and encephalomyelitis, unspecified: Secondary | ICD-10-CM | POA: Diagnosis not present

## 2017-12-26 DIAGNOSIS — G4089 Other seizures: Secondary | ICD-10-CM | POA: Diagnosis present

## 2017-12-26 DIAGNOSIS — R0682 Tachypnea, not elsewhere classified: Secondary | ICD-10-CM

## 2017-12-26 DIAGNOSIS — R778 Other specified abnormalities of plasma proteins: Secondary | ICD-10-CM

## 2017-12-26 DIAGNOSIS — I10 Essential (primary) hypertension: Secondary | ICD-10-CM | POA: Diagnosis not present

## 2017-12-26 DIAGNOSIS — E781 Pure hyperglyceridemia: Secondary | ICD-10-CM | POA: Diagnosis present

## 2017-12-26 DIAGNOSIS — B961 Klebsiella pneumoniae [K. pneumoniae] as the cause of diseases classified elsewhere: Secondary | ICD-10-CM | POA: Diagnosis present

## 2017-12-26 DIAGNOSIS — Z8249 Family history of ischemic heart disease and other diseases of the circulatory system: Secondary | ICD-10-CM

## 2017-12-26 DIAGNOSIS — A419 Sepsis, unspecified organism: Secondary | ICD-10-CM | POA: Diagnosis present

## 2017-12-26 DIAGNOSIS — Z85038 Personal history of other malignant neoplasm of large intestine: Secondary | ICD-10-CM | POA: Insufficient documentation

## 2017-12-26 DIAGNOSIS — E669 Obesity, unspecified: Secondary | ICD-10-CM | POA: Diagnosis present

## 2017-12-26 DIAGNOSIS — R110 Nausea: Secondary | ICD-10-CM

## 2017-12-26 DIAGNOSIS — E042 Nontoxic multinodular goiter: Secondary | ICD-10-CM | POA: Diagnosis present

## 2017-12-26 DIAGNOSIS — E874 Mixed disorder of acid-base balance: Secondary | ICD-10-CM | POA: Diagnosis present

## 2017-12-26 DIAGNOSIS — T17908A Unspecified foreign body in respiratory tract, part unspecified causing other injury, initial encounter: Secondary | ICD-10-CM

## 2017-12-26 DIAGNOSIS — J9600 Acute respiratory failure, unspecified whether with hypoxia or hypercapnia: Secondary | ICD-10-CM

## 2017-12-26 DIAGNOSIS — I482 Chronic atrial fibrillation, unspecified: Secondary | ICD-10-CM

## 2017-12-26 DIAGNOSIS — E876 Hypokalemia: Secondary | ICD-10-CM | POA: Diagnosis not present

## 2017-12-26 DIAGNOSIS — Z905 Acquired absence of kidney: Secondary | ICD-10-CM | POA: Diagnosis not present

## 2017-12-26 DIAGNOSIS — Z87891 Personal history of nicotine dependence: Secondary | ICD-10-CM

## 2017-12-26 DIAGNOSIS — Z66 Do not resuscitate: Secondary | ICD-10-CM | POA: Diagnosis not present

## 2017-12-26 DIAGNOSIS — Z781 Physical restraint status: Secondary | ICD-10-CM

## 2017-12-26 DIAGNOSIS — I272 Pulmonary hypertension, unspecified: Secondary | ICD-10-CM | POA: Diagnosis not present

## 2017-12-26 DIAGNOSIS — R0600 Dyspnea, unspecified: Secondary | ICD-10-CM | POA: Diagnosis not present

## 2017-12-26 DIAGNOSIS — I161 Hypertensive emergency: Secondary | ICD-10-CM | POA: Diagnosis present

## 2017-12-26 DIAGNOSIS — E1122 Type 2 diabetes mellitus with diabetic chronic kidney disease: Secondary | ICD-10-CM | POA: Diagnosis present

## 2017-12-26 DIAGNOSIS — R791 Abnormal coagulation profile: Secondary | ICD-10-CM | POA: Diagnosis present

## 2017-12-26 DIAGNOSIS — R748 Abnormal levels of other serum enzymes: Secondary | ICD-10-CM | POA: Diagnosis not present

## 2017-12-26 DIAGNOSIS — G936 Cerebral edema: Secondary | ICD-10-CM | POA: Diagnosis not present

## 2017-12-26 DIAGNOSIS — Z85528 Personal history of other malignant neoplasm of kidney: Secondary | ICD-10-CM

## 2017-12-26 HISTORY — DX: Type 2 diabetes mellitus without complications: E11.9

## 2017-12-26 HISTORY — DX: Long term (current) use of anticoagulants: Z79.01

## 2017-12-26 HISTORY — DX: Essential (primary) hypertension: I10

## 2017-12-26 LAB — LACTIC ACID, PLASMA: LACTIC ACID, VENOUS: 2 mmol/L — AB (ref 0.5–1.9)

## 2017-12-26 LAB — CBG MONITORING, ED: GLUCOSE-CAPILLARY: 151 mg/dL — AB (ref 65–99)

## 2017-12-26 LAB — URINALYSIS, ROUTINE W REFLEX MICROSCOPIC
BILIRUBIN URINE: NEGATIVE
Glucose, UA: NEGATIVE mg/dL
KETONES UR: 20 mg/dL — AB
LEUKOCYTES UA: NEGATIVE
Nitrite: NEGATIVE
Protein, ur: 30 mg/dL — AB
SQUAMOUS EPITHELIAL / LPF: NONE SEEN
Specific Gravity, Urine: 1.021 (ref 1.005–1.030)
pH: 5 (ref 5.0–8.0)

## 2017-12-26 LAB — COMPREHENSIVE METABOLIC PANEL
ALT: 19 U/L (ref 17–63)
ANION GAP: 15 (ref 5–15)
AST: 14 U/L — ABNORMAL LOW (ref 15–41)
Albumin: 3.4 g/dL — ABNORMAL LOW (ref 3.5–5.0)
Alkaline Phosphatase: 64 U/L (ref 38–126)
BUN: 24 mg/dL — ABNORMAL HIGH (ref 6–20)
CHLORIDE: 108 mmol/L (ref 101–111)
CO2: 18 mmol/L — AB (ref 22–32)
Calcium: 10.6 mg/dL — ABNORMAL HIGH (ref 8.9–10.3)
Creatinine, Ser: 1.26 mg/dL — ABNORMAL HIGH (ref 0.61–1.24)
GFR calc non Af Amer: 57 mL/min — ABNORMAL LOW (ref >60)
Glucose, Bld: 164 mg/dL — ABNORMAL HIGH (ref 65–99)
POTASSIUM: 4.9 mmol/L (ref 3.5–5.1)
SODIUM: 141 mmol/L (ref 135–145)
Total Bilirubin: 1.9 mg/dL — ABNORMAL HIGH (ref 0.3–1.2)
Total Protein: 7 g/dL (ref 6.5–8.1)

## 2017-12-26 LAB — POC OCCULT BLOOD, ED: Fecal Occult Bld: POSITIVE — AB

## 2017-12-26 LAB — BASIC METABOLIC PANEL
Anion gap: 14 (ref 5–15)
BUN: 25 mg/dL — AB (ref 6–20)
CO2: 20 mmol/L — ABNORMAL LOW (ref 22–32)
CREATININE: 1.28 mg/dL — AB (ref 0.61–1.24)
Calcium: 10 mg/dL (ref 8.9–10.3)
Chloride: 107 mmol/L (ref 101–111)
GFR calc Af Amer: >60 mL/min (ref >60)
GFR, EST NON AFRICAN AMERICAN: 55 mL/min — AB (ref >60)
Glucose, Bld: 153 mg/dL — ABNORMAL HIGH (ref 65–99)
Potassium: 4.7 mmol/L (ref 3.5–5.1)
SODIUM: 141 mmol/L (ref 135–145)

## 2017-12-26 LAB — TROPONIN I
TROPONIN I: 0.06 ng/mL — AB (ref <0.03)
Troponin I: 0.07 ng/mL (ref <0.03)

## 2017-12-26 LAB — BLOOD GAS, VENOUS
ACID-BASE EXCESS: 21.3 mmol/L — AB (ref 0.0–2.0)
BICARBONATE: 21.3 mmol/L (ref 20.0–28.0)
Drawn by: 22223
O2 Content: 3 L/min
O2 SAT: 68.9 %
PCO2 VEN: 38.2 mmHg — AB (ref 44.0–60.0)
PO2 VEN: 41.5 mmHg (ref 32.0–45.0)
pH, Ven: 7.365 (ref 7.250–7.430)

## 2017-12-26 LAB — CBC WITH DIFFERENTIAL/PLATELET
BASOS ABS: 0 10*3/uL (ref 0.0–0.1)
Basophils Relative: 0 %
Eosinophils Absolute: 0 10*3/uL (ref 0.0–0.7)
Eosinophils Relative: 0 %
HEMATOCRIT: 29.1 % — AB (ref 39.0–52.0)
Hemoglobin: 9.1 g/dL — ABNORMAL LOW (ref 13.0–17.0)
LYMPHS PCT: 9 %
Lymphs Abs: 0.9 10*3/uL (ref 0.7–4.0)
MCH: 26.9 pg (ref 26.0–34.0)
MCHC: 31.3 g/dL (ref 30.0–36.0)
MCV: 86.1 fL (ref 78.0–100.0)
Monocytes Absolute: 0.6 10*3/uL (ref 0.1–1.0)
Monocytes Relative: 7 %
NEUTROS ABS: 8.2 10*3/uL — AB (ref 1.7–7.7)
NEUTROS PCT: 84 %
Platelets: 279 10*3/uL (ref 150–400)
RBC: 3.38 MIL/uL — AB (ref 4.22–5.81)
RDW: 14.9 % (ref 11.5–15.5)
WBC: 9.7 10*3/uL (ref 4.0–10.5)

## 2017-12-26 LAB — RAPID URINE DRUG SCREEN, HOSP PERFORMED
Amphetamines: NOT DETECTED
BARBITURATES: NOT DETECTED
BENZODIAZEPINES: NOT DETECTED
Cocaine: NOT DETECTED
Opiates: POSITIVE — AB
Tetrahydrocannabinol: NOT DETECTED

## 2017-12-26 LAB — AMMONIA: Ammonia: 26 umol/L (ref 9–35)

## 2017-12-26 LAB — PROTIME-INR

## 2017-12-26 LAB — BRAIN NATRIURETIC PEPTIDE: B Natriuretic Peptide: 476 pg/mL — ABNORMAL HIGH (ref 0.0–100.0)

## 2017-12-26 MED ORDER — INSULIN GLARGINE 100 UNIT/ML ~~LOC~~ SOLN
30.0000 [IU] | Freq: Every day | SUBCUTANEOUS | Status: DC
  Administered 2017-12-27: 30 [IU] via SUBCUTANEOUS
  Filled 2017-12-26 (×3): qty 0.3

## 2017-12-26 MED ORDER — ACETAMINOPHEN 650 MG RE SUPP
650.0000 mg | Freq: Four times a day (QID) | RECTAL | Status: DC | PRN
  Administered 2017-12-28 – 2018-01-22 (×8): 650 mg via RECTAL
  Filled 2017-12-26 (×10): qty 1

## 2017-12-26 MED ORDER — POTASSIUM CHLORIDE CRYS ER 20 MEQ PO TBCR
20.0000 meq | EXTENDED_RELEASE_TABLET | Freq: Every day | ORAL | Status: DC
  Administered 2017-12-28: 20 meq via ORAL
  Filled 2017-12-26: qty 1

## 2017-12-26 MED ORDER — DILTIAZEM LOAD VIA INFUSION
20.0000 mg | Freq: Once | INTRAVENOUS | Status: AC
  Administered 2017-12-26: 20 mg via INTRAVENOUS
  Filled 2017-12-26: qty 20

## 2017-12-26 MED ORDER — ACETAMINOPHEN 325 MG RE SUPP
RECTAL | Status: AC
  Administered 2017-12-26: 22:00:00
  Filled 2017-12-26: qty 1

## 2017-12-26 MED ORDER — HALOPERIDOL LACTATE 5 MG/ML IJ SOLN: 5.0000 mg | Freq: Once | INTRAMUSCULAR | Status: DC

## 2017-12-26 MED ORDER — WARFARIN - PHARMACIST DOSING INPATIENT: Status: DC

## 2017-12-26 MED ORDER — LORAZEPAM 2 MG/ML IJ SOLN
1.0000 mg | Freq: Once | INTRAMUSCULAR | Status: AC
Start: 2017-12-26 — End: 2017-12-26
  Administered 2017-12-26: 1 mg via INTRAVENOUS
  Filled 2017-12-26: qty 1

## 2017-12-26 MED ORDER — ACETAMINOPHEN 325 MG PO TABS: 650.0000 mg | ORAL_TABLET | Freq: Once | ORAL | Status: DC

## 2017-12-26 MED ORDER — FUROSEMIDE 10 MG/ML IJ SOLN
40.0000 mg | Freq: Once | INTRAMUSCULAR | Status: AC
  Administered 2017-12-26: 40 mg via INTRAVENOUS
  Filled 2017-12-26: qty 4

## 2017-12-26 MED ORDER — DILTIAZEM HCL-DEXTROSE 100-5 MG/100ML-% IV SOLN (PREMIX)
5.0000 mg/h | INTRAVENOUS | Status: DC
  Administered 2017-12-26 – 2017-12-27 (×2): 5 mg/h via INTRAVENOUS
  Filled 2017-12-26: qty 100

## 2017-12-26 MED ORDER — SODIUM CHLORIDE 0.9 % IV SOLN
2000.0000 mg | Freq: Once | INTRAVENOUS | Status: AC
  Administered 2017-12-27: 2000 mg via INTRAVENOUS
  Filled 2017-12-26 (×2): qty 2000

## 2017-12-26 MED ORDER — HALOPERIDOL LACTATE 5 MG/ML IJ SOLN
INTRAMUSCULAR | Status: AC
  Administered 2017-12-26: 20:00:00
  Filled 2017-12-26: qty 1

## 2017-12-26 MED ORDER — HALOPERIDOL LACTATE 5 MG/ML IJ SOLN
5.0000 mg | Freq: Four times a day (QID) | INTRAMUSCULAR | Status: DC | PRN
  Administered 2017-12-27 – 2018-01-14 (×10): 5 mg via INTRAVENOUS
  Filled 2017-12-26 (×12): qty 1

## 2017-12-26 MED ORDER — PHYTONADIONE 5 MG PO TABS
5.0000 mg | ORAL_TABLET | Freq: Once | ORAL | Status: AC
  Administered 2017-12-26: 5 mg via ORAL
  Filled 2017-12-26: qty 1

## 2017-12-26 MED ORDER — INSULIN ASPART 100 UNIT/ML ~~LOC~~ SOLN: 0.0000 [IU] | Freq: Every day | SUBCUTANEOUS | Status: DC

## 2017-12-26 MED ORDER — FUROSEMIDE 10 MG/ML IJ SOLN
40.0000 mg | Freq: Two times a day (BID) | INTRAMUSCULAR | Status: DC
  Administered 2017-12-27 – 2017-12-29 (×5): 40 mg via INTRAVENOUS
  Filled 2017-12-26 (×5): qty 4

## 2017-12-26 MED ORDER — HALOPERIDOL LACTATE 5 MG/ML IJ SOLN: 5.0000 mg | Freq: Four times a day (QID) | INTRAMUSCULAR | Status: DC | PRN

## 2017-12-26 MED ORDER — ACETAMINOPHEN 325 MG PO TABS
650.0000 mg | ORAL_TABLET | Freq: Four times a day (QID) | ORAL | Status: DC | PRN
  Administered 2017-12-27: 650 mg via ORAL
  Filled 2017-12-26: qty 2

## 2017-12-26 MED ORDER — DILTIAZEM HCL-DEXTROSE 100-5 MG/100ML-% IV SOLN (PREMIX)
5.0000 mg/h | INTRAVENOUS | Status: AC
  Administered 2017-12-27 (×2): 10 mg/h via INTRAVENOUS
  Administered 2017-12-28: 7.5 mg/h via INTRAVENOUS
  Administered 2017-12-28 – 2017-12-29 (×5): 15 mg/h via INTRAVENOUS
  Administered 2017-12-30: 7.5 mg/h via INTRAVENOUS
  Administered 2017-12-30 – 2017-12-31 (×4): 15 mg/h via INTRAVENOUS
  Filled 2017-12-26 (×17): qty 100

## 2017-12-26 MED ORDER — HALOPERIDOL LACTATE 5 MG/ML IJ SOLN
5.0000 mg | Freq: Once | INTRAMUSCULAR | Status: AC
  Administered 2017-12-26: 5 mg via INTRAVENOUS
  Filled 2017-12-26: qty 1

## 2017-12-26 MED ORDER — ONDANSETRON HCL 4 MG/2ML IJ SOLN
4.0000 mg | Freq: Four times a day (QID) | INTRAMUSCULAR | Status: DC | PRN
  Administered 2018-01-19 (×2): 4 mg via INTRAVENOUS
  Filled 2017-12-26 (×2): qty 2

## 2017-12-26 MED ORDER — ONDANSETRON HCL 4 MG PO TABS: 4.0000 mg | ORAL_TABLET | Freq: Four times a day (QID) | ORAL | Status: DC | PRN

## 2017-12-26 MED ORDER — INSULIN ASPART 100 UNIT/ML ~~LOC~~ SOLN
0.0000 [IU] | Freq: Three times a day (TID) | SUBCUTANEOUS | Status: DC
  Administered 2017-12-27 (×2): 3 [IU] via SUBCUTANEOUS
  Administered 2017-12-27: 4 [IU] via SUBCUTANEOUS
  Administered 2017-12-28: 3 [IU] via SUBCUTANEOUS
  Administered 2017-12-28: 4 [IU] via SUBCUTANEOUS
  Administered 2017-12-28: 3 [IU] via SUBCUTANEOUS
  Administered 2017-12-29: 4 [IU] via SUBCUTANEOUS

## 2017-12-26 MED ORDER — PIPERACILLIN-TAZOBACTAM 3.375 G IVPB 30 MIN
3.3750 g | Freq: Once | INTRAVENOUS | Status: AC
  Administered 2017-12-26: 3.375 g via INTRAVENOUS
  Filled 2017-12-26: qty 50

## 2017-12-26 MED ORDER — ACETAMINOPHEN 650 MG RE SUPP
RECTAL | Status: AC
  Administered 2017-12-26: 22:00:00
  Filled 2017-12-26: qty 1

## 2017-12-26 MED ORDER — PIPERACILLIN-TAZOBACTAM 3.375 G IVPB
3.3750 g | Freq: Three times a day (TID) | INTRAVENOUS | Status: AC
  Administered 2017-12-27 – 2018-01-02 (×20): 3.375 g via INTRAVENOUS
  Filled 2017-12-26 (×23): qty 50

## 2017-12-26 MED ORDER — ASPIRIN 81 MG PO CHEW
324.0000 mg | CHEWABLE_TABLET | Freq: Once | ORAL | Status: DC
  Filled 2017-12-26: qty 4

## 2017-12-26 NOTE — Progress Notes (Addendum)
Pharmacy Antibiotic Note  [name] is a 72 y.o. male admitted on 12/01/2017 with sepsis.  Pharmacy has been consulted for Vancomycin and Zosyn  dosing.  Plan: Vancomycin 2000 mg IV every once. Vancomycin 1500mg  IV every 24 hours. Goal trough 15-20 mcg/mL. Zosyn 3.375g IV q8h (4 hour infusion). Monitor labs, micro and vitals.   Weight: 286 lb (129.7 kg)  Temp (24hrs), Avg:99.4 F (37.4 C), Min:97.9 F (36.6 C), Max:102 F (38.9 C)  Recent Labs  Lab 12/05/2017 1615 12/21/2017 2100  WBC 9.7  --   CREATININE 1.26* 1.28*  LATICACIDVEN  --  2.0*    CrCl cannot be calculated (Unknown ideal weight.).    No Known Allergies  Antimicrobials this admission: Vanc 3/29 >>  Zosyn 3/29 >>   Dose adjustments this admission: Obesity/Normalized CrCl Vancomycin dosing protocol will be initiated with an estimated normalized CrCl = 50.7 ml/min.   Microbiology results: 3/29 BCx: pending  UCx:    Sputum:    3/29 MRSA PCR: (-)  Thank you for allowing pharmacy to be a part of this patient's care.  Pricilla Larsson 12/10/2017 10:36 PM

## 2017-12-26 NOTE — ED Notes (Signed)
Patient is very agitated and confused at this time. Very restless, kicking and hitting at staff and wife. Will not keep pulse ox on or cardiac monitoring on also.

## 2017-12-26 NOTE — Progress Notes (Addendum)
Patient Name: [name], male   DOB: [birth year], 72 y.o.  MRN: [id number]  Patient has fever, acute metabolic encephalopathy, and metabolic acidosis (improved from initial) - ? From infection, source unclear at this point. Blood cultures obtained. UA normal. Will start broad spectrum antibiotics as patient symptomatic.   Continue tylenol prn fever, haldol prn agitation.  Truett Mainland, DO 12/05/2017 10:27 PM

## 2017-12-26 NOTE — ED Notes (Signed)
CRITICAL VALUE ALERT  Critical Value:  Lactic 2.0  Date & Time Notied:  11/28/2017 at 2200  Provider Notified: Nehemiah Settle MD   Orders Received/Actions taken: No orders at this time

## 2017-12-26 NOTE — Progress Notes (Signed)
Patient Name: [name], male   DOB: Nov 09, 1945, 72 y.o.  MRN: [id number]  Called to patient's room - patient acutely altered, not oriented to place or time. Pulled out 1 IV.   EDP had given patient Ativan 1mg  IV, which was not effective.  Ordered the following:  BMP, lactic acid, ammonia level, VBG, stat CT head. Haldol 5mg  IV given with good results. Check UA, UDS   Truett Mainland, DO

## 2017-12-26 NOTE — ED Notes (Signed)
Patient keeps stating that he has to pee, but is not able to void.  Patient urinated just now and it dark bloody urine.

## 2017-12-26 NOTE — ED Provider Notes (Addendum)
[facility] EMERGENCY DEPARTMENT Provider Note   CSN: [id number] Arrival date & time: 12/25/2017  1513     History   Chief Complaint Chief Complaint  Patient presents with  . Shortness of Breath    HPI [name] is a 72 y.o. male.  HPI  72 year old male with a history of atrial fibrillation and colon cancer status post colostomy presents with shortness of breath.  He has been feeling short of breath progressively over the 1 month.  Today he had a hard time getting out of bed and walking only a few feet which prompted him to come to the ER.  He mostly notes the shortness of breath on minimal exertion and after a few minutes of sitting down at rest.  Otherwise he feels fine at rest.  There is no chest pain or tightness.  He has had an on and off cough during this time, occasionally with brown or clear sputum.  He has not noticed any hemoptysis or fevers.  No leg swelling or abdominal distention.  He has been losing weight for the last month, he estimates 30 pounds, and this is intentional.  However he has also noticed intermittent nosebleeds and intermittent bleeding from his rectum.  He was placed on Symbicort and Anoro by his PCP with no relief.  Oxygen saturations 89% on room air and he was placed on 2 L nasal cannula oxygen and feels better.  He has not noticed orthopnea.  Past Medical History:  Diagnosis Date  . Atrial fibrillation ([place])   . Cancer ([place])    colon 2010  . Chronic anticoagulation   . Diabetes mellitus without complication ([place])   . Hypertension     There are no active problems to display for this patient.   Past Surgical History:  Procedure Laterality Date  . COLOSTOMY          Home Medications    Prior to Admission medications   Not on File    Family History No family history on file.  Social History Social History   Tobacco Use  . Smoking status: Former Research scientist (life sciences)  . Smokeless tobacco: Never Used  Substance Use Topics  . Alcohol use: Yes      Comment: occ  . Drug use: Never     Allergies   Patient has no known allergies.   Review of Systems Review of Systems  Constitutional: Negative for fever and unexpected weight change.  HENT: Positive for congestion and nosebleeds.   Respiratory: Positive for cough and shortness of breath.   Cardiovascular: Negative for chest pain and leg swelling.  Gastrointestinal: Positive for blood in stool. Negative for abdominal distention, abdominal pain and diarrhea.  All other systems reviewed and are negative.    Physical Exam Updated Vital Signs BP (!) 170/98 (BP Location: Right Arm)   Pulse (!) 119   Temp 97.9 F (36.6 C) (Oral)   Resp (!) 28   Wt 129.7 kg (286 lb)   SpO2 (!) 89% Comment: 2 liters O2 initiated  Physical Exam  Constitutional: He is oriented to person, place, and time. He appears well-developed and well-nourished.  Non-toxic appearance. He does not appear ill. No distress.  obese  HENT:  Head: Normocephalic and atraumatic.  Right Ear: External ear normal.  Left Ear: External ear normal.  Nose: Nose normal.  Eyes: Right eye exhibits no discharge. Left eye exhibits no discharge.  Neck: Neck supple.  Cardiovascular: Normal heart sounds. An irregular rhythm present. Tachycardia present.  Pulmonary/Chest: Effort normal. No accessory muscle usage. Tachypnea noted. He has decreased breath sounds in the right lower field and the left lower field.  Abdominal: Soft. There is no tenderness.  Right sided colostomy  Genitourinary:  Genitourinary Comments: No gross blood on rectal exam Light brown stool No hemorrhoids  Musculoskeletal: He exhibits no edema.       Right lower leg: He exhibits no edema.       Left lower leg: He exhibits no edema.  Neurological: He is alert and oriented to person, place, and time.  Skin: Skin is warm and dry.  Nursing note and vitals reviewed.    ED Treatments / Results  Labs (all labs ordered are listed, but only abnormal  results are displayed) Labs Reviewed  COMPREHENSIVE METABOLIC PANEL - Abnormal; Notable for the following components:      Result Value   CO2 18 (*)    Glucose, Bld 164 (*)    BUN 24 (*)    Creatinine, Ser 1.26 (*)    Calcium 10.6 (*)    Albumin 3.4 (*)    AST 14 (*)    Total Bilirubin 1.9 (*)    GFR calc non Af Amer 57 (*)    All other components within normal limits  BRAIN NATRIURETIC PEPTIDE - Abnormal; Notable for the following components:   B Natriuretic Peptide 476.0 (*)    All other components within normal limits  TROPONIN I - Abnormal; Notable for the following components:   Troponin I 0.06 (*)    All other components within normal limits  CBC WITH DIFFERENTIAL/PLATELET - Abnormal; Notable for the following components:   RBC 3.38 (*)    Hemoglobin 9.1 (*)    HCT 29.1 (*)    Neutro Abs 8.2 (*)    All other components within normal limits  PROTIME-INR - Abnormal; Notable for the following components:   Prothrombin Time >90.0 (*)    INR >10.00 (*)    All other components within normal limits  POC OCCULT BLOOD, ED - Abnormal; Notable for the following components:   Fecal Occult Bld POSITIVE (*)    All other components within normal limits  PROTIME-INR  URINALYSIS, ROUTINE W REFLEX MICROSCOPIC    EKG EKG Interpretation  Date/Time:  Friday December 26 2017 16:44:18 EDT Ventricular Rate:  120 PR Interval:    QRS Duration: 96 QT Interval:  292 QTC Calculation: 413 R Axis:   69 Text Interpretation:  Atrial fibrillation with RVR Ventricular premature complex No old tracing to compare Confirmed by Sherwood Gambler [id number]) on 12/05/2017 5:07:17 PM   Radiology Dg Chest 2 View  Result Date: 12/25/2017 CLINICAL DATA:  Dyspnea and hypoxia. No history of fever and no leukocytosis. EXAM: CHEST - 2 VIEW COMPARISON:  None. FINDINGS: Borderline cardiomegaly. Bilateral Kerley lines and fissure thickening. No air bronchogram or pleural effusion. Aortic tortuosity. IMPRESSION: Few  Kerley lines and mild fissure thickening suggesting early CHF. Electronically Signed   By: Monte Fantasia M.D.   On: 11/30/2017 16:42    Procedures .Critical Care Performed by: Sherwood Gambler, MD Authorized by: Sherwood Gambler, MD   Critical care provider statement:    Critical care time (minutes):  35   Critical care time was exclusive of:  Separately billable procedures and treating other patients   Critical care was necessary to treat or prevent imminent or life-threatening deterioration of the following conditions:  Circulatory failure, respiratory failure and cardiac failure   Critical care was time spent personally by  me on the following activities:  Development of treatment plan with patient or surrogate, discussions with consultants, evaluation of patient's response to treatment, examination of patient, obtaining history from patient or surrogate, ordering and performing treatments and interventions, ordering and review of laboratory studies, ordering and review of radiographic studies, pulse oximetry, re-evaluation of patient's condition and review of old charts   (including critical care time)  Medications Ordered in ED Medications  diltiazem (CARDIZEM) 1 mg/mL load via infusion 20 mg (20 mg Intravenous Bolus from Bag 11/30/2017 1845)    And  diltiazem (CARDIZEM) 100 mg in dextrose 5% 169mL (1 mg/mL) infusion (10 mg/hr Intravenous Rate/Dose Change 12/07/2017 1910)  Warfarin - Pharmacist Dosing Inpatient (has no administration in time range)  LORazepam (ATIVAN) injection 1 mg (has no administration in time range)  phytonadione (VITAMIN K) tablet 5 mg (5 mg Oral Given 12/24/2017 1759)  furosemide (LASIX) injection 40 mg (40 mg Intravenous Given 12/22/2017 1817)     Initial Impression / Assessment and Plan / ED Course  I have reviewed the triage vital signs and the nursing notes.  Pertinent labs & imaging results that were available during my care of the patient were reviewed by me and  considered in my medical decision making (see chart for details).     Initially, patient is tachycardic and hypoxic to the high 80s.  He does not normally wear oxygen.  However he is not in distress once placed on supplemental oxygen.  Chest x-ray shows early CHF but no significant pleural effusions or other acute findings such as pneumonia.  He was started on Cardizem for atrial fibrillation with RVR which is likely contributing.  He does not have any gross blood on rectal exam and no epistaxis at this time but he is heme positive on his stool and thus will need further GI workup.  He does not appear to have worsening respiratory failure that would need BiPAP or intubation.  He was found to have a supratherapeutic INR over 10.  He is not currently having any bleeding and thus will give oral vitamin K and hold Coumadin.  He denies any headache.  While in the ED he started acting "restless" and while he was not confused or having a headache he was just feeling that he could not sit still.  He will be given a small dose of Ativan and a urinalysis will be checked.  However given he is not altered or vomiting with a headache I do not think he has a head bleed and do not think head CT is needed.  Admit to the hospitalist in the stepdown unit.  ADDENDUM: Patient is still in the ER and becoming progressively more confused.  Ativan did not seem to help.  He is more restless and now seems altered.  I does not have a headache, head CT will be obtained given the supratherapeutic INR.  We will also check other lab work.  Dr. Nehemiah Settle updated and would like to give 5 mg IV Haldol.  Patient still seems to be protecting his airway.  Final Clinical Impressions(s) / ED Diagnoses   Final diagnoses:  Atrial fibrillation with RVR ([place])  Acute respiratory failure with hypoxia (HCC)  Acute congestive heart failure, unspecified heart failure type ([place])  Supratherapeutic INR    ED Discharge Orders    None         Sherwood Gambler, MD 12/06/2017 Remus Loffler    Sherwood Gambler, MD 12/06/2017 2045

## 2017-12-26 NOTE — ED Triage Notes (Signed)
Pt reports SOB for one month which has gotten worse. States he has a productive cough. Pt has used symbicort which did not help went back to [place] and was given anoro which also is not helping

## 2017-12-26 NOTE — ED Notes (Signed)
Ice bags placed to bilateral groin

## 2017-12-26 NOTE — Progress Notes (Signed)
ANTICOAGULATION CONSULT NOT  Pharmacy Consult for Warfarin Indication: atrial fibrillation  No Known Allergies  Patient Measurements: Weight: 286 lb (129.7 kg) Heparin Dosing Weight:    Vital Signs: Temp: 97.9 F (36.6 C) (03/29 1552) Temp Source: Oral (03/29 1552) BP: 170/98 (03/29 1552) Pulse Rate: 119 (03/29 1552)  Labs: Recent Labs    12/22/2017 1615 12/05/2017 1618  HGB 9.1*  --   HCT 29.1*  --   PLT 279  --   LABPROT  --  >90.0*  INR  --  >10.00*  CREATININE 1.26*  --   TROPONINI 0.06*  --     CrCl cannot be calculated (Unknown ideal weight.).   Medical History: Past Medical History:  Diagnosis Date  . Atrial fibrillation ([place])   . Cancer ([place])    colon 2010  . Chronic anticoagulation   . Diabetes mellitus without complication ([place])   . Hypertension     Medications:   (Not in a hospital admission)  Assessment: Okay for Protocol, elevated INR.  Vitamin K given.  Goal of Therapy:  INR 2-3   Plan:  No warfarin today. Daily PT/INR Monitor for signs and symptoms of bleeding.    Pricilla Larsson 12/08/2017,6:14 PM

## 2017-12-26 NOTE — H&P (Addendum)
History and Physical  [name] WFU:[id number] DOB: 1946/09/19 DOA: 12/27/2017  Referring physician: Dr Regenia Skeeter, ED physician PCP: Lemmie Evens, MD  Outpatient Specialists:  Arnoldo Morale: Carin Hock Surg [place]: GI [place]: Pulm [place]: Onc  Patient Coming From: home  Chief Complaint: shortness of breath.  HPI: [name] is a 72 y.o. male with a history of a fib, stage 3 chronic kidney disease, DM2, adenocarcinoma of colon s/p resection and current colostomy, history of renal cell carcinoma s/o resection.  Patient presents with worsening shortness of breath over the past month with dyspnea that worsens with minimal exertion. This prompted him to come to the ER.  Denies shortness of breath at rest.  Positive orthopnea.  No chest pain or tightness.  No other palliating or provoking factors.  He has known atrial fibrillation and is on Coumadin, although there is some difficulty in getting it checked.  Denies hemoptysis, fevers, gross blood in stool.  He has noticed intermittent nosebleeds, possibly inner minute bleeding from rectum.  Emergency Department Course: Mild pulmonary edema with bilateral Kerley B-lines and fissure thickening.  BNP slightly elevated at 476.  Troponin elevated at 0.06.  INR greater than 10.  Review of Systems:   Pt denies any fevers, chills, nausea, vomiting, diarrhea, constipation, abdominal pain, cough, wheezing, palpitations, headache, vision changes, lightheadedness, dizziness, melena, rectal bleeding.  Review of systems are otherwise negative  Past Medical History:  Diagnosis Date  . Atrial fibrillation ([place])   . Cancer ([place])    colon 2010  . Chronic anticoagulation   . Diabetes mellitus without complication ([place])   . Hypertension    Past Surgical History:  Procedure Laterality Date  . COLOSTOMY     Social History:  reports that he has quit smoking. He has never used smokeless tobacco. He reports that he drinks alcohol. He reports that he does not  use drugs. Patient lives at home  No Known Allergies  No family history on file.  Family history of hypertension  Prior to Admission medications   Medication Sig Start Date End Date Taking? Authorizing Provider  ANORO ELLIPTA 62.5-25 MCG/INH AEPB TAKE 1 PUFF BY MOUTH EVERY DAY 11/27/17  Yes [provider]  CALCIUM PO Take by mouth.   Yes [provider]  Cholecalciferol 1000 units tablet Take 1,000 Units by mouth daily.   Yes [provider]  dicyclomine (BENTYL) 20 MG tablet  10/25/17  Yes [provider]  DILTIAZEM HCL PO Take by mouth.   Yes [provider]  HYDROcodone-acetaminophen (NORCO) 10-325 MG tablet Take 1 tablet by mouth. 12/08/17  Yes [provider]  IRON PO Take by mouth.   Yes [provider]  LYRICA 100 MG capsule Take 1 capsule by mouth 3 (three) times daily. 11/08/17  Yes [provider]  METFORMIN HCL PO Take by mouth.   Yes [provider]  Omega-3 Fatty Acids (FISH OIL PO) Take by mouth.   Yes [provider]  pioglitazone (ACTOS) 45 MG tablet Take by mouth daily.    Yes [provider]  Probiotic Product (PROBIOTIC PO) Take by mouth.   Yes [provider]  SIMVASTATIN PO Take by mouth.   Yes [provider]  WARFARIN SODIUM PO Take by mouth.   Yes [provider]    Physical Exam: BP (!) 170/98 (BP Location: Right Arm)   Pulse (!) 119   Temp 97.9 F (36.6 C) (Oral)   Resp (!) 28  Wt 129.7 kg (286 lb)   SpO2 (!) 89% Comment: 2 liters O2 initiated  General: Elderly Caucasian male. Awake and alert and oriented x3. No acute cardiopulmonary distress.  HEENT: Normocephalic atraumatic.  Right and left ears normal in appearance.  Pupils equal, round, reactive to light. Extraocular muscles are intact. Sclerae anicteric and noninjected.  Moist mucosal membranes. No mucosal lesions.  Neck: Neck supple without lymphadenopathy. No carotid bruits. No  masses palpated.  Cardiovascular: Irregular tachycardic rate. No murmurs, rubs, gallops auscultated. No JVD.  Respiratory: Mild rales in the bases.  No accessory muscle use. Abdomen: Soft, nontender, nondistended. Active bowel sounds. No masses or hepatosplenomegaly.  Colostomy in place Skin: No rashes, lesions, or ulcerations.  Dry, warm to touch. 2+ dorsalis pedis and radial pulses. Musculoskeletal: No calf or leg pain. All major joints not erythematous nontender.  No upper or lower joint deformation.  Good ROM.  No contractures  Psychiatric: Intact judgment and insight. Pleasant and cooperative. Neurologic: No focal neurological deficits. Strength is 5/5 and symmetric in upper and lower extremities.  Cranial nerves II through XII are grossly intact.           Labs on Admission: I have personally reviewed following labs and imaging studies  CBC: Recent Labs  Lab 12/02/2017 1615  WBC 9.7  NEUTROABS 8.2*  HGB 9.1*  HCT 29.1*  MCV 86.1  PLT 762   Basic Metabolic Panel: Recent Labs  Lab 12/04/2017 1615  NA 141  K 4.9  CL 108  CO2 18*  GLUCOSE 164*  BUN 24*  CREATININE 1.26*  CALCIUM 10.6*   GFR: CrCl cannot be calculated (Unknown ideal weight.). Liver Function Tests: Recent Labs  Lab 12/28/2017 1615  AST 14*  ALT 19  ALKPHOS 64  BILITOT 1.9*  PROT 7.0  ALBUMIN 3.4*   No results for input(s): LIPASE, AMYLASE in the last 168 hours. No results for input(s): AMMONIA in the last 168 hours. Coagulation Profile: Recent Labs  Lab 12/04/2017 1618  INR >10.00*   Cardiac Enzymes: Recent Labs  Lab 12/25/2017 1615  TROPONINI 0.06*   BNP (last 3 results) No results for input(s): PROBNP in the last 8760 hours. HbA1C: No results for input(s): HGBA1C in the last 72 hours. CBG: No results for input(s): GLUCAP in the last 168 hours. Lipid Profile: No results for input(s): CHOL, HDL, LDLCALC, TRIG, CHOLHDL, LDLDIRECT in the last 72 hours. Thyroid Function Tests: No results  for input(s): TSH, T4TOTAL, FREET4, T3FREE, THYROIDAB in the last 72 hours. Anemia Panel: No results for input(s): VITAMINB12, FOLATE, FERRITIN, TIBC, IRON, RETICCTPCT in the last 72 hours. Urine analysis: No results found for: COLORURINE, APPEARANCEUR, LABSPEC, PHURINE, GLUCOSEU, HGBUR, BILIRUBINUR, KETONESUR, PROTEINUR, UROBILINOGEN, NITRITE, LEUKOCYTESUR Sepsis Labs: @LABRCNTIP (procalcitonin:4,lacticidven:4) )No results found for this or any previous visit (from the past 240 hour(s)).   Radiological Exams on Admission: Dg Chest 2 View  Result Date: 12/21/2017 CLINICAL DATA:  Dyspnea and hypoxia. No history of fever and no leukocytosis. EXAM: CHEST - 2 VIEW COMPARISON:  None. FINDINGS: Borderline cardiomegaly. Bilateral Kerley lines and fissure thickening. No air bronchogram or pleural effusion. Aortic tortuosity. IMPRESSION: Few Kerley lines and mild fissure thickening suggesting early CHF. Electronically Signed   By: Monte Fantasia M.D.   On: 12/22/2017 16:42    EKG: Independently reviewed.  Atrial fibrillation with RVR  Assessment/Plan: Principal Problem:   Atrial fibrillation with RVR (HCC) Active Problems:   Hypertension   Diabetes mellitus without complication ([facility])   Stage 3 chronic kidney  disease ([place])   Acquired solitary kidney   Supratherapeutic INR   Heme positive stool   Acute systolic CHF (congestive heart failure) (HCC)   Elevated troponin    This patient was discussed with the ED physician, including pertinent vitals, physical exam findings, labs, and imaging.  We also discussed care given by the ED provider.  1. Atrial fibrillation with RVR 1. Admit to stepdown 2. Cardizem drip 3. TSH 4. Echocardiogram 5. Hold Coumadin due to supratherapeutic INR 6. Recheck INR daily 2. Supratherapeutic INR. 1. Vitamin K given in ED 2. Recheck INR tomorrow 3. Hold Coumadin 4. Pharmacy consult for Coumadin management 3. Acute systolic heart failure Likely secondary to  A. fib with RVR Telemetry monitoring Strict I/O Daily Weights Diuresis: Lasix 40 mg twice daily Potassium: 20 mEq daily by mouth Echo cardiac exam tomorrow Repeat BMP tomorrow 4. Diabetes type 2 1. Hold metformin and pioglitazone 2. Lantus 30 mg daily 3. CBGs before meals and nightly 4. Sliding scale insulin 5. Acquired solitary kidney 6. Stage III chronic kidney disease 1. We will watch creatinine closely 7. Heme positive stool 1. Consult GI 8. Elevated troponin 1. Likely secondary to A. fib 2. Trend troponin   DVT prophylaxis: Supratherapeutic INR Consultants: GI Code Status: Full code Family Communication: Wife in the room Disposition Plan: Patient to return home following admission   Tandi Hanko [place] [facility] Pager [phone]  If 7PM-7AM, please contact night-coverage [email or web address] Password [email or web address]

## 2017-12-27 ENCOUNTER — Inpatient Hospital Stay (HOSPITAL_COMMUNITY): Payer: Medicare Other

## 2017-12-27 DIAGNOSIS — R6520 Severe sepsis without septic shock: Secondary | ICD-10-CM

## 2017-12-27 DIAGNOSIS — I4891 Unspecified atrial fibrillation: Secondary | ICD-10-CM

## 2017-12-27 DIAGNOSIS — Z95828 Presence of other vascular implants and grafts: Secondary | ICD-10-CM

## 2017-12-27 DIAGNOSIS — R310 Gross hematuria: Secondary | ICD-10-CM

## 2017-12-27 DIAGNOSIS — I509 Heart failure, unspecified: Secondary | ICD-10-CM

## 2017-12-27 DIAGNOSIS — A419 Sepsis, unspecified organism: Principal | ICD-10-CM

## 2017-12-27 DIAGNOSIS — R748 Abnormal levels of other serum enzymes: Secondary | ICD-10-CM

## 2017-12-27 LAB — ECHOCARDIOGRAM COMPLETE
Height: 69 in
Weight: 4359.82 oz

## 2017-12-27 LAB — BLOOD CULTURE ID PANEL (REFLEXED)
ACINETOBACTER BAUMANNII: NOT DETECTED
CANDIDA ALBICANS: NOT DETECTED
CANDIDA PARAPSILOSIS: NOT DETECTED
Candida glabrata: NOT DETECTED
Candida krusei: NOT DETECTED
Candida tropicalis: NOT DETECTED
ENTEROBACTERIACEAE SPECIES: NOT DETECTED
ENTEROCOCCUS SPECIES: NOT DETECTED
Enterobacter cloacae complex: NOT DETECTED
Escherichia coli: NOT DETECTED
HAEMOPHILUS INFLUENZAE: NOT DETECTED
Klebsiella oxytoca: NOT DETECTED
Klebsiella pneumoniae: NOT DETECTED
Listeria monocytogenes: NOT DETECTED
Methicillin resistance: NOT DETECTED
Neisseria meningitidis: NOT DETECTED
PSEUDOMONAS AERUGINOSA: NOT DETECTED
Proteus species: NOT DETECTED
SERRATIA MARCESCENS: NOT DETECTED
STAPHYLOCOCCUS AUREUS BCID: NOT DETECTED
STREPTOCOCCUS PNEUMONIAE: NOT DETECTED
STREPTOCOCCUS PYOGENES: NOT DETECTED
Staphylococcus species: DETECTED — AB
Streptococcus agalactiae: NOT DETECTED
Streptococcus species: NOT DETECTED

## 2017-12-27 LAB — TROPONIN I
TROPONIN I: 0.15 ng/mL — AB (ref <0.03)
Troponin I: 0.17 ng/mL (ref <0.03)

## 2017-12-27 LAB — BASIC METABOLIC PANEL
ANION GAP: 13 (ref 5–15)
BUN: 31 mg/dL — AB (ref 6–20)
CO2: 24 mmol/L (ref 22–32)
Calcium: 10.2 mg/dL (ref 8.9–10.3)
Chloride: 111 mmol/L (ref 101–111)
Creatinine, Ser: 1.39 mg/dL — ABNORMAL HIGH (ref 0.61–1.24)
GFR calc Af Amer: 58 mL/min — ABNORMAL LOW (ref >60)
GFR calc non Af Amer: 50 mL/min — ABNORMAL LOW (ref >60)
GLUCOSE: 151 mg/dL — AB (ref 65–99)
POTASSIUM: 4.8 mmol/L (ref 3.5–5.1)
Sodium: 148 mmol/L — ABNORMAL HIGH (ref 135–145)

## 2017-12-27 LAB — PROTIME-INR
INR: 8.59
Prothrombin Time: 70.3 seconds — ABNORMAL HIGH (ref 11.4–15.2)

## 2017-12-27 LAB — BLOOD GAS, ARTERIAL
Acid-Base Excess: 1.2 mmol/L (ref 0.0–2.0)
Bicarbonate: 25.7 mmol/L (ref 20.0–28.0)
Drawn by: 277331
O2 CONTENT: 2.5 L/min
O2 SAT: 98.7 %
PATIENT TEMPERATURE: 37
PO2 ART: 115 mmHg — AB (ref 83.0–108.0)
pCO2 arterial: 31.5 mmHg — ABNORMAL LOW (ref 32.0–48.0)
pH, Arterial: 7.496 — ABNORMAL HIGH (ref 7.350–7.450)

## 2017-12-27 LAB — CBC
HCT: 27 % — ABNORMAL LOW (ref 39.0–52.0)
HEMOGLOBIN: 8.4 g/dL — AB (ref 13.0–17.0)
MCH: 26.8 pg (ref 26.0–34.0)
MCHC: 31.1 g/dL (ref 30.0–36.0)
MCV: 86 fL (ref 78.0–100.0)
PLATELETS: 269 10*3/uL (ref 150–400)
RBC: 3.14 MIL/uL — AB (ref 4.22–5.81)
RDW: 14.8 % (ref 11.5–15.5)
WBC: 11.6 10*3/uL — ABNORMAL HIGH (ref 4.0–10.5)

## 2017-12-27 LAB — GLUCOSE, CAPILLARY
GLUCOSE-CAPILLARY: 129 mg/dL — AB (ref 65–99)
Glucose-Capillary: 153 mg/dL — ABNORMAL HIGH (ref 65–99)
Glucose-Capillary: 148 mg/dL — ABNORMAL HIGH (ref 65–99)
Glucose-Capillary: 139 mg/dL — ABNORMAL HIGH (ref 65–99)

## 2017-12-27 LAB — MRSA PCR SCREENING: MRSA by PCR: NEGATIVE

## 2017-12-27 LAB — HEMOGLOBIN AND HEMATOCRIT, BLOOD
HEMATOCRIT: 26.6 % — AB (ref 39.0–52.0)
Hemoglobin: 8.4 g/dL — ABNORMAL LOW (ref 13.0–17.0)

## 2017-12-27 LAB — T4, FREE: FREE T4: 4.39 ng/dL — AB (ref 0.61–1.12)

## 2017-12-27 LAB — LACTIC ACID, PLASMA
LACTIC ACID, VENOUS: 1.1 mmol/L (ref 0.5–1.9)
Lactic Acid, Venous: 2.3 mmol/L (ref 0.5–1.9)

## 2017-12-27 LAB — MAGNESIUM: Magnesium: 1.6 mg/dL — ABNORMAL LOW (ref 1.7–2.4)

## 2017-12-27 LAB — TSH: TSH: 0.001 u[IU]/mL — AB (ref 0.350–4.500)

## 2017-12-27 MED ORDER — CHLORHEXIDINE GLUCONATE CLOTH 2 % EX PADS
6.0000 | MEDICATED_PAD | Freq: Every day | CUTANEOUS | Status: DC
  Administered 2017-12-27 – 2018-01-07 (×11): 6 via TOPICAL

## 2017-12-27 MED ORDER — VANCOMYCIN HCL 10 G IV SOLR
1500.0000 mg | INTRAVENOUS | Status: DC
  Administered 2017-12-28 – 2017-12-31 (×4): 1500 mg via INTRAVENOUS
  Filled 2017-12-27 (×4): qty 1500

## 2017-12-27 MED ORDER — DEXMEDETOMIDINE HCL IN NACL 200 MCG/50ML IV SOLN
0.4000 ug/kg/h | INTRAVENOUS | Status: DC
  Administered 2017-12-27: 0.5 ug/kg/h via INTRAVENOUS
  Administered 2017-12-27: 0.4 ug/kg/h via INTRAVENOUS
  Administered 2017-12-27: 1 ug/kg/h via INTRAVENOUS
  Administered 2017-12-27: 0.5 ug/kg/h via INTRAVENOUS
  Administered 2017-12-27: 1.2 ug/kg/h via INTRAVENOUS
  Administered 2017-12-27: 0.6 ug/kg/h via INTRAVENOUS
  Administered 2017-12-27 (×3): 1.2 ug/kg/h via INTRAVENOUS
  Administered 2017-12-28 (×2): 0.5 ug/kg/h via INTRAVENOUS
  Filled 2017-12-27 (×11): qty 50

## 2017-12-27 MED ORDER — SODIUM CHLORIDE 0.9% FLUSH
10.0000 mL | Freq: Two times a day (BID) | INTRAVENOUS | Status: DC
  Administered 2017-12-28 (×3): 10 mL
  Administered 2017-12-29: 30 mL
  Administered 2017-12-30 – 2018-01-03 (×10): 10 mL
  Administered 2018-01-04: 30 mL
  Administered 2018-01-04 – 2018-01-05 (×2): 10 mL
  Administered 2018-01-05 – 2018-01-06 (×2): 30 mL
  Administered 2018-01-06 – 2018-01-23 (×26): 10 mL
  Administered 2018-01-23: 30 mL
  Administered 2018-01-23 – 2018-01-24 (×3): 10 mL
  Administered 2018-01-25: 20 mL
  Administered 2018-01-26 – 2018-01-28 (×5): 10 mL
  Administered 2018-01-29: 30 mL
  Administered 2018-01-29: 10 mL
  Administered 2018-01-30: 30 mL

## 2017-12-27 MED ORDER — SODIUM CHLORIDE 0.9% FLUSH
10.0000 mL | INTRAVENOUS | Status: DC | PRN
  Administered 2017-12-29 – 2018-01-20 (×2): 10 mL
  Filled 2017-12-27 (×2): qty 40

## 2017-12-27 MED ORDER — HYDROCODONE-ACETAMINOPHEN 5-325 MG PO TABS
1.0000 | ORAL_TABLET | Freq: Four times a day (QID) | ORAL | Status: DC | PRN
Start: 2017-12-27 — End: 2017-12-28
  Administered 2017-12-27: 2 via ORAL
  Filled 2017-12-27: qty 2

## 2017-12-27 MED ORDER — HALOPERIDOL LACTATE 5 MG/ML IJ SOLN
5.0000 mg | Freq: Once | INTRAMUSCULAR | Status: AC
  Administered 2017-12-27: 5 mg via INTRAVENOUS

## 2017-12-27 MED ORDER — DEXMEDETOMIDINE HCL IN NACL 200 MCG/50ML IV SOLN
INTRAVENOUS | Status: AC
  Administered 2017-12-27: 0.4 ug/kg/h via INTRAVENOUS
  Filled 2017-12-27: qty 50

## 2017-12-27 MED ORDER — SODIUM CHLORIDE 0.9 % IV SOLN: Freq: Once | INTRAVENOUS | Status: DC

## 2017-12-27 NOTE — Progress Notes (Addendum)
Foley catheter placed for hematuria which appears to be resolving.  Hemoglobin on repeat is 8.4 from 9.1.  Vital signs stable.  Continue to monitor with repeat H&H ordered at 0900. Fever appears to be improving.  Patient has once again developed further agitation, tachycardia, and fever and might be going into DTs as he does have history of alcohol abuse per H&P.  He has not responded well to Haldol and is currently on two-point restraints.  I will order Precedex drip to be initiated.  He also continues to have bleeding from Foley catheter with minimal improvement in INR to 8.59. Will order 2U FFP due to ongoing bleeding for rapid reversal. INR recheck ordered after FFP transfusion.

## 2017-12-27 NOTE — Progress Notes (Signed)
PROGRESS NOTE                                                                                                                                                                                                             Patient Demographics:    [name], is a 72 y.o. male, DOB - 06/03/1946, PRF:[id number]  Admit date - 12/14/2017   Admitting Physician Truett Mainland, DO  Outpatient Primary MD for the patient is Lemmie Evens, MD  LOS - 1  Outpatient Specialists: Dr Vaughan Browner (pulmonary)  Chief Complaint  Patient presents with  . Shortness of Breath       Brief Narrative 72 year old male with history of A. 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  Reportedly patient not getting his INR checked regularly.  Patient reported intermittent nosebleeds as well.  In the ED he was found to be in A. fib with RVR . mild pulmonary edema on chest x-ray with elevated BNP of 476 and troponin of 0.06.  He also has supratherapeutic INR >10.  Patient admitted to stepdown on Cardizem drip and given vitamin K.  Also placed on IV Lasix twice daily.  He was found to have Hemoccult positive stool. Few hours after admission patient became confused and agitated pulling out IV line.  Given IV Ativan without much improvement followed by IV Haldol.  He then became febrile with temperature of 102.3 F and placed on empiric antibiotics for possible pneumonia. UA showed hematuria and urine drug screen positive for opiates only.  Actiq acid was elevated to 2.3 meeting criteria for sepsis.  Foley placed on admission for hematuria.  Patient again became agitated and tachycardic with fever concerning for active DTs given history of alcohol use  and placed on Precedex drip.  2 unit FFP ordered for persistently elevated INR of 8.5.   Subjective:   Patient was tachycardic to 160s this morning.  Somnolent and poorly arousable on my exam this morning.   Assessment  & Plan :    Principal Problem:   Atrial fibrillation with RVR (HCC) Heart rate better controlled on Cardizem drip.  Continue for now.  Suppressed TSH (0.003) and  elevated free T4 (> 4).  Check 2D echo.  Hold Coumadin for supratherapeutic INR, being reversed with FFP. Check 2D echo.  Active Problems: Sepsis ([place]) Fever with tachycardia, elevated lactic acid.  Concern for aspiration pneumonia versus early DTs. On Precedex drip.  Continue empiric vancomycin and Zosyn.  Lactic acid improving.  Follow blood culture.  Acute toxic metabolic encephalopathy Secondary to sepsis versus alcohol withdrawal.  On Precedex drip.  Check ABG and head CT.  Acute pulmonary edema (HCC) Placed on IV Lasix 40 mg twice daily.  Reportedly having dyspnea on exertion for 1-2 months.  Check 2D echo.  Strict I/O.  Hematuria and guaiac positive stool. Possibly contributed by supratherapeutic INR.  Foley in place.  Monitor H&H closely and transfuse as needed.  FFP ordered.  By GI and no acute intervention for now.  Supratherapeutic INR Coumadin on hold and ordered 2 unit FFP.  Monitor INR closely.  Elevated troponin Peaked at 0.17.  Check 2D echo.  Unable to do any cardiac workup at this time     Diabetes mellitus without complication (HCC)  holding home dose Lantus and monitor on sliding scale insulin while patient is n.p.o.     Stage 3 chronic kidney disease ([place])  Renal function appears at baseline.  Hypomagnesemia Replenished  IV access PICC line ordered as patient is on multiple drips and pulling out lines.   Code Status : Full code  Family Communication  : Family at bedside  Disposition Plan  : Currently inpatient  Barriers For Discharge : Active symptoms,  guarded  Consults  : GI  Procedures  : Head CT pending  DVT Prophylaxis  : Supratherapeutic INR  Lab Results  Component Value Date   PLT 269 12/27/2017    Antibiotics  :    Anti-infectives (From admission, onward)   Start     Dose/Rate Route Frequency Ordered Stop   12/28/17 0300  vancomycin (VANCOCIN) 1,500 mg in sodium chloride 0.9 % 500 mL IVPB     1,500 mg 250 mL/hr over 120 Minutes Intravenous Every 24 hours 12/27/17 0934     12/27/17 0600  piperacillin-tazobactam (ZOSYN) IVPB 3.375 g     3.375 g 12.5 mL/hr over 240 Minutes Intravenous Every 8 hours 12/06/2017 2236     11/30/2017 2300  piperacillin-tazobactam (ZOSYN) IVPB 3.375 g     3.375 g 100 mL/hr over 30 Minutes Intravenous  Once 12/22/2017 2236 12/27/17 0031   12/19/2017 2245  vancomycin (VANCOCIN) 2,000 mg in sodium chloride 0.9 % 500 mL IVPB     2,000 mg 250 mL/hr over 120 Minutes Intravenous  Once 11/29/2017 2236 12/27/17 0532        Objective:   Vitals:   12/27/17 0800 12/27/17 0900 12/27/17 1000 12/27/17 1145  BP: (!) 147/92 92/72 120/85   Pulse: 85 91 84 90  Resp: (!) 27 (!) 25 (!) 24 (!) 26  Temp:    99.1 F (37.3 C)  TempSrc:    Axillary  SpO2: 95% 96% 95% 95%  Weight:      Height:        Wt Readings from Last 3 Encounters:  12/27/17 123.6 kg (272 lb 7.8 oz)     Intake/Output Summary (Last 24 hours) at 12/27/2017 1250 Last data filed at 12/27/2017 1231 Gross per 24 hour  Intake 845.12 ml  Output 2800 ml  Net -1954.88 ml     Physical Exam  Gen: Somnolent and poorly arousable HEENT: Pallor present pupils reactive bilaterally, supple neck  Chest: clear b/l, no added sounds CVS: S1 and S2 irregular, no murmurs GI: soft, NT, ND, BS+, colostomy bag in place Musculoskeletal: warm, no edema CNS: Poorly arousable, moving all extremities.    Data Review:    CBC Recent Labs  Lab 12/05/2017 1615 12/27/17 0231 12/27/17 0944  WBC 9.7 11.6*  --   HGB 9.1* 8.4* 8.4*  HCT 29.1* 27.0* 26.6*  PLT  279 269  --   MCV 86.1 86.0  --   MCH 26.9 26.8  --   MCHC 31.3 31.1  --   RDW 14.9 14.8  --   LYMPHSABS 0.9  --   --   MONOABS 0.6  --   --   EOSABS 0.0  --   --   BASOSABS 0.0  --   --     Chemistries  Recent Labs  Lab 12/23/2017 1615 12/27/2017 2100 12/27/17 0231 12/27/17 0742  NA 141 141 148*  --   K 4.9 4.7 4.8  --   CL 108 107 111  --   CO2 18* 20* 24  --   GLUCOSE 164* 153* 151*  --   BUN 24* 25* 31*  --   CREATININE 1.26* 1.28* 1.39*  --   CALCIUM 10.6* 10.0 10.2  --   MG  --   --   --  1.6*  AST 14*  --   --   --   ALT 19  --   --   --   ALKPHOS 64  --   --   --   BILITOT 1.9*  --   --   --    ------------------------------------------------------------------------------------------------------------------ No results for input(s): CHOL, HDL, LDLCALC, TRIG, CHOLHDL, LDLDIRECT in the last 72 hours.  No results found for: HGBA1C ------------------------------------------------------------------------------------------------------------------ Recent Labs    12/27/17 0231  TSH 0.001*   ------------------------------------------------------------------------------------------------------------------ No results for input(s): VITAMINB12, FOLATE, FERRITIN, TIBC, IRON, RETICCTPCT in the last 72 hours.  Coagulation profile Recent Labs  Lab 12/19/2017 1618 12/27/17 0231  INR >10.00* 8.59*    No results for input(s): DDIMER in the last 72 hours.  Cardiac Enzymes Recent Labs  Lab 12/09/2017 2100 12/27/17 0231 12/27/17 0944  TROPONINI 0.07* 0.17* 0.15*   ------------------------------------------------------------------------------------------------------------------    Component Value Date/Time   BNP 476.0 (H) 12/12/2017 1616    Inpatient Medications  Scheduled Meds: . furosemide  40 mg Intravenous BID  . insulin aspart  0-20 Units Subcutaneous TID WC  . insulin aspart  0-5 Units Subcutaneous QHS  . insulin glargine  30 Units Subcutaneous QHS  .  potassium chloride  20 mEq Oral Daily  . Warfarin - Pharmacist Dosing Inpatient   Does not apply Q24H   Continuous Infusions: . sodium chloride    . dexmedetomidine (PRECEDEX) IV infusion 1.2 mcg/kg/hr (12/27/17 1230)  . diltiazem (CARDIZEM) infusion 10 mg/hr (12/27/17 1229)  . piperacillin-tazobactam (ZOSYN)  IV    . [START ON 12/28/2017] vancomycin     PRN Meds:.acetaminophen **OR** acetaminophen, haloperidol lactate, ondansetron **OR** ondansetron (ZOFRAN) IV  Micro Results Recent Results (from the past 240 hour(s))  Culture, blood (routine x 2)     Status: None (Preliminary result)   Collection Time: 12/03/2017  9:00 PM  Result Value Ref Range Status   Specimen Description LEFT ANTECUBITAL  Final   Special Requests   Final    BOTTLES DRAWN AEROBIC ONLY Blood Culture adequate volume   Culture   Final    NO GROWTH < 12 HOURS Performed at [facility]  [facility], [address], [place], [place] [address]    Report Status PENDING  Incomplete  Culture, blood (routine x 2)     Status: None (Preliminary result)   Collection Time: 12/21/2017  9:10 PM  Result Value Ref Range Status   Specimen Description BLOOD LEFT HAND  Final   Special Requests   Final    BOTTLES DRAWN AEROBIC ONLY Blood Culture adequate volume   Culture   Final    NO GROWTH < 12 HOURS Performed at [facility], [address] [place], [place] [address]    Report Status PENDING  Incomplete  MRSA PCR Screening     Status: None   Collection Time: 12/27/17  1:46 AM  Result Value Ref Range Status   MRSA by PCR NEGATIVE NEGATIVE Final    Comment:        The GeneXpert MRSA Assay (FDA approved for NASAL specimens only), is one component of a comprehensive MRSA colonization surveillance program. It is not intended to diagnose MRSA infection nor to guide or monitor treatment for MRSA infections. Performed at [facility], [address] [place],  [address]     Radiology Reports Dg Chest 1 View  Result Date:  12/27/2017 CLINICAL DATA:  PICC line placement.  Atrial fibrillation and fever. EXAM: CHEST  1 VIEW COMPARISON:  12/13/2017 chest radiograph FINDINGS: Cardiomegaly and slightly increasing interstitial/possible airspace opacities noted, RIGHT greater than LEFT. A RIGHT PICC line is present with tip overlying the mid-LOWER SVC. No definite pleural effusion or pneumothorax. IMPRESSION: 1. RIGHT PICC line with tip overlying the mid-LOWER SVC. 2. Slightly increasing interstitial and possible airspace opacities bilaterally. Electronically Signed   By: Margarette Canada M.D.   On: 12/27/2017 11:42   Dg Chest 2 View  Result Date: 12/08/2017 CLINICAL DATA:  Dyspnea and hypoxia. No history of fever and no leukocytosis. EXAM: CHEST - 2 VIEW COMPARISON:  None. FINDINGS: Borderline cardiomegaly. Bilateral Kerley lines and fissure thickening. No air bronchogram or pleural effusion. Aortic tortuosity. IMPRESSION: Few Kerley lines and mild fissure thickening suggesting early CHF. Electronically Signed   By: Monte Fantasia M.D.   On: 12/13/2017 16:42    Time Spent in minutes  35   Nimrod Wendt M.D on 12/27/2017 at 12:50 PM  Between 7am to 7pm - Pager - [phone]  After 7pm go to [email or web address] - password [email or web address]  [facility] -  Office  [phone]

## 2017-12-27 NOTE — Progress Notes (Signed)
CRITICAL VALUE ALERT  Critical Value:  Blood Culture ID- Staph species, Gram + Cocci  Date & Time Notied:  12/27/2017 2000

## 2017-12-27 NOTE — ED Notes (Signed)
Pt wife left phone number to contact:  Vonna Drafts  [phone]

## 2017-12-27 NOTE — Consult Note (Signed)
Referring Provider: No ref. provider found Primary Care Physician:  Lemmie Evens, MD Primary Gastroenterologist:  Barney Drain  Reason for Consultation:  HEME POSITIVE STOOLS   Impression: ADMITTED WITH SOB, NOSE BLEEDS, AND HEMATURIA. SML AMOUNT OF BLOOD IN OSTOMY BAG. INR SUPRA-THERAPEUTIC. PMHx: COLON CANCER. PT UNABLE TO GIVE HISTORY. SEDATED ON PRECEDEX. DIFFERENTIAL DIAGNOSIS INCLUDES FRIABLE OSTOMY SITE, COLON POLYPS, AVMs, & LESS LIKELY COLON CA.   Plan: 1. SUPPORTIVE CARE 2. NO INDICATION FOR ENDOSCOPY AT THIS TIME. 3. PROTONIX DAILY  DISCUSSED WITH DR. [id number].       HPI:  LIMITED DUE TO PT SEDATED ON PRECEDEX. PT UNABLE TO GIVE HISTORY. NURSING/EMR SHOW PT ADMITTED WITH NOSEBLEEDS, BLOOD IN URINE, AND SMALL AMOUNT OF BLOOD IN OSTOMY.  Past Medical History:  Diagnosis Date  . Atrial fibrillation ([place])   . Cancer ([place])    colon 2010  . Chronic anticoagulation   . Diabetes mellitus without complication ([place])   . Hypertension     Past Surgical History:  Procedure Laterality Date  . COLOSTOMY      Prior to Admission medications   Medication Sig      ANORO ELLIPTA 62.5-25 MCG/INH AEPB TAKE 1 PUFF BY MOUTH EVERY DAY      budesonide-formoterol (SYMBICORT) 160-4.5 MCG/ACT inhaler Inhale 2 puffs into the lungs 2 (two) times daily.      CALCIUM PO Take by mouth.      Cholecalciferol 1000 units tablet Take 1,000 Units by mouth daily.      dicyclomine (BENTYL) 20 MG tablet       DILTIAZEM HCL PO Take by mouth.      gabapentin (NEURONTIN) 300 MG capsule       HYDROcodone-acetaminophen (NORCO) 10-325 MG tablet Take 1 tablet by mouth.      IRON PO Take by mouth.      LYRICA 100 MG capsule Take 1 capsule by mouth 3 (three) times daily.      METFORMIN HCL PO Take by mouth.      Omega-3 Fatty Acids (FISH OIL PO) Take by mouth.      pioglitazone (ACTOS) 45 MG tablet Take by mouth daily.       Probiotic Product (PROBIOTIC PO) Take by mouth.      SIMVASTATIN PO  Take by mouth.      WARFARIN SODIUM PO Take by mouth.        Current Facility-Administered Medications  Medication        . 0.9 %  sodium chloride infusion        . acetaminophen (TYLENOL) tablet 650 mg         Or  . acetaminophen (TYLENOL) suppository 650 mg        . dexmedetomidine (PRECEDEX) 200 MCG/50ML (4 mcg/mL) infusion        . diltiazem (CARDIZEM) 100 mg in dextrose 5% 1103mL (1 mg/mL) infusion        . furosemide (LASIX) injection 40 mg        . haloperidol lactate (HALDOL) injection 5 mg        . insulin aspart (novoLOG) injection 0-20 Units        . insulin aspart (novoLOG) injection 0-5 Units        . insulin glargine (LANTUS) injection 30 Units        . ondansetron (ZOFRAN) tablet 4 mg         Or  . ondansetron (ZOFRAN) injection 4 mg        .  piperacillin-tazobactam (ZOSYN) IVPB 3.375 g        . potassium chloride SA (K-DUR,KLOR-CON) CR tablet 20 mEq        . [START ON 12/28/2017] vancomycin (VANCOCIN) 1,500 mg in sodium chloride 0.9 % 500 mL IVPB        . Warfarin - Pharmacist Dosing Inpatient          Allergies as of 11/30/2017  . (No Known Allergies)    No family history on file.   Social History   Socioeconomic History  . Marital status: Married    Spouse name: Not on file  . Number of children: Not on file  . Years of education: Not on file  . Highest education level: Not on file  Occupational History  . Not on file  Social Needs  . Financial resource strain: Not on file  . Food insecurity:    Worry: Not on file    Inability: Not on file  . Transportation needs:    Medical: Not on file    Non-medical: Not on file  Tobacco Use  . Smoking status: Former Research scientist (life sciences)  . Smokeless tobacco: Never Used  Substance and Sexual Activity  . Alcohol use: Yes    Comment: occ  . Drug use: Never  . Sexual activity: Not on file  Lifestyle  . Physical activity:    Days per week: Not on file    Minutes per session: Not on file  . Stress: Not on file   Relationships  . Social connections:    Talks on phone: Not on file    Gets together: Not on file    Attends religious service: Not on file    Active member of club or organization: Not on file    Attends meetings of clubs or organizations: Not on file    Relationship status: Not on file  . Intimate partner violence:    Fear of current or ex partner: Not on file    Emotionally abused: Not on file    Physically abused: Not on file    Forced sexual activity: Not on file  Other Topics Concern  . Not on file  Social History Narrative  . Not on file    Review of Systems: PER HPI OTHERWISE ALL SYSTEMS ARE NEGATIVE.   Vitals: Blood pressure (!) 158/68, pulse 85, temperature (!) 100.5 F (38.1 C), resp. rate (!) 27, height 5\' 9"  (1.753 m), weight 272 lb 7.8 oz (123.6 kg), SpO2 93 %.  Physical Exam: General:   SEDATED, in NAD Head:  Normocephalic and atraumatic. Neck:  Supple; no masses. Lungs:  Clear throughout to auscultation.   No wheezes. No acute distress. Heart:  Regular rate and rhythm; no murmurs. Abdomen:  Soft, nontender and nondistended. No masses noted. Normal bowel sounds, without guarding, and without rebound.   Msk:  Symmetrical without gross deformities. Extremities:  Without edema. Neurologic:  UNABLE TO ASSESS PT SEDATED Cervical Nodes:  No significant cervical adenopathy. Psych:  AGITATED WHEN STIMULATED. UNABLE TO ASSESS mood and affect.   Lab Results: Recent Labs    12/18/2017 1615 12/27/17 0231  WBC 9.7 11.6*  HGB 9.1* 8.4*  HCT 29.1* 27.0*  PLT 279 269   BMET Recent Labs    12/10/2017 2100 12/27/17 0231  NA 141 148*  K 4.7 4.8  CL 107 111  CO2 20* 24  GLUCOSE 153* 151*  BUN 25* 31*  CREATININE 1.28* 1.39*  CALCIUM 10.0 10.2   LFT Recent Labs  12/24/2017 1615  PROT 7.0  ALBUMIN 3.4*  AST 14*  ALT 19  ALKPHOS 64  BILITOT 1.9*     Studies/Results: CT OF HEAD MAR 29-NAICP, pCXR: PICC LINE IN PLACE, OPACITIES R > L   LOS: 1 day    Sandi Fields  12/27/2017, 9:42 AM

## 2017-12-27 NOTE — Progress Notes (Signed)
**Note De-identified  Obfuscation** EKG complete RN notified of results and placed in patient chart 

## 2017-12-27 NOTE — Progress Notes (Signed)
ANTICOAGULATION CONSULT NOT  Pharmacy Consult for Warfarin Indication: atrial fibrillation  No Known Allergies  Patient Measurements: Height: 5\' 9"  (175.3 cm) Weight: 272 lb 7.8 oz (123.6 kg) IBW/kg (Calculated) : 70.7 Heparin Dosing Weight:    Vital Signs: Temp: 100.5 F (38.1 C) (03/30 0742) Temp Source: Oral (03/30 0400) BP: 158/68 (03/30 0700) Pulse Rate: 85 (03/30 0742)  Labs: Recent Labs    12/19/2017 1615 12/25/2017 1618 12/24/2017 2100 12/27/17 0231  HGB 9.1*  --   --  8.4*  HCT 29.1*  --   --  27.0*  PLT 279  --   --  269  LABPROT  --  >90.0*  --  70.3*  INR  --  >10.00*  --  8.59*  CREATININE 1.26*  --  1.28* 1.39*  TROPONINI 0.06*  --  0.07* 0.17*    Estimated Creatinine Clearance: 65.2 mL/min (A) (by C-G formula based on SCr of 1.39 mg/dL (H)).   Medical History: Past Medical History:  Diagnosis Date  . Atrial fibrillation ([place])   . Cancer ([place])    colon 2010  . Chronic anticoagulation   . Diabetes mellitus without complication ([place])   . Hypertension     Medications:  Medications Prior to Admission  Medication Sig Dispense Refill Last Dose  . ANORO ELLIPTA 62.5-25 MCG/INH AEPB TAKE 1 PUFF BY MOUTH EVERY DAY  3   . budesonide-formoterol (SYMBICORT) 160-4.5 MCG/ACT inhaler Inhale 2 puffs into the lungs 2 (two) times daily.     [place] Kitchen CALCIUM PO Take by mouth.     . Cholecalciferol 1000 units tablet Take 1,000 Units by mouth daily.     [place] Kitchen dicyclomine (BENTYL) 20 MG tablet      . DILTIAZEM HCL PO Take by mouth.     . gabapentin (NEURONTIN) 300 MG capsule      . HYDROcodone-acetaminophen (NORCO) 10-325 MG tablet Take 1 tablet by mouth.     . IRON PO Take by mouth.     [place] Kitchen LYRICA 100 MG capsule Take 1 capsule by mouth 3 (three) times daily.     [place] Kitchen METFORMIN HCL PO Take by mouth.     . Omega-3 Fatty Acids (FISH OIL PO) Take by mouth.     . pioglitazone (ACTOS) 45 MG tablet Take by mouth daily.      . Probiotic Product (PROBIOTIC PO) Take by mouth.     [place] Kitchen  SIMVASTATIN PO Take by mouth.     . WARFARIN SODIUM PO Take by mouth.       Assessment: Okay for Protocol, INR remains elevated.  Vitamin K and FFP given.  Hematuria noted.  Hg decrease to 8.4.  Goal of Therapy:  INR 2-3   Plan:  No warfarin today. Daily PT/INR Monitor for signs and symptoms of bleeding.    Biagio Quint R 12/27/2017,9:42 AM

## 2017-12-27 NOTE — Progress Notes (Signed)
*  PRELIMINARY RESULTS* Echocardiogram 2D Echocardiogram has been performed.  Leavy Cella 12/27/2017, 9:22 AM

## 2017-12-27 NOTE — ED Notes (Addendum)
Date and time results received: 12/27/17 0000 (use smartphrase ".now" to insert current time)  Test: Lactic acid Critical Value: 2.3  Name of Provider Notified: Dr. Manuella Ghazi  Orders Received? Or Actions Taken?: No orders received.

## 2017-12-28 ENCOUNTER — Inpatient Hospital Stay (HOSPITAL_COMMUNITY): Payer: Medicare Other

## 2017-12-28 DIAGNOSIS — R6510 Systemic inflammatory response syndrome (SIRS) of non-infectious origin without acute organ dysfunction: Secondary | ICD-10-CM

## 2017-12-28 DIAGNOSIS — D62 Acute posthemorrhagic anemia: Secondary | ICD-10-CM

## 2017-12-28 LAB — CBC
HEMATOCRIT: 23.7 % — AB (ref 39.0–52.0)
Hemoglobin: 7.5 g/dL — ABNORMAL LOW (ref 13.0–17.0)
MCH: 26.6 pg (ref 26.0–34.0)
MCHC: 31.6 g/dL (ref 30.0–36.0)
MCV: 84 fL (ref 78.0–100.0)
PLATELETS: 221 10*3/uL (ref 150–400)
RBC: 2.82 MIL/uL — ABNORMAL LOW (ref 4.22–5.81)
RDW: 14.3 % (ref 11.5–15.5)
WBC: 10 10*3/uL (ref 4.0–10.5)

## 2017-12-28 LAB — ABO/RH: ABO/RH(D): AB POS

## 2017-12-28 LAB — BASIC METABOLIC PANEL
ANION GAP: 12 (ref 5–15)
BUN: 40 mg/dL — ABNORMAL HIGH (ref 6–20)
CALCIUM: 9.4 mg/dL (ref 8.9–10.3)
CO2: 25 mmol/L (ref 22–32)
Chloride: 103 mmol/L (ref 101–111)
Creatinine, Ser: 1.24 mg/dL (ref 0.61–1.24)
GFR, EST NON AFRICAN AMERICAN: 58 mL/min — AB (ref >60)
Glucose, Bld: 167 mg/dL — ABNORMAL HIGH (ref 65–99)
Potassium: 3.6 mmol/L (ref 3.5–5.1)
Sodium: 140 mmol/L (ref 135–145)

## 2017-12-28 LAB — GLUCOSE, CAPILLARY
GLUCOSE-CAPILLARY: 159 mg/dL — AB (ref 65–99)
GLUCOSE-CAPILLARY: 143 mg/dL — AB (ref 65–99)
GLUCOSE-CAPILLARY: 157 mg/dL — AB (ref 65–99)
Glucose-Capillary: 146 mg/dL — ABNORMAL HIGH (ref 65–99)
Glucose-Capillary: 147 mg/dL — ABNORMAL HIGH (ref 65–99)

## 2017-12-28 LAB — HEMOGLOBIN AND HEMATOCRIT, BLOOD
HEMATOCRIT: 26.2 % — AB (ref 39.0–52.0)
HEMOGLOBIN: 8.7 g/dL — AB (ref 13.0–17.0)

## 2017-12-28 LAB — PROTIME-INR
INR: 2.67
Prothrombin Time: 28.2 seconds — ABNORMAL HIGH (ref 11.4–15.2)

## 2017-12-28 LAB — PREPARE RBC (CROSSMATCH)

## 2017-12-28 LAB — MAGNESIUM: Magnesium: 1.3 mg/dL — ABNORMAL LOW (ref 1.7–2.4)

## 2017-12-28 MED ORDER — METOPROLOL TARTRATE 5 MG/5ML IV SOLN
5.0000 mg | Freq: Once | INTRAVENOUS | Status: AC
  Administered 2017-12-28: 5 mg via INTRAVENOUS
  Filled 2017-12-28: qty 5

## 2017-12-28 MED ORDER — PANTOPRAZOLE SODIUM 40 MG IV SOLR: 40.0000 mg | INTRAVENOUS | Status: DC

## 2017-12-28 MED ORDER — PANTOPRAZOLE SODIUM 40 MG PO TBEC
40.0000 mg | DELAYED_RELEASE_TABLET | Freq: Every day | ORAL | Status: DC
  Administered 2017-12-30: 40 mg via ORAL
  Filled 2017-12-28 (×2): qty 1

## 2017-12-28 MED ORDER — SODIUM CHLORIDE 0.9 % IV SOLN: Freq: Once | INTRAVENOUS | Status: DC

## 2017-12-28 MED ORDER — SODIUM CHLORIDE 0.9 % IV SOLN
Freq: Once | INTRAVENOUS | Status: AC
  Administered 2017-12-28: 11:00:00 via INTRAVENOUS

## 2017-12-28 MED ORDER — PANTOPRAZOLE SODIUM 40 MG IV SOLR
40.0000 mg | INTRAVENOUS | Status: DC
Start: 2017-12-28 — End: 2017-12-28
  Administered 2017-12-28: 40 mg via INTRAVENOUS
  Filled 2017-12-28: qty 40

## 2017-12-28 MED ORDER — LEVETIRACETAM IN NACL 1000 MG/100ML IV SOLN
1000.0000 mg | Freq: Two times a day (BID) | INTRAVENOUS | Status: DC
Start: 2017-12-28 — End: 2017-12-28

## 2017-12-28 MED ORDER — LORAZEPAM 2 MG/ML IJ SOLN
2.0000 mg | INTRAMUSCULAR | Status: DC | PRN
  Administered 2018-01-12 – 2018-01-15 (×5): 2 mg via INTRAVENOUS
  Filled 2017-12-28 (×5): qty 1

## 2017-12-28 MED ORDER — LEVETIRACETAM IN NACL 1000 MG/100ML IV SOLN
1000.0000 mg | Freq: Once | INTRAVENOUS | Status: AC
  Administered 2017-12-28: 1000 mg via INTRAVENOUS
  Filled 2017-12-28 (×2): qty 100

## 2017-12-28 MED ORDER — IOPAMIDOL (ISOVUE-300) INJECTION 61%
100.0000 mL | Freq: Once | INTRAVENOUS | Status: AC | PRN
  Administered 2017-12-28: 80 mL via INTRAVENOUS

## 2017-12-28 MED ORDER — LORAZEPAM 2 MG/ML IJ SOLN
INTRAMUSCULAR | Status: AC
  Filled 2017-12-28: qty 1

## 2017-12-28 MED ORDER — VITAMIN K1 10 MG/ML IJ SOLN
10.0000 mg | Freq: Once | INTRAVENOUS | Status: AC
  Administered 2017-12-28: 10 mg via INTRAVENOUS
  Filled 2017-12-28: qty 1

## 2017-12-28 MED ORDER — LEVETIRACETAM IN NACL 1000 MG/100ML IV SOLN
1000.0000 mg | Freq: Two times a day (BID) | INTRAVENOUS | Status: DC
  Administered 2017-12-29 – 2018-01-21 (×47): 1000 mg via INTRAVENOUS
  Filled 2017-12-28 (×50): qty 100

## 2017-12-28 MED ORDER — VITAMIN K1 10 MG/ML IJ SOLN
INTRAMUSCULAR | Status: AC
  Filled 2017-12-28: qty 1

## 2017-12-28 MED ORDER — HYDRALAZINE HCL 20 MG/ML IJ SOLN
10.0000 mg | Freq: Four times a day (QID) | INTRAMUSCULAR | Status: DC | PRN
  Administered 2017-12-28 – 2018-01-23 (×12): 10 mg via INTRAVENOUS
  Filled 2017-12-28 (×12): qty 1

## 2017-12-28 MED ORDER — PANTOPRAZOLE SODIUM 40 MG PO TBEC: 40.0000 mg | DELAYED_RELEASE_TABLET | Freq: Every day | ORAL | Status: DC

## 2017-12-28 MED ORDER — EMPTY CONTAINERS FLEXIBLE MISC
1539.0000 [IU] | Freq: Once | Status: AC
  Administered 2017-12-29: 1539 [IU] via INTRAVENOUS
  Filled 2017-12-28: qty 1539

## 2017-12-28 NOTE — Progress Notes (Addendum)
Patient returned from CT and  had a short onset of tonic-clonic seizures.  Subsided before he could get Ativan.  Noted for right facial deviation and right eye droop which is new.  Moving all extremities. Discussed head CT findings with radiologist and showed bilateral small subdural hematoma more on the right measuring 0.6 cm .[place] Kitchen  Discussed CT findings with neurosurgeon Dr. Annette Stable who recommended the size of the bleeding was very small and likely due to supratherapeutic INR.  Recommended no acute intervention and follow-up head CT tomorrow.    Patient again in rapid A. fib with heart rate in 160s.  Still having temperature spikes. Placed on loading dose of Keppra followed by maintenance dose and ordered for unit FFP to reverse INR completely.  Keep n.p.o. for airway protection. Continue empiric antibiotics.  PRN Ativan for seizures.  [facility] will be [facility] given multiple active issues including rapid A. fib, sepsis, subdural hematoma with new neurological deficit.  Needs MRI of the brain to rule out acute stroke given his new right facial deviation right eye droop (minimal mass-effect without midline shift on CT)  Will need cardiology and neurology consult in am. Also may need urology evaluation depending  on CT finding for ongoing hematuria.  Called wife and left a message.

## 2017-12-28 NOTE — Progress Notes (Addendum)
PROGRESS NOTE                                                                                                                                                                                                             Patient Demographics:    [name], is a 72 y.o. male, DOB - [birth year], OAC:[id number]  Admit date - 12/07/2017   Admitting Physician Truett Mainland, DO  Outpatient Primary MD for the patient is Lemmie Evens, MD  LOS - 2  Outpatient Specialists: Dr Vaughan Browner (pulmonary)  Chief Complaint  Patient presents with  . Shortness of Breath       Brief Narrative 72 year old male with history of A. 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  Reportedly patient not getting his INR checked regularly.  Patient reported intermittent nosebleeds as well.  In the ED he was found to be in A. fib with RVR . mild pulmonary edema on chest x-ray with elevated BNP of 476 and troponin of 0.06.  He also has supratherapeutic INR >10.  Patient admitted to stepdown on Cardizem drip and given vitamin K.  Also placed on IV Lasix twice daily.  He was found to have Hemoccult positive stool. Few hours after admission patient became confused and agitated pulling out IV line.  Given IV Ativan without much improvement followed by IV Haldol.  He then became febrile with temperature of 102.3 F and placed on empiric antibiotics for possible pneumonia. UA showed hematuria and urine drug screen positive for opiates only.  Actiq acid was elevated to 2.3 meeting criteria for sepsis.  Foley placed on admission for hematuria.  Patient again became agitated and tachycardic with fever concerning for active DTs given history of alcohol use  and placed on Precedex drip.  2 unit FFP ordered for persistently elevated INR of 8.5.   Subjective:   Was placed back on precedex drip last night for agitation. Patient awake and eating when I saw him this am. oriented to place. Denies any pain.    Assessment  & Plan :    Principal Problem:   Atrial fibrillation with RVR (HCC) CHADS2vasc of 4. Heart rate mostly  improved but again elevated this afternoon,continue cardizem drip.  Continue for now.  Suppressed TSH (0.003) and elevated free T4 (> 4).  2D with normal EF but akinesis of basal inferior myocardium and severely dilated RA and LA. Marland Kitchen  Holding Coumadin for supratherapeutic INR,  reversed with FFP. Consult cardiology n am.  Active Problems: Sepsis ([facility]) Fever with tachycardia, elevated lactic acid.  Concern for aspiration pneumonia versus early DTs, although both patient and wife inform very occasional drinking.. Still having fevers.  Continue empiric vancomycin and Zosyn.  Lactic acid resolved. Blood cx from admission growing staph species , likely a contaminant.  Check CT abd and pelvis for occult infection / abscess.  Acute toxic metabolic encephalopathy Secondary to sepsis. Awake and better oriented today. D/c precedex. Use haldol prn for agitation. Check CT head. continue wrist restrains.  Acute pulmonary edema (HCC) IV Lasix 40 mg twice daily.  Reportedly having dyspnea on exertion for 1-2 months.   2D echo reviewed.  Hematuria and guaiac positive stool. Possibly contributed by supratherapeutic INR.  Foley in place and passing clots. Check CT abd and pelvis. Drop in h&H noted. Received 2 U FFP and INR better. Ordered 1 U prbc. No further blood in ostomy bag. GI consult appreciated, no inpt w/up for now, continue PPI with plan on colonoscopy in 2 months.  Supratherapeutic INR INR therapeutic after 2 U FFP, hold coumadin  Elevated troponin Peaked at 0.17.   2D echo with basalinferior hypokinesis. May need TEE if no source  of sepsis found. Consult cardiology.  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    Diabetes mellitus without complication ([place])  holding home dose Lantus and monitor on sliding scale insulin.     Stage 3 chronic kidney disease ([place])  Renal function stale at baseline.  Hypomagnesemia Replenished  IV access PICC line placed due to multiple drips and pulling out lines.   Code Status : Full code  Family Communication  : d/w wife on 3/30  Disposition Plan  : Currently inpatient  Barriers For Discharge : Active symptoms, guarded  Consults  : GI  Procedures  : Head CT pending  DVT Prophylaxis  : Supratherapeutic INR  Lab Results  Component Value Date   PLT 221 12/28/2017    Antibiotics  :    Anti-infectives (From admission, onward)   Start     Dose/Rate Route Frequency Ordered Stop   12/28/17 0300  vancomycin (VANCOCIN) 1,500 mg in sodium chloride 0.9 % 500 mL IVPB     1,500 mg 250 mL/hr over 120 Minutes Intravenous Every 24 hours 12/27/17 0934     12/27/17 0600  piperacillin-tazobactam (ZOSYN) IVPB 3.375 g     3.375 g 12.5 mL/hr over 240 Minutes Intravenous Every 8 hours 12/27/2017 2236     11/28/2017 2300  piperacillin-tazobactam (ZOSYN) IVPB 3.375 g     3.375 g 100 mL/hr over 30 Minutes Intravenous  Once 12/19/2017 2236 12/27/17 0031   12/12/2017 2245  vancomycin (VANCOCIN) 2,000 mg in sodium chloride 0.9 % 500 mL IVPB     2,000 mg 250 mL/hr over 120 Minutes Intravenous  Once 12/25/2017 2236 12/27/17 0532        Objective:   Vitals:   12/28/17 1142 12/28/17 1200 12/28/17 1300 12/28/17 1345  BP:  Marland Kitchen)  162/140 (!) 178/85 (!) 178/97  Pulse: (!) 129 (!) 140 (!) 136 (!) 149  Resp: (!) 26 (!) 23 (!) 25 (!) 30  Temp: 98.6 F (37 C)   (!) 101 F (38.3 C)  TempSrc: Oral   Rectal  SpO2: 92% 91%  94% 92%  Weight:      Height:        Wt Readings from Last 3 Encounters:  12/28/17 120.6 kg (265 lb 14 oz)     Intake/Output Summary (Last 24 hours) at 12/28/2017 1445 Last data filed at 12/28/2017 1345 Gross per 24 hour  Intake 2582.17 ml  Output 3250 ml  Net -667.83 ml     Physical Exam Gen: awake but restless HEENT: pallor, moist mucosa, poor dentition, supple neck Chest: clear b/l, no added sounds CVS: S1&S2 irregular , no murmurs GI: soft, NT, ND, colostomy bag empty, no blood, foley with bloody urine in the bag Musculoskeletal: warm, no edema  CNS: AAOX1-2   Data Review:    CBC Recent Labs  Lab 12/18/2017 1615 12/27/17 0231 12/27/17 0944 12/28/17 0410  WBC 9.7 11.6*  --  10.0  HGB 9.1* 8.4* 8.4* 7.5*  HCT 29.1* 27.0* 26.6* 23.7*  PLT 279 269  --  221  MCV 86.1 86.0  --  84.0  MCH 26.9 26.8  --  26.6  MCHC 31.3 31.1  --  31.6  RDW 14.9 14.8  --  14.3  LYMPHSABS 0.9  --   --   --   MONOABS 0.6  --   --   --   EOSABS 0.0  --   --   --   BASOSABS 0.0  --   --   --     Chemistries  Recent Labs  Lab 12/17/2017 1615 12/02/2017 2100 12/27/17 0231 12/27/17 0742 12/28/17 0410  NA 141 141 148*  --  140  K 4.9 4.7 4.8  --  3.6  CL 108 107 111  --  103  CO2 18* 20* 24  --  25  GLUCOSE 164* 153* 151*  --  167*  BUN 24* 25* 31*  --  40*  CREATININE 1.26* 1.28* 1.39*  --  1.24  CALCIUM 10.6* 10.0 10.2  --  9.4  MG  --   --   --  1.6*  --   AST 14*  --   --   --   --   ALT 19  --   --   --   --   ALKPHOS 64  --   --   --   --   BILITOT 1.9*  --   --   --   --    ------------------------------------------------------------------------------------------------------------------ No results for input(s): CHOL, HDL, LDLCALC, TRIG, CHOLHDL, LDLDIRECT in the last 72 hours.  No results found for: HGBA1C ------------------------------------------------------------------------------------------------------------------ Recent Labs    12/27/17 0231  TSH 0.001*    ------------------------------------------------------------------------------------------------------------------ No results for input(s): VITAMINB12, FOLATE, FERRITIN, TIBC, IRON, RETICCTPCT in the last 72 hours.  Coagulation profile Recent Labs  Lab 12/22/2017 1618 12/27/17 0231 12/28/17 0410  INR >10.00* 8.59* 2.67    No results for input(s): DDIMER in the last 72 hours.  Cardiac Enzymes Recent Labs  Lab 12/27/2017 2100 12/27/17 0231 12/27/17 0944  TROPONINI 0.07* 0.17* 0.15*   ------------------------------------------------------------------------------------------------------------------    Component Value Date/Time   BNP 476.0 (H) 12/23/2017 1616    Inpatient Medications  Scheduled Meds: . Chlorhexidine Gluconate Cloth  6 each Topical Daily  . furosemide  40 mg Intravenous BID  . insulin aspart  0-20 Units Subcutaneous TID WC  . insulin aspart  0-5 Units Subcutaneous QHS  . [START ON 12/29/2017] pantoprazole  40 mg Oral QAC breakfast  . potassium chloride  20 mEq Oral Daily  . sodium chloride flush  10-40 mL Intracatheter Q12H   Continuous Infusions: . sodium chloride    . dexmedetomidine (PRECEDEX) IV infusion Stopped (12/28/17 6269)  . diltiazem (CARDIZEM) infusion 10 mg/hr (12/28/17 1334)  . piperacillin-tazobactam (ZOSYN)  IV 3.375 g (12/28/17 1401)  . vancomycin Stopped (12/28/17 0436)   PRN Meds:.acetaminophen **OR** acetaminophen, haloperidol lactate, HYDROcodone-acetaminophen, ondansetron **OR** ondansetron (ZOFRAN) IV, sodium chloride flush  Micro Results Recent Results (from the past 240 hour(s))  Culture, blood (routine x 2)     Status: Abnormal (Preliminary result)   Collection Time: 12/06/2017  9:00 PM  Result Value Ref Range Status   Specimen Description   Final    LEFT ANTECUBITAL Performed at [facility], [address], [place], [place] [address]    Special Requests   Final    BOTTLES DRAWN AEROBIC ONLY Blood Culture adequate  volume Performed at [facility], [address], [place], [place] [address]    Culture  Setup Time   Final    GRAM POSITIVE COCCI Gram Stain Report Called to,Read Back By and Verified With: HYLTON @ 0093 ON [id number] BY HENDERSON L CRITICAL RESULT CALLED TO, READ BACK BY AND VERIFIED WITH: C HOWARD,RN AT 1931 12/27/17 BY L BENFIELD    Culture (A)  Final    STAPHYLOCOCCUS SPECIES (COAGULASE NEGATIVE) THE SIGNIFICANCE OF ISOLATING THIS ORGANISM FROM A SINGLE SET OF BLOOD CULTURES WHEN MULTIPLE SETS ARE DRAWN IS UNCERTAIN. PLEASE NOTIFY THE MICROBIOLOGY DEPARTMENT WITHIN ONE WEEK IF SPECIATION AND SENSITIVITIES ARE REQUIRED. Performed at [facility], [place] [address] [place], [place] [address]    Report Status PENDING  Incomplete  Blood Culture ID Panel (Reflexed)     Status: Abnormal   Collection Time: 12/25/2017  9:00 PM  Result Value Ref Range Status   Enterococcus species NOT DETECTED NOT DETECTED Final   Listeria monocytogenes NOT DETECTED NOT DETECTED Final   Staphylococcus species DETECTED (A) NOT DETECTED Final    Comment: Methicillin (oxacillin) susceptible coagulase negative staphylococcus. Possible blood culture contaminant (unless isolated from more than one blood culture draw or clinical case suggests pathogenicity). No antibiotic treatment is indicated for blood  culture contaminants. CRITICAL RESULT CALLED TO, READ BACK BY AND VERIFIED WITH: C HOWARD,RN AT 1931 12/27/17 BY L BENFIELD    Staphylococcus aureus NOT DETECTED NOT DETECTED Final   Methicillin resistance NOT DETECTED NOT DETECTED Final   Streptococcus species NOT DETECTED NOT DETECTED Final   Streptococcus agalactiae NOT DETECTED NOT DETECTED Final   Streptococcus pneumoniae NOT DETECTED NOT DETECTED Final   Streptococcus pyogenes NOT DETECTED NOT DETECTED Final   Acinetobacter baumannii NOT DETECTED NOT DETECTED Final   Enterobacteriaceae species NOT DETECTED NOT DETECTED Final   Enterobacter cloacae complex  NOT DETECTED NOT DETECTED Final   Escherichia coli NOT DETECTED NOT DETECTED Final   Klebsiella oxytoca NOT DETECTED NOT DETECTED Final   Klebsiella pneumoniae NOT DETECTED NOT DETECTED Final   Proteus species NOT DETECTED NOT DETECTED Final   Serratia marcescens NOT DETECTED NOT DETECTED Final   Haemophilus influenzae NOT DETECTED NOT DETECTED Final   Neisseria meningitidis NOT DETECTED NOT DETECTED Final   Pseudomonas aeruginosa NOT DETECTED NOT DETECTED Final   Candida albicans NOT DETECTED NOT DETECTED  Final   Candida glabrata NOT DETECTED NOT DETECTED Final   Candida krusei NOT DETECTED NOT DETECTED Final   Candida parapsilosis NOT DETECTED NOT DETECTED Final   Candida tropicalis NOT DETECTED NOT DETECTED Final    Comment: Performed at [facility], [place] [address], [place], [place] [address]  Culture, blood (routine x 2)     Status: None (Preliminary result)   Collection Time: 12/13/2017  9:10 PM  Result Value Ref Range Status   Specimen Description BLOOD LEFT HAND  Final   Special Requests   Final    BOTTLES DRAWN AEROBIC ONLY Blood Culture adequate volume   Culture   Final    NO GROWTH 2 DAYS Performed at [facility], [address], [place], [place] [address]    Report Status PENDING  Incomplete  MRSA PCR Screening     Status: None   Collection Time: 12/27/17  1:46 AM  Result Value Ref Range Status   MRSA by PCR NEGATIVE NEGATIVE Final    Comment:        The GeneXpert MRSA Assay (FDA approved for NASAL specimens only), is one component of a comprehensive MRSA colonization surveillance program. It is not intended to diagnose MRSA infection nor to guide or monitor treatment for MRSA infections. Performed at [facility], [address], [place], [place] [address]     Radiology Reports Dg Chest 1 View  Result Date: 12/27/2017 CLINICAL DATA:  PICC line placement.  Atrial fibrillation and fever. EXAM: CHEST  1 VIEW COMPARISON:  12/02/2017 chest radiograph  FINDINGS: Cardiomegaly and slightly increasing interstitial/possible airspace opacities noted, RIGHT greater than LEFT. A RIGHT PICC line is present with tip overlying the mid-LOWER SVC. No definite pleural effusion or pneumothorax. IMPRESSION: 1. RIGHT PICC line with tip overlying the mid-LOWER SVC. 2. Slightly increasing interstitial and possible airspace opacities bilaterally. Electronically Signed   By: Margarette Canada M.D.   On: 12/27/2017 11:42   Dg Chest 2 View  Result Date: 12/01/2017 CLINICAL DATA:  Dyspnea and hypoxia. No history of fever and no leukocytosis. EXAM: CHEST - 2 VIEW COMPARISON:  None. FINDINGS: Borderline cardiomegaly. Bilateral Kerley lines and fissure thickening. No air bronchogram or pleural effusion. Aortic tortuosity. IMPRESSION: Few Kerley lines and mild fissure thickening suggesting early CHF. Electronically Signed   By: Monte Fantasia M.D.   On: 12/22/2017 16:42    Time Spent in minutes  35   Tyquasia Pant M.D on 12/28/2017 at 2:45 PM  Between 7am to 7pm - Pager - [phone]  After 7pm go to [email or web address] - password [facility]  [facility] -  Office  [phone]

## 2017-12-28 NOTE — Progress Notes (Signed)
Pt transferred to [facility] via [place]. Report called to 4E RN Sharrie Rothman.

## 2017-12-28 NOTE — Progress Notes (Signed)
HR continues 130-150 a-fib while on Cardizem gtt @ 15 mg/hr. Floor coverage MD notified. Order for Lopressor 5 mg IV once given.

## 2017-12-28 NOTE — Progress Notes (Signed)
Patient ID: [name], male   DOB: Sep 12, 1946, 72 y.o.   MRN: [id number]   Assessment/Plan: ADMITTED WITH SOB/SEPSIS. MENTAL STATUS IMPROVED. HR NOT IDEALLY CONTROLLED. NO ADDITIONAL BLOOD IN OSTOMY BAG. INR IMPROVED.  PLAN: 1. NEEDS TCS AS OUTPT IN MAY 2019. 2. CONTINUE PROTONIX DAILY. 3. CONTINUE TO MONITOR SYMPTOMS: BRBPR OR MELENA   Subjective: Since I last evaluated the patient HE HAS TWO MR#S. HIS LAST TCS WAS MAY 2016 AND HE HAD FRIABLE TISSUE AT THE OSTOMY SITE. HE HAS NO BLOOD REPORTED BY NURSING WHEN HIS OSTOMY BAG HAS BEEN CHANGED. CONTINUE WITH HEMATURIA. BEING TREATED FOR UTI. DENIES NAUSEA/VOMTIING.    Objective: Vital signs in last 24 hours: Vitals:   12/28/17 0630 12/28/17 0645  BP: (!) 143/67 (!) 130/94  Pulse: 96 95  Resp: (!) 29 (!) 24  Temp:    SpO2: (!) 89% 95%   General appearance: alert, no distress and slowed mentation Resp: clear to auscultation bilaterally Cardio: irregularly irregular rhythm GI: soft, non-tender; bowel sounds normal;  Extremities: extremities normal, atraumatic, no edema NEURO: PT HAS BASELINE TREMOR   Lab Results:  K 3.6 Cr 1.24 WBC 10 Hb 7.5 MCV 84 PLT CT 221 INR 2.67   Studies/Results: Dg Chest 1 View  Result Date: 12/27/2017 CLINICAL DATA:  PICC line placement.  Atrial fibrillation and fever. EXAM: CHEST  1 VIEW COMPARISON:  12/04/2017 chest radiograph FINDINGS: Cardiomegaly and slightly increasing interstitial/possible airspace opacities noted, RIGHT greater than LEFT. A RIGHT PICC line is present with tip overlying the mid-LOWER SVC. No definite pleural effusion or pneumothorax. IMPRESSION: 1. RIGHT PICC line with tip overlying the mid-LOWER SVC. 2. Slightly increasing interstitial and possible airspace opacities bilaterally. Electronically Signed   By: Margarette Canada M.D.   On: 12/27/2017 11:42    Medications: I have reviewed the patient's current medications.

## 2017-12-28 NOTE — Progress Notes (Signed)
Heart rate increased to 150. Diltiazem increased back to 10 mg. Rate was at 5 mg.

## 2017-12-28 NOTE — Consult Note (Addendum)
   [facility] Tele[facility]urology Consult Services  Impression:  Bilateral subdural bleeds with sz like activity Recommendations:  #1. KCentra to reverse INR(dose with pharmacy) #2. Discuss with nsgy #3.[place] with transfer #4. Marland Kitchen Continue Keppra 500 mg IV BID. #5. BP cntrol to less than 180/80   ---------------------------------------------------------------------  CC:  History of Present Illness:    [name] is a 72 y.o. that comes in with a bilateral subdural hematoma, and was on supratherputc INR/Coumadin for afib.  He has a history of rapid afib. PT has had recent seizure, here.   Vitals:   12/28/17 1500 12/28/17 1625 12/28/17 1700 12/28/17 1800  BP: (!) 191/168  (!) 173/103 (!) 183/93  Pulse: (!) 144 (!) 165 (!) 137 (!) 138  Resp: (!) 22 18 (!) 21 (!) 30  Temp:  (!) 100.5 F (38.1 C)    TempSrc:  Axillary    SpO2: 95% 95% 95% 97%  Weight:      Height:        Diagnostic Testing:   FINDINGS: 1. Bilateral subdural hematomas, right larger than left. Focal collection of blood in the right frontoparietal region with hematocrit level, ongoing active bleeding not excluded. Short interval follow-up recommended. 2. Minimal mass effect without significant midline shift Vital Signs:    Exam:  Mental Status:  Awake, alert, oriented  Naming: Intact Repetition: Intact   Speech: fluent  Cranial Nerves:  Pupils: Equal round and reactive to light Extraocular movements: Intact in all cardinal gaze Ptosis: Absent Visual fields: Intact to finger counting Facial sensation: Intact to pin and light touch Facial movements: left facial droop  Motor Exam:  Left sdied weakness, and left facial droop.   Tremor/Abnormal Movements:  Resting tremor: Absent Intention tremor: Absent Postural tremor: Absent  Sensory Exam:   Light touch: Intact Pinprick: Intact    Coordination:   Finger to nose: Intact Heel to shin: Intact  Medical Decision Making:  - Extensive number  of diagnosis or management options are considered above.   - Extensive amount of complex data reviewed.   - High risk of complication and/or morbidity or mortality are associated with differential diagnostic considerations above.  - There may be uncertain outcome and increased probability of prolonged functional impairment or high probability of severe prolonged functional impairment associated with some of these differential diagnosis.   Medical Data Reviewed:   1.Data reviewed include clinical labs, radiology,  Medical Tests;   2.Tests r esults discussed w/performing or interpreting physician;   3.Obtaining/reviewing old medical records;  4.Obtaining case history from another source;  5.Independent review of image, tracing or specimen.     Patient was informed the Neurology Consult would happen via telehealth (remote video) and consented to receiving care in this manner.

## 2017-12-28 NOTE — Progress Notes (Addendum)
Pt became restless/agitated twice last night and PRN Haldol had to be given. Precedex gtt was also turned back on. Weaning Precedex gtt down this AM. Pt does wake up and answer questions appropriately. Multiple clots in the foley bag this AM. Bag changed Cardizem gtt titrated down. HR 80's-110's. HR increases when agitation increases.

## 2017-12-29 ENCOUNTER — Inpatient Hospital Stay (HOSPITAL_COMMUNITY): Payer: Medicare Other

## 2017-12-29 ENCOUNTER — Other Ambulatory Visit (HOSPITAL_COMMUNITY): Payer: PRIVATE HEALTH INSURANCE

## 2017-12-29 ENCOUNTER — Encounter (HOSPITAL_COMMUNITY): Payer: Self-pay | Admitting: Neurology

## 2017-12-29 DIAGNOSIS — R4182 Altered mental status, unspecified: Secondary | ICD-10-CM

## 2017-12-29 LAB — BLOOD GAS, ARTERIAL
Acid-Base Excess: 5.4 mmol/L — ABNORMAL HIGH (ref 0.0–2.0)
Bicarbonate: 28.8 mmol/L — ABNORMAL HIGH (ref 20.0–28.0)
Drawn by: 358491
FIO2: 28
O2 SAT: 98.3 %
PCO2 ART: 37.8 mmHg (ref 32.0–48.0)
PO2 ART: 107 mmHg (ref 83.0–108.0)
Patient temperature: 98.6
pH, Arterial: 7.493 — ABNORMAL HIGH (ref 7.350–7.450)

## 2017-12-29 LAB — CBC
HCT: 25.5 % — ABNORMAL LOW (ref 39.0–52.0)
Hemoglobin: 8.1 g/dL — ABNORMAL LOW (ref 13.0–17.0)
MCH: 26.7 pg (ref 26.0–34.0)
MCHC: 31.8 g/dL (ref 30.0–36.0)
MCV: 84.2 fL (ref 78.0–100.0)
Platelets: 310 10*3/uL (ref 150–400)
RBC: 3.03 MIL/uL — AB (ref 4.22–5.81)
RDW: 14.7 % (ref 11.5–15.5)
WBC: 12.7 10*3/uL — AB (ref 4.0–10.5)

## 2017-12-29 LAB — BPAM RBC
BLOOD PRODUCT EXPIRATION DATE: 201904282359
ISSUE DATE / TIME: 201903311057
UNIT TYPE AND RH: 6200

## 2017-12-29 LAB — TYPE AND SCREEN
ABO/RH(D): AB POS
ABO/RH(D): AB POS
ANTIBODY SCREEN: NEGATIVE
Antibody Screen: NEGATIVE
Unit division: 0

## 2017-12-29 LAB — PROTIME-INR
INR: 1.4
INR: 1.43
INR: 1.24
INR: 1.29
PROTHROMBIN TIME: 17 s — AB (ref 11.4–15.2)
Prothrombin Time: 17.4 seconds — ABNORMAL HIGH (ref 11.4–15.2)
Prothrombin Time: 15.5 seconds — ABNORMAL HIGH (ref 11.4–15.2)
Prothrombin Time: 16 seconds — ABNORMAL HIGH (ref 11.4–15.2)

## 2017-12-29 LAB — PREPARE FRESH FROZEN PLASMA
UNIT DIVISION: 0
Unit division: 0
Unit division: 0
Unit division: 0

## 2017-12-29 LAB — GLUCOSE, CAPILLARY
GLUCOSE-CAPILLARY: 191 mg/dL — AB (ref 65–99)
GLUCOSE-CAPILLARY: 161 mg/dL — AB (ref 65–99)
GLUCOSE-CAPILLARY: 186 mg/dL — AB (ref 65–99)

## 2017-12-29 LAB — BASIC METABOLIC PANEL
ANION GAP: 11 (ref 5–15)
BUN: 29 mg/dL — ABNORMAL HIGH (ref 6–20)
CALCIUM: 8.8 mg/dL — AB (ref 8.9–10.3)
CO2: 24 mmol/L (ref 22–32)
Chloride: 105 mmol/L (ref 101–111)
Creatinine, Ser: 1.44 mg/dL — ABNORMAL HIGH (ref 0.61–1.24)
GFR, EST AFRICAN AMERICAN: 56 mL/min — AB (ref >60)
GFR, EST NON AFRICAN AMERICAN: 48 mL/min — AB (ref >60)
GLUCOSE: 190 mg/dL — AB (ref 65–99)
POTASSIUM: 3.2 mmol/L — AB (ref 3.5–5.1)
SODIUM: 140 mmol/L (ref 135–145)

## 2017-12-29 LAB — BPAM FFP
BLOOD PRODUCT EXPIRATION DATE: 201904012359
BLOOD PRODUCT EXPIRATION DATE: 201904042359
BLOOD PRODUCT EXPIRATION DATE: 201904052359
Blood Product Expiration Date: 201904052359
ISSUE DATE / TIME: 201903301250
ISSUE DATE / TIME: 201903301458
ISSUE DATE / TIME: 201903311837
ISSUE DATE / TIME: 201903311837
UNIT TYPE AND RH: 8400
UNIT TYPE AND RH: 8400
Unit Type and Rh: 8400
Unit Type and Rh: 8400

## 2017-12-29 LAB — INFLUENZA PANEL BY PCR (TYPE A & B)
INFLBPCR: NEGATIVE
Influenza A By PCR: NEGATIVE

## 2017-12-29 LAB — ABO/RH: ABO/RH(D): AB POS

## 2017-12-29 LAB — HEMOGLOBIN A1C
Hgb A1c MFr Bld: 6.1 % — ABNORMAL HIGH (ref 4.8–5.6)
Mean Plasma Glucose: 128.37 mg/dL

## 2017-12-29 MED ORDER — METOPROLOL TARTRATE 5 MG/5ML IV SOLN
INTRAVENOUS | Status: AC
  Administered 2017-12-29: 5 mg via INTRAVENOUS
  Filled 2017-12-29: qty 5

## 2017-12-29 MED ORDER — METOPROLOL TARTRATE 5 MG/5ML IV SOLN
5.0000 mg | Freq: Four times a day (QID) | INTRAVENOUS | Status: DC
  Administered 2017-12-29 – 2017-12-30 (×4): 5 mg via INTRAVENOUS
  Filled 2017-12-29 (×4): qty 5

## 2017-12-29 MED ORDER — FUROSEMIDE 10 MG/ML IJ SOLN
40.0000 mg | Freq: Every day | INTRAMUSCULAR | Status: DC
  Administered 2017-12-30: 40 mg via INTRAVENOUS
  Filled 2017-12-29: qty 4

## 2017-12-29 MED ORDER — IPRATROPIUM BROMIDE 0.02 % IN SOLN
0.5000 mg | Freq: Four times a day (QID) | RESPIRATORY_TRACT | Status: DC
  Administered 2017-12-29 – 2017-12-30 (×5): 0.5 mg via RESPIRATORY_TRACT
  Filled 2017-12-29 (×5): qty 2.5

## 2017-12-29 MED ORDER — METOPROLOL TARTRATE 5 MG/5ML IV SOLN: 5.0000 mg | Freq: Once | INTRAVENOUS | Status: AC

## 2017-12-29 MED ORDER — LEVALBUTEROL HCL 0.63 MG/3ML IN NEBU
0.6300 mg | INHALATION_SOLUTION | Freq: Four times a day (QID) | RESPIRATORY_TRACT | Status: DC | PRN
Start: 2017-12-29 — End: 2017-12-29
  Administered 2017-12-29: 0.63 mg via RESPIRATORY_TRACT
  Filled 2017-12-29: qty 3

## 2017-12-29 MED ORDER — MAGNESIUM SULFATE 2 GM/50ML IV SOLN
2.0000 g | INTRAVENOUS | Status: AC
  Administered 2017-12-29 (×2): 2 g via INTRAVENOUS
  Filled 2017-12-29 (×2): qty 50

## 2017-12-29 MED ORDER — LEVALBUTEROL HCL 0.63 MG/3ML IN NEBU
0.6300 mg | INHALATION_SOLUTION | Freq: Four times a day (QID) | RESPIRATORY_TRACT | Status: DC
  Administered 2017-12-29 – 2017-12-30 (×5): 0.63 mg via RESPIRATORY_TRACT
  Filled 2017-12-29 (×5): qty 3

## 2017-12-29 MED ORDER — INSULIN ASPART 100 UNIT/ML ~~LOC~~ SOLN
0.0000 [IU] | SUBCUTANEOUS | Status: DC
  Administered 2017-12-29 – 2017-12-30 (×5): 2 [IU] via SUBCUTANEOUS
  Administered 2017-12-30 (×3): 3 [IU] via SUBCUTANEOUS
  Administered 2017-12-30 – 2017-12-31 (×2): 2 [IU] via SUBCUTANEOUS
  Administered 2017-12-31 (×2): 3 [IU] via SUBCUTANEOUS
  Administered 2017-12-31: 2 [IU] via SUBCUTANEOUS
  Administered 2017-12-31 – 2018-01-01 (×2): 3 [IU] via SUBCUTANEOUS
  Administered 2018-01-01: 5 [IU] via SUBCUTANEOUS
  Administered 2018-01-01: 3 [IU] via SUBCUTANEOUS
  Administered 2018-01-01: 5 [IU] via SUBCUTANEOUS
  Administered 2018-01-01 (×2): 3 [IU] via SUBCUTANEOUS
  Administered 2018-01-01: 2 [IU] via SUBCUTANEOUS
  Administered 2018-01-02: 5 [IU] via SUBCUTANEOUS
  Administered 2018-01-02: 3 [IU] via SUBCUTANEOUS
  Administered 2018-01-02 (×2): 2 [IU] via SUBCUTANEOUS
  Administered 2018-01-02: 3 [IU] via SUBCUTANEOUS
  Administered 2018-01-03: 5 [IU] via SUBCUTANEOUS
  Administered 2018-01-03: 7 [IU] via SUBCUTANEOUS
  Administered 2018-01-03 (×2): 5 [IU] via SUBCUTANEOUS

## 2017-12-29 MED ORDER — METHIMAZOLE 10 MG PO TABS
10.0000 mg | ORAL_TABLET | Freq: Two times a day (BID) | ORAL | Status: DC
  Administered 2017-12-30: 10 mg via ORAL
  Filled 2017-12-29 (×3): qty 1

## 2017-12-29 MED ORDER — METOPROLOL TARTRATE 5 MG/5ML IV SOLN
10.0000 mg | Freq: Once | INTRAVENOUS | Status: AC
  Administered 2017-12-29: 10 mg via INTRAVENOUS
  Filled 2017-12-29: qty 10

## 2017-12-29 MED ORDER — ORAL CARE MOUTH RINSE
15.0000 mL | Freq: Two times a day (BID) | OROMUCOSAL | Status: DC
  Administered 2017-12-30 (×2): 15 mL via OROMUCOSAL

## 2017-12-29 MED ORDER — LEVALBUTEROL HCL 0.63 MG/3ML IN NEBU
0.6300 mg | INHALATION_SOLUTION | RESPIRATORY_TRACT | Status: DC | PRN
  Administered 2018-01-08: 0.63 mg via RESPIRATORY_TRACT
  Filled 2017-12-29: qty 3

## 2017-12-29 MED ORDER — POTASSIUM CHLORIDE 10 MEQ/100ML IV SOLN
10.0000 meq | INTRAVENOUS | Status: AC
  Administered 2017-12-29 (×3): 10 meq via INTRAVENOUS
  Filled 2017-12-29 (×3): qty 100

## 2017-12-29 MED ORDER — METOPROLOL TARTRATE 5 MG/5ML IV SOLN: 5.0000 mg | Freq: Three times a day (TID) | INTRAVENOUS | Status: DC

## 2017-12-29 NOTE — Consult Note (Addendum)
Cardiology Consultation:   Patient ID: [name]; [id number]; [birth year]   Admit date: 11/30/2017 Date of Consult: 12/29/2017  Primary Care Provider: Lemmie Evens, MD Primary Cardiologist: Rozann Lesches, MD  Primary Electrophysiologist:     Patient Profile:   [name] is a 72 y.o. male with a hx of atrial fibrillation previously on diltiazem and coumadin, HTN, HLD, thoracic aortic aneurysm, DM, CKD stage III, colon cancer (2010) s/p resection and colostomy, and renal cancer (2010) who is being seen today for the evaluation of atrial fibrillation with rVR at the request of Dr. Tana Coast.  History of Present Illness:   [name] was last seen in our clinic by Dr. Domenic Polite in 2016. At that time, the patient reported having chronic Afib for 10 years. Coumadin was managed by his PCP. Strategy at that time was rate control. He was on diltiazem, which had been decreased for symptomatic bradycardia.   He presented to [facility] ER 12/22/2017 with worsening shortness of breath and exertional dyspnea over the past month.  He reported orthopnea, but denied chest pain.  On arrival in the ED, CXR with edema, elevated BNP of 476 but in setting of CKD stage III, troponin of 0.06, and INR greater than 10. EKG with Afib RVR in the 120s. Diltiazem drip was started and vitamin K given for reversal. He had heme positive stool and hematuria. He had acute toxic metabolic encephalopathy and bilateral SDH by CT head. He was transferred to [facility].  Echo with preserved LVEF of 60-65% with akinesis of the basal-inferior myocardium, left atrial dilation.   Wife it at bedside and assists in history. On my interview, pt has visible tremor. EEG ordered ASAP. Pt is oriented to person, place, and situation. He is slow to answer questions, but answers appropriately. He confirms that he has a long history of rate-controlled Afib on diltiazem and coumadin. He is asymptomatic with a heart rate in the 110-120s. He denies rapid  heart rate, palpitations, dizziness, chest pain, and feelings of syncope.   Wife reports a fall 2.5 weeks ago in which he hit the front of his head. It is unclear what precipitated his fall.   Past Medical History:  Diagnosis Date  . Atrial fibrillation ([place])   . Cancer ([place])    colon 2010  . Chronic anticoagulation   . Diabetes mellitus without complication ([place])   . Hypertension     Past Surgical History:  Procedure Laterality Date  . COLOSTOMY       Home Medications:  Prior to Admission medications   Medication Sig Start Date End Date Taking? Authorizing Provider  ANORO ELLIPTA 62.5-25 MCG/INH AEPB TAKE 1 PUFF BY MOUTH EVERY DAY 11/27/17  Yes [provider]  budesonide-formoterol (SYMBICORT) 160-4.5 MCG/ACT inhaler Inhale 2 puffs into the lungs 2 (two) times daily.   Yes [provider]  CALCIUM PO Take 200 mg by mouth daily.    Yes [provider]  Cholecalciferol 1000 units tablet Take 1,000 Units by mouth daily.   Yes [provider]  dicyclomine (BENTYL) 20 MG tablet  10/25/17  Yes [provider]  diltiazem (CARDIZEM) 60 MG tablet Take 1 tablet by mouth 4 (four) times daily.    Yes [provider]  DULoxetine (CYMBALTA) 20 MG capsule Take 20 mg by mouth daily.   Yes [provider]  gabapentin (NEURONTIN) 300 MG capsule 300 mg daily.  11/06/17  Yes [provider]  HYDROcodone-acetaminophen (NORCO) 10-325 MG tablet Take 1  tablet by mouth. 12/08/17  Yes [provider]  IRON PO Take by mouth.   Yes [provider]  LYRICA 100 MG capsule Take 1 capsule by mouth 3 (three) times daily. 11/08/17  Yes [provider]  metFORMIN (GLUCOPHAGE) 1000 MG tablet Take 1 tablet by mouth 2 (two) times daily.    Yes [provider]  Omega-3 Fatty Acids (FISH OIL PO) Take by mouth.   Yes [provider]  pioglitazone (ACTOS) 45 MG tablet Take by mouth daily.    Yes [provider]  Probiotic Product (PROBIOTIC PO) Take by mouth.   Yes [provider]  simvastatin (ZOCOR) 80 MG tablet Take 0.5 tablets by mouth daily.    Yes [provider]  warfarin (COUMADIN) 5 MG tablet Take 1 tablet by mouth as directed. Currently taking 10 mg on Monday, 5 mg Tuesday, 10mg  Wednesday, Thursday, and Friday. Saturday 5mg .   Yes [provider]    Inpatient Medications: Scheduled Meds: . Chlorhexidine Gluconate Cloth  6 each Topical Daily  . furosemide  40 mg Intravenous BID  . insulin aspart  0-9 Units Subcutaneous Q4H  . levalbuterol  0.63 mg Nebulization Q6H   And  . ipratropium  0.5 mg Nebulization Q6H  . metoprolol tartrate  5 mg Intravenous Q8H  . pantoprazole  40 mg Oral QAC breakfast  . potassium chloride  20 mEq Oral Daily  . sodium chloride flush  10-40 mL Intracatheter Q12H   Continuous Infusions: . diltiazem (CARDIZEM) infusion 15 mg/hr (12/29/17 0755)  . levETIRAcetam Stopped (12/29/17 0532)  . piperacillin-tazobactam (ZOSYN)  IV 3.375 g (12/29/17 0606)  . vancomycin Stopped (12/29/17 6967)   PRN Meds: acetaminophen **OR** acetaminophen, haloperidol lactate, hydrALAZINE, levalbuterol, LORazepam, ondansetron **OR** ondansetron (ZOFRAN) IV, sodium chloride flush  Allergies:   No Known Allergies  Social History:   Social History   Socioeconomic History  . Marital status: Married    Spouse name: Not on file  . Number of children: Not on file  . Years of education: Not on file  . Highest education level: Not on file  Occupational History  . Not on file  Social Needs  . Financial resource strain: Not on file  . Food insecurity:    Worry: Not on file    Inability: Not on file  . Transportation needs:    Medical: Not on file    Non-medical: Not on file  Tobacco Use  . Smoking status: Former Research scientist (life sciences)  . Smokeless tobacco: Never Used  Substance and Sexual Activity  . Alcohol use: Yes    Comment: occ  . Drug use:  Never  . Sexual activity: Not on file  Lifestyle  . Physical activity:    Days per week: Not on file    Minutes per session: Not on file  . Stress: Not on file  Relationships  . Social connections:    Talks on phone: Not on file    Gets together: Not on file    Attends religious service: Not on file    Active member of club or organization: Not on file    Attends meetings of clubs or organizations: Not on file    Relationship status: Not on file  . Intimate partner violence:    Fear of current or ex partner: Not on file    Emotionally abused: Not on file    Physically abused: Not on file    Forced sexual activity: Not on file  Other Topics  Concern  . Not on file  Social History Narrative  . Not on file    Family History:   No family history on file.   ROS:  Please see the history of present illness.   All other ROS reviewed and negative.     Physical Exam/Data:   Vitals:   12/29/17 0225 12/29/17 0417 12/29/17 0500 12/29/17 0631  BP: (!) 181/94 (!) 192/99    Pulse: (!) 115 (!) 115    Resp: (!) 37 (!) 37    Temp: (!) 100.7 F (38.2 C) 99.9 F (37.7 C) (!) 103.1 F (39.5 C) (!) 101.6 F (38.7 C)  TempSrc: Oral Oral Rectal Rectal  SpO2: 95% 96%    Weight:   257 lb 11.5 oz (116.9 kg)   Height:        Intake/Output Summary (Last 24 hours) at 12/29/2017 1005 Last data filed at 12/29/2017 0246 Gross per 24 hour  Intake 2396.64 ml  Output 3150 ml  Net -753.36 ml   Filed Weights   12/28/17 0445 12/28/17 2252 12/29/17 0500  Weight: 265 lb 14 oz (120.6 kg) 266 lb 12.1 oz (121 kg) 257 lb 11.5 oz (116.9 kg)   Body mass index is 33.09 kg/m.  General:  Obese male, appears to be in mild distress, slow to answer questions Neck: no JVD Vascular: No carotid bruits  Cardiac:  Irregular rhythm, tachycardic rate, heart sounds difficult over breath sounds Lungs:  Pt is tachypneic with increased work of breathing, wheezing  Abd: soft, nontender, no hepatomegaly  Skin: warm  and dry  Neuro:  Oriented to person, place, and situation Psych:  Flattened affect  EKG:  The EKG was personally reviewed and demonstrates:  Afib RVR Telemetry:  Telemetry was personally reviewed and demonstrates:  Afib RVR  Relevant CV Studies:  Echo 12/27/17: ------------------------------------------------------------------- Study Conclusions - Left ventricle: The cavity size was normal. There was moderate   concentric hypertrophy. Systolic function was normal. The   estimated ejection fraction was in the range of 60% to 65%.   Possible akinesis of the basalinferior myocardium. Doppler   parameters are consistent with high ventricular filling pressure. - Aortic valve: Trileaflet; mildly thickened, mildly calcified   leaflets. - Aorta: Aortic root dimension: 37 mm (ED). - Aortic root: The aortic root was mildly dilated. - Mitral valve: There was trivial regurgitation. - Left atrium: The atrium was severely dilated. - Right atrium: The atrium was severely dilated. - Tricuspid valve: There was trivial regurgitation. - Pulmonary arteries: Systolic pressure could not be accurately   estimated.  Laboratory Data:  Chemistry Recent Labs  Lab 12/27/17 0231 12/28/17 0410 12/29/17 0430  NA 148* 140 140  K 4.8 3.6 3.2*  CL 111 103 105  CO2 24 25 24   GLUCOSE 151* 167* 190*  BUN 31* 40* 29*  CREATININE 1.39* 1.24 1.44*  CALCIUM 10.2 9.4 8.8*  GFRNONAA 50* 58* 48*  GFRAA 58* >60 56*  ANIONGAP 13 12 11     Recent Labs  Lab 12/17/2017 1615  PROT 7.0  ALBUMIN 3.4*  AST 14*  ALT 19  ALKPHOS 64  BILITOT 1.9*   Hematology Recent Labs  Lab 12/27/17 0231  12/28/17 0410 12/28/17 1626 12/29/17 0430  WBC 11.6*  --  10.0  --  12.7*  RBC 3.14*  --  2.82*  --  3.03*  HGB 8.4*   < > 7.5* 8.7* 8.1*  HCT 27.0*   < > 23.7* 26.2* 25.5*  MCV 86.0  --  84.0  --  84.2  MCH 26.8  --  26.6  --  26.7  MCHC 31.1  --  31.6  --  31.8  RDW 14.8  --  14.3  --  14.7  PLT 269  --  221  --   310   < > = values in this interval not displayed.   Cardiac Enzymes Recent Labs  Lab 12/08/2017 1615 12/11/2017 2100 12/27/17 0231 12/27/17 0944  TROPONINI 0.06* 0.07* 0.17* 0.15*   No results for input(s): TROPIPOC in the last 168 hours.  BNP Recent Labs  Lab 12/15/2017 1616  BNP 476.0*    DDimer No results for input(s): DDIMER in the last 168 hours.  Radiology/Studies:  Ct Abdomen Pelvis W Wo Contrast  Result Date: 12/28/2017 CLINICAL DATA:  Inpatient. History of right nephrectomy for kidney cancer. History of colon cancer in 2010. Abdominal pain. Fever. Hematuria. EXAM: CT ABDOMEN AND PELVIS WITHOUT AND WITH CONTRAST TECHNIQUE: Multidetector CT imaging of the abdomen and pelvis was performed following the standard protocol before and following the bolus administration of intravenous contrast. CONTRAST:  4mL ISOVUE-300 IOPAMIDOL (ISOVUE-300) INJECTION 61% COMPARISON:  None. FINDINGS: Lower chest: No significant pulmonary nodules or acute consolidative airspace disease. Hepatobiliary: Normal liver size. No liver mass. Cholelithiasis. No gallbladder distention, gallbladder wall thickening or pericholecystic fluid. No biliary ductal dilatation. Pancreas: Normal, with no mass or duct dilation. Spleen: Normal size. No mass. Adrenals/Urinary Tract: Normal adrenals. Right nephrectomy. No mass or fluid collection in the right nephrectomy bed. No left hydronephrosis. No left renal stones. Exophytic simple 1.4 cm renal cyst in the medial lower left kidney. No additional left renal lesions. Bladder collapsed by indwelling Foley catheter and not well evaluated. No bladder stones. Stomach/Bowel: Normal non-distended stomach. Normal caliber small bowel with no small bowel wall thickening. Appendix not discretely visualized. No pericecal inflammatory changes. Status post left hemicolectomy with end colostomy in the ventral right abdominal wall with Hartmann's pouch. No wall thickening or pericolonic fat  stranding in the remnant right colon. Rectal catheter is in place within the Hartmann's pouch. There is nonspecific mild to moderate circumferential wall thickening throughout the rectum. Vascular/Lymphatic: Atherosclerotic nonaneurysmal abdominal aorta. Portal, splenic and left renal veins appear patent. No pathologically enlarged lymph nodes in the abdomen or pelvis. Reproductive: Moderately enlarged prostate. Other: No pneumoperitoneum, ascites or focal fluid collection. Musculoskeletal: No aggressive appearing focal osseous lesions. Marked lower lumbar spondylosis. IMPRESSION: 1. Left hemicolectomy with ventral right abdominal wall end colostomy. No evidence of bowel obstruction. 2. Mild-to-moderate circumferential wall thickening in the Hartmann's pouch, suggesting a nonspecific proctitis. 3. No free air.  No abscess. 4. Cholelithiasis.  No CT findings of acute cholecystitis. 5. No evidence of metastatic disease in the abdomen or pelvis. 6. No evidence of tumor recurrence in the right nephrectomy bed. 7.  Aortic Atherosclerosis (ICD10-I70.0). 8. Moderately enlarged prostate. Electronically Signed   By: Ilona Sorrel M.D.   On: 12/28/2017 18:10   Dg Chest 1 View  Result Date: 12/27/2017 CLINICAL DATA:  PICC line placement.  Atrial fibrillation and fever. EXAM: CHEST  1 VIEW COMPARISON:  12/07/2017 chest radiograph FINDINGS: Cardiomegaly and slightly increasing interstitial/possible airspace opacities noted, RIGHT greater than LEFT. A RIGHT PICC line is present with tip overlying the mid-LOWER SVC. No definite pleural effusion or pneumothorax. IMPRESSION: 1. RIGHT PICC line with tip overlying the mid-LOWER SVC. 2. Slightly increasing interstitial and possible airspace opacities bilaterally. Electronically Signed   By: Margarette Canada M.D.   On: 12/27/2017  11:42   Dg Chest 2 View  Result Date: 12/20/2017 CLINICAL DATA:  Dyspnea and hypoxia. No history of fever and no leukocytosis. EXAM: CHEST - 2 VIEW  COMPARISON:  None. FINDINGS: Borderline cardiomegaly. Bilateral Kerley lines and fissure thickening. No air bronchogram or pleural effusion. Aortic tortuosity. IMPRESSION: Few Kerley lines and mild fissure thickening suggesting early CHF. Electronically Signed   By: Monte Fantasia M.D.   On: 12/23/2017 16:42   Ct Head Wo Contrast  Result Date: 12/28/2017 CLINICAL DATA:  Altered mental status. History of colon cancer, renal cell carcinoma, and atrial fibrillation on anticoagulation with supratherapeutic INR. EXAM: CT HEAD WITHOUT CONTRAST TECHNIQUE: Contiguous axial images were obtained from the base of the skull through the vertex without intravenous contrast. COMPARISON:  None. FINDINGS: Brain: There is a mixed density subdural hematoma extending diffusely over the right cerebral convexity. A focally prominent collection of extra-axial blood in the right frontoparietal region measures 1.6 cm in thickness with a hematocrit level. The other portions of the subdural hematoma are smaller, with a predominantly low-density component over the right frontal convexity measuring 7 mm in thickness. There is very mild mass effect on the underlying right cerebral hemisphere with at most trace leftward midline shift. A small subdural collection over the left frontal convexity measures 7 mm in thickness and is hypoattenuating relative to cortex. There is a small amount of hyperattenuating blood along the falx and right tentorium. No acute large territory infarct is identified. The ventricles are normal in size. Periventricular white matter hypodensities are nonspecific but compatible with minimal chronic small vessel ischemic disease. Vascular: Calcified atherosclerosis at the skull base. No hyperdense vessel. Skull: No fracture or focal osseous lesion. Sinuses/Orbits: Paranasal sinuses and mastoid air cells are clear. Unremarkable orbits. Other: None. IMPRESSION: 1. Bilateral subdural hematomas, right larger than left.  Focal collection of blood in the right frontoparietal region with hematocrit level, ongoing active bleeding not excluded. Short interval follow-up recommended. 2. Minimal mass effect without significant midline shift. Critical Value/emergent results were called by telephone at the time of interpretation on 12/28/2017 at 4:59 pm to Dr. Louellen Molder , who verbally acknowledged these results. Electronically Signed   By: Logan Bores M.D.   On: 12/28/2017 17:05   Dg Chest Port 1 View  Result Date: 12/29/2017 CLINICAL DATA:  Shortness of breath. EXAM: PORTABLE CHEST 1 VIEW COMPARISON:  Chest radiograph December 27, 2017 FINDINGS: Stable cardiomegaly. Calcified aortic knob. Fullness of the hila most consistent with vascular shadows. Similar interstitial prominence with small pleural effusions and apical capping. No pneumothorax. RIGHT PICC distal tip projects in distal superior vena cava. Soft tissue planes and included osseous structures are unchanged. IMPRESSION: Stable cardiomegaly and interstitial prominence compatible with pulmonary edema. Small pleural effusion. Stable appearance of RIGHT PICC. Aortic Atherosclerosis (ICD10-I70.0). Electronically Signed   By: Elon Alas M.D.   On: 12/29/2017 06:01    Assessment and Plan:   1. Atrial fibrillation with RVR Pt has a long history of rate-controlled Afib managed with cardizem and anticoagulated with coumadin. RVR is likely related to his sepsis.  - diltiazem drip running at 15 mg/hr - pt has received 10 mg IV lopressor at 0200 and 5 mg IV lopressor at 0825 today  - per telemetry, it appears that he responded to the IV lopressor with a reduction in HR from 140s to 120s. Agree with IV diltiazem (echo with preserved EF). On my exam, HR in the 110s. Pt is asymptomatic. Would avoid amiodarone in the  setting of pulmonary edema and small pleural effusion. - recommend continuing diltiazem drip and adding scheduled lopressor 5 mg IV q 6 hrs   2.  Hypertensive urgency Pressures have been in the 299-242A systolic.  Pt received 10 mg IV hydralazine at 0436. Will defer to neurology for blood pressure parameters. He is NPO. May consider scheduled IV hydralazine for pressure control. Will discuss with MD.    3. Elevated INR Presented with INR of over 10, reversed with vitamin K. INR now 1.44. Agree with holding anticoagulation in the setting of hypertensive urgency.   4. Elevated troponin After review of both Epic charts, I do not see a previous ischemic evaluation. Pt denies chest pain and EKG without clear signs of ischemia, but difficult to interpret with RVR. Troponin trend  0.06 --> 0.07 --> 0.17 --> 0.15. This elevated troponin may be due to demand ischemia in the setting of sepsis, Afib RVR, and hypertensive urgency. Would not be a good candidate for ischemic evaluation in the acute setting. Not a candidate for IV heparin with bilateral SDH. Will continue to monitor and re-evaluate for ischemic evaluation in the outpatient setting.    5. Shortness of breath, increased work of breathing, pulmonary edema - agree with 40 mg IV lasix daily - he is diuresing and is overall net negative 2L with 2.6 L urine output yesterday - foley in place for hematuria and strict I&Os - sCr rising 1.44 (1.24), primary team has held lasix after this morning's dose - K is 3.2, replace per primary team - Mg 1.3, replace with IV Mg  Overall septic picture with Afib RVR, bilateral subdural hematomas in the setting of elevated INR, and suspected seizure activity vs tremor (neurology evaluating). Will continue attempts for rate control. Defer anticoagulation decisions and HTN parameters to neurology.    For questions or updates, please contact [place] Please consult [email or web address] for contact info under Cardiology/STEMI.   Signed, Tami Lin Duke, PA  12/29/2017 10:05 AM As above, patient seen and examined.  Briefly he is a 72 year old male with past  medical history of permanent atrial fibrillation, hypertension, hyperlipidemia, thoracic aortic aneurysm, diabetes mellitus, chronic stage III kidney disease, colon cancer and renal cell cancer for evaluation of atrial fibrillation with rapid ventricular response.  Patient apparently fell 2 weeks ago and hit his head while on Coumadin.  He presented to the [facility] emergency room on March 29 with complaints of increasing dyspnea.  Electrocardiogram showed atrial fibrillation with rapid ventricular response.  INR was greater than 10.  He was treated with vitamin K.  Head CT showed bilateral subdural hematomas.  Echocardiogram showed ejection fraction 60-65%.  At time of my evaluation the patient is mildly confused.  He thinks he is at [facility].  He does describe mild dyspnea.  He denies chest pain.  He is tremulous and diaphoretic.  He is also febrile with temperature of 102.2.  Blood pressure 189/89.  Chest x-ray shows pulmonary edema and pleural effusion.  Creatinine is 1.44.  Hemoglobin 8.1.   1 permanent atrial fibrillation with elevated heart rate-patient has previously been managed with Cardizem and anticoagulated with Coumadin.  Will continue Cardizem and add running IV Lopressor to help with rate control.  Some of elevated by sepsis rate is likely being driven by sepsis, possible withdrawal and fever.  Anticoagulation will be discontinued because of bilateral subdural hematoma. CHADSvasc 3.   2 hypertensive urgency-continue Cardizem and add metoprolol.  Continue hydralazine as needed.  3  mildly elevated troponin-patient denies chest pain.  No clear trend and not consistent with acute coronary syndrome.  Would not pursue further ischemia evaluation.  4 elevated INR-this was corrected with vitamin K.  Hold Coumadin given subdural hematoma.  5 acute diastolic congestive heart failure-some pulmonary edema on x-ray.  Continue Lasix at present dose and follow renal function.  6 Bilateral  subdural hematoma- neurosurgery following.  7 possible sepsis-continue antibiotics.  Managed by primary care.  Dwayne Ruths, MD

## 2017-12-29 NOTE — Consult Note (Addendum)
12/29/2017 4:06 PM   [name] 11-Feb-1946 [id number]  Referring provider: Dr. Garen Grams  CC: Gross hematuria  HPI: The patient is a 72 year old gentleman with a past medical history of renal cell carcinoma status post right nephrectomy who was admitted to the hospital with subdural hematoma and altered mental status as well as supratherapeutic INR.  Urology was consulted for gross hematuria.  At presentation the patient's INR was greater than 10.  He has since normalized to 1.2.  However, in the interim he developed significant gross hematuria.  He was apparently passing clots yesterday though this has since stopped in discussion with the patient's primary doctor and nursing staff.  Patient's hemoglobin currently is 8.1.  Further history is not able to be obtained as the patient is currently undergoing an MRI of his head.  He did have a CT of his abdomen pelvis with and without contrast which showed a unremarkable solitary left kidney.  No  reason for his gross hematuria was noted.   PMH: Past Medical History:  Diagnosis Date  . Atrial fibrillation ([place])   . Cancer ([place])    colon 2010  . Chronic anticoagulation   . Diabetes mellitus without complication ([place])   . Hypertension     Surgical History: Past Surgical History:  Procedure Laterality Date  . COLOSTOMY       Allergies: No Known Allergies  Family History: Family History  Problem Relation Age of Onset  . Hypertension Mother   . Hypertension Father     Social History:  reports that he has quit smoking. He has never used smokeless tobacco. He reports that he drinks alcohol. He reports that he does not use drugs.  ROS: Unable to obtain  Physical Exam: BP (!) 189/89 (BP Location: Left Arm)   Pulse (!) 129   Temp (!) 102.1 F (38.9 C) (Oral)   Resp (!) 45   Ht 6\' 2"  (1.88 m)   Wt 257 lb 11.5 oz (116.9 kg)   SpO2 96%   BMI 33.09 kg/m   GI: Abdomen is soft, nontender, nondistended, no abdominal masses GU:  Foley catheter examined while patient was undergoing MRI.  It is amber colored with no clinically significant clots.  There is no serious or significant gross hematuria at this time.  It appears to be draining well.  Further examination was limited due to the patient being within the MRI machine.   Laboratory Data: Lab Results  Component Value Date   WBC 12.7 (H) 12/29/2017   HGB 8.1 (L) 12/29/2017   HCT 25.5 (L) 12/29/2017   MCV 84.2 12/29/2017   PLT 310 12/29/2017    Lab Results  Component Value Date   CREATININE 1.44 (H) 12/29/2017    No results found for: PSA  No results found for: TESTOSTERONE  Lab Results  Component Value Date   HGBA1C 6.1 (H) 12/29/2017    Urinalysis    Component Value Date/Time   COLORURINE AMBER (A) 12/16/2017 2110   APPEARANCEUR CLEAR 12/05/2017 2110   LABSPEC 1.021 12/21/2017 2110   PHURINE 5.0 12/07/2017 2110   GLUCOSEU NEGATIVE 12/02/2017 2110   HGBUR LARGE (A) 12/17/2017 2110   BILIRUBINUR NEGATIVE 12/19/2017 2110   KETONESUR 20 (A) 12/13/2017 2110   PROTEINUR 30 (A) 12/01/2017 2110   NITRITE NEGATIVE 12/24/2017 2110   LEUKOCYTESUR NEGATIVE 11/30/2017 2110    Pertinent Imaging: CT reviewed as above  Assessment & Plan:    1.  Gross hematuria This is likely secondary to his  supratherapeutic INR however the patient still will need outpatient cystoscopy.  At this time, continuous bladder irrigation is not recommended as his urine is clearing and there is no active clot formation.  If the patient begins to form significant clots, we could consider CBI at that time.  Though, I do not think this will be necessary as his gross hematuria is improved with correction of his INR.  Okay to DC Foley when patient is up and ambulatory.  No further urological intervention during this admission.  We will put contact information in the patient's chart for outpatient cystoscopy.  2.  History of renal cell carcinoma No evidence of disease   Nickie Retort, MD

## 2017-12-29 NOTE — Progress Notes (Signed)
Notified by Vickii Penna of pts condition.  Pt is now febrile at 103 rectally, HR 140s afib, RR 30s and sats 94% on 4L [place] with prominent rhonchii. BP 192/99. Pt is alert, diaphoretic and now only consistently oriented to self.  He can answer simple questions but is more disoriented.  When asked if he was having any SOB he stated "a little". I suggested Sharrie Rothman notify primary svc of condition and give tylenol for the fever.  Fever could be primary reason for the significant change in clinical status.  I told her to see how the tylenol helps and see if he is improved after his fever is reduced.

## 2017-12-29 NOTE — Progress Notes (Addendum)
[facility]                                                                              Patient Demographics  [name], is a 72 y.o. male, DOB - 07/27/1946, BEE:[id number]  Admit date - 12/27/2017   Admitting Physician Truett Mainland, DO  Outpatient Primary MD for the patient is Lemmie Evens, MD  Outpatient specialists:   LOS - 3  days   Medical records reviewed and are as summarized below:    Chief Complaint  Patient presents with  . Shortness of Breath       Brief summary   Patient is a 72 year old male with A. 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  Reportedly patient was not getting his INR checked regularly and reported intermittent nosebleeds.  the ED he was found to be in A. fib with RVR . mild pulmonary edema on chest x-ray with elevated BNP of 476 and troponin of 0.06, supratherapeutic INR >10.  Patient was admitted to stepdown unit on Cardizem drip, INR reversed with vitamin K. He was found to have Hemoccult positive stool. Few hours after admission, patient was noticed to be agitated, febrile with temp of 102.3 F and was placed on broad-spectrum antibiotics for possible pneumonia.  UA showed hematuria and urine drug screen positive for opiates only. Patient again became agitated and tachycardic with fever, concerning for active DTs given history of alcohol use and placed on Precedex drip.  Patient received 2 units FFP.  On 3/31, patient had tonic-clonic seizures, CT head showed bilateral small SDH more on the right.  Neurosurgery was consulted who recommended conservative management and follow-up head CT in 24 hours.  SDH was small and possibly due to supratherapeutic INR.  Patient was noticed to be again in rapid atrial for ablation with RVR, telemetry neurology was  consulted and patient was placed on Keppra.  Transferred to [name] stepdown given multiple acute issues.    Assessment & Plan    Principal Problem: Seizures, left-sided weakness? -New since 3/31, patient was placed on Keppra after loading dose at [facility] teleneurology consult -On examination, patient appears to have slight left-sided weakness, patient's wife at the bedside denies any prior history of stroke -Follow MRI of the brain, EEG - Discussed with neurology, Dr. Leonel Ramsay, follow further recommendations -Seizure precautions, serial neuro checks  Active problem Bilateral subdural hematoma -Likely due to supratherapeutic INR, wife at the bedside also reports patient had hit his head on a side table 2 weeks ago.  She had noticed tremors after that. -INR> 10.0 at admission, reversed with vitamin K and FFP - Currently INR 1.2 -Neurosurgery was consulted, Dr Annette Stable who had recommended conservative management, repeat CT head (MRI pending today)    Atrial fibrillation with RVR ([place]) -Still in atrial fib with RVR, maxed on Cardizem drip, -Placed on Lopressor 5 mg every 8 hours  -No anticoagulation secondary to bilateral subdural hematomas -Cardiology is consulted -2D echo 3/30 showed EF of 60-65%,  possible akinesis of the basal inferior myocardium -TSH suppressed 0.01, free T4 elevated 4.39  Hyperthyroidism -Currently in rapid atrial fibrillation, tremors suppressed TSH 0.01, free T4 elevated, -Follow T3, placed on methimazole 10 mg twice a day, IV Lopressor -SLP evaluation, if fails, will need cortrack  -Will need endocrinology evaluation outpatient after discharge.  Sepsis ([place]) with pneumonia, concern for aspiration pneumonia -Patient met sepsis criteria due to fevers, tachycardia, lactic acidosis, leukocytosis source likely pneumonia -Self evaluation today, continue empiric vancomycin and Zosyn, aspiration precaution -CT abdomen pelvis showed no acute intra-abdominal  pathology -Blood culture 1/2 with coagulase-negative staph, likely contaminant -Still spiking fevers, repeat blood cultures today -Deep suctioning, Xopenex with Atrovent q6hrs, pulm hygiene  Acute metabolic encephalopathy -Patient required Precedex drip for acute agitation currently off.  Likely due to seizures, sepsis, pneumonia, SDH -Per wife at the bedside, at baseline, patient is alert and oriented x3.  Currently oriented to self. -CT head 3/31 showed bilateral subdural hematomas right larger than left, minimal mass-effect. -MRI of brain pending today, neurology consulted.  If unable to complete MRI of the brain, will obtain CT head for comparison.   Acute pulmonary edema (HCC) likely due to acute diastolic CHF -Patient was placed on IV Lasix for diuresis, per wife, patient has been having dyspnea on exertion for last 1-2 months.   -2D echo showed EF of 60-65% with possible akinesis of basal inferior myocardium. -Negative balance of 3.0 L, weight on admission 286, down to 257 lbs   Acute blood loss anemia with hematuria and guaiac positive stool. -Possibly due to supratherapeutic INR on admission > 10 -CT abdomen and pelvis negative for acute intra-abdominal pathology possibly contributed by supratherapeutic INR.  -Continue Foley, place CBI -Received 2 units of FFP, 1 unit packed RBC for hemoglobin of 7.5 on 3/31. -H&H stable.  GI was consulted at  [facility] for blood in ostomy, seen by Dr. Trinda Pascal recommended PPI, colonoscopy in 2 months. ADDENDUM - paged urology x2 for CBI, awaiting response   Supratherapeutic INR/iatrogenic coagulopathy -Coumadin discontinued, high fall risk, had hit his head with a side table 2 weeks ago per wife -INR is more than 10 at the time of admission, reversed with vitamin K, FFP -Currently 1.2   Elevated troponin - Peaked at 0.17 likely due to demand ischemia, acute blood loss anemia, sepsis, seizure -2D echo with preserved EF but possibly  akinesis of the basilar inferior myocardium.    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  Diabetes mellitus type 2 -Hold outpatient oral hypoglycemics -Currently  NPO, changed to sliding scale insulin, sensitive q4hrs    Stage 3 chronic kidney disease (HCC)  -Creatinine trending up 1.4, baseline creatinine 1.2-1.3 - Currently NPO, hold lasix 2nd dose    Code Status: full  DVT Prophylaxis:   SCD's Family Communication: Discussed in detail with the patient, all imaging results, lab results explained to the patient's wife in detail    Disposition Plan: When hemodynamically stable, TBD Time Spent in minutes 45 minutes  Procedures:  CT head  Consultants:   Neurology GI Cardiology  Antimicrobials:   IV vancomycin 3/29  IV Zosyn 3/29   Medications  Scheduled Meds: . Chlorhexidine Gluconate Cloth  6 each Topical Daily  . furosemide  40 mg Intravenous BID  . insulin aspart  0-9 Units Subcutaneous Q4H  . levalbuterol  0.63 mg Nebulization Q6H   And  . ipratropium  0.5 mg Nebulization Q6H  . metoprolol tartrate  5 mg Intravenous Q8H  . pantoprazole  40 mg Oral QAC breakfast  . potassium chloride  20 mEq Oral Daily  . sodium chloride flush  10-40 mL Intracatheter Q12H   Continuous Infusions: . diltiazem (CARDIZEM) infusion 15 mg/hr (12/29/17 0755)  . levETIRAcetam Stopped (12/29/17 0532)  . piperacillin-tazobactam (ZOSYN)  IV 3.375 g (12/29/17 0606)  . vancomycin Stopped (12/29/17 3244)   PRN Meds:.acetaminophen **OR** acetaminophen, haloperidol lactate, hydrALAZINE, levalbuterol, LORazepam, ondansetron **OR** ondansetron (ZOFRAN) IV, sodium chloride flush   Antibiotics   Anti-infectives  (From admission, onward)   Start     Dose/Rate Route Frequency Ordered Stop   12/28/17 0300  vancomycin (VANCOCIN) 1,500 mg in sodium chloride 0.9 % 500 mL IVPB     1,500 mg 250 mL/hr over 120 Minutes Intravenous Every 24 hours 12/27/17 0934     12/27/17 0600  piperacillin-tazobactam (ZOSYN) IVPB 3.375 g     3.375 g 12.5 mL/hr over 240 Minutes Intravenous Every 8 hours 11/28/2017 2236     12/04/2017 2300  piperacillin-tazobactam (ZOSYN) IVPB 3.375 g     3.375 g 100 mL/hr over 30 Minutes Intravenous  Once 12/16/2017 2236 12/27/17 0031   12/15/2017 2245  vancomycin (VANCOCIN) 2,000 mg in sodium chloride 0.9 % 500 mL IVPB     2,000 mg 250 mL/hr over 120 Minutes Intravenous  Once 12/08/2017 2236 12/27/17 0532        Subjective:   Alanzo Lamb was seen and examined today.  Still confused, tremulous.  Oriented to self.  Spiking fevers. Tmax 103.1 F.  Difficult to obtain review of system from the patient due to his mental status.  No repeat seizures overnight.   Objective:   Vitals:   12/29/17 0225 12/29/17 0417 12/29/17 0500 12/29/17 0631  BP: (!) 181/94 (!) 192/99    Pulse: (!) 115 (!) 115    Resp: (!) 37 (!) 37    Temp: (!) 100.7 F (38.2 C) 99.9 F (37.7 C) (!) 103.1 F (39.5 C) (!) 101.6 F (38.7 C)  TempSrc: Oral Oral Rectal Rectal  SpO2: 95% 96%    Weight:   116.9 kg (257 lb 11.5 oz)   Height:        Intake/Output Summary (Last 24 hours) at 12/29/2017 1056 Last data filed at 12/29/2017 1010 Gross per 24 hour  Intake 2396.64 ml  Output 2700 ml  Net -303.36 ml     Wt Readings from Last 3 Encounters:  12/29/17 116.9 kg (257 lb 11.5 oz)     Exam  General: Alert and oriented to self, tremulous  Eyes: PERRLA, EOMI, Anicteric Sclera,  HEENT:    Cardiovascular: S1 S2 auscultated, tachycardia, irregularly irregular  Respiratory: Coarse rhonchi bilaterally  Gastrointestinal: Soft, nontender, nondistended, + bowel sounds  Ext: 1+ pedal edema bilaterally  Neuro:  difficult to assess, global weakness however left side more pronounced than the right  Musculoskeletal: No digital cyanosis, clubbing  Skin: No rashes  Psych: confused, oriented to self   Data Reviewed:  I have personally reviewed following labs and imaging studies  Micro Results Recent Results (from the past 240 hour(s))  Culture, blood (routine x 2)     Status: Abnormal (Preliminary result)   Collection Time: 12/06/2017  9:00 PM  Result Value Ref Range Status   Specimen Description   Final    LEFT ANTECUBITAL Performed at [facility], [address] [place], [place] [address]    Special Requests   Final    BOTTLES DRAWN AEROBIC ONLY Blood Culture adequate volume Performed at [facility], [address] [place], [place] [address]    Culture  Setup Time   Final    GRAM POSITIVE COCCI Gram Stain Report Called to,Read Back By and Verified With: HYLTON @ 3888 ON [id number] BY HENDERSON L CRITICAL RESULT CALLED TO, READ BACK BY AND VERIFIED WITH: C HOWARD,RN AT 1931 12/27/17 BY L BENFIELD    Culture (A)  Final    STAPHYLOCOCCUS SPECIES (COAGULASE NEGATIVE) THE SIGNIFICANCE OF ISOLATING THIS ORGANISM FROM A SINGLE SET OF BLOOD CULTURES WHEN MULTIPLE SETS ARE DRAWN IS UNCERTAIN. PLEASE NOTIFY THE MICROBIOLOGY DEPARTMENT WITHIN ONE WEEK IF SPECIATION AND SENSITIVITIES ARE REQUIRED. Performed at [facility], [place] [address], [place], [place] [address]    Report Status PENDING  Incomplete  Blood Culture ID Panel (Reflexed)     Status: Abnormal   Collection Time: 12/09/2017  9:00 PM  Result Value Ref Range Status   Enterococcus species NOT DETECTED NOT DETECTED Final   Listeria monocytogenes NOT DETECTED NOT DETECTED Final   Staphylococcus species DETECTED (A) NOT DETECTED Final    Comment: Methicillin (oxacillin) susceptible coagulase negative staphylococcus. Possible blood culture contaminant (unless isolated from more than one blood culture draw or clinical case suggests  pathogenicity). No antibiotic treatment is indicated for blood  culture contaminants. CRITICAL RESULT CALLED TO, READ BACK BY AND VERIFIED WITH: C HOWARD,RN AT 1931 12/27/17 BY L BENFIELD    Staphylococcus aureus NOT DETECTED NOT DETECTED Final   Methicillin resistance NOT DETECTED NOT DETECTED Final   Streptococcus species NOT DETECTED NOT DETECTED Final   Streptococcus agalactiae NOT DETECTED NOT DETECTED Final   Streptococcus pneumoniae NOT DETECTED NOT DETECTED Final   Streptococcus pyogenes NOT DETECTED NOT DETECTED Final   Acinetobacter baumannii NOT DETECTED NOT DETECTED Final   Enterobacteriaceae species NOT DETECTED NOT DETECTED Final   Enterobacter cloacae complex NOT DETECTED NOT DETECTED Final   Escherichia coli NOT DETECTED NOT DETECTED Final   Klebsiella oxytoca NOT DETECTED NOT DETECTED Final   Klebsiella pneumoniae NOT DETECTED NOT DETECTED Final   Proteus species NOT DETECTED NOT DETECTED Final   Serratia marcescens NOT DETECTED NOT DETECTED Final   Haemophilus influenzae NOT DETECTED NOT DETECTED Final   Neisseria meningitidis NOT DETECTED NOT DETECTED Final   Pseudomonas aeruginosa NOT DETECTED NOT DETECTED Final   Candida albicans NOT DETECTED NOT DETECTED Final   Candida glabrata NOT DETECTED NOT DETECTED Final   Candida krusei NOT DETECTED NOT DETECTED Final   Candida parapsilosis NOT DETECTED NOT DETECTED Final   Candida tropicalis NOT DETECTED NOT DETECTED Final    Comment: Performed at [facility], [address] [place], [place] [address]  Culture, blood (routine x 2)     Status: None (Preliminary result)   Collection Time: 12/04/2017  9:10 PM  Result Value Ref Range Status   Specimen Description BLOOD LEFT HAND  Final   Special Requests   Final    BOTTLES DRAWN AEROBIC ONLY Blood Culture adequate volume   Culture   Final    NO GROWTH 3 DAYS Performed at [facility], [address], [place], [place] [address]    Report Status PENDING  Incomplete   MRSA PCR Screening  Status: None   Collection Time: 12/27/17  1:46 AM  Result Value Ref Range Status   MRSA by PCR NEGATIVE NEGATIVE Final    Comment:        The GeneXpert MRSA Assay (FDA approved for NASAL specimens only), is one component of a comprehensive MRSA colonization surveillance program. It is not intended to diagnose MRSA infection nor to guide or monitor treatment for MRSA infections. Performed at [facility], [address] [place], [place] [address]     Radiology Reports Ct Abdomen Pelvis W Wo Contrast  Result Date: 12/28/2017 CLINICAL DATA:  Inpatient. History of right nephrectomy for kidney cancer. History of colon cancer in 2010. Abdominal pain. Fever. Hematuria. EXAM: CT ABDOMEN AND PELVIS WITHOUT AND WITH CONTRAST TECHNIQUE: Multidetector CT imaging of the abdomen and pelvis was performed following the standard protocol before and following the bolus administration of intravenous contrast. CONTRAST:  76m ISOVUE-300 IOPAMIDOL (ISOVUE-300) INJECTION 61% COMPARISON:  None. FINDINGS: Lower chest: No significant pulmonary nodules or acute consolidative airspace disease. Hepatobiliary: Normal liver size. No liver mass. Cholelithiasis. No gallbladder distention, gallbladder wall thickening or pericholecystic fluid. No biliary ductal dilatation. Pancreas: Normal, with no mass or duct dilation. Spleen: Normal size. No mass. Adrenals/Urinary Tract: Normal adrenals. Right nephrectomy. No mass or fluid collection in the right nephrectomy bed. No left hydronephrosis. No left renal stones. Exophytic simple 1.4 cm renal cyst in the medial lower left kidney. No additional left renal lesions. Bladder collapsed by indwelling Foley catheter and not well evaluated. No bladder stones. Stomach/Bowel: Normal non-distended stomach. Normal caliber small bowel with no small bowel wall thickening. Appendix not discretely visualized. No pericecal inflammatory changes. Status post left  hemicolectomy with end colostomy in the ventral right abdominal wall with Hartmann's pouch. No wall thickening or pericolonic fat stranding in the remnant right colon. Rectal catheter is in place within the Hartmann's pouch. There is nonspecific mild to moderate circumferential wall thickening throughout the rectum. Vascular/Lymphatic: Atherosclerotic nonaneurysmal abdominal aorta. Portal, splenic and left renal veins appear patent. No pathologically enlarged lymph nodes in the abdomen or pelvis. Reproductive: Moderately enlarged prostate. Other: No pneumoperitoneum, ascites or focal fluid collection. Musculoskeletal: No aggressive appearing focal osseous lesions. Marked lower lumbar spondylosis. IMPRESSION: 1. Left hemicolectomy with ventral right abdominal wall end colostomy. No evidence of bowel obstruction. 2. Mild-to-moderate circumferential wall thickening in the Hartmann's pouch, suggesting a nonspecific proctitis. 3. No free air.  No abscess. 4. Cholelithiasis.  No CT findings of acute cholecystitis. 5. No evidence of metastatic disease in the abdomen or pelvis. 6. No evidence of tumor recurrence in the right nephrectomy bed. 7.  Aortic Atherosclerosis (ICD10-I70.0). 8. Moderately enlarged prostate. Electronically Signed   By: JIlona SorrelM.D.   On: 12/28/2017 18:10   Dg Chest 1 View  Result Date: 12/27/2017 CLINICAL DATA:  PICC line placement.  Atrial fibrillation and fever. EXAM: CHEST  1 VIEW COMPARISON:  12/03/2017 chest radiograph FINDINGS: Cardiomegaly and slightly increasing interstitial/possible airspace opacities noted, RIGHT greater than LEFT. A RIGHT PICC line is present with tip overlying the mid-LOWER SVC. No definite pleural effusion or pneumothorax. IMPRESSION: 1. RIGHT PICC line with tip overlying the mid-LOWER SVC. 2. Slightly increasing interstitial and possible airspace opacities bilaterally. Electronically Signed   By: JMargarette CanadaM.D.   On: 12/27/2017 11:42   Dg Chest 2  View  Result Date: 12/17/2017 CLINICAL DATA:  Dyspnea and hypoxia. No history of fever and no leukocytosis. EXAM: CHEST - 2 VIEW COMPARISON:  None. FINDINGS:  Borderline cardiomegaly. Bilateral Kerley lines and fissure thickening. No air bronchogram or pleural effusion. Aortic tortuosity. IMPRESSION: Few Kerley lines and mild fissure thickening suggesting early CHF. Electronically Signed   By: Monte Fantasia M.D.   On: 12/10/2017 16:42   Ct Head Wo Contrast  Result Date: 12/28/2017 CLINICAL DATA:  Altered mental status. History of colon cancer, renal cell carcinoma, and atrial fibrillation on anticoagulation with supratherapeutic INR. EXAM: CT HEAD WITHOUT CONTRAST TECHNIQUE: Contiguous axial images were obtained from the base of the skull through the vertex without intravenous contrast. COMPARISON:  None. FINDINGS: Brain: There is a mixed density subdural hematoma extending diffusely over the right cerebral convexity. A focally prominent collection of extra-axial blood in the right frontoparietal region measures 1.6 cm in thickness with a hematocrit level. The other portions of the subdural hematoma are smaller, with a predominantly low-density component over the right frontal convexity measuring 7 mm in thickness. There is very mild mass effect on the underlying right cerebral hemisphere with at most trace leftward midline shift. A small subdural collection over the left frontal convexity measures 7 mm in thickness and is hypoattenuating relative to cortex. There is a small amount of hyperattenuating blood along the falx and right tentorium. No acute large territory infarct is identified. The ventricles are normal in size. Periventricular white matter hypodensities are nonspecific but compatible with minimal chronic small vessel ischemic disease. Vascular: Calcified atherosclerosis at the skull base. No hyperdense vessel. Skull: No fracture or focal osseous lesion. Sinuses/Orbits: Paranasal sinuses and  mastoid air cells are clear. Unremarkable orbits. Other: None. IMPRESSION: 1. Bilateral subdural hematomas, right larger than left. Focal collection of blood in the right frontoparietal region with hematocrit level, ongoing active bleeding not excluded. Short interval follow-up recommended. 2. Minimal mass effect without significant midline shift. Critical Value/emergent results were called by telephone at the time of interpretation on 12/28/2017 at 4:59 pm to Dr. Louellen Molder , who verbally acknowledged these results. Electronically Signed   By: Logan Bores M.D.   On: 12/28/2017 17:05   Dg Chest Port 1 View  Result Date: 12/29/2017 CLINICAL DATA:  Shortness of breath. EXAM: PORTABLE CHEST 1 VIEW COMPARISON:  Chest radiograph December 27, 2017 FINDINGS: Stable cardiomegaly. Calcified aortic knob. Fullness of the hila most consistent with vascular shadows. Similar interstitial prominence with small pleural effusions and apical capping. No pneumothorax. RIGHT PICC distal tip projects in distal superior vena cava. Soft tissue planes and included osseous structures are unchanged. IMPRESSION: Stable cardiomegaly and interstitial prominence compatible with pulmonary edema. Small pleural effusion. Stable appearance of RIGHT PICC. Aortic Atherosclerosis (ICD10-I70.0). Electronically Signed   By: Elon Alas M.D.   On: 12/29/2017 06:01    Lab Data:  CBC: Recent Labs  Lab 12/25/2017 1615 12/27/17 0231 12/27/17 0944 12/28/17 0410 12/28/17 1626 12/29/17 0430  WBC 9.7 11.6*  --  10.0  --  12.7*  NEUTROABS 8.2*  --   --   --   --   --   HGB 9.1* 8.4* 8.4* 7.5* 8.7* 8.1*  HCT 29.1* 27.0* 26.6* 23.7* 26.2* 25.5*  MCV 86.1 86.0  --  84.0  --  84.2  PLT 279 269  --  221  --  161   Basic Metabolic Panel: Recent Labs  Lab 11/29/2017 1615 12/08/2017 2100 12/27/17 0231 12/27/17 0742 12/28/17 0410 12/28/17 1626 12/29/17 0430  NA 141 141 148*  --  140  --  140  K 4.9 4.7 4.8  --  3.6  --  3.2*  CL 108  107 111  --  103  --  105  CO2 18* 20* 24  --  25  --  24  GLUCOSE 164* 153* 151*  --  167*  --  190*  BUN 24* 25* 31*  --  40*  --  29*  CREATININE 1.26* 1.28* 1.39*  --  1.24  --  1.44*  CALCIUM 10.6* 10.0 10.2  --  9.4  --  8.8*  MG  --   --   --  1.6*  --  1.3*  --    GFR: Estimated Creatinine Clearance: 65.8 mL/min (A) (by C-G formula based on SCr of 1.44 mg/dL (H)). Liver Function Tests: Recent Labs  Lab 12/02/2017 1615  AST 14*  ALT 19  ALKPHOS 64  BILITOT 1.9*  PROT 7.0  ALBUMIN 3.4*   No results for input(s): LIPASE, AMYLASE in the last 168 hours. Recent Labs  Lab 12/01/2017 2100  AMMONIA 26   Coagulation Profile: Recent Labs  Lab 12/13/2017 1618 12/27/17 0231 12/28/17 0410 12/29/17 0240 12/29/17 0430  INR >10.00* 8.59* 2.67 1.40 1.43   Cardiac Enzymes: Recent Labs  Lab 12/17/2017 1615 12/02/2017 2100 12/27/17 0231 12/27/17 0944  TROPONINI 0.06* 0.07* 0.17* 0.15*   BNP (last 3 results) No results for input(s): PROBNP in the last 8760 hours. HbA1C: Recent Labs    12/29/17 0900  HGBA1C 6.1*   CBG: Recent Labs  Lab 12/28/17 1139 12/28/17 1625 12/28/17 2055 12/28/17 2253 12/29/17 0623  GLUCAP 143* 146* 147* 157* 191*   Lipid Profile: No results for input(s): CHOL, HDL, LDLCALC, TRIG, CHOLHDL, LDLDIRECT in the last 72 hours. Thyroid Function Tests: Recent Labs    12/27/17 0231 12/27/17 0742  TSH 0.001*  --   FREET4  --  4.39*   Anemia Panel: No results for input(s): VITAMINB12, FOLATE, FERRITIN, TIBC, IRON, RETICCTPCT in the last 72 hours. Urine analysis:    Component Value Date/Time   COLORURINE AMBER (A) 11/28/2017 2110   APPEARANCEUR CLEAR 11/28/2017 2110   LABSPEC 1.021 12/27/2017 2110   PHURINE 5.0 12/25/2017 2110   GLUCOSEU NEGATIVE 12/09/2017 2110   HGBUR LARGE (A) 12/27/2017 2110   BILIRUBINUR NEGATIVE 12/09/2017 2110   KETONESUR 20 (A) 12/09/2017 2110   PROTEINUR 30 (A) 12/10/2017 2110   NITRITE NEGATIVE 12/17/2017 2110    LEUKOCYTESUR NEGATIVE 12/18/2017 2110     Annalena Piatt M.D. [facility] 12/29/2017, 10:56 AM  Pager: [phone] Between 7am to 7pm - call Pager - [phone]  After 7pm go to [email or web address] - password [email or web address]  Call night coverage person covering after 7pm

## 2017-12-29 NOTE — Significant Event (Addendum)
Rapid Response Event Note  Overview: Follow Up   Initial Focused Assessment: I went to assess the patient and to establish a baseline assessment as well. Patient was awake, slow to respond, but was able to answer of all my questions (AxO=4). Patient was able to follow commands, moves all extremities, sensation is intact as well, tremors in upper extremities.  Speech seems slurred at times, but per RN, this has been fluctuating. Currently his temperature has improved, now afebrile, HR in the 100-120s (on Cardizem). Hypertensive and has been as well, has PRNs ordered if needed. Sats are 100% on 3L [place], RR still in the 25-35, mild use of accessory muscles, patient does not appear in respiratory distress, denies SOB and denies having trouble breathing.  Lung sounds rhonchi throughout (upper and lower). When I arrived, patient had oral secretions that he did not seem to be able to clear quite on his own, weak cough. + pulses, skin warm and dry, good capillary refill, UO- tea colored, + ostomy, + foley.  Interventions: -- Recommend: CXR for the AM -- Aspiration Precautions (suction, HOB > 30, routine oral care) -- NPO (patient will be seen by SLP.   Plan of Care (if not transferred): -- RN to follow up with primary service and  RRT if patient's respiratory or neuro status changes.  -- I updated the [facility] NP as well.    Event Summary:   at    End Time 2330  Anamari Galeas R

## 2017-12-29 NOTE — Progress Notes (Signed)
SLP Cancellation Note  Patient Details Name: [name] MRN: [id number] DOB: Nov 10, 1945   Cancelled treatment:       Reason Eval/Treat Not Completed: Patient at procedure or test/unavailable  Gabriel Rainwater [place], CCC-SLP [phone]  Gabriel Rainwater Meryl 12/29/2017, 3:30 PM

## 2017-12-29 NOTE — Progress Notes (Signed)
Rectal temp has gone from 103.1 F to 101.6 F after rectal Tylenol given.

## 2017-12-29 NOTE — Procedures (Signed)
ELECTROENCEPHALOGRAM REPORT  Date of Study: 12/29/2017  Patient's Name: [name] MRN: [id number] Date of Birth: Oct 13, 1945  Referring Provider: Kathrynn Speed, MD  Clinical History: 72 year old man with subdural hematoma presents with right arm, leg and face twitching with altered mental status.  Medications: Keppra cardizem Zosyn Vancomycin  Technical Summary: A multichannel digital EEG recording measured by the international 10-20 system with electrodes applied with paste and impedances below 5000 ohms performed as portable with EKG monitoring in an awake patient.  Hyperventilation and photic stimulation were not performed.  The digital EEG was referentially recorded, reformatted, and digitally filtered in a variety of bipolar and referential montages for optimal display.   Description: The patient is awake during the recording.  During maximal wakefulness, there is an asymmetric, medium voltage 6-7 Hz posterior dominant rhythm. This is admixed with diffuse 4-5 Hz theta slowing of the waking background.  There is also superimposed 2-3 Hz delta slowing in the right hemisphere.  Stage 2 sleep is not seen.  There were no epileptiform discharges or electrographic seizures seen.    EKG lead was unremarkable.  Impression: This awake EEG is abnormal due to:  1.  diffuse slowing of the waking background. 2.  Focal right hemispheric slowing  Clinical Correlation of the above findings indicates: 1. diffuse cerebral dysfunction that is non-specific in etiology and can be seen with hypoxic/ischemic injury, toxic/metabolic encephalopathies, neurodegenerative disorders, or medication effect.   2.  A structural or other physiologic abnormality in the right hemisphere.  The absence of epileptiform discharges does not rule out a clinical diagnosis of epilepsy.  Clinical correlation is advised.   Metta Clines, DO

## 2017-12-29 NOTE — Consult Note (Addendum)
NEURO HOSPITALIST CONSULT NOTE   Requestig physician: Dr. Tana Coast   Reason for Consult: possible seizure   History obtained from: Chart, patient, wife  HPI:                                                                                                                                          [name] is an 72 y.o. male Struve atrial fibrillation and colon cancer status post cholesterostomy present with shortness of breath at any pen in the ER.  Heart he had been feeling short of breath for approximately 1 month.  She was noted to also have agitation, tachycardia, fever and at that time was thought possibly to be going through DTs as he does have a history of alcohol abuse.  Thus he was started on a Precedex drip.  Her wife he had a fall approximately 2 weeks ago.  Per patient since 2 weeks ago he has been having on and off shaking of his right upper and lower extremity.  Was transferred to [facility] for further evaluation.  While in the ED was found to be in A. fib with RVR.  He was also found to have mild pulmonary edema on chest x-ray along with INR greater than 10.Marland Kitchen At that time his active problems were sepsis, acute toxic metabolic encephalopathy, acute pulmonary edema, he met dysuria and guaiac positive stools, supratherapeutic INR, stage III chronic kidney disease.  Patient received a head CT without contrast yesterday to evaluate for possible intracranial abnormalities.  CT revealed bilateral subdural hematomas, right larger than left.  Focal collection of blood in the right frontoparietal region with hematocrit level, ongoing active bleeding not excluded.  It also showed minimal mass-effect without significant midline shift.  Today patient was evaluated and right arm, leg, face twitching was noted along with his altered mental status.  For this reason there was concern for possible ongoing seizure.  EEG was ordered but has not been done at this point in time.  Currently  he is on Keppra 1000 mg twice daily.  Current vitals show temperature of 101.6, heart rate of 136, blood pressure of 192/99.  Max temperature was 103.1. Labs: Calcium 3.2 Glucose 190 BUN 29 And creatinine 1.44 Calcium 8.8 White blood cell count 12.7 PT 15.5 with INR of 1.24     Past Medical History:  Diagnosis Date  . Atrial fibrillation ([place])   . Cancer ([place])    colon 2010  . Chronic anticoagulation   . Diabetes mellitus without complication ([place])   . Hypertension     Past Surgical History:  Procedure Laterality Date  . COLOSTOMY      Family History  Problem Relation Age of Onset  . Hypertension Mother   . Hypertension Father  Social History:  reports that he has quit smoking. He has never used smokeless tobacco. He reports that he drinks alcohol. He reports that he does not use drugs.  No Known Allergies  MEDICATIONS:                                                                                                                     Prior to Admission:  Medications Prior to Admission  Medication Sig Dispense Refill Last Dose  . ANORO ELLIPTA 62.5-25 MCG/INH AEPB TAKE 1 PUFF BY MOUTH EVERY DAY  3 unknown  . budesonide-formoterol (SYMBICORT) 160-4.5 MCG/ACT inhaler Inhale 2 puffs into the lungs 2 (two) times daily.   unknown  . CALCIUM PO Take 200 mg by mouth daily.    Past Month at Unknown time  . Cholecalciferol 1000 units tablet Take 1,000 Units by mouth daily.   Past Week at Unknown time  . dicyclomine (BENTYL) 20 MG tablet Take 20 mg by mouth 3 (three) times daily as needed for spasms.    Past Week at Unknown time  . diltiazem (CARDIZEM) 60 MG tablet Take 60 mg by mouth 4 (four) times daily.    Past Week at Unknown time  . DULoxetine (CYMBALTA) 20 MG capsule Take 20 mg by mouth daily.     Marland Kitchen gabapentin (NEURONTIN) 300 MG capsule Take 300 mg by mouth daily.    Past Week at Unknown time  . HYDROcodone-acetaminophen (NORCO) 10-325 MG tablet Take 1  tablet by mouth daily as needed for moderate pain.    unknown  . IRON PO Take 325 mg by mouth daily.    unknown  . LYRICA 100 MG capsule Take 100 mg by mouth 3 (three) times daily.    Past Week at Unknown time  . metFORMIN (GLUCOPHAGE) 1000 MG tablet Take 1,000 mg by mouth 2 (two) times daily.    Past Week at Unknown time  . Omega-3 Fatty Acids (FISH OIL PO) Take 1,000 mg by mouth daily.    Past Week at Unknown time  . pioglitazone (ACTOS) 45 MG tablet Take 45 mg by mouth daily.    Past Week at Unknown time  . Probiotic Product (PROBIOTIC PO) Take 1 tablet by mouth daily.    unknown  . simvastatin (ZOCOR) 80 MG tablet Take 0.5 tablets by mouth daily.    Past Week at Unknown time  . warfarin (COUMADIN) 5 MG tablet Take 5-10 tablets by mouth as directed. 10 mg on [facility] FRI 5 mg TUES SAT   12/19/2017 at 0500   Scheduled: . Chlorhexidine Gluconate Cloth  6 each Topical Daily  . [START ON 12/30/2017] furosemide  40 mg Intravenous Daily  . insulin aspart  0-9 Units Subcutaneous Q4H  . levalbuterol  0.63 mg Nebulization Q6H   And  . ipratropium  0.5 mg Nebulization Q6H  . methimazole  10 mg Oral BID  . metoprolol tartrate  5 mg Intravenous Q8H  . pantoprazole  40 mg Oral QAC breakfast  .  sodium chloride flush  10-40 mL Intracatheter Q12H   Continuous: . diltiazem (CARDIZEM) infusion 15 mg/hr (12/29/17 0755)  . levETIRAcetam Stopped (12/29/17 0532)  . piperacillin-tazobactam (ZOSYN)  IV 3.375 g (12/29/17 0606)  . potassium chloride    . vancomycin Stopped (12/29/17 7035)     ROS:                                                                                                                                       History obtained from unobtainable from patient due to mental status   Blood pressure (!) 192/99, pulse (!) 115, temperature (!) 101.6 F (38.7 C), temperature source Rectal, resp. rate (!) 37, height _0  (1.88 m), weight 116.9 kg (257 lb 11.5 oz), SpO2 96 %.   General  Examination:                                                                                                       Physical Exam  HEENT-  Normocephalic, no lesions, without obvious abnormality.  Normal external eye and conjunctiva.   Cardiovascular- S1-S2 audible, pulses palpable throughout   Lungs-no rhonchi or wheezing noted, no excessive working breathing.  Saturations within normal limits Abdomen- All 4 quadrants palpated and nontender Extremities- Warm, dry and intact Musculoskeletal-no joint tenderness, deformity or swelling Skin-warm and dry, no hyperpigmentation, vitiligo, or suspicious lesions  Neurological Examination Mental Status: Alert, not oriented other than to [place].  Speech minimal but fluent without evidence of aphasia.  He is assistance to follow commands however he was able to follow my finger from left to right up to down.  He did smile to command however when asked to move his legs and lift his legs he needed assistance  cranial Nerves: II:  Visual fields grossly normal,  III,IV, VI: ptosis not present, extra-ocular motions intact bilaterally pupils equal, round, reactive to light and accommodation V,VII: smile left facial droop . facial light touch sensation normal bilaterally VIII: hearing normal bilaterally IX,X: uvula rises symmetrically XI: bilateral shoulder shrug XII: midline tongue extension Motor: Right : Upper extremity   5/5    Left:     Upper extremity   4+/5  Lower extremity   5/5     Lower extremity   4+/5 --Tremor of the right arm > leg. He is able to perform voluntray movements despite tremor.  Tone and bulk:normal tone throughout; no atrophy noted Sensory: Pinprick and light touch intact throughout, bilaterally Deep Tendon Reflexes: 3+ and  symmetric throughout lower extremities with bilateral clonus at ankles Plantars: Right: downgoing   Left: downgoing Cerebellar: Difficulty doing heel to shin and also finger-nose secondary to  confusion Gait: Not tested   Lab Results: Basic Metabolic Panel: Recent Labs  Lab 12/10/2017 1615 12/04/2017 2100 12/27/17 0231 12/27/17 0742 12/28/17 0410 12/28/17 1626 12/29/17 0430  NA 141 141 148*  --  140  --  140  K 4.9 4.7 4.8  --  3.6  --  3.2*  CL 108 107 111  --  103  --  105  CO2 18* 20* 24  --  25  --  24  GLUCOSE 164* 153* 151*  --  167*  --  190*  BUN 24* 25* 31*  --  40*  --  29*  CREATININE 1.26* 1.28* 1.39*  --  1.24  --  1.44*  CALCIUM 10.6* 10.0 10.2  --  9.4  --  8.8*  MG  --   --   --  1.6*  --  1.3*  --     CBC: Recent Labs  Lab 12/22/2017 1615 12/27/17 0231 12/27/17 0944 12/28/17 0410 12/28/17 1626 12/29/17 0430  WBC 9.7 11.6*  --  10.0  --  12.7*  NEUTROABS 8.2*  --   --   --   --   --   HGB 9.1* 8.4* 8.4* 7.5* 8.7* 8.1*  HCT 29.1* 27.0* 26.6* 23.7* 26.2* 25.5*  MCV 86.1 86.0  --  84.0  --  84.2  PLT 279 269  --  221  --  310    Cardiac Enzymes: Recent Labs  Lab 11/29/2017 1615 12/25/2017 2100 12/27/17 0231 12/27/17 0944  TROPONINI 0.06* 0.07* 0.17* 0.15*    Lipid Panel: No results for input(s): CHOL, TRIG, HDL, CHOLHDL, VLDL, LDLCALC in the last 168 hours.  Imaging: Ct Abdomen Pelvis W Wo Contrast  Result Date: 12/28/2017 CLINICAL DATA:  Inpatient. History of right nephrectomy for kidney cancer. History of colon cancer in 2010. Abdominal pain. Fever. Hematuria. EXAM: CT ABDOMEN AND PELVIS WITHOUT AND WITH CONTRAST TECHNIQUE: Multidetector CT imaging of the abdomen and pelvis was performed following the standard protocol before and following the bolus administration of intravenous contrast. CONTRAST:  88m ISOVUE-300 IOPAMIDOL (ISOVUE-300) INJECTION 61% COMPARISON:  None. FINDINGS: Lower chest: No significant pulmonary nodules or acute consolidative airspace disease. Hepatobiliary: Normal liver size. No liver mass. Cholelithiasis. No gallbladder distention, gallbladder wall thickening or pericholecystic fluid. No biliary ductal dilatation.  Pancreas: Normal, with no mass or duct dilation. Spleen: Normal size. No mass. Adrenals/Urinary Tract: Normal adrenals. Right nephrectomy. No mass or fluid collection in the right nephrectomy bed. No left hydronephrosis. No left renal stones. Exophytic simple 1.4 cm renal cyst in the medial lower left kidney. No additional left renal lesions. Bladder collapsed by indwelling Foley catheter and not well evaluated. No bladder stones. Stomach/Bowel: Normal non-distended stomach. Normal caliber small bowel with no small bowel wall thickening. Appendix not discretely visualized. No pericecal inflammatory changes. Status post left hemicolectomy with end colostomy in the ventral right abdominal wall with Hartmann's pouch. No wall thickening or pericolonic fat stranding in the remnant right colon. Rectal catheter is in place within the Hartmann's pouch. There is nonspecific mild to moderate circumferential wall thickening throughout the rectum. Vascular/Lymphatic: Atherosclerotic nonaneurysmal abdominal aorta. Portal, splenic and left renal veins appear patent. No pathologically enlarged lymph nodes in the abdomen or pelvis. Reproductive: Moderately enlarged prostate. Other: No pneumoperitoneum, ascites or focal fluid collection. Musculoskeletal: No aggressive appearing  focal osseous lesions. Marked lower lumbar spondylosis. IMPRESSION: 1. Left hemicolectomy with ventral right abdominal wall end colostomy. No evidence of bowel obstruction. 2. Mild-to-moderate circumferential wall thickening in the Hartmann's pouch, suggesting a nonspecific proctitis. 3. No free air.  No abscess. 4. Cholelithiasis.  No CT findings of acute cholecystitis. 5. No evidence of metastatic disease in the abdomen or pelvis. 6. No evidence of tumor recurrence in the right nephrectomy bed. 7.  Aortic Atherosclerosis (ICD10-I70.0). 8. Moderately enlarged prostate. Electronically Signed   By: Ilona Sorrel M.D.   On: 12/28/2017 18:10   Dg Chest 1  View  Result Date: 12/27/2017 CLINICAL DATA:  PICC line placement.  Atrial fibrillation and fever. EXAM: CHEST  1 VIEW COMPARISON:  12/14/2017 chest radiograph FINDINGS: Cardiomegaly and slightly increasing interstitial/possible airspace opacities noted, RIGHT greater than LEFT. A RIGHT PICC line is present with tip overlying the mid-LOWER SVC. No definite pleural effusion or pneumothorax. IMPRESSION: 1. RIGHT PICC line with tip overlying the mid-LOWER SVC. 2. Slightly increasing interstitial and possible airspace opacities bilaterally. Electronically Signed   By: Margarette Canada M.D.   On: 12/27/2017 11:42   Ct Head Wo Contrast  Result Date: 12/28/2017 CLINICAL DATA:  Altered mental status. History of colon cancer, renal cell carcinoma, and atrial fibrillation on anticoagulation with supratherapeutic INR. EXAM: CT HEAD WITHOUT CONTRAST TECHNIQUE: Contiguous axial images were obtained from the base of the skull through the vertex without intravenous contrast. COMPARISON:  None. FINDINGS: Brain: There is a mixed density subdural hematoma extending diffusely over the right cerebral convexity. A focally prominent collection of extra-axial blood in the right frontoparietal region measures 1.6 cm in thickness with a hematocrit level. The other portions of the subdural hematoma are smaller, with a predominantly low-density component over the right frontal convexity measuring 7 mm in thickness. There is very mild mass effect on the underlying right cerebral hemisphere with at most trace leftward midline shift. A small subdural collection over the left frontal convexity measures 7 mm in thickness and is hypoattenuating relative to cortex. There is a small amount of hyperattenuating blood along the falx and right tentorium. No acute large territory infarct is identified. The ventricles are normal in size. Periventricular white matter hypodensities are nonspecific but compatible with minimal chronic small vessel ischemic  disease. Vascular: Calcified atherosclerosis at the skull base. No hyperdense vessel. Skull: No fracture or focal osseous lesion. Sinuses/Orbits: Paranasal sinuses and mastoid air cells are clear. Unremarkable orbits. Other: None. IMPRESSION: 1. Bilateral subdural hematomas, right larger than left. Focal collection of blood in the right frontoparietal region with hematocrit level, ongoing active bleeding not excluded. Short interval follow-up recommended. 2. Minimal mass effect without significant midline shift. Critical Value/emergent results were called by telephone at the time of interpretation on 12/28/2017 at 4:59 pm to Dr. Louellen Molder , who verbally acknowledged these results. Electronically Signed   By: Logan Bores M.D.   On: 12/28/2017 17:05   Dg Chest Port 1 View  Result Date: 12/29/2017 CLINICAL DATA:  Shortness of breath. EXAM: PORTABLE CHEST 1 VIEW COMPARISON:  Chest radiograph December 27, 2017 FINDINGS: Stable cardiomegaly. Calcified aortic knob. Fullness of the hila most consistent with vascular shadows. Similar interstitial prominence with small pleural effusions and apical capping. No pneumothorax. RIGHT PICC distal tip projects in distal superior vena cava. Soft tissue planes and included osseous structures are unchanged. IMPRESSION: Stable cardiomegaly and interstitial prominence compatible with pulmonary edema. Small pleural effusion. Stable appearance of RIGHT PICC. Aortic Atherosclerosis (ICD10-I70.0). Electronically  Signed   By: Elon Alas M.D.   On: 12/29/2017 06:01    Assessment and plan per attending neurologist  Etta Quill PA-C [facility] [phone]  12/29/2017, 11:30 AM   Assessment/Plan: 72 yo M with possible seizures in the setting of EtOH withdrawal, sepsis, afib, bilateral SDH. He has been started on keppra. 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    1) continue keppra 1g BID.  2) will follow  Roland Rack,  MD [facility] [phone]  If 7pm- 7am, please page neurology on call as listed in [place].

## 2017-12-29 NOTE — Progress Notes (Signed)
During rounds I followed with pt from our radar list. No change in pt's status. Dr. Tana Coast had been to bedside for morning rounds. New orders placed and completed. Consult for cardiology and neurology also completed. ABG 7.49/37.8/107/28.8

## 2017-12-29 NOTE — Progress Notes (Signed)
EEG completed, results pending. 

## 2017-12-29 NOTE — Progress Notes (Signed)
Pharmacy Antibiotic Note  [name] is a 72 y.o. male admitted to [facility] on 12/25/2017 with sepsis, transferred to [facility] this morning d/t SDH in the setting of supratherapeutic INR Vancomycin and zosyn was started on 3/29 for sepsis. Tm 103.1, wbc 12.7K. Scr 1.44, not sure what his baseline renal function is, scr has been overall stable since 3/29  Also noted pt received KCentra for anticoagualtion reversal and started methimazole for new hyperthyroidism treatment.  Plan: Continue Vancomycin 1500mg  IV every 24 hours. Continue Zosyn 3.375g IV q8h (4 hour infusion). Monitor labs, micro and vitals.  Trough in 1-2 days if continues   Height: 6\' 2"  (188 cm) Weight: 257 lb 11.5 oz (116.9 kg) IBW/kg (Calculated) : 82.2  Temp (24hrs), Avg:101 F (38.3 C), Min:98.2 F (36.8 C), Max:103.1 F (39.5 C)  Recent Labs  Lab 12/15/2017 1615 12/15/2017 2100 12/18/2017 2310 12/27/17 0231 12/28/17 0410 12/29/17 0430  WBC 9.7  --   --  11.6* 10.0 12.7*  CREATININE 1.26* 1.28*  --  1.39* 1.24 1.44*  LATICACIDVEN  --  2.0* 2.3* 1.1  --   --     Estimated Creatinine Clearance: 65.8 mL/min (A) (by C-G formula based on SCr of 1.44 mg/dL (H)).    No Known Allergies  Antimicrobials this admission: Vanc 3/29 >>  Zosyn 3/29 >>   Dose adjustments this admission: Obesity/Normalized CrCl Vancomycin dosing protocol will be initiated with an estimated normalized CrCl = 50.7 ml/min.   Microbiology results: 3/29 BCx: 1of2 - Staph species (no resistance) 4/1 Blood:  3/29 MRSA PCR: (-)  Thank you for allowing pharmacy to be a part of this patient's care.  Maryanna Shape, PharmD, BCPS  Clinical Pharmacist  Pager: [phone]   12/29/2017 1:14 PM

## 2017-12-29 NOTE — Progress Notes (Signed)
Notified by Sharrie Rothman RN regarding pt and his situation in case his condition deteriorated.  He has multiple medical problems including sepsis, afib with RVR on cardizem, and bilateral SDH with elevated INR.He is admitted to [facility] level of care. Upon arrival, pt was alert lying in bed, oriented to person, time and situation with slightly delayed answers.  He has a known slight Left facial droop and Left sided weakness 4/5.  BBS rhonchi bilaterally NAD and denies SOB.  RR 20-30 with sats 98% on 2LNC.  BP 164/77.  Discussed patient with Vickii Penna and will continue to monitor.

## 2017-12-29 DEATH — deceased

## 2017-12-30 ENCOUNTER — Inpatient Hospital Stay (HOSPITAL_COMMUNITY): Payer: Medicare Other

## 2017-12-30 DIAGNOSIS — G9341 Metabolic encephalopathy: Secondary | ICD-10-CM

## 2017-12-30 DIAGNOSIS — I5031 Acute diastolic (congestive) heart failure: Secondary | ICD-10-CM

## 2017-12-30 DIAGNOSIS — R569 Unspecified convulsions: Secondary | ICD-10-CM

## 2017-12-30 DIAGNOSIS — I161 Hypertensive emergency: Secondary | ICD-10-CM

## 2017-12-30 DIAGNOSIS — E0591 Thyrotoxicosis, unspecified with thyrotoxic crisis or storm: Secondary | ICD-10-CM

## 2017-12-30 DIAGNOSIS — I6200 Nontraumatic subdural hemorrhage, unspecified: Secondary | ICD-10-CM

## 2017-12-30 DIAGNOSIS — I509 Heart failure, unspecified: Secondary | ICD-10-CM

## 2017-12-30 DIAGNOSIS — J9601 Acute respiratory failure with hypoxia: Secondary | ICD-10-CM

## 2017-12-30 LAB — BLOOD GAS, ARTERIAL
Acid-Base Excess: 7.8 mmol/L — ABNORMAL HIGH (ref 0.0–2.0)
BICARBONATE: 30.9 mmol/L — AB (ref 20.0–28.0)
DRAWN BY: 406621
FIO2: 100
O2 SAT: 99.6 %
PATIENT TEMPERATURE: 102.7
pCO2 arterial: 40 mmHg (ref 32.0–48.0)
pH, Arterial: 7.509 — ABNORMAL HIGH (ref 7.350–7.450)
pO2, Arterial: 176 mmHg — ABNORMAL HIGH (ref 83.0–108.0)

## 2017-12-30 LAB — CBC
HCT: 29 % — ABNORMAL LOW (ref 39.0–52.0)
Hemoglobin: 9 g/dL — ABNORMAL LOW (ref 13.0–17.0)
MCH: 26.5 pg (ref 26.0–34.0)
MCHC: 31 g/dL (ref 30.0–36.0)
MCV: 85.3 fL (ref 78.0–100.0)
PLATELETS: 322 10*3/uL (ref 150–400)
RBC: 3.4 MIL/uL — AB (ref 4.22–5.81)
RDW: 15.1 % (ref 11.5–15.5)
WBC: 12.5 10*3/uL — AB (ref 4.0–10.5)

## 2017-12-30 LAB — LACTIC ACID, PLASMA: LACTIC ACID, VENOUS: 1 mmol/L (ref 0.5–1.9)

## 2017-12-30 LAB — CULTURE, BLOOD (ROUTINE X 2): SPECIAL REQUESTS: ADEQUATE

## 2017-12-30 LAB — GLUCOSE, CAPILLARY
GLUCOSE-CAPILLARY: 183 mg/dL — AB (ref 65–99)
GLUCOSE-CAPILLARY: 183 mg/dL — AB (ref 65–99)
GLUCOSE-CAPILLARY: 201 mg/dL — AB (ref 65–99)
Glucose-Capillary: 191 mg/dL — ABNORMAL HIGH (ref 65–99)
Glucose-Capillary: 212 mg/dL — ABNORMAL HIGH (ref 65–99)
Glucose-Capillary: 192 mg/dL — ABNORMAL HIGH (ref 65–99)

## 2017-12-30 LAB — BASIC METABOLIC PANEL
Anion gap: 12 (ref 5–15)
BUN: 29 mg/dL — ABNORMAL HIGH (ref 6–20)
CHLORIDE: 104 mmol/L (ref 101–111)
CO2: 27 mmol/L (ref 22–32)
CREATININE: 1.47 mg/dL — AB (ref 0.61–1.24)
Calcium: 9.5 mg/dL (ref 8.9–10.3)
GFR calc non Af Amer: 47 mL/min — ABNORMAL LOW (ref >60)
GFR, EST AFRICAN AMERICAN: 54 mL/min — AB (ref >60)
Glucose, Bld: 246 mg/dL — ABNORMAL HIGH (ref 65–99)
Potassium: 3.1 mmol/L — ABNORMAL LOW (ref 3.5–5.1)
SODIUM: 143 mmol/L (ref 135–145)

## 2017-12-30 LAB — TRIGLYCERIDES: TRIGLYCERIDES: 750 mg/dL — AB (ref <150)

## 2017-12-30 LAB — T3: T3, Total: 162 ng/dL (ref 71–180)

## 2017-12-30 LAB — MAGNESIUM: MAGNESIUM: 2.4 mg/dL (ref 1.7–2.4)

## 2017-12-30 LAB — PROTIME-INR
INR: 1.34
Prothrombin Time: 16.4 seconds — ABNORMAL HIGH (ref 11.4–15.2)

## 2017-12-30 MED ORDER — PROPRANOLOL HCL 1 MG/ML IV SOLN
2.0000 mg | Freq: Once | INTRAVENOUS | Status: AC
  Administered 2017-12-30: 2 mg via INTRAVENOUS
  Filled 2017-12-30: qty 2

## 2017-12-30 MED ORDER — ORAL CARE MOUTH RINSE
15.0000 mL | OROMUCOSAL | Status: DC
  Administered 2017-12-30 – 2018-01-01 (×20): 15 mL via OROMUCOSAL

## 2017-12-30 MED ORDER — MORPHINE SULFATE (PF) 2 MG/ML IV SOLN
1.0000 mg | INTRAVENOUS | Status: AC | PRN
  Administered 2017-12-30 (×2): 1 mg via INTRAVENOUS
  Filled 2017-12-30 (×2): qty 1

## 2017-12-30 MED ORDER — PROPOFOL 1000 MG/100ML IV EMUL
INTRAVENOUS | Status: AC
  Administered 2017-12-30: 20 ug/kg/min via INTRAVENOUS
  Filled 2017-12-30: qty 100

## 2017-12-30 MED ORDER — FENTANYL 2500MCG IN NS 250ML (10MCG/ML) PREMIX INFUSION
0.0000 ug/h | INTRAVENOUS | Status: DC
  Administered 2017-12-30: 25 ug/h via INTRAVENOUS
  Administered 2017-12-31: 100 ug/h via INTRAVENOUS
  Administered 2018-01-01: 200 ug/h via INTRAVENOUS
  Filled 2017-12-30 (×3): qty 250

## 2017-12-30 MED ORDER — MIDAZOLAM HCL 2 MG/2ML IJ SOLN
INTRAMUSCULAR | Status: AC
  Filled 2017-12-30: qty 2

## 2017-12-30 MED ORDER — HYDROCORTISONE NA SUCCINATE PF 100 MG IJ SOLR
100.0000 mg | Freq: Three times a day (TID) | INTRAMUSCULAR | Status: DC
  Administered 2017-12-30 – 2018-01-01 (×6): 100 mg via INTRAVENOUS
  Filled 2017-12-30 (×6): qty 2

## 2017-12-30 MED ORDER — HYDRALAZINE HCL 20 MG/ML IJ SOLN
10.0000 mg | Freq: Once | INTRAMUSCULAR | Status: AC
  Administered 2017-12-30: 10 mg via INTRAVENOUS

## 2017-12-30 MED ORDER — METHIMAZOLE 10 MG PO TABS
10.0000 mg | ORAL_TABLET | Freq: Three times a day (TID) | ORAL | Status: DC
  Filled 2017-12-30: qty 1

## 2017-12-30 MED ORDER — PROPRANOLOL HCL 1 MG/ML IV SOLN
2.0000 mg | INTRAVENOUS | Status: AC
  Administered 2017-12-30 – 2017-12-31 (×5): 2 mg via INTRAVENOUS
  Filled 2017-12-30 (×6): qty 2

## 2017-12-30 MED ORDER — IPRATROPIUM BROMIDE 0.02 % IN SOLN
0.5000 mg | Freq: Three times a day (TID) | RESPIRATORY_TRACT | Status: DC
  Administered 2017-12-30 – 2018-01-02 (×9): 0.5 mg via RESPIRATORY_TRACT
  Filled 2017-12-30 (×9): qty 2.5

## 2017-12-30 MED ORDER — ACETAMINOPHEN 10 MG/ML IV SOLN
1000.0000 mg | Freq: Once | INTRAVENOUS | Status: AC
Start: 2017-12-30 — End: 2017-12-31
  Filled 2017-12-30 (×2): qty 100

## 2017-12-30 MED ORDER — LEVALBUTEROL HCL 0.63 MG/3ML IN NEBU
0.6300 mg | INHALATION_SOLUTION | Freq: Three times a day (TID) | RESPIRATORY_TRACT | Status: DC
  Administered 2017-12-30 – 2018-01-02 (×9): 0.63 mg via RESPIRATORY_TRACT
  Filled 2017-12-30 (×9): qty 3

## 2017-12-30 MED ORDER — METOPROLOL TARTRATE 5 MG/5ML IV SOLN
5.0000 mg | INTRAVENOUS | Status: DC
  Administered 2017-12-29: 5 mg via INTRAVENOUS
  Filled 2017-12-30: qty 5

## 2017-12-30 MED ORDER — METHIMAZOLE 10 MG PO TABS
20.0000 mg | ORAL_TABLET | ORAL | Status: DC
  Administered 2017-12-30 – 2017-12-31 (×4): 20 mg via ORAL
  Filled 2017-12-30 (×7): qty 2

## 2017-12-30 MED ORDER — MIDAZOLAM HCL 2 MG/2ML IJ SOLN
4.0000 mg | Freq: Once | INTRAMUSCULAR | Status: AC
  Administered 2017-12-30: 4 mg via INTRAVENOUS

## 2017-12-30 MED ORDER — PROPOFOL 1000 MG/100ML IV EMUL
5.0000 ug/kg/min | INTRAVENOUS | Status: DC
  Administered 2017-12-30: 40 ug/kg/min via INTRAVENOUS
  Administered 2017-12-30: 20 ug/kg/min via INTRAVENOUS
  Administered 2017-12-30 – 2017-12-31 (×2): 50 ug/kg/min via INTRAVENOUS
  Administered 2017-12-31: 40 ug/kg/min via INTRAVENOUS
  Administered 2017-12-31: 30 ug/kg/min via INTRAVENOUS
  Administered 2017-12-31: 20 ug/kg/min via INTRAVENOUS
  Administered 2018-01-01: 30 ug/kg/min via INTRAVENOUS
  Filled 2017-12-30 (×8): qty 100

## 2017-12-30 MED ORDER — ETOMIDATE 2 MG/ML IV SOLN
20.0000 mg | Freq: Once | INTRAVENOUS | Status: AC
  Administered 2017-12-30: 20 mg via INTRAVENOUS

## 2017-12-30 MED ORDER — METOPROLOL TARTRATE 5 MG/5ML IV SOLN
5.0000 mg | Freq: Four times a day (QID) | INTRAVENOUS | Status: DC
  Administered 2017-12-30: 5 mg via INTRAVENOUS
  Filled 2017-12-30: qty 5

## 2017-12-30 MED ORDER — SODIUM CHLORIDE 0.9 % IV SOLN
INTRAVENOUS | Status: DC
  Administered 2017-12-30 – 2018-01-06 (×2): via INTRAVENOUS

## 2017-12-30 MED ORDER — MIDAZOLAM HCL 2 MG/2ML IJ SOLN
INTRAMUSCULAR | Status: AC
  Administered 2017-12-30: 4 mg via INTRAVENOUS
  Filled 2017-12-30: qty 2

## 2017-12-30 MED ORDER — PROPRANOLOL HCL 1 MG/ML IV SOLN
1.0000 mg | INTRAVENOUS | Status: DC
  Filled 2017-12-30: qty 1

## 2017-12-30 MED ORDER — CHLORHEXIDINE GLUCONATE 0.12% ORAL RINSE (MEDLINE KIT)
15.0000 mL | Freq: Two times a day (BID) | OROMUCOSAL | Status: DC
  Administered 2017-12-30 – 2018-01-01 (×5): 15 mL via OROMUCOSAL

## 2017-12-30 MED ORDER — IODINE STRONG (LUGOLS) 5 % PO SOLN
0.3000 mL | Freq: Four times a day (QID) | ORAL | Status: DC
  Administered 2017-12-30 – 2018-01-01 (×6): 0.3 mL via ORAL
  Filled 2017-12-30 (×10): qty 0.3

## 2017-12-30 NOTE — Plan of Care (Signed)
  Problem: Clinical Measurements: Goal: Ability to maintain clinical measurements within normal limits will improve Outcome: Progressing Goal: Will remain free from infection Outcome: Progressing Goal: Diagnostic test results will improve Outcome: Progressing Goal: Respiratory complications will improve Outcome: Progressing Goal: Cardiovascular complication will be avoided Outcome: Progressing   Problem: Coping: Goal: Level of anxiety will decrease Outcome: Progressing Note:  Sedated    Problem: Elimination: Goal: Will not experience complications related to bowel motility Outcome: Progressing Note:  Colostomy   Problem: Pain Managment: Goal: General experience of comfort will improve Outcome: Progressing Note:  Intubated/sedated   Problem: Skin Integrity: Goal: Risk for impaired skin integrity will decrease Outcome: Progressing Note:  Q2 turns, sacral foam, heels floated   Problem: Activity: Goal: Ability to tolerate increased activity will improve Outcome: Progressing   Problem: Respiratory: Goal: Ability to maintain a clear airway and adequate ventilation will improve Outcome: Progressing   Problem: Education: Goal: Knowledge of General Education information will improve Outcome: Not Progressing Note:  Intubated/sedated   Problem: Health Behavior/Discharge Planning: Goal: Ability to manage health-related needs will improve Outcome: Not Progressing Note:  Intubated/sedated   Problem: Nutrition: Goal: Adequate nutrition will be maintained Outcome: Not Progressing Note:  OGT to low intermittent suction    Problem: Elimination: Goal: Will not experience complications related to urinary retention Outcome: Not Progressing Note:  Foley   Problem: Role Relationship: Goal: Method of communication will improve Outcome: Not Progressing Note:  Sedated, not interactive

## 2017-12-30 NOTE — Procedures (Signed)
Intubation Procedure Note [name] [id number] 05-21-1946  Procedure: Intubation Indications: Respiratory insufficiency  Procedure Details Consent: Unable to obtain consent because of emergent medical necessity. Time Out: Verified patient identification, verified procedure, site/side was marked, verified correct patient position, special equipment/implants available, medications/allergies/relevent history reviewed, required imaging and test results available.  Performed  Maximum sterile technique was used including gloves and mask.  MAC and 3 size 7.5 ETT from first attempt good color change and air sounds     Evaluation Hemodynamic Status: BP stable throughout; O2 sats: stable throughout Patient's Current Condition: stable Complications: No apparent complications Patient did tolerate procedure well. Chest X-ray ordered to verify placement.  CXR: pending.   [name] 12/30/2017

## 2017-12-30 NOTE — Progress Notes (Signed)
  Speech Language Pathology     Patient Details Name: [name] MRN: [id number] DOB: 1946-04-14 Today's Date: 12/30/2017 Time: 5300-5110 SLP Time Calculation (min) (ACUTE ONLY): 15 min      Full report to be documented. Pt awake, alert. He exhibits moderate-severe pharyngeal dysphagia with weakness given neurological changes on MRI. Silent aspiration with all trials except puree which left significant residue. Recommend continue NPO status with alternate means nutrition for now and ST will follow.    Orbie Pyo [place].Ed [employer] [phone]

## 2017-12-30 NOTE — Progress Notes (Signed)
[place] TEAM 1 - Stepdown/ICU TEAM  [name]  KYH:[id number] DOB: Jul 30, 1946 DOA: 12/08/2017 PCP: Lemmie Evens, MD    Brief Narrative:  72yo male with chronic A. fib on Coumadin, CKD stage III, DM 2, colon adenoCA s/p resection with colostomy, and renal cell carcinoma status post resection who presented to [facility] 3/29 with worsening shortness of breath over many weeks, acutely worsening the night of his presentation.  The pt had not been getting his INR checked regularly.  In the ED he was found to be in A. fib with RVR.  A CXR noted mild pulmonary edema.  Labs noted a supratherapeutic INR at >10.  Patient was admitted to [facility] stepdown unit on Cardizem drip, and his INR was reversed with vitamin K.  A few hours after admission he became agitated and febrile to 102.3 F.  He was placed on broad-spectrum antibiotics.  UDS was positive for opiates only. His agitation and tachycardic persisted, and he was placed on a Precedex drip.    On 3/31 he suffered a tonic-clonic seizure, and CT head showed bilateral small SDHs, > on the right.  Neurosurgery was consulted who recommended conservative management and follow-up head CT in 24 hours.  Neurology recommended Keppra.  Afib with RVR persisted.  The decision was made to transfer the pt to [facility] on the night of 3/31 > 4/1 given his multiple acute issues.  Significant Events: 3/29 admit to [facility] through ED 3/30 Precedex 3/30 TTE -  3/31 seizure - diagnosed w/ B SDH on CT head 3/31 > 4/1 night - transferred to [facility]   4/1 EEG - no epileptiform focus   Subjective: I was summoned to the unit by the patient's nurse due to refractory tachycardia and respiratory distress.  Upon visiting the patient finding breathing 40 times a minute on a nonrebreather mask with sats at 98% or better.  He is awake but confused.  He cannot provide a detailed history.  I spoke with his wife extensively at the bedside.  There is no evidence of chest pain nausea vomiting or  abdominal pain.  Review of history appears to reveal that he has experienced difficult to control tachycardia most of the morning with gradually progressive respiratory distress.  I asked his wife specifically about his alcohol intake.  She made it clear and was quite vehement that he does not drink more than 1-2 drinks in an entire week and certainly never drinks on a daily basis.  When he does drink she reports that he has one gin and tonic.  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 Pyrexia w/o clear evidence of infection - persisting tachycardia/afib RVR refractory to med tx - altered mental status - HTN urgency - maximally suppressed TSH - Free T4 over 4x the upper limit of normal;  change to IV propranolol - increase tapazole to TID - add high dose hydrocortisone - add Lugol's solution  Acute hypoxic resp distress I suspect thyroid storm > refractory tachycardia > pulmonary edema - given his tachypnea at 40bpm I worry he will soon tire out - I have asked PCCM to see him   Refractory Afib w/ RVR I believe this is being driven by thyroid storm - med choice per Cards - must avoid amiodarone in this setting   Seizures, left-sided weakness? New since 3/31 - on Keppra now - MRI of the brain w/o evidence of CVA - Neuro following   Bilateral subdural hematomas due to supratherapeutic INR / INR> 10.0 at  admission - reversed with vitamin K and FFP - Neurosurgery recommended conservative management - stable on f/u MRI head   SIRS v/s Sepsis - ?aspiration pneumonia Suspect his fever is due to thyroid storm and not infection - cont abx for now given severity of illness, but would have low threshold to stop after 5th day of tx (today)   Acute metabolic encephalopathy Again felt to be due to thyroid storm - I do not believe he drinks enough for this to be EtOH withdrawal - tx as above   Acute diastolic CHF - due to tachycardia  EF 60-65% - weight on admission 286, down to 261 lbs   Acute  blood loss anemia with hematuria and guaiac positive stool due to supratherapeutic INR on admission - CT abdomen and pelvis negative for acute intra-abdominal pathology - GI was consulted at [facility] for blood in ostomy, seen by Dr. 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  DM2 - controlled w/o complications   Stage 3 chronic kidney disease baseline creatinine 1.2-1.3 - follow for now - avoid nephrotoxins   Gross hematuria  likley simply due to supratherapeutic INR - Urology has seen - will need outpt cystocopy when stable    DVT prophylaxis: SCDs Code Status: FULL CODE Family Communication: spoke w/ wife at bedside  Disposition Plan: SDU - possible ICU transfer pending PCCM eval   Consultants:  Urology  Neurology Cardiolgoy  PCCM  Antimicrobials:  Zosyn 3/29 > Vanc 3/29 >  Objective: Blood pressure (!) 187/94, pulse (!) 119, temperature 98.3 F (36.8 C), temperature source Oral, resp. rate (!) 29, height 6\' 2"  (1.88 m), weight 118.5 kg (261 lb 3.9 oz), SpO2 100 %.  Intake/Output Summary (Last 24 hours) at 12/30/2017 1411 Last data filed at 12/30/2017 1401 Gross per 24 hour  Intake 1461.5 ml  Output 3780 ml  Net -2318.5 ml   Filed Weights   12/28/17 2252 12/29/17 0500 12/30/17 0400  Weight: 121 kg (266 lb 12.1 oz) 116.9 kg (257 lb 11.5 oz) 118.5 kg (261 lb 3.9 oz)    Examination: General: tachypneic - acute resp distress  Lungs: fine crackles th/o - no wheezing  Cardiovascular: irreg irreg - tachycardic - no M or rub    Abdomen: Nontender, nondistended, soft, bowel sounds positive, no rebound, no ascites, no appreciable mass Extremities: 1+ diffuse edema   CBC: Recent Labs  Lab 12/27/2017 1615  12/28/17 0410 12/28/17 1626 12/29/17 0430 12/30/17 0506  WBC 9.7   < > 10.0  --  12.7* 12.5*  NEUTROABS 8.2*  --   --   --   --   --   HGB 9.1*   < > 7.5* 8.7* 8.1* 9.0*  HCT 29.1*   < > 23.7* 26.2* 25.5* 29.0*  MCV 86.1   < > 84.0  --  84.2 85.3  PLT 279   < > 221  --  310 322   < > = values in this interval not displayed.   Basic Metabolic Panel: Recent Labs  Lab 12/27/17 0742 12/28/17 0410 12/28/17 1626 12/29/17  0430 12/30/17 0506  NA  --  140  --  140 143  K  --  3.6  --  3.2* 3.1*  CL  --  103  --  105 104  CO2  --  25  --  24 27  GLUCOSE  --  167*  --  190* 246*  BUN  --  40*  --  29* 29*  CREATININE  --  1.24  --  1.44* 1.47*  CALCIUM  --  9.4  --  8.8* 9.5  MG 1.6*  --  1.3*  --  2.4   GFR: Estimated Creatinine Clearance: 64.9 mL/min (A) (by C-G formula based on SCr of 1.47 mg/dL (H)).  Liver Function Tests: Recent Labs  Lab 12/21/2017 1615  AST 14*  ALT 19  ALKPHOS 64  BILITOT 1.9*  PROT 7.0  ALBUMIN 3.4*    Recent Labs  Lab 12/27/2017 2100  AMMONIA 26    Coagulation Profile: Recent Labs  Lab 12/29/17 0240 12/29/17 0430 12/29/17 1007 12/29/17 1838 12/30/17 0506  INR 1.40 1.43 1.24 1.29 1.34    Cardiac Enzymes: Recent Labs  Lab 12/17/2017 1615 12/06/2017 2100 12/27/17 0231 12/27/17 0944  TROPONINI 0.06* 0.07* 0.17* 0.15*    HbA1C: Hgb A1c MFr Bld  Date/Time Value Ref Range Status  12/29/2017 09:00 AM 6.1 (H) 4.8 - 5.6 % Final    Comment:    (NOTE) Pre diabetes:          5.7%-6.4% Diabetes:              >6.4% Glycemic control for   <7.0% adults with diabetes     CBG: Recent Labs  Lab 12/29/17 1201 12/29/17 2117 12/30/17 0021 12/30/17 0415 12/30/17 0814  GLUCAP 161* 186* 183* 191* 212*    Recent Results (from the past 240 hour(s))  Culture,  blood (routine x 2)     Status: Abnormal   Collection Time: 12/16/2017  9:00 PM  Result Value Ref Range Status   Specimen Description   Final    LEFT ANTECUBITAL Performed at [facility], [address] [place], [place] [address]    Special Requests   Final    BOTTLES DRAWN AEROBIC ONLY Blood Culture adequate volume Performed at [facility], [address] [place], [place] [address]    Culture  Setup Time   Final    GRAM POSITIVE COCCI Gram Stain Report Called to,Read Back By and Verified With: HYLTON @ 0160 ON [id number] BY HENDERSON L CRITICAL RESULT CALLED TO, READ BACK BY AND VERIFIED WITH: C HOWARD,RN AT 1931 12/27/17 BY L BENFIELD    Culture (A)  Final    STAPHYLOCOCCUS SPECIES (COAGULASE NEGATIVE) THE SIGNIFICANCE OF ISOLATING THIS ORGANISM FROM A SINGLE SET OF BLOOD CULTURES WHEN MULTIPLE SETS ARE DRAWN IS UNCERTAIN. PLEASE NOTIFY THE MICROBIOLOGY DEPARTMENT WITHIN ONE WEEK IF SPECIATION AND SENSITIVITIES ARE REQUIRED. Performed at [facility], [place] [address], [place],  [address]    Report Status 12/30/2017 FINAL  Final  Blood Culture ID Panel (Reflexed)     Status: Abnormal   Collection Time: 12/19/2017  9:00 PM  Result Value Ref Range Status   Enterococcus species NOT DETECTED NOT DETECTED Final   Listeria monocytogenes NOT DETECTED NOT DETECTED Final   Staphylococcus species DETECTED (A) NOT DETECTED Final    Comment: Methicillin (oxacillin) susceptible coagulase negative staphylococcus. Possible blood culture contaminant (unless isolated from more than one blood culture draw or clinical case suggests pathogenicity). No antibiotic  treatment is indicated for blood  culture contaminants. CRITICAL RESULT CALLED TO, READ BACK BY AND VERIFIED WITH: C HOWARD,RN AT 1931 12/27/17 BY L BENFIELD    Staphylococcus aureus NOT DETECTED NOT DETECTED Final   Methicillin resistance NOT DETECTED NOT DETECTED Final   Streptococcus species NOT DETECTED NOT DETECTED Final    Streptococcus agalactiae NOT DETECTED NOT DETECTED Final   Streptococcus pneumoniae NOT DETECTED NOT DETECTED Final   Streptococcus pyogenes NOT DETECTED NOT DETECTED Final   Acinetobacter baumannii NOT DETECTED NOT DETECTED Final   Enterobacteriaceae species NOT DETECTED NOT DETECTED Final   Enterobacter cloacae complex NOT DETECTED NOT DETECTED Final   Escherichia coli NOT DETECTED NOT DETECTED Final   Klebsiella oxytoca NOT DETECTED NOT DETECTED Final   Klebsiella pneumoniae NOT DETECTED NOT DETECTED Final   Proteus species NOT DETECTED NOT DETECTED Final   Serratia marcescens NOT DETECTED NOT DETECTED Final   Haemophilus influenzae NOT DETECTED NOT DETECTED Final   Neisseria meningitidis NOT DETECTED NOT DETECTED Final   Pseudomonas aeruginosa NOT DETECTED NOT DETECTED Final   Candida albicans NOT DETECTED NOT DETECTED Final   Candida glabrata NOT DETECTED NOT DETECTED Final   Candida krusei NOT DETECTED NOT DETECTED Final   Candida parapsilosis NOT DETECTED NOT DETECTED Final   Candida tropicalis NOT DETECTED NOT DETECTED Final    Comment: Performed at [facility], [address], [place], [place] [address]  Culture, blood (routine x 2)     Status: None (Preliminary result)   Collection Time: 11/28/2017  9:10 PM  Result Value Ref Range Status   Specimen Description BLOOD LEFT HAND  Final   Special Requests   Final    BOTTLES DRAWN AEROBIC ONLY Blood Culture adequate volume   Culture   Final    NO GROWTH 4 DAYS Performed at [facility], [address] [place], [place] [address]    Report Status PENDING  Incomplete  MRSA PCR Screening     Status: None   Collection Time: 12/27/17  1:46 AM  Result Value Ref Range Status   MRSA by PCR NEGATIVE NEGATIVE Final    Comment:        The GeneXpert MRSA Assay (FDA approved for NASAL specimens only), is one component of a comprehensive MRSA colonization surveillance program. It is not intended to diagnose MRSA infection nor to  guide or monitor treatment for MRSA infections. Performed at [facility], [address], [place], [place] [address]   Culture, blood (Routine X 2) w Reflex to ID Panel     Status: None (Preliminary result)   Collection Time: 12/29/17 11:54 AM  Result Value Ref Range Status   Specimen Description BLOOD BLOOD LEFT HAND  Final   Special Requests   Final    BOTTLES DRAWN AEROBIC ONLY Blood Culture adequate volume   Culture   Final    NO GROWTH < 24 HOURS Performed at [facility], [place] [address], [place],  [address]    Report Status PENDING  Incomplete  Culture, blood (Routine X 2) w Reflex to ID Panel     Status: None (Preliminary result)   Collection Time: 12/29/17 11:57 AM  Result Value Ref Range Status   Specimen Description BLOOD BLOOD LEFT HAND  Final   Special Requests   Final    BOTTLES DRAWN AEROBIC ONLY Blood Culture adequate volume   Culture   Final    NO GROWTH < 24 HOURS Performed at [facility], [address] [place], [place] [address]    Report Status PENDING  Incomplete     Scheduled Meds: . Chlorhexidine Gluconate Cloth  6 each Topical Daily  . furosemide  40 mg Intravenous Daily  . insulin aspart  0-9 Units Subcutaneous Q4H  . levalbuterol  0.63 mg Nebulization TID   And  . ipratropium  0.5 mg Nebulization TID  . mouth rinse  15 mL Mouth Rinse BID  . methimazole  10 mg Oral BID  . metoprolol tartrate  5 mg Intravenous Q4H  . pantoprazole  40 mg Oral QAC breakfast  . sodium chloride flush  10-40 mL Intracatheter Q12H   Continuous Infusions: . diltiazem (CARDIZEM) infusion 7.5 mg/hr (12/30/17 0834)  . levETIRAcetam Stopped (12/30/17 0525)  . piperacillin-tazobactam (ZOSYN)  IV Stopped (12/30/17 1048)  . vancomycin Stopped (12/30/17 0447)     LOS: 4 days   Cherene Altes, MD [facility] Office  [phone] Pager - Text Page per Amion as per below:  On-Call/Text Page:      [email or web address]      password [email or web address]  If  7PM-7AM, please contact night-coverage [email or web address] Password [facility] 12/30/2017, 2:11 PM

## 2017-12-30 NOTE — Progress Notes (Signed)
Modified Barium Swallow Progress Note  Patient Details  Name: [name] MRN: [id number] Date of Birth: 04-14-1946  Today's Date: 12/30/2017  Modified Barium Swallow completed.  Full report located under Chart Review in the Imaging Section.  Brief recommendations include the following:  Clinical Impression  Moderate-severe oropharyngeal dysphagia exhibited with silent aspiration of thin and nectar consistencies. Impairments are motor based with weakness resulting in decreased hyolaryngeal anterior excursion and incomplete defection of epiglottis and airway protection leading to vallecular residue (max with puree) and mild-moderate pyriform sinus with aspiration occuring during initial swallow and subsequent swallows in attempts to clear residual. Unable to adequately clear residue with cues. Volitional throat clear and cough are weak and ineffective all increasing aspiration risk. Recommend continue NPO with frequent oral care, pharyngeal strengthening exercises and RMT. MD consider PT/OT consult.    Swallow Evaluation Recommendations       SLP Diet Recommendations: NPO       Medication Administration: Via alternative means               Oral Care Recommendations: Oral care QID        [name] 12/30/2017,2:22 PM  Orbie Pyo [place] [phone]

## 2017-12-30 NOTE — Progress Notes (Signed)
Subjective: Wife is at bedside.  Patient has had no further seizures per wife.  Currently patient is in tachycardia with heart rate of 147 (cardiology following ) and appears short of breath however his oxygen saturations are 97 percent.  Exam: Vitals:   12/30/17 0859 12/30/17 1137  BP:  (!) 187/94  Pulse:    Resp:  (!) 29  Temp:  98.3 F (36.8 C)  SpO2: 96% 100%    Physical Exam   HEENT-  Normocephalic, no lesions, without obvious abnormality.  Normal external eye and conjunctiva.   Cardiovascular- S1-S2 audible, pulses palpable throughout   Lungs-no rhonchi or wheezing noted, no excessive working breathing.  Saturations within normal limits Abdomen- All 4 quadrants palpated and nontender Extremities- Warm, dry and intact Musculoskeletal-no joint tenderness, deformity or swelling Skin-warm and dry, no hyperpigmentation, vitiligo, or suspicious lesions    Neuro:  Mental Status: Alert, short of breath and breathing hard.  Only speaks minimally.  I could not get information out of him about person place or time however he does follow commands when asked.  The commands he did follow her only simple such as give me a thumb, raising his arms, smiling. Cranial Nerves: II: Discs flat bilaterally; Visual fields grossly normal,  III,IV, VI: ptosis not present, extra-ocular motions intact bilaterally pupils equal, round, reactive to light and accommodation V,VII: Facial droop, facial light touch sensation normal bilaterally VIII: hearing normal bilaterally IX,X: uvula rises symmetrically XI: bilateral shoulder shrug XII: midline tongue extension Motor: Right : Upper extremity   5/5    Left:     Upper extremity   5/5  Lower extremity   5/5     Lower extremity   5/5 --Continues to have postural tremor with arms greater than his legs.  Will follow commands and voluntarily move despite his tremor. Tone and bulk:normal tone throughout; no atrophy noted Sensory: Pinprick and light touch  intact throughout, bilaterally Deep Tendon Reflexes: 2+ and symmetric throughout Plantars: Mute toes bilaterally Cerebellar: normal finger-to-nose,  Gait: Not tested    Medications:  Scheduled: . Chlorhexidine Gluconate Cloth  6 each Topical Daily  . furosemide  40 mg Intravenous Daily  . insulin aspart  0-9 Units Subcutaneous Q4H  . levalbuterol  0.63 mg Nebulization TID   And  . ipratropium  0.5 mg Nebulization TID  . mouth rinse  15 mL Mouth Rinse BID  . methimazole  10 mg Oral BID  . metoprolol tartrate  5 mg Intravenous Q4H  . pantoprazole  40 mg Oral QAC breakfast  . sodium chloride flush  10-40 mL Intracatheter Q12H   Continuous: . diltiazem (CARDIZEM) infusion 7.5 mg/hr (12/30/17 0834)  . levETIRAcetam Stopped (12/30/17 0525)  . piperacillin-tazobactam (ZOSYN)  IV Stopped (12/30/17 1048)  . vancomycin Stopped (12/30/17 0447)   UEA:[id number] **OR** acetaminophen, haloperidol lactate, hydrALAZINE, levalbuterol, LORazepam, ondansetron **OR** ondansetron (ZOFRAN) IV, sodium chloride flush  Pertinent Labs/Diagnostics: EEG Impression: This awake EEG is abnormal due to:  1.  diffuse slowing of the waking background. 2.  Focal right hemispheric slowing  Clinical Correlation of the above findings indicates: 1. diffuse cerebral dysfunction that is non-specific in etiology and can be seen with hypoxic/ischemic injury, toxic/metabolic encephalopathies, neurodegenerative disorders, or medication effect.   2.  A structural or other physiologic abnormality in the right hemisphere.  No epileptiform activity.        Mr Brain Wo Contrast  Result Date: 12/29/2017  IMPRESSION: 1. Stable right greater than left subdural hematomas since the head  CT yesterday. The right side subdural demonstrates greater lobulation and signal heterogeneity measuring between 8 and 17 mm in thickness. The smaller more homogeneous left side subdural measures 6-7 mm in thickness. 2. Stable mild  intracranial mass effect with trace leftward midline shift. Normal basilar cisterns. Trace intraventricular hemorrhage with no ventriculomegaly. 3. No superimposed acute infarct. Electronically Signed   By: Genevie Ann M.D.   On: 12/29/2017 16:27   Dg Chest Port 1 View  Result Date: 12/29/2017 . IMPRESSION: Stable cardiomegaly and interstitial prominence compatible with pulmonary edema. Small pleural effusion. Stable appearance of RIGHT PICC. Aortic Atherosclerosis (ICD10-I70.0). Electronically Signed   By: Elon Alas M.D.   On: 12/29/2017 06:01     Etta Quill PA-C [facility] [phone]  Impression: 72 yo M with possible seizures in the setting of EtOH withdrawal, sepsis, afib, bilateral SDH. He has been started on keppra 1 g twice daily and tolerating well.  No further possible seizure activity..  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   --continue keppra 1g BID.  --At this point would recommend continuing patient on Keppra 1 g twice daily. Given concern for seizure recommend: Per [facility] statutes, patients with seizures are not allowed to drive until  they have been seizure-free for six months. Use caution when using heavy equipment or power tools. Avoid working on ladders or at heights. Take showers instead of baths. Ensure the water temperature is not too high on the home water heater. Do not go swimming alone. When caring for infants or small children, sit down when holding, feeding, or changing them to minimize risk of injury to the child in the event you have a seizure.      12/30/2017, 1:52 PM

## 2017-12-30 NOTE — Evaluation (Signed)
Clinical/Bedside Swallow Evaluation Patient Details  Name: [name] MRN: [id number] Date of Birth: 1946/01/01  Today's Date: 12/30/2017 Time: SLP Start Time (ACUTE ONLY): 0843 SLP Stop Time (ACUTE ONLY): 0858 SLP Time Calculation (min) (ACUTE ONLY): 15 min  Past Medical History:  Past Medical History:  Diagnosis Date  . Atrial fibrillation ([place])   . Cancer ([place])    colon 2010  . Chronic anticoagulation   . Diabetes mellitus without complication ([place])   . Hypertension    Past Surgical History:  Past Surgical History:  Procedure Laterality Date  . COLOSTOMY     HPI:  [name] is a 72 y.o. male with a history of a fib, stage 3 chron c kidney disease, DM2, adenocarcinoma of colon s/p resection and current colostomy, history of renal cell carcinoma s/o resection.  Patient presents with worsening shortness of breath over the past month with dyspnea that worsens with minimal exertion. MRI stable right greater than left subdural hematomas since the head CT The right side subdural demonstrates greater lobulation and signal heterogeneity measuring between 8 and 17 mm in thickness. The Stable mild intracranial mass effect with trace leftward midline shift. Normal basilar cisterns. Trace intraventricular hemorrhage with no ventriculomegaly.   Assessment / Plan / Recommendation Clinical Impression  Pt exhibits a suspected neurological dysphagia marked by s/s aspiration including immediate cough, delayed throat clearing. Varying respiratory rate from 27 to low 40's. Left sided labial weakness resulting in labial leakage. Given clinical observations and dx stroke, recommend an objective swallow assessment. MBS scheduled for 10:00 today.  SLP Visit Diagnosis: Dysphagia, oropharyngeal phase (R13.12)    Aspiration Risk  Moderate aspiration risk    Diet Recommendation NPO   Medication Administration: Via alternative means    Other  Recommendations     Follow up Recommendations (TBD)       Frequency and Duration            Prognosis        Swallow Study   General HPI: [name] is a 72 y.o. male with a history of a fib, stage 3 chron c kidney disease, DM2, adenocarcinoma of colon s/p resection and current colostomy, history of renal cell carcinoma s/o resection.  Patient presents with worsening shortness of breath over the past month with dyspnea that worsens with minimal exertion. MRI stable right greater than left subdural hematomas since the head CT The right side subdural demonstrates greater lobulation and signal heterogeneity measuring between 8 and 17 mm in thickness. The Stable mild intracranial mass effect with trace leftward midline shift. Normal basilar cisterns. Trace intraventricular hemorrhage with no ventriculomegaly. Type of Study: Bedside Swallow Evaluation Previous Swallow Assessment: (none) Diet Prior to this Study: NPO Temperature Spikes Noted: Yes Respiratory Status: Nasal cannula History of Recent Intubation: No Behavior/Cognition: Alert;Cooperative;Pleasant mood;Requires cueing Oral Cavity Assessment: Dry Oral Care Completed by SLP: Yes Oral Cavity - Dentition: Adequate natural dentition(some poor condition) Vision: Functional for self-feeding Self-Feeding Abilities: Needs set up;Needs assist Patient Positioning: Upright in bed Baseline Vocal Quality: Low vocal intensity Volitional Cough: Strong Volitional Swallow: Able to elicit    Oral/Motor/Sensory Function Overall Oral Motor/Sensory Function: Mild impairment Facial ROM: Reduced left;Suspected CN VII (facial) dysfunction(pt stat) Facial Symmetry: Abnormal symmetry left;Suspected CN VII (facial) dysfunction Facial Strength: Reduced left;Suspected CN VII (facial) dysfunction   Ice Chips Ice chips: Impaired Presentation: Spoon Oral Phase Impairments: Reduced lingual movement/coordination Oral Phase Functional Implications: Prolonged oral transit   Thin Liquid Thin Liquid:  Impaired  Oral Phase Impairments: Reduced labial seal Oral Phase Functional Implications: Right anterior spillage;Left anterior spillage Pharyngeal  Phase Impairments: Cough - Immediate;Throat Clearing - Delayed    Nectar Thick Nectar Thick Liquid: Not tested   Honey Thick Honey Thick Liquid: Not tested   Puree Puree: Within functional limits   Solid   GO   Solid: Not tested        [name] 12/30/2017,9:19 AM   [name].Ed [employer] [phone]

## 2017-12-30 NOTE — Progress Notes (Addendum)
Inpatient Diabetes Program Recommendations  AACE/ADA: New Consensus Statement on Inpatient Glycemic Control (2015)  Target Ranges:  Prepandial:   less than 140 mg/dL      Peak postprandial:   less than 180 mg/dL (1-2 hours)      Critically ill patients:  140 - 180 mg/dL   Lab Results  Component Value Date   GLUCAP 212 (H) 12/30/2017   HGBA1C 6.1 (H) 12/29/2017    Review of Glycemic Control Results for [name], [name] (MRN [id number]) as of 12/30/2017 09:24  Ref. Range 12/29/2017 21:17 12/30/2017 00:21 12/30/2017 04:15 12/30/2017 08:14  Glucose-Capillary Latest Ref Range: 65 - 99 mg/dL 186 (H) 183 (H) 191 (H) 212 (H)   Diabetes history: Type 2 DM Outpatient Diabetes medications: Metformin 1000 mg BID, Actos 45 mg QD, Lantus 50 units QD (per H&P) Current orders for Inpatient glycemic control: Novolog 0-9 units Q4H   Inpatient Diabetes Program Recommendations:    Would recommend adding Lantus 16 units QD.  Thanks, Bronson Curb, MSN, RNC-OB Diabetes Coordinator [phone] (8a-5p)

## 2017-12-30 NOTE — Significant Event (Signed)
Rapid Response Event Note  Overview: Time Called: 1401 Arrival Time: 1420 Event Type: Respiratory  Initial Focused Assessment: Patient with RAF on cardizem gtt at 15mg   HR 120-140s BP 188/85  RR 40s  O2 sats 97% on NRB Patient drowsy, not able to talk in complete sentences.  Per staff alert and conversant this am. Lung sounds decreased bases, heart tones irregular Temp 102.7 rectal  Interventions: Respiratory treatment given O2 changed to HFNC 15L ABG done Inderal 2mg  given IV  Dr Thereasa Solo and Dr Elwyn Reach at bedside to assess patient.  Transfer patient to [place] (if not transferred):  Event Summary: Name of Physician Notified: Dr Thereasa Solo at bedside at    Name of Consulting Physician Notified: Elwyn Reach at 1500  Outcome: Transferred (Comment)     Raliegh Ip

## 2017-12-30 NOTE — Progress Notes (Addendum)
Progress Note  Patient Name: ANTONIA CULBERTSON Date of Encounter: 12/30/2017  Primary Cardiologist: Rozann Lesches, MD   Subjective   Pt reports no pain with the exception of his right hand. He is unaware of his rhythm and rate.   Inpatient Medications    Scheduled Meds: . Chlorhexidine Gluconate Cloth  6 each Topical Daily  . furosemide  40 mg Intravenous Daily  . insulin aspart  0-9 Units Subcutaneous Q4H  . levalbuterol  0.63 mg Nebulization TID   And  . ipratropium  0.5 mg Nebulization TID  . mouth rinse  15 mL Mouth Rinse BID  . methimazole  10 mg Oral BID  . metoprolol tartrate  5 mg Intravenous Q6H  . pantoprazole  40 mg Oral QAC breakfast  . sodium chloride flush  10-40 mL Intracatheter Q12H   Continuous Infusions: . diltiazem (CARDIZEM) infusion 7.5 mg/hr (12/30/17 0834)  . levETIRAcetam Stopped (12/30/17 0525)  . piperacillin-tazobactam (ZOSYN)  IV 3.375 g (12/30/17 9381)  . vancomycin Stopped (12/30/17 0447)   PRN Meds: acetaminophen **OR** acetaminophen, haloperidol lactate, hydrALAZINE, levalbuterol, LORazepam, ondansetron **OR** ondansetron (ZOFRAN) IV, sodium chloride flush   Vital Signs    Vitals:   12/30/17 0818 12/30/17 0819 12/30/17 0831 12/30/17 0859  BP:  (!) 187/100    Pulse:      Resp: (!) 39 (!) 36 (!) 35   Temp:      TempSrc:      SpO2: 94% 95% 95% 96%  Weight:      Height:        Intake/Output Summary (Last 24 hours) at 12/30/2017 0904 Last data filed at 12/30/2017 0510 Gross per 24 hour  Intake 1452.5 ml  Output 3280 ml  Net -1827.5 ml   Filed Weights   12/28/17 2252 12/29/17 0500 12/30/17 0400  Weight: 266 lb 12.1 oz (121 kg) 257 lb 11.5 oz (116.9 kg) 261 lb 3.9 oz (118.5 kg)    Telemetry    Afib RVR in the 100-120s - Personally Reviewed  ECG    No new tracings - Personally Reviewed  Physical Exam   GEN: No acute distress.   Neck: No JVD Cardiac: RRR, no murmurs, rubs, or gallops.  Respiratory: Clear to auscultation  bilaterally. GI: Soft, nontender, non-distended  MS: No edema; No deformity. Neuro:  Nonfocal  Psych: Normal affect   Labs    Chemistry Recent Labs  Lab 11/28/2017 1615  12/28/17 0410 12/29/17 0430 12/30/17 0506  NA 141   < > 140 140 143  K 4.9   < > 3.6 3.2* 3.1*  CL 108   < > 103 105 104  CO2 18*   < > 25 24 27   GLUCOSE 164*   < > 167* 190* 246*  BUN 24*   < > 40* 29* 29*  CREATININE 1.26*   < > 1.24 1.44* 1.47*  CALCIUM 10.6*   < > 9.4 8.8* 9.5  PROT 7.0  --   --   --   --   ALBUMIN 3.4*  --   --   --   --   AST 14*  --   --   --   --   ALT 19  --   --   --   --   ALKPHOS 64  --   --   --   --   BILITOT 1.9*  --   --   --   --   GFRNONAA 57*   < >  58* 48* 47*  GFRAA >60   < > >60 56* 54*  ANIONGAP 15   < > 12 11 12    < > = values in this interval not displayed.     Hematology Recent Labs  Lab 12/28/17 0410 12/28/17 1626 12/29/17 0430 12/30/17 0506  WBC 10.0  --  12.7* 12.5*  RBC 2.82*  --  3.03* 3.40*  HGB 7.5* 8.7* 8.1* 9.0*  HCT 23.7* 26.2* 25.5* 29.0*  MCV 84.0  --  84.2 85.3  MCH 26.6  --  26.7 26.5  MCHC 31.6  --  31.8 31.0  RDW 14.3  --  14.7 15.1  PLT 221  --  310 322    Cardiac Enzymes Recent Labs  Lab 12/10/2017 1615 12/09/2017 2100 12/27/17 0231 12/27/17 0944  TROPONINI 0.06* 0.07* 0.17* 0.15*   No results for input(s): TROPIPOC in the last 168 hours.   BNP Recent Labs  Lab 12/09/2017 1616  BNP 476.0*     DDimer No results for input(s): DDIMER in the last 168 hours.   Radiology    Ct Abdomen Pelvis W Wo Contrast  Result Date: 12/28/2017 CLINICAL DATA:  Inpatient. History of right nephrectomy for kidney cancer. History of colon cancer in 2010. Abdominal pain. Fever. Hematuria. EXAM: CT ABDOMEN AND PELVIS WITHOUT AND WITH CONTRAST TECHNIQUE: Multidetector CT imaging of the abdomen and pelvis was performed following the standard protocol before and following the bolus administration of intravenous contrast. CONTRAST:  58mL ISOVUE-300  IOPAMIDOL (ISOVUE-300) INJECTION 61% COMPARISON:  None. FINDINGS: Lower chest: No significant pulmonary nodules or acute consolidative airspace disease. Hepatobiliary: Normal liver size. No liver mass. Cholelithiasis. No gallbladder distention, gallbladder wall thickening or pericholecystic fluid. No biliary ductal dilatation. Pancreas: Normal, with no mass or duct dilation. Spleen: Normal size. No mass. Adrenals/Urinary Tract: Normal adrenals. Right nephrectomy. No mass or fluid collection in the right nephrectomy bed. No left hydronephrosis. No left renal stones. Exophytic simple 1.4 cm renal cyst in the medial lower left kidney. No additional left renal lesions. Bladder collapsed by indwelling Foley catheter and not well evaluated. No bladder stones. Stomach/Bowel: Normal non-distended stomach. Normal caliber small bowel with no small bowel wall thickening. Appendix not discretely visualized. No pericecal inflammatory changes. Status post left hemicolectomy with end colostomy in the ventral right abdominal wall with Hartmann's pouch. No wall thickening or pericolonic fat stranding in the remnant right colon. Rectal catheter is in place within the Hartmann's pouch. There is nonspecific mild to moderate circumferential wall thickening throughout the rectum. Vascular/Lymphatic: Atherosclerotic nonaneurysmal abdominal aorta. Portal, splenic and left renal veins appear patent. No pathologically enlarged lymph nodes in the abdomen or pelvis. Reproductive: Moderately enlarged prostate. Other: No pneumoperitoneum, ascites or focal fluid collection. Musculoskeletal: No aggressive appearing focal osseous lesions. Marked lower lumbar spondylosis. IMPRESSION: 1. Left hemicolectomy with ventral right abdominal wall end colostomy. No evidence of bowel obstruction. 2. Mild-to-moderate circumferential wall thickening in the Hartmann's pouch, suggesting a nonspecific proctitis. 3. No free air.  No abscess. 4. Cholelithiasis.  No  CT findings of acute cholecystitis. 5. No evidence of metastatic disease in the abdomen or pelvis. 6. No evidence of tumor recurrence in the right nephrectomy bed. 7.  Aortic Atherosclerosis (ICD10-I70.0). 8. Moderately enlarged prostate. Electronically Signed   By: Ilona Sorrel M.D.   On: 12/28/2017 18:10   Ct Head Wo Contrast  Result Date: 12/28/2017 CLINICAL DATA:  Altered mental status. History of colon cancer, renal cell carcinoma, and atrial fibrillation on anticoagulation with supratherapeutic  INR. EXAM: CT HEAD WITHOUT CONTRAST TECHNIQUE: Contiguous axial images were obtained from the base of the skull through the vertex without intravenous contrast. COMPARISON:  None. FINDINGS: Brain: There is a mixed density subdural hematoma extending diffusely over the right cerebral convexity. A focally prominent collection of extra-axial blood in the right frontoparietal region measures 1.6 cm in thickness with a hematocrit level. The other portions of the subdural hematoma are smaller, with a predominantly low-density component over the right frontal convexity measuring 7 mm in thickness. There is very mild mass effect on the underlying right cerebral hemisphere with at most trace leftward midline shift. A small subdural collection over the left frontal convexity measures 7 mm in thickness and is hypoattenuating relative to cortex. There is a small amount of hyperattenuating blood along the falx and right tentorium. No acute large territory infarct is identified. The ventricles are normal in size. Periventricular white matter hypodensities are nonspecific but compatible with minimal chronic small vessel ischemic disease. Vascular: Calcified atherosclerosis at the skull base. No hyperdense vessel. Skull: No fracture or focal osseous lesion. Sinuses/Orbits: Paranasal sinuses and mastoid air cells are clear. Unremarkable orbits. Other: None. IMPRESSION: 1. Bilateral subdural hematomas, right larger than left. Focal  collection of blood in the right frontoparietal region with hematocrit level, ongoing active bleeding not excluded. Short interval follow-up recommended. 2. Minimal mass effect without significant midline shift. Critical Value/emergent results were called by telephone at the time of interpretation on 12/28/2017 at 4:59 pm to Dr. Louellen Molder , who verbally acknowledged these results. Electronically Signed   By: Logan Bores M.D.   On: 12/28/2017 17:05   Mr Brain Wo Contrast  Result Date: 12/29/2017 CLINICAL DATA:  72 year old male with altered mental status, supratherapeutic INR and bilateral subdural hematomas detected on noncontrast head CT yesterday. EXAM: MRI HEAD WITHOUT CONTRAST TECHNIQUE: Multiplanar, multiecho pulse sequences of the brain and surrounding structures were obtained without intravenous contrast. COMPARISON:  Head CT without contrast 12/28/2017 FINDINGS: The examination had to be discontinued prior to completion due to agitation common cephalopathy. Axial T1 and coronal T2 weighted imaging was not obtained, and sagittal T1 weighted imaging is motion degraded. Brain: Bilateral subdural hematomas. The left side subdural is smaller and demonstrates more homogeneous signal. This measures 6-7 millimeters in thickness at most levels. The right side subdural hematoma is larger, more lobulated, and demonstrates more signal heterogeneity. Areas of lobulation on the right measure between 12 and 17 millimeters in thickness. Elsewhere the right subdural is most pronounced along the anterior convexity measuring generally 8-10 millimeters in thickness. The basilar cisterns remain patent. There is stable trace leftward midline shift. There is trace intraventricular hemorrhage suspected (left occipital horn). No ventriculomegaly. No restricted diffusion or evidence of acute infarction. Mild for age nonspecific periventricular white matter T2 and FLAIR hyperintensity. No cortical encephalomalacia identified.  The deep gray matter nuclei, brainstem, and cerebellum appear normal for age. Grossly normal pituitary and cervicomedullary junction. Vascular: Major intracranial vascular flow voids are preserved. Skull and upper cervical spine: Visible bone marrow signal is within normal limits. Sinuses/Orbits: Stable and negative. Other: Visible internal auditory structures appear normal. Mastoid air cells remain clear. Grossly negative scalp and face soft tissues. IMPRESSION: 1. Stable right greater than left subdural hematomas since the head CT yesterday. The right side subdural demonstrates greater lobulation and signal heterogeneity measuring between 8 and 17 mm in thickness. The smaller more homogeneous left side subdural measures 6-7 mm in thickness. 2. Stable mild intracranial mass effect with  trace leftward midline shift. Normal basilar cisterns. Trace intraventricular hemorrhage with no ventriculomegaly. 3. No superimposed acute infarct. Electronically Signed   By: Genevie Ann M.D.   On: 12/29/2017 16:27   Dg Chest Port 1 View  Result Date: 12/29/2017 CLINICAL DATA:  Shortness of breath. EXAM: PORTABLE CHEST 1 VIEW COMPARISON:  Chest radiograph December 27, 2017 FINDINGS: Stable cardiomegaly. Calcified aortic knob. Fullness of the hila most consistent with vascular shadows. Similar interstitial prominence with small pleural effusions and apical capping. No pneumothorax. RIGHT PICC distal tip projects in distal superior vena cava. Soft tissue planes and included osseous structures are unchanged. IMPRESSION: Stable cardiomegaly and interstitial prominence compatible with pulmonary edema. Small pleural effusion. Stable appearance of RIGHT PICC. Aortic Atherosclerosis (ICD10-I70.0). Electronically Signed   By: Elon Alas M.D.   On: 12/29/2017 06:01    Cardiac Studies   Echo 12/27/17: ------------------------------------------------------------------- Study Conclusions - Left ventricle: The cavity size was normal.  There was moderate concentric hypertrophy. Systolic function was normal. The estimated ejection fraction was in the range of 60% to 65%. Possible akinesis of the basalinferior myocardium. Doppler parameters are consistent with high ventricular filling pressure. - Aortic valve: Trileaflet; mildly thickened, mildly calcified leaflets. - Aorta: Aortic root dimension: 37 mm (ED). - Aortic root: The aortic root was mildly dilated. - Mitral valve: There was trivial regurgitation. - Left atrium: The atrium was severely dilated. - Right atrium: The atrium was severely dilated. - Tricuspid valve: There was trivial regurgitation. - Pulmonary arteries: Systolic pressure could not be accurately estimated.  Patient Profile     72 y.o. male with a hx of atrial fibrillation previously on diltiazem and coumadin, HTN, HLD, thoracic aortic aneurysm, DM, CKD stage III, colon cancer (2010) s/p resection and colostomy, and renal cancer (2010) who  is being seen for atrial fibrillation with RVR.  Assessment & Plan    1. Permanent atrial fibrillation with RVR He continues on cardizem drip at 15mg /hr. He continues on scheduled IV lopressor. Per telemetry, his heart rate responds to the 100s with IV lopressor, but this rate improvement is not sustained. He is generally in the 100-120s. He is asymptomatic.    2. Hypertensive urgency Pressures remain elevated. Continue PRN hydralazine. He is NPO. Permissive range per neurology.   3. Bilateral subdural hematoma Hold AC. Per neurology.   4. Acute diastolic heart failure Continue lasix. sCr 1.47 (1.44). Continue diuresis.   5. Febrile with leukocytosis WBC 10.0 --> 12.7 --> 12.5 Afebrile overnight Unclear etiology. He is NPO. Concern for aspiration PNA. Acute illness may prolong his RVR. Defer to primary team. Continue vanc and zosyn.   For questions or updates, please contact [place] Please consult [email or web address] for contact info  under Cardiology/STEMI.      Signed, Tami Lin [place], PA  12/30/2017, 9:04 AM   As above, patient seen and examined.  Patient denies dyspnea but is on 100% rebreather.  He denies chest pain and is more alert today.  His heart rate remains elevated.  Continue IV Cardizem.  Increase IV metoprolol to 5 mg every 4 hours.  Follow heart rate and adjust regimen as needed.  Some of heart rate likely being driven by hyperadrenergic state associated with possible sepsis, withdrawal and fever.  No anticoagulation given recent subdural hematomas.  Patient appears to be mildly volume overloaded on examination.  Continue present dose of Lasix.  Follow renal function.  Continue antibiotics per primary care.  Patient remains tenuous. Kirk Ruths, MD

## 2017-12-30 NOTE — Procedures (Signed)
Intubation Procedure Note DADRIAN BALLANTINE [id number] 12-01-1945  Procedure: Intubation Indications: Airway protection and maintenance  Procedure Details Consent: Risks of procedure as well as the alternatives and risks of each were explained to the (patient/caregiver).  Consent for procedure obtained. Time Out: Verified patient identification, verified procedure, site/side was marked, verified correct patient position, special equipment/implants available, medications/allergies/relevent history reviewed, required imaging and test results available.  Performed  Maximum sterile technique was used including gloves, hand hygiene and mask.  MAC and 4    Evaluation Hemodynamic Status: BP stable throughout; O2 sats: stable throughout Patient's Current Condition: stable Complications: No apparent complications Patient did tolerate procedure well. Chest X-ray ordered to verify placement.  CXR: pending.   Marina Gravel [facility] 12/30/2017

## 2017-12-30 NOTE — Consult Note (Signed)
PULMONARY / CRITICAL CARE MEDICINE   Name: [name] MRN: [id number] DOB: 10/06/1945    ADMISSION DATE:  12/11/2017 CONSULTATION DATE: 12/30/2017  REFERRING MD:  Karie Kirks  CHIEF COMPLAINT:  Shortness of breath   HISTORY OF PRESENT ILLNESS:   Mr Alper is a 72 yr old male with history of a fib, stage 3 chronic kidney disease, DM2, adenocarcinoma of colon s/p resection and current colostomy, history of renal cell carcinoma s/o resection who was admitted on 12/02/2017 for shortnes s of breath, supratherputic INR,  afib with RVR and pulm edema. During his stay he was diagnosed with throtoxicosis and bilateral SDH and seziure. I was called this afternoon due to increased work of breathing and desaturation requiring NRFM and highflow [place]. I came to assess the patient on the floor. He is confused and not able to give much histroy. No family were around. He was on cardizem drip with HR 140-150 and HTN with SBP180. He looked in moderate distress.  Denies chest pain.     PAST MEDICAL HISTORY :  He  has a past medical history of Atrial fibrillation ([place]), Cancer ([place]), Chronic anticoagulation, Diabetes mellitus without complication ([place]), and Hypertension.  PAST SURGICAL HISTORY: He  has a past surgical history that includes Colostomy.  No Known Allergies  No current facility-administered medications on file prior to encounter.    Current Outpatient Medications on File Prior to Encounter  Medication Sig  . ANORO ELLIPTA 62.5-25 MCG/INH AEPB TAKE 1 PUFF BY MOUTH EVERY DAY  . budesonide-formoterol (SYMBICORT) 160-4.5 MCG/ACT inhaler Inhale 2 puffs into the lungs 2 (two) times daily.  Marland Kitchen CALCIUM PO Take 200 mg by mouth daily.   . Cholecalciferol 1000 units tablet Take 1,000 Units by mouth daily.  Marland Kitchen dicyclomine (BENTYL) 20 MG tablet Take 20 mg by mouth 3 (three) times daily as needed for spasms.   Marland Kitchen diltiazem (CARDIZEM) 60 MG tablet Take 60 mg by mouth 4 (four) times daily.   . DULoxetine  (CYMBALTA) 20 MG capsule Take 20 mg by mouth daily.  Marland Kitchen gabapentin (NEURONTIN) 300 MG capsule Take 300 mg by mouth daily.   Marland Kitchen HYDROcodone-acetaminophen (NORCO) 10-325 MG tablet Take 1 tablet by mouth daily as needed for moderate pain.   . IRON PO Take 325 mg by mouth daily.   Marland Kitchen LYRICA 100 MG capsule Take 100 mg by mouth 3 (three) times daily.   . metFORMIN (GLUCOPHAGE) 1000 MG tablet Take 1,000 mg by mouth 2 (two) times daily.   . Omega-3 Fatty Acids (FISH OIL PO) Take 1,000 mg by mouth daily.   . pioglitazone (ACTOS) 45 MG tablet Take 45 mg by mouth daily.   . Probiotic Product (PROBIOTIC PO) Take 1 tablet by mouth daily.   . simvastatin (ZOCOR) 80 MG tablet Take 40 mg by mouth daily.   Marland Kitchen warfarin (COUMADIN) 5 MG tablet Take 5-10 tablets by mouth as directed. 10 mg on M W TH FRI 5 mg TUES SAT    FAMILY HISTORY:  His indicated that the status of his mother is unknown. He indicated that the status of his father is unknown.   SOCIAL HISTORY: He  reports that he has quit smoking. He has never used smokeless tobacco. He reports that he drinks alcohol. He reports that he does not use drugs.  REVIEW OF SYSTEMS:   Could not be obtained due to altered mental status    VITAL SIGNS: BP (!) 188/106   Pulse (!) 119   Temp (!)  102.7 F (39.3 C) (Rectal)   Resp (!) 41   Ht 6\' 2"  (1.88 m)   Wt 118.5 kg (261 lb 3.9 oz)   SpO2 99%   BMI 33.54 kg/m   HEMODYNAMICS:    VENTILATOR SETTINGS:    INTAKE / OUTPUT: I/O last 3 completed shifts: In: 2878.5 [I.V.:565.7; Blood:702.8; Other:110; IV Piggyback:1500] Out: 4180 [Urine:4180]  PHYSICAL EXAMINATION: General:  Looks in moderate distress diaphoretic  Neuro:  Confused, moving all extremities CN 1-12 intact  HEENT:  Dry mucus membranes  Cardiovascular:  Irregularly irregular heart rate  Lungs:  Bilateral coarse crackles  Abdomen: soft no tenderness  Musculoskeletal:  No edema  Skin:  No rash   LABS:  BMET Recent Labs  Lab  12/28/17 0410 12/29/17 0430 12/30/17 0506  NA 140 140 143  K 3.6 3.2* 3.1*  CL 103 105 104  CO2 25 24 27   BUN 40* 29* 29*  CREATININE 1.24 1.44* 1.47*  GLUCOSE 167* 190* 246*    Electrolytes Recent Labs  Lab 12/27/17 0742 12/28/17 0410 12/28/17 1626 12/29/17 0430 12/30/17 0506  CALCIUM  --  9.4  --  8.8* 9.5  MG 1.6*  --  1.3*  --  2.4    CBC Recent Labs  Lab 12/28/17 0410 12/28/17 1626 12/29/17 0430 12/30/17 0506  WBC 10.0  --  12.7* 12.5*  HGB 7.5* 8.7* 8.1* 9.0*  HCT 23.7* 26.2* 25.5* 29.0*  PLT 221  --  310 322    Coag's Recent Labs  Lab 12/29/17 1007 12/29/17 1838 12/30/17 0506  INR 1.24 1.29 1.34    Sepsis Markers Recent Labs  Lab 12/09/2017 2100 12/07/2017 2310 12/27/17 0231  LATICACIDVEN 2.0* 2.3* 1.1    ABG Recent Labs  Lab 12/27/17 1249 12/29/17 1246 12/30/17 1459  PHART 7.496* 7.493* 7.509*  PCO2ART 31.5* 37.8 40.0  PO2ART 115* 107 176*    Liver Enzymes Recent Labs  Lab 12/09/2017 1615  AST 14*  ALT 19  ALKPHOS 64  BILITOT 1.9*  ALBUMIN 3.4*    Cardiac Enzymes Recent Labs  Lab 12/15/2017 2100 12/27/17 0231 12/27/17 0944  TROPONINI 0.07* 0.17* 0.15*    Glucose Recent Labs  Lab 12/29/17 0623 12/29/17 1201 12/29/17 2117 12/30/17 0021 12/30/17 0415 12/30/17 0814  GLUCAP 191* 161* 186* 183* 191* 212*    Imaging Mr Brain Wo Contrast  Result Date: 12/29/2017 CLINICAL DATA:  72 year old male with altered mental status, supratherapeutic INR and bilateral subdural hematomas detected on noncontrast head CT yesterday. EXAM: MRI HEAD WITHOUT CONTRAST TECHNIQUE: Multiplanar, multiecho pulse sequences of the brain and surrounding structures were obtained without intravenous contrast. COMPARISON:  Head CT without contrast 12/28/2017 FINDINGS: The examination had to be discontinued prior to completion due to agitation common cephalopathy. Axial T1 and coronal T2 weighted imaging was not obtained, and sagittal T1 weighted imaging  is motion degraded. Brain: Bilateral subdural hematomas. The left side subdural is smaller and demonstrates more homogeneous signal. This measures 6-7 millimeters in thickness at most levels. The right side subdural hematoma is larger, more lobulated, and demonstrates more signal heterogeneity. Areas of lobulation on the right measure between 12 and 17 millimeters in thickness. Elsewhere the right subdural is most pronounced along the anterior convexity measuring generally 8-10 millimeters in thickness. The basilar cisterns remain patent. There is stable trace leftward midline shift. There is trace intraventricular hemorrhage suspected (left occipital horn). No ventriculomegaly. No restricted diffusion or evidence of acute infarction. Mild for age nonspecific periventricular white matter T2 and FLAIR  hyperintensity. No cortical encephalomalacia identified. The deep gray matter nuclei, brainstem, and cerebellum appear normal for age. Grossly normal pituitary and cervicomedullary junction. Vascular: Major intracranial vascular flow voids are preserved. Skull and upper cervical spine: Visible bone marrow signal is within normal limits. Sinuses/Orbits: Stable and negative. Other: Visible internal auditory structures appear normal. Mastoid air cells remain clear. Grossly negative scalp and face soft tissues. IMPRESSION: 1. Stable right greater than left subdural hematomas since the head CT yesterday. The right side subdural demonstrates greater lobulation and signal heterogeneity measuring between 8 and 17 mm in thickness. The smaller more homogeneous left side subdural measures 6-7 mm in thickness. 2. Stable mild intracranial mass effect with trace leftward midline shift. Normal basilar cisterns. Trace intraventricular hemorrhage with no ventriculomegaly. 3. No superimposed acute infarct. Electronically Signed   By: Genevie Ann M.D.   On: 12/29/2017 16:27   Dg Swallowing Func-speech Pathology  Result Date:  12/30/2017 Objective Swallowing Evaluation: Type of Study: MBS-Modified Barium Swallow Study  Patient Details Name: ARVON SCHREINER MRN: [id number] Date of Birth: 1946/01/17 Today's Date: 12/30/2017 Time: SLP Start Time (ACUTE ONLY): 1208 -SLP Stop Time (ACUTE ONLY): 1223 SLP Time Calculation (min) (ACUTE ONLY): 15 min Past Medical History: Past Medical History: Diagnosis Date . Atrial fibrillation ([place])  . Cancer ([place])   colon 2010 . Chronic anticoagulation  . Diabetes mellitus without complication ([place])  . Hypertension  Past Surgical History: Past Surgical History: Procedure Laterality Date . COLOSTOMY   HPI: PIETRO BONURA is a 72 y.o. male with a history of a fib, stage 3 chron c kidney disease, DM2, adenocarcinoma of colon s/p resection and current colostomy, history of renal cell carcinoma s/o resection.  Patient presents with worsening shortness of breath over the past month with dyspnea that worsens with minimal exertion. MRI stable right greater than left subdural hematomas since the head CT The right side subdural demonstrates greater lobulation and signal heterogeneity measuring between 8 and 17 mm in thickness. The Stable mild intracranial mass effect with trace leftward midline shift. Normal basilar cisterns. Trace intraventricular hemorrhage with no ventriculomegaly. MBS recommended after BSE.  No data recorded Assessment / Plan / Recommendation CHL IP CLINICAL IMPRESSIONS 12/30/2017 Clinical Impression Moderate-severe oropharyngeal dysphagia exhibited with silent aspiration of thin and nectar consistencies. Impairments are motor based with weakness resulting in decreased hyolaryngeal anterior excursion and incomplete defection of epiglottis and airway protection leading to vallecular residue (max with puree) and mild-moderate pyriform sinus with aspiration occuring during initial swallow and subsequent swallows in attempts to clear residual. Unable to adequately clear residue with cues. Volitional throat  clear and cough are weak and ineffective all increasing aspiration risk. Recommend continue NPO with frequent oral care, pharyngeal strengthening exercises and RMT. MD consider PT/OT consult.  SLP Visit Diagnosis Dysphagia, oropharyngeal phase (R13.12) Attention and concentration deficit following -- Frontal lobe and executive function deficit following -- Impact on safety and function Moderate aspiration risk;Severe aspiration risk   CHL IP TREATMENT RECOMMENDATION 12/30/2017 Treatment Recommendations Therapy as outlined in treatment plan below   Prognosis 12/30/2017 Prognosis for Safe Diet Advancement Good Barriers to Reach Goals -- Barriers/Prognosis Comment -- CHL IP DIET RECOMMENDATION 12/30/2017 SLP Diet Recommendations NPO Liquid Administration via -- Medication Administration Via alternative means Compensations -- Postural Changes --   CHL IP OTHER RECOMMENDATIONS 12/30/2017 Recommended Consults -- Oral Care Recommendations Oral care QID Other Recommendations --   CHL IP FOLLOW UP RECOMMENDATIONS 12/30/2017 Follow up Recommendations Inpatient Rehab   CHL IP  FREQUENCY AND DURATION 12/30/2017 Speech Therapy Frequency (ACUTE ONLY) min 2x/week Treatment Duration 2 weeks      CHL IP ORAL PHASE 12/30/2017 Oral Phase Impaired Oral - Pudding Teaspoon -- Oral - Pudding Cup -- Oral - Honey Teaspoon -- Oral - Honey Cup -- Oral - Nectar Teaspoon -- Oral - Nectar Cup Decreased bolus cohesion;Lingual/palatal residue;Left pocketing in lateral sulci Oral - Nectar Straw -- Oral - Thin Teaspoon -- Oral - Thin Cup Decreased bolus cohesion;Lingual/palatal residue;Left pocketing in lateral sulci Oral - Thin Straw -- Oral - Puree Delayed oral transit;Lingual/palatal residue Oral - Mech Soft -- Oral - Regular -- Oral - Multi-Consistency -- Oral - Pill -- Oral Phase - Comment --  CHL IP PHARYNGEAL PHASE 12/30/2017 Pharyngeal Phase Impaired Pharyngeal- Pudding Teaspoon -- Pharyngeal -- Pharyngeal- Pudding Cup -- Pharyngeal -- Pharyngeal- Honey  Teaspoon -- Pharyngeal -- Pharyngeal- Honey Cup -- Pharyngeal -- Pharyngeal- Nectar Teaspoon -- Pharyngeal -- Pharyngeal- Nectar Cup Pharyngeal residue - pyriform;Pharyngeal residue - posterior pharnyx;Penetration/Aspiration during swallow;Reduced epiglottic inversion;Reduced anterior laryngeal mobility;Reduced laryngeal elevation;Reduced airway/laryngeal closure Pharyngeal Material enters airway, passes BELOW cords without attempt by patient to eject out (silent aspiration);Material enters airway, CONTACTS cords and not ejected out Pharyngeal- Nectar Straw -- Pharyngeal -- Pharyngeal- Thin Teaspoon -- Pharyngeal -- Pharyngeal- Thin Cup Penetration/Aspiration during swallow;Reduced epiglottic inversion;Reduced anterior laryngeal mobility;Reduced laryngeal elevation;Reduced airway/laryngeal closure;Pharyngeal residue - valleculae;Pharyngeal residue - pyriform Pharyngeal Material enters airway, passes BELOW cords without attempt by patient to eject out (silent aspiration);Material enters airway, CONTACTS cords and not ejected out Pharyngeal- Thin Straw -- Pharyngeal -- Pharyngeal- Puree Pharyngeal residue - pyriform;Pharyngeal residue - posterior pharnyx;Reduced epiglottic inversion;Reduced laryngeal elevation Pharyngeal -- Pharyngeal- Mechanical Soft -- Pharyngeal -- Pharyngeal- Regular -- Pharyngeal -- Pharyngeal- Multi-consistency -- Pharyngeal -- Pharyngeal- Pill -- Pharyngeal -- Pharyngeal Comment --  CHL IP CERVICAL ESOPHAGEAL PHASE 12/30/2017 Cervical Esophageal Phase WFL Pudding Teaspoon -- Pudding Cup -- Honey Teaspoon -- Honey Cup -- Nectar Teaspoon -- Nectar Cup -- Nectar Straw -- Thin Teaspoon -- Thin Cup -- Thin Straw -- Puree -- Mechanical Soft -- Regular -- Multi-consistency -- Pill -- Cervical Esophageal Comment -- No flowsheet data found. [name] 12/30/2017, 2:21 PM Orbie Pyo Colvin Caroli.Ed CCC-SLP Pager [phone]                STUDIES:  CT head 12/28/2017  1. Bilateral subdural  hematomas, right larger than left. Focal collection of blood in the right frontoparietal region with hematocrit level, ongoing active bleeding not excluded. Short interval follow-up recommended. 2. Minimal mass effect without significant midline shift.  CULTURES:  ANTIBIOTICS: Zosyn   SIGNIFICANT EVENTS: 12/30/2017 transfer to ICU for worsening resp failure and mental status with afib RVR     DISCUSSION: Patient is coming in with thyroid storm, atrial fibrillation with RVR, acute diastolic CHF, HTN emergency, acute metabolic encephalpathy secondary to thyrotoxicosis and bilateral SDH  ASSESSMENT / PLAN:  PULMONARY A: Acute hypoxemic resp failure Acute pulm edema  P:   - stat CXR - diuresis   - highflow nasal canula - transfer to ICU discussed with bedside nurse    CARDIOVASCULAR A:  HTN emergency Acute diastolic CHF pulm edema  Atrial fibrillation with RVR   P:  cardizem drip Diuresis Propranolol   RENAL A:   AKI Hypokalemia  P:   Replace K Diuresis gently  ?intravascular depletion   GASTROINTESTINAL A:      P:   Insert NGT if patient not able to take his PO meds  HEMATOLOGIC A:   Leukocytosis most likely reactive  P:  Empiric Abx  INFECTIOUS A:   No source of infection   P:   On empriric Abx due to fever and leukocytosis   ENDOCRINE A:   Thyroid storm  P:   Methimazole increase dose and frequency  hydrocortisone Iodine propranolol  NEUROLOGIC A:   Acute metabolic encephalopathy Bilateral SDH seizure  P:   Stat CT head Correct his tyroid storm On keppra Appreciate neuro follow up    FAMILY  - Updates: no family available   - Inter-disciplinary family meet or Palliative Care meeting due by:  12/30/2017    Pulmonary and [place] Pager: [phone]  12/30/2017, 3:36 PM

## 2017-12-31 ENCOUNTER — Inpatient Hospital Stay (HOSPITAL_COMMUNITY): Payer: Medicare Other

## 2017-12-31 LAB — COMPREHENSIVE METABOLIC PANEL
ALBUMIN: 2.4 g/dL — AB (ref 3.5–5.0)
ALT: 54 U/L (ref 17–63)
AST: 21 U/L (ref 15–41)
Alkaline Phosphatase: 56 U/L (ref 38–126)
Anion gap: 11 (ref 5–15)
BUN: 33 mg/dL — AB (ref 6–20)
CHLORIDE: 103 mmol/L (ref 101–111)
CO2: 28 mmol/L (ref 22–32)
Calcium: 9.2 mg/dL (ref 8.9–10.3)
Creatinine, Ser: 1.34 mg/dL — ABNORMAL HIGH (ref 0.61–1.24)
GFR calc Af Amer: >60 mL/min (ref >60)
GFR calc non Af Amer: 52 mL/min — ABNORMAL LOW (ref >60)
GLUCOSE: 254 mg/dL — AB (ref 65–99)
POTASSIUM: 3 mmol/L — AB (ref 3.5–5.1)
SODIUM: 142 mmol/L (ref 135–145)
Total Bilirubin: 3 mg/dL — ABNORMAL HIGH (ref 0.3–1.2)
Total Protein: 5.7 g/dL — ABNORMAL LOW (ref 6.5–8.1)

## 2017-12-31 LAB — CBC WITH DIFFERENTIAL/PLATELET
Basophils Absolute: 0 10*3/uL (ref 0.0–0.1)
Basophils Relative: 0 %
Eosinophils Absolute: 0 10*3/uL (ref 0.0–0.7)
Eosinophils Relative: 0 %
HCT: 27.6 % — ABNORMAL LOW (ref 39.0–52.0)
Hemoglobin: 8.5 g/dL — ABNORMAL LOW (ref 13.0–17.0)
LYMPHS ABS: 0.7 10*3/uL (ref 0.7–4.0)
LYMPHS PCT: 7 %
MCH: 26.7 pg (ref 26.0–34.0)
MCHC: 30.8 g/dL (ref 30.0–36.0)
MCV: 86.8 fL (ref 78.0–100.0)
MONO ABS: 0.7 10*3/uL (ref 0.1–1.0)
MONOS PCT: 8 %
Neutro Abs: 7.8 10*3/uL — ABNORMAL HIGH (ref 1.7–7.7)
Neutrophils Relative %: 85 %
Platelets: 244 10*3/uL (ref 150–400)
RBC: 3.18 MIL/uL — AB (ref 4.22–5.81)
RDW: 15 % (ref 11.5–15.5)
WBC: 9.2 10*3/uL (ref 4.0–10.5)

## 2017-12-31 LAB — GLUCOSE, CAPILLARY
GLUCOSE-CAPILLARY: 180 mg/dL — AB (ref 65–99)
GLUCOSE-CAPILLARY: 216 mg/dL — AB (ref 65–99)
GLUCOSE-CAPILLARY: 219 mg/dL — AB (ref 65–99)
GLUCOSE-CAPILLARY: 215 mg/dL — AB (ref 65–99)
Glucose-Capillary: 182 mg/dL — ABNORMAL HIGH (ref 65–99)
Glucose-Capillary: 184 mg/dL — ABNORMAL HIGH (ref 65–99)
Glucose-Capillary: 195 mg/dL — ABNORMAL HIGH (ref 65–99)
Glucose-Capillary: 222 mg/dL — ABNORMAL HIGH (ref 65–99)

## 2017-12-31 LAB — PHOSPHORUS
PHOSPHORUS: 4.5 mg/dL (ref 2.5–4.6)
PHOSPHORUS: 3.7 mg/dL (ref 2.5–4.6)
Phosphorus: 4.1 mg/dL (ref 2.5–4.6)

## 2017-12-31 LAB — MAGNESIUM
Magnesium: 2.3 mg/dL (ref 1.7–2.4)
Magnesium: 2.1 mg/dL (ref 1.7–2.4)
Magnesium: 1.8 mg/dL (ref 1.7–2.4)

## 2017-12-31 LAB — PROTIME-INR
INR: 1.36
Prothrombin Time: 16.6 seconds — ABNORMAL HIGH (ref 11.4–15.2)

## 2017-12-31 LAB — CULTURE, BLOOD (ROUTINE X 2)
Culture: NO GROWTH
Special Requests: ADEQUATE

## 2017-12-31 LAB — VANCOMYCIN, TROUGH: Vancomycin Tr: 11 ug/mL — ABNORMAL LOW (ref 15–20)

## 2017-12-31 LAB — TRIGLYCERIDES: Triglycerides: 706 mg/dL — ABNORMAL HIGH (ref <150)

## 2017-12-31 LAB — PROCALCITONIN

## 2017-12-31 MED ORDER — PRO-STAT SUGAR FREE PO LIQD
60.0000 mL | Freq: Four times a day (QID) | ORAL | Status: DC
  Administered 2017-12-31 – 2018-01-01 (×5): 60 mL
  Filled 2017-12-31 (×5): qty 60

## 2017-12-31 MED ORDER — VITAL HIGH PROTEIN PO LIQD
1000.0000 mL | ORAL | Status: DC
  Administered 2017-12-31: 1000 mL
  Filled 2017-12-31 (×2): qty 1000

## 2017-12-31 MED ORDER — POTASSIUM CHLORIDE 20 MEQ/15ML (10%) PO SOLN
40.0000 meq | Freq: Once | ORAL | Status: AC
  Administered 2017-12-31: 40 meq
  Filled 2017-12-31: qty 30

## 2017-12-31 MED ORDER — PANTOPRAZOLE SODIUM 40 MG PO PACK
40.0000 mg | PACK | Freq: Every day | ORAL | Status: DC
  Administered 2017-12-31 – 2018-01-05 (×5): 40 mg
  Filled 2017-12-31 (×5): qty 20

## 2017-12-31 MED ORDER — VANCOMYCIN HCL IN DEXTROSE 1-5 GM/200ML-% IV SOLN: 1000.0000 mg | Freq: Two times a day (BID) | INTRAVENOUS | Status: DC

## 2017-12-31 MED ORDER — DILTIAZEM 12 MG/ML ORAL SUSPENSION
60.0000 mg | Freq: Four times a day (QID) | ORAL | Status: DC
  Administered 2017-12-31 – 2018-01-01 (×5): 60 mg via ORAL
  Filled 2017-12-31 (×7): qty 6

## 2017-12-31 MED ORDER — PROPRANOLOL HCL 20 MG/5ML PO SOLN
40.0000 mg | ORAL | Status: DC
  Administered 2017-12-31 – 2018-01-05 (×25): 40 mg
  Filled 2017-12-31 (×34): qty 10

## 2017-12-31 MED ORDER — POTASSIUM CHLORIDE 10 MEQ/50ML IV SOLN
10.0000 meq | INTRAVENOUS | Status: AC
  Administered 2017-12-31 (×2): 10 meq via INTRAVENOUS
  Filled 2017-12-31 (×2): qty 50

## 2017-12-31 MED ORDER — VITAL HIGH PROTEIN PO LIQD
1000.0000 mL | ORAL | Status: DC
  Administered 2017-12-31 – 2018-01-01 (×2): 1000 mL
  Filled 2017-12-31 (×4): qty 1000

## 2017-12-31 MED ORDER — METHIMAZOLE 10 MG PO TABS
20.0000 mg | ORAL_TABLET | ORAL | Status: DC
  Administered 2017-12-31 – 2018-01-02 (×9): 20 mg
  Filled 2017-12-31 (×13): qty 2

## 2017-12-31 MED ORDER — PRO-STAT SUGAR FREE PO LIQD
30.0000 mL | Freq: Two times a day (BID) | ORAL | Status: DC
  Administered 2017-12-31: 30 mL
  Filled 2017-12-31: qty 30

## 2017-12-31 MED ORDER — FUROSEMIDE 10 MG/ML IJ SOLN
40.0000 mg | Freq: Once | INTRAMUSCULAR | Status: AC
  Administered 2017-12-31: 40 mg via INTRAVENOUS
  Filled 2017-12-31: qty 4

## 2017-12-31 NOTE — Progress Notes (Signed)
[facility] Progress Note Patient Name: [name] DOB: 1946-06-02 MRN: [id number]   Date of Service  12/31/2017  HPI/Events of Note  Needs restraints for safety/ prevent extubation due to episodic agitation. Also elevated Triglyceride levels yesterday without follow up.  eICU Interventions  Restraints ordered. Recheck triglycerides and if 750 mg or > will consider starting Dex and weaning off Propofol.        [name] 12/31/2017, 9:15 PM

## 2017-12-31 NOTE — Progress Notes (Addendum)
Progress Note  Patient Name: [name] Date of Encounter: 12/31/2017  Primary Cardiologist: Rozann Lesches, MD   Subjective   Pt intubated.   Inpatient Medications    Scheduled Meds: . chlorhexidine gluconate (MEDLINE KIT)  15 mL Mouth Rinse BID  . Chlorhexidine Gluconate Cloth  6 each Topical Daily  . furosemide  40 mg Intravenous Daily  . hydrocortisone sod succinate (SOLU-CORTEF) inj  100 mg Intravenous Q8H  . insulin aspart  0-9 Units Subcutaneous Q4H  . Iodine Strong (Lugols)  0.3 mL Oral Q6H  . levalbuterol  0.63 mg Nebulization TID   And  . ipratropium  0.5 mg Nebulization TID  . mouth rinse  15 mL Mouth Rinse 10 times per day  . methimazole  20 mg Oral Q4H  . pantoprazole sodium  40 mg Per Tube Daily  . propranolol  2 mg Intravenous Q4H  . sodium chloride flush  10-40 mL Intracatheter Q12H   Continuous Infusions: . sodium chloride 10 mL/hr at 12/31/17 0800  . acetaminophen    . diltiazem (CARDIZEM) infusion 15 mg/hr (12/31/17 0800)  . fentaNYL infusion INTRAVENOUS 125 mcg/hr (12/31/17 0800)  . levETIRAcetam Stopped (12/31/17 0459)  . piperacillin-tazobactam (ZOSYN)  IV 3.375 g (12/31/17 0604)  . propofol (DIPRIVAN) infusion 29.958 mcg/kg/min (12/31/17 0800)  . vancomycin     PRN Meds: [DISCONTINUED] acetaminophen **OR** acetaminophen, haloperidol lactate, hydrALAZINE, levalbuterol, LORazepam, ondansetron **OR** ondansetron (ZOFRAN) IV, sodium chloride flush   Vital Signs    Vitals:   12/31/17 0615 12/31/17 0700 12/31/17 0727 12/31/17 0745  BP:  132/77 132/80 124/70  Pulse: 99 97 88 78  Resp: '19 20 16 16  '$ Temp: 98.8 F (37.1 C) 99 F (37.2 C)  98.6 F (37 C)  TempSrc:      SpO2: 100% 100% 100% 100%  Weight:      Height:   6' (1.829 m)     Intake/Output Summary (Last 24 hours) at 12/31/2017 0844 Last data filed at 12/31/2017 0800 Gross per 24 hour  Intake 1958.45 ml  Output 1915 ml  Net 43.45 ml   Filed Weights   12/29/17 0500 12/30/17  0400 12/31/17 0436  Weight: 257 lb 11.5 oz (116.9 kg) 261 lb 3.9 oz (118.5 kg) 266 lb 5.1 oz (120.8 kg)    Telemetry    Better rate controlled, Afib in the 70s - Personally Reviewed  ECG    No new tracings - Personally Reviewed  Physical Exam   GEN: intubated and sedated.   Neck: difficult exam Cardiac: irergular rhythm, regular rate  Respiratory: intubated GI: Soft, nontender, non-distended  MS: + edema Neuro:  Nonfocal  Psych: sedated  Labs    Chemistry Recent Labs  Lab 12/15/2017 1615  12/29/17 0430 12/30/17 0506 12/31/17 0354  NA 141   < > 140 143 142  K 4.9   < > 3.2* 3.1* 3.0*  CL 108   < > 105 104 103  CO2 18*   < > '24 27 28  '$ GLUCOSE 164*   < > 190* 246* 254*  BUN 24*   < > 29* 29* 33*  CREATININE 1.26*   < > 1.44* 1.47* 1.34*  CALCIUM 10.6*   < > 8.8* 9.5 9.2  PROT 7.0  --   --   --  5.7*  ALBUMIN 3.4*  --   --   --  2.4*  AST 14*  --   --   --  21  ALT 19  --   --   --  54  ALKPHOS 64  --   --   --  56  BILITOT 1.9*  --   --   --  3.0*  GFRNONAA 57*   < > 48* 47* 52*  GFRAA >60   < > 56* 54* >60  ANIONGAP 15   < > '11 12 11   '$ < > = values in this interval not displayed.     Hematology Recent Labs  Lab 12/29/17 0430 12/30/17 0506 12/31/17 0354  WBC 12.7* 12.5* 9.2  RBC 3.03* 3.40* 3.18*  HGB 8.1* 9.0* 8.5*  HCT 25.5* 29.0* 27.6*  MCV 84.2 85.3 86.8  MCH 26.7 26.5 26.7  MCHC 31.8 31.0 30.8  RDW 14.7 15.1 15.0  PLT 310 322 244    Cardiac Enzymes Recent Labs  Lab 11/30/2017 1615 12/12/2017 2100 12/27/17 0231 12/27/17 0944  TROPONINI 0.06* 0.07* 0.17* 0.15*   No results for input(s): TROPIPOC in the last 168 hours.   BNP Recent Labs  Lab 12/14/2017 1616  BNP 476.0*     DDimer No results for input(s): DDIMER in the last 168 hours.   Radiology    Dg Chest 1 View  Result Date: 12/30/2017 CLINICAL DATA:  72 y/o  M; respiratory failure. EXAM: CHEST  1 VIEW COMPARISON:  12/30/2017 chest radiograph FINDINGS: Stable cardiomegaly given  projection and technique. Interval removal of enteric tube. Right PICC line tip projects over the mid SVC and is stable. Pulmonary venous hypertension. No consolidation, effusion, or pneumothorax. Bones are unremarkable. IMPRESSION: Stable cardiomegaly. Pulmonary venous hypertension. No focal consolidation. Electronically Signed   By: Kristine Garbe M.D.   On: 12/30/2017 19:36   Ct Head Wo Contrast  Result Date: 12/30/2017 CLINICAL DATA:  72 y/o M; altered mental status and bilateral subdural hematomas. EXAM: CT HEAD WITHOUT CONTRAST TECHNIQUE: Contiguous axial images were obtained from the base of the skull through the vertex without intravenous contrast. COMPARISON:  12/29/2017 MRI head.  12/28/2017 CT head. FINDINGS: Brain: Stable right larger than left mixed attenuation subdural hematoma. Stable mild associated mass effect with sulcal effacement and minimal right-to-left midline shift. No new intracranial hemorrhage, stroke, focal mass effect of brain parenchyma, or herniation. No hydrocephalus. Vascular: Calcific atherosclerosis of carotid siphons. No hyperdense vessel identified. Skull: Normal. Negative for fracture or focal lesion. Sinuses/Orbits: No acute finding. Other: None. IMPRESSION: 1. Stable right larger than left mixed attenuation subdural hematomas. Stable minimal associated mass effect. No herniation. 2. No new acute intracranial abnormality identified. Electronically Signed   By: Kristine Garbe M.D.   On: 12/30/2017 17:06   Mr Brain Wo Contrast  Result Date: 12/29/2017 CLINICAL DATA:  72 year old male with altered mental status, supratherapeutic INR and bilateral subdural hematomas detected on noncontrast head CT yesterday. EXAM: MRI HEAD WITHOUT CONTRAST TECHNIQUE: Multiplanar, multiecho pulse sequences of the brain and surrounding structures were obtained without intravenous contrast. COMPARISON:  Head CT without contrast 12/28/2017 FINDINGS: The examination had to be  discontinued prior to completion due to agitation common cephalopathy. Axial T1 and coronal T2 weighted imaging was not obtained, and sagittal T1 weighted imaging is motion degraded. Brain: Bilateral subdural hematomas. The left side subdural is smaller and demonstrates more homogeneous signal. This measures 6-7 millimeters in thickness at most levels. The right side subdural hematoma is larger, more lobulated, and demonstrates more signal heterogeneity. Areas of lobulation on the right measure between 12 and 17 millimeters in thickness. Elsewhere the right subdural is most pronounced along the anterior convexity measuring generally 8-10 millimeters in  thickness. The basilar cisterns remain patent. There is stable trace leftward midline shift. There is trace intraventricular hemorrhage suspected (left occipital horn). No ventriculomegaly. No restricted diffusion or evidence of acute infarction. Mild for age nonspecific periventricular white matter T2 and FLAIR hyperintensity. No cortical encephalomalacia identified. The deep gray matter nuclei, brainstem, and cerebellum appear normal for age. Grossly normal pituitary and cervicomedullary junction. Vascular: Major intracranial vascular flow voids are preserved. Skull and upper cervical spine: Visible bone marrow signal is within normal limits. Sinuses/Orbits: Stable and negative. Other: Visible internal auditory structures appear normal. Mastoid air cells remain clear. Grossly negative scalp and face soft tissues. IMPRESSION: 1. Stable right greater than left subdural hematomas since the head CT yesterday. The right side subdural demonstrates greater lobulation and signal heterogeneity measuring between 8 and 17 mm in thickness. The smaller more homogeneous left side subdural measures 6-7 mm in thickness. 2. Stable mild intracranial mass effect with trace leftward midline shift. Normal basilar cisterns. Trace intraventricular hemorrhage with no ventriculomegaly. 3.  No superimposed acute infarct. Electronically Signed   By: Genevie Ann M.D.   On: 12/29/2017 16:27   Dg Chest Port 1 View  Result Date: 12/30/2017 CLINICAL DATA:  Intubated EXAM: PORTABLE CHEST 1 VIEW COMPARISON:  Chest radiograph from one day prior. FINDINGS: Endotracheal tube tip is 3.6 cm above the carina. Enteric tube enters stomach with the tip not seen on this image. Right PICC terminates in the middle third of the SVC. Stable cardiomediastinal silhouette with mild cardiomegaly. No pneumothorax. No pleural effusion. Mild left basilar scarring versus atelectasis. No overt pulmonary edema. IMPRESSION: 1. Well-positioned support structures. 2. Mild left basilar scarring versus atelectasis. 3. Stable mild cardiomegaly without overt pulmonary edema. Electronically Signed   By: Ilona Sorrel M.D.   On: 12/30/2017 19:07   Dg Abd Portable 1v  Result Date: 12/30/2017 CLINICAL DATA:  OG tube placement. EXAM: PORTABLE ABDOMEN - 1 VIEW COMPARISON:  None. FINDINGS: OG tube is in place with both the tip and side-port in the stomach. IMPRESSION: OG tube in good position. Electronically Signed   By: Inge Rise M.D.   On: 12/30/2017 19:07   Dg Swallowing Func-speech Pathology  Result Date: 12/30/2017 Objective Swallowing Evaluation: Type of Study: MBS-Modified Barium Swallow Study  Patient Details Name: TRUMAINE WIMER MRN: [id number] Date of Birth: May 03, 1946 Today's Date: 12/30/2017 Time: SLP Start Time (ACUTE ONLY): 1208 -SLP Stop Time (ACUTE ONLY): 1223 SLP Time Calculation (min) (ACUTE ONLY): 15 min Past Medical History: Past Medical History: Diagnosis Date . Atrial fibrillation ([place])  . Cancer ([place])   colon 2010 . Chronic anticoagulation  . Diabetes mellitus without complication ([place])  . Hypertension  Past Surgical History: Past Surgical History: Procedure Laterality Date . COLOSTOMY   HPI: DAIRE OKIMOTO is a 72 y.o. male with a history of a fib, stage 3 chron c kidney disease, DM2, adenocarcinoma of colon s/p  resection and current colostomy, history of renal cell carcinoma s/o resection.  Patient presents with worsening shortness of breath over the past month with dyspnea that worsens with minimal exertion. MRI stable right greater than left subdural hematomas since the head CT The right side subdural demonstrates greater lobulation and signal heterogeneity measuring between 8 and 17 mm in thickness. The Stable mild intracranial mass effect with trace leftward midline shift. Normal basilar cisterns. Trace intraventricular hemorrhage with no ventriculomegaly. MBS recommended after BSE.  No data recorded Assessment / Plan / Recommendation CHL IP CLINICAL IMPRESSIONS 12/30/2017 Clinical Impression Moderate-severe  oropharyngeal dysphagia exhibited with silent aspiration of thin and nectar consistencies. Impairments are motor based with weakness resulting in decreased hyolaryngeal anterior excursion and incomplete defection of epiglottis and airway protection leading to vallecular residue (max with puree) and mild-moderate pyriform sinus with aspiration occuring during initial swallow and subsequent swallows in attempts to clear residual. Unable to adequately clear residue with cues. Volitional throat clear and cough are weak and ineffective all increasing aspiration risk. Recommend continue NPO with frequent oral care, pharyngeal strengthening exercises and RMT. MD consider PT/OT consult.  SLP Visit Diagnosis Dysphagia, oropharyngeal phase (R13.12) Attention and concentration deficit following -- Frontal lobe and executive function deficit following -- Impact on safety and function Moderate aspiration risk;Severe aspiration risk   CHL IP TREATMENT RECOMMENDATION 12/30/2017 Treatment Recommendations Therapy as outlined in treatment plan below   Prognosis 12/30/2017 Prognosis for Safe Diet Advancement Good Barriers to Reach Goals -- Barriers/Prognosis Comment -- CHL IP DIET RECOMMENDATION 12/30/2017 SLP Diet Recommendations NPO Liquid  Administration via -- Medication Administration Via alternative means Compensations -- Postural Changes --   CHL IP OTHER RECOMMENDATIONS 12/30/2017 Recommended Consults -- Oral Care Recommendations Oral care QID Other Recommendations --   CHL IP FOLLOW UP RECOMMENDATIONS 12/30/2017 Follow up Recommendations Inpatient Rehab   CHL IP FREQUENCY AND DURATION 12/30/2017 Speech Therapy Frequency (ACUTE ONLY) min 2x/week Treatment Duration 2 weeks      CHL IP ORAL PHASE 12/30/2017 Oral Phase Impaired Oral - Pudding Teaspoon -- Oral - Pudding Cup -- Oral - Honey Teaspoon -- Oral - Honey Cup -- Oral - Nectar Teaspoon -- Oral - Nectar Cup Decreased bolus cohesion;Lingual/palatal residue;Left pocketing in lateral sulci Oral - Nectar Straw -- Oral - Thin Teaspoon -- Oral - Thin Cup Decreased bolus cohesion;Lingual/palatal residue;Left pocketing in lateral sulci Oral - Thin Straw -- Oral - Puree Delayed oral transit;Lingual/palatal residue Oral - Mech Soft -- Oral - Regular -- Oral - Multi-Consistency -- Oral - Pill -- Oral Phase - Comment --  CHL IP PHARYNGEAL PHASE 12/30/2017 Pharyngeal Phase Impaired Pharyngeal- Pudding Teaspoon -- Pharyngeal -- Pharyngeal- Pudding Cup -- Pharyngeal -- Pharyngeal- Honey Teaspoon -- Pharyngeal -- Pharyngeal- Honey Cup -- Pharyngeal -- Pharyngeal- Nectar Teaspoon -- Pharyngeal -- Pharyngeal- Nectar Cup Pharyngeal residue - pyriform;Pharyngeal residue - posterior pharnyx;Penetration/Aspiration during swallow;Reduced epiglottic inversion;Reduced anterior laryngeal mobility;Reduced laryngeal elevation;Reduced airway/laryngeal closure Pharyngeal Material enters airway, passes BELOW cords without attempt by patient to eject out (silent aspiration);Material enters airway, CONTACTS cords and not ejected out Pharyngeal- Nectar Straw -- Pharyngeal -- Pharyngeal- Thin Teaspoon -- Pharyngeal -- Pharyngeal- Thin Cup Penetration/Aspiration during swallow;Reduced epiglottic inversion;Reduced anterior laryngeal  mobility;Reduced laryngeal elevation;Reduced airway/laryngeal closure;Pharyngeal residue - valleculae;Pharyngeal residue - pyriform Pharyngeal Material enters airway, passes BELOW cords without attempt by patient to eject out (silent aspiration);Material enters airway, CONTACTS cords and not ejected out Pharyngeal- Thin Straw -- Pharyngeal -- Pharyngeal- Puree Pharyngeal residue - pyriform;Pharyngeal residue - posterior pharnyx;Reduced epiglottic inversion;Reduced laryngeal elevation Pharyngeal -- Pharyngeal- Mechanical Soft -- Pharyngeal -- Pharyngeal- Regular -- Pharyngeal -- Pharyngeal- Multi-consistency -- Pharyngeal -- Pharyngeal- Pill -- Pharyngeal -- Pharyngeal Comment --  CHL IP CERVICAL ESOPHAGEAL PHASE 12/30/2017 Cervical Esophageal Phase WFL Pudding Teaspoon -- Pudding Cup -- Honey Teaspoon -- Honey Cup -- Nectar Teaspoon -- Nectar Cup -- Nectar Straw -- Thin Teaspoon -- Thin Cup -- Thin Straw -- Puree -- Mechanical Soft -- Regular -- Multi-consistency -- Pill -- Cervical Esophageal Comment -- No flowsheet data found. [name] 12/30/2017, 2:21 PM Orbie Pyo Colvin Caroli.Ed Engineer, agricultural  [phone]               Cardiac Studies   Echo 12/27/17: ------------------------------------------------------------------- Study Conclusions - Left ventricle: The cavity size was normal. There was moderate concentric hypertrophy. Systolic function was normal. The estimated ejection fraction was in the range of 60% to 65%. Possible akinesis of the basalinferior myocardium. Doppler parameters are consistent with high ventricular filling pressure. - Aortic valve: Trileaflet; mildly thickened, mildly calcified leaflets. - Aorta: Aortic root dimension: 37 mm (ED). - Aortic root: The aortic root was mildly dilated. - Mitral valve: There was trivial regurgitation. - Left atrium: The atrium was severely dilated. - Right atrium: The atrium was severely dilated. - Tricuspid valve: There was  trivial regurgitation. - Pulmonary arteries: Systolic pressure could not be accurately estimated.  Patient Profile     72 y.o. male with a hx of atrial fibrillation previously on diltiazem and coumadin, HTN, HLD, thoracic aortic aneurysm, DM, CKD stage III, colon cancer (2010) s/p resection and colostomy, and renal cancer (2010)who  is being seen foratrial fibrillation with RVR.  Assessment & Plan    1. Permanent atrial fibrillation with RVR Continues on cardizem drip at 15 mg/hr. Lopressor increased to 5 mg IV q 4 hr yesterday. Telemetry with HR much better rate-controlled. 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 Propranolol, iodine, and methimazole started yesterday. Will discuss with MD D/C lopressor with propranolol on board.    2. Respiratory failure Pt intubated yesterday for airway protection.    3. Hypertension Pressures much better controlled. Continue current regimen of IV hydralazine.   4. Acute diastolic heart failure Pt with multiple drips, continue lasix as ordered. Overall net negative 5L with 3L urine output yesterday.    5. Bilateral subdural hematoma, seizure Hold anticoagulation.   For questions or updates, please contact [place] Please consult [email or web address] for contact info under Cardiology/STEMI.      Signed, Tami Lin Duke, PA  12/31/2017, 8:44 AM   As above, patient seen and examined.  Events noted.  He is now intubated and sedated.  He remains in atrial fibrillation.  Continue Cardizem and propranolol for rate control.  No anticoagulation given subdural hematomas.  Patient on methimazole, hydrocortisone and Lugol's solution for hyperthyroidism.  Other issues per critical care medicine. Kirk Ruths, MD

## 2017-12-31 NOTE — Progress Notes (Signed)
E link notified of triglicerides elevated, propofol infusing. No new order at this time, will continue to monitor.

## 2017-12-31 NOTE — Progress Notes (Signed)
Barcode not reading  Medication given after checking name, DOB and MRN

## 2017-12-31 NOTE — Progress Notes (Signed)
Pharmacy Antibiotic Note  [name] is a 72 y.o. male admitted on 12/15/2017 with sepsis.  Pharmacy has been consulted for Vancomycin dosing. Empiric anti-biotics for fever/mild leukocytosis. Scr a little better this AM.   Vancomycin trough this AM is sub-therapeutic at 11  Plan: -Increase vancomycin to 1000 mg IV q12h (from 1500 mg IV q24h) -Re-check vancomycin trough prior to 4th dose or with any changes in renal function   Height: 6\' 2"  (188 cm) Weight: 266 lb 5.1 oz (120.8 kg) IBW/kg (Calculated) : 82.2  Temp (24hrs), Avg:99.7 F (37.6 C), Min:98.2 F (36.8 C), Max:102.7 F (39.3 C)  Recent Labs  Lab 12/20/2017 2100 12/01/2017 2310 12/27/17 0231 12/28/17 0410 12/29/17 0430 12/30/17 0506 12/30/17 1942 12/31/17 0151 12/31/17 0354  WBC  --   --  11.6* 10.0 12.7* 12.5*  --   --  9.2  CREATININE 1.28*  --  1.39* 1.24 1.44* 1.47*  --   --  1.34*  LATICACIDVEN 2.0* 2.3* 1.1  --   --   --  1.0  --   --   VANCOTROUGH  --   --   --   --   --   --   --  11*  --     Estimated Creatinine Clearance: 71.8 mL/min (A) (by C-G formula based on SCr of 1.34 mg/dL (H)).    No Known Allergies   Narda Bonds 12/31/2017 5:31 AM

## 2017-12-31 NOTE — Progress Notes (Signed)
Subjective: Patient intubated yesterday, sedated with propofol this am.   Exam: Vitals:   12/31/17 0727 12/31/17 0745  BP: 132/80 124/70  Pulse: 88 78  Resp: 16 16  Temp:  98.6 F (37 C)  SpO2: 100% 100%   Gen: In bed, intubated Resp: ventilated Abd: soft, nt  Neuro: MS: awakens to voice, follows commands.  YX:AJLUNGB eye opening equally, does not participate with EOM, PERRL Motor: moves all extremities relatively equally, though unable to fully assess due to sedation.  Sensory:responds to stim x 4.   Pertinent Labs: Borderline Cr at 1.34 INR 1.36 Flu negative Mild leukocytosis   Impression: 72 yo M with new onset seizures in the setting of metabolic abnormalities(particularly thyroid storm) as well as subdural hematoma. He has been mildly encephalopathic for the past few days which I think is more related to his overall medical condition than the SDH. With seizures int eh settin gof SDH, however, I would continue antiepileptic therapy, at least until the SDH resolves, though he may not need lifelong therapy.   Recommendations: 1) Continue keppra(max dose renally) 2) Continue to optimize his medical conditions.  3) continue to hold anticoagulation(SDH stable)    Roland Rack, MD [facility] [phone]  If 7pm- 7am, please page neurology on call as listed in [place].

## 2017-12-31 NOTE — Progress Notes (Signed)
Inpatient Diabetes Program Recommendations  AACE/ADA: New Consensus Statement on Inpatient Glycemic Control (2015)  Target Ranges:  Prepandial:   less than 140 mg/dL      Peak postprandial:   less than 180 mg/dL (1-2 hours)      Critically ill patients:  140 - 180 mg/dL  Results for Dwayne, Shea (MRN [id number]) as of 12/31/2017 09:40  Ref. Range 12/30/2017 08:14 12/30/2017 11:32 12/30/2017 16:54 12/30/2017 19:40 12/30/2017 23:28 12/31/2017 03:37 12/31/2017 07:43  Glucose-Capillary Latest Ref Range: 65 - 99 mg/dL 212 (H)  Novolog 3 units 184 (H)  Novolog 2 units 183 (H) 201 (H)  Novolog 3 units 192 (H)  Novolog 3 units 216 (H)  Novolog 3 units 182 (H)  Novolog 2 units   Results for Dwayne, Shea (MRN [id number]) as of 12/31/2017 09:40  Ref. Range 12/29/2017 09:00  Hemoglobin A1C Latest Ref Range: 4.8 - 5.6 % 6.1 (H)    Review of Glycemic Control  Diabetes history: DM2 Outpatient Diabetes medications: Metformin 1000 mg BID, Actos 45 mg daily Current orders for Inpatient glycemic control: Novolog 0-9 units Q4H; Solucortef 100 mg Q8H  Inpatient Diabetes Program Recommendations:  Insulin - Basal: Over the past 24 hours, glucose has ranged from 182-216 mg/dl and patient has received a total of Novolog 16 units for correction. If steroids are continued, please consider ordering Lantus 10 units Q24H.  Thanks, Barnie Alderman, RN, MSN, CDE Diabetes Coordinator Inpatient Diabetes Program [phone] (Team Pager from 8am to 5pm)

## 2017-12-31 NOTE — Progress Notes (Signed)
PULMONARY / CRITICAL CARE MEDICINE   Name: [name] MRN: [id number] DOB: [birth year]    ADMISSION DATE:  12/21/2017 CONSULTATION DATE: 12/30/2017  REFERRING MD:  Karie Kirks  CHIEF COMPLAINT:  Shortness of breath   HISTORY OF PRESENT ILLNESS:   [name] is a 72 yr old male with history of a fib, stage 3 chronic kidney disease, DM2, adenocarcinoma of colon s/p resection and current colostomy, history of renal cell carcinoma s/o resection who was admitted on 12/11/2017 for shortnes s of breath, supratherputic INR,  afib with RVR and pulm edema. During his stay he was diagnosed with thyrotoxicosis and bilateral SDH and seizure. On 4/2 PCCM consulted for increased work of breathing, hypoxia, AMS.  He was intubated and tx to ICU.      Subjective: Intubated overnight.   VITAL SIGNS: BP 122/72 (BP Location: Left Arm)   Pulse 69   Temp 99 F (37.2 C)   Resp 16   Ht 6' (1.829 m)   Wt 120.8 kg (266 lb 5.1 oz)   SpO2 100%   BMI 36.12 kg/m   HEMODYNAMICS:    VENTILATOR SETTINGS: Vent Mode: PRVC FiO2 (%):  [40 %] 40 % Set Rate:  [16 bmp] 16 bmp Vt Set:  [500 mL] 500 mL PEEP:  [5 cmH20] 5 cmH20 Plateau Pressure:  [11 cmH20-14 cmH20] 11 cmH20  INTAKE / OUTPUT: I/O last 3 completed shifts: In: 3294.8 [I.V.:1344.8; IV Piggyback:1950] Out: 7829 [Urine:3720; Stool:50]  PHYSICAL EXAMINATION: General: wdwn male, NAD on vent  Neuro:  Sedated, RASS -2 HEENT: mm moist, ETT  Cardiovascular:  Irregularly irregular heart rate  Lungs:  Bilateral coarse crackles  Abdomen: soft no tenderness  Musculoskeletal:  No edema  Skin:  No rash   LABS:  BMET Recent Labs  Lab 12/29/17 0430 12/30/17 0506 12/31/17 0354  NA 140 143 142  K 3.2* 3.1* 3.0*  CL 105 104 103  CO2 24 27 28   BUN 29* 29* 33*  CREATININE 1.44* 1.47* 1.34*  GLUCOSE 190* 246* 254*    Electrolytes Recent Labs  Lab 12/28/17 1626 12/29/17 0430 12/30/17 0506 12/31/17 0354  CALCIUM  --  8.8* 9.5 9.2  MG 1.3*  --   2.4 2.3  PHOS  --   --   --  4.5    CBC Recent Labs  Lab 12/29/17 0430 12/30/17 0506 12/31/17 0354  WBC 12.7* 12.5* 9.2  HGB 8.1* 9.0* 8.5*  HCT 25.5* 29.0* 27.6*  PLT 310 322 244    Coag's Recent Labs  Lab 12/29/17 1838 12/30/17 0506 12/31/17 0354  INR 1.29 1.34 1.36    Sepsis Markers Recent Labs  Lab 12/04/2017 2310 12/27/17 0231 12/30/17 1942  LATICACIDVEN 2.3* 1.1 1.0    ABG Recent Labs  Lab 12/27/17 1249 12/29/17 1246 12/30/17 1459  PHART 7.496* 7.493* 7.509*  PCO2ART 31.5* 37.8 40.0  PO2ART 115* 107 176*    Liver Enzymes Recent Labs  Lab 12/05/2017 1615 12/31/17 0354  AST 14* 21  ALT 19 54  ALKPHOS 64 56  BILITOT 1.9* 3.0*  ALBUMIN 3.4* 2.4*    Cardiac Enzymes Recent Labs  Lab 12/05/2017 2100 12/27/17 0231 12/27/17 0944  TROPONINI 0.07* 0.17* 0.15*    Glucose Recent Labs  Lab 12/30/17 1132 12/30/17 1654 12/30/17 1940 12/30/17 2328 12/31/17 0337 12/31/17 0743  GLUCAP 184* 183* 201* 192* 216* 182*    Imaging Dg Chest 1 View  Result Date: 12/30/2017 CLINICAL DATA:  72 y/o  M; respiratory failure. EXAM: CHEST  1 VIEW COMPARISON:  12/30/2017 chest radiograph FINDINGS: Stable cardiomegaly given projection and technique. Interval removal of enteric tube. Right PICC line tip projects over the mid SVC and is stable. Pulmonary venous hypertension. No consolidation, effusion, or pneumothorax. Bones are unremarkable. IMPRESSION: Stable cardiomegaly. Pulmonary venous hypertension. No focal consolidation. Electronically Signed   By: Kristine Garbe M.D.   On: 12/30/2017 19:36   Ct Head Wo Contrast  Result Date: 12/30/2017 CLINICAL DATA:  72 y/o M; altered mental status and bilateral subdural hematomas. EXAM: CT HEAD WITHOUT CONTRAST TECHNIQUE: Contiguous axial images were obtained from the base of the skull through the vertex without intravenous contrast. COMPARISON:  12/29/2017 MRI head.  12/28/2017 CT head. FINDINGS: Brain: Stable right  larger than left mixed attenuation subdural hematoma. Stable mild associated mass effect with sulcal effacement and minimal right-to-left midline shift. No new intracranial hemorrhage, stroke, focal mass effect of brain parenchyma, or herniation. No hydrocephalus. Vascular: Calcific atherosclerosis of carotid siphons. No hyperdense vessel identified. Skull: Normal. Negative for fracture or focal lesion. Sinuses/Orbits: No acute finding. Other: None. IMPRESSION: 1. Stable right larger than left mixed attenuation subdural hematomas. Stable minimal associated mass effect. No herniation. 2. No new acute intracranial abnormality identified. Electronically Signed   By: Kristine Garbe M.D.   On: 12/30/2017 17:06   Dg Chest Port 1 View  Result Date: 12/31/2017 CLINICAL DATA:  Respiratory failure.  Shortness of breath. EXAM: PORTABLE CHEST 1 VIEW COMPARISON:  One-view chest x-ray 12/30/2017 FINDINGS: The patient is now intubated. Endotracheal tube terminates 6 cm above the carina, in satisfactory position. The side port of the NG tube is in the fundus of the stomach. Heart size is exaggerated by low lung volumes. Mild pulmonary vascular congestion is present without frank edema. There are no effusions. No focal airspace consolidation is present. The visualized soft tissues and bony thorax are unremarkable. IMPRESSION: 1. Interval intubation. Satisfactory positioning of the endotracheal tube and NG tube. 2. Low lung volumes and mild pulmonary vascular congestion. Electronically Signed   By: San Morelle M.D.   On: 12/31/2017 09:06   Dg Chest Port 1 View  Result Date: 12/30/2017 CLINICAL DATA:  Intubated EXAM: PORTABLE CHEST 1 VIEW COMPARISON:  Chest radiograph from one day prior. FINDINGS: Endotracheal tube tip is 3.6 cm above the carina. Enteric tube enters stomach with the tip not seen on this image. Right PICC terminates in the middle third of the SVC. Stable cardiomediastinal silhouette with mild  cardiomegaly. No pneumothorax. No pleural effusion. Mild left basilar scarring versus atelectasis. No overt pulmonary edema. IMPRESSION: 1. Well-positioned support structures. 2. Mild left basilar scarring versus atelectasis. 3. Stable mild cardiomegaly without overt pulmonary edema. Electronically Signed   By: Ilona Sorrel M.D.   On: 12/30/2017 19:07   Dg Abd Portable 1v  Result Date: 12/30/2017 CLINICAL DATA:  OG tube placement. EXAM: PORTABLE ABDOMEN - 1 VIEW COMPARISON:  None. FINDINGS: OG tube is in place with both the tip and side-port in the stomach. IMPRESSION: OG tube in good position. Electronically Signed   By: Inge Rise M.D.   On: 12/30/2017 19:07   Dg Swallowing Func-speech Pathology  Result Date: 12/30/2017 Objective Swallowing Evaluation: Type of Study: MBS-Modified Barium Swallow Study  Patient Details Name: MARRIO SCRIBNER MRN: [id number] Date of Birth: Oct 28, 1945 Today's Date: 12/30/2017 Time: SLP Start Time (ACUTE ONLY): 1208 -SLP Stop Time (ACUTE ONLY): 1223 SLP Time Calculation (min) (ACUTE ONLY): 15 min Past Medical History: Past Medical History: Diagnosis Date . Atrial  fibrillation ([place])  . Cancer ([place])   colon 2010 . Chronic anticoagulation  . Diabetes mellitus without complication ([place])  . Hypertension  Past Surgical History: Past Surgical History: Procedure Laterality Date . COLOSTOMY   HPI: [name] is a 72 y.o. male with a history of a fib, stage 3 chron c kidney disease, DM2, adenocarcinoma of colon s/p resection and current colostomy, history of renal cell carcinoma s/o resection.  Patient presents with worsening shortness of breath over the past month with dyspnea that worsens with minimal exertion. MRI stable right greater than left subdural hematomas since the head CT The right side subdural demonstrates greater lobulation and signal heterogeneity measuring between 8 and 17 mm in thickness. The Stable mild intracranial mass effect with trace leftward midline shift.  Normal basilar cisterns. Trace intraventricular hemorrhage with no ventriculomegaly. MBS recommended after BSE.  No data recorded Assessment / Plan / Recommendation CHL IP CLINICAL IMPRESSIONS 12/30/2017 Clinical Impression Moderate-severe oropharyngeal dysphagia exhibited with silent aspiration of thin and nectar consistencies. Impairments are motor based with weakness resulting in decreased hyolaryngeal anterior excursion and incomplete defection of epiglottis and airway protection leading to vallecular residue (max with puree) and mild-moderate pyriform sinus with aspiration occuring during initial swallow and subsequent swallows in attempts to clear residual. Unable to adequately clear residue with cues. Volitional throat clear and cough are weak and ineffective all increasing aspiration risk. Recommend continue NPO with frequent oral care, pharyngeal strengthening exercises and RMT. MD consider PT/OT consult.  SLP Visit Diagnosis Dysphagia, oropharyngeal phase (R13.12) Attention and concentration deficit following -- Frontal lobe and executive function deficit following -- Impact on safety and function Moderate aspiration risk;Severe aspiration risk   CHL IP TREATMENT RECOMMENDATION 12/30/2017 Treatment Recommendations Therapy as outlined in treatment plan below   Prognosis 12/30/2017 Prognosis for Safe Diet Advancement Good Barriers to Reach Goals -- Barriers/Prognosis Comment -- CHL IP DIET RECOMMENDATION 12/30/2017 SLP Diet Recommendations NPO Liquid Administration via -- Medication Administration Via alternative means Compensations -- Postural Changes --   CHL IP OTHER RECOMMENDATIONS 12/30/2017 Recommended Consults -- Oral Care Recommendations Oral care QID Other Recommendations --   CHL IP FOLLOW UP RECOMMENDATIONS 12/30/2017 Follow up Recommendations Inpatient Rehab   CHL IP FREQUENCY AND DURATION 12/30/2017 Speech Therapy Frequency (ACUTE ONLY) min 2x/week Treatment Duration 2 weeks      CHL IP ORAL PHASE 12/30/2017  Oral Phase Impaired Oral - Pudding Teaspoon -- Oral - Pudding Cup -- Oral - Honey Teaspoon -- Oral - Honey Cup -- Oral - Nectar Teaspoon -- Oral - Nectar Cup Decreased bolus cohesion;Lingual/palatal residue;Left pocketing in lateral sulci Oral - Nectar Straw -- Oral - Thin Teaspoon -- Oral - Thin Cup Decreased bolus cohesion;Lingual/palatal residue;Left pocketing in lateral sulci Oral - Thin Straw -- Oral - Puree Delayed oral transit;Lingual/palatal residue Oral - Mech Soft -- Oral - Regular -- Oral - Multi-Consistency -- Oral - Pill -- Oral Phase - Comment --  CHL IP PHARYNGEAL PHASE 12/30/2017 Pharyngeal Phase Impaired Pharyngeal- Pudding Teaspoon -- Pharyngeal -- Pharyngeal- Pudding Cup -- Pharyngeal -- Pharyngeal- Honey Teaspoon -- Pharyngeal -- Pharyngeal- Honey Cup -- Pharyngeal -- Pharyngeal- Nectar Teaspoon -- Pharyngeal -- Pharyngeal- Nectar Cup Pharyngeal residue - pyriform;Pharyngeal residue - posterior pharnyx;Penetration/Aspiration during swallow;Reduced epiglottic inversion;Reduced anterior laryngeal mobility;Reduced laryngeal elevation;Reduced airway/laryngeal closure Pharyngeal Material enters airway, passes BELOW cords without attempt by patient to eject out (silent aspiration);Material enters airway, CONTACTS cords and not ejected out Pharyngeal- Nectar Straw -- Pharyngeal -- Pharyngeal- Thin Teaspoon -- Pharyngeal --  Pharyngeal- Thin Cup Penetration/Aspiration during swallow;Reduced epiglottic inversion;Reduced anterior laryngeal mobility;Reduced laryngeal elevation;Reduced airway/laryngeal closure;Pharyngeal residue - valleculae;Pharyngeal residue - pyriform Pharyngeal Material enters airway, passes BELOW cords without attempt by patient to eject out (silent aspiration);Material enters airway, CONTACTS cords and not ejected out Pharyngeal- Thin Straw -- Pharyngeal -- Pharyngeal- Puree Pharyngeal residue - pyriform;Pharyngeal residue - posterior pharnyx;Reduced epiglottic inversion;Reduced laryngeal  elevation Pharyngeal -- Pharyngeal- Mechanical Soft -- Pharyngeal -- Pharyngeal- Regular -- Pharyngeal -- Pharyngeal- Multi-consistency -- Pharyngeal -- Pharyngeal- Pill -- Pharyngeal -- Pharyngeal Comment --  CHL IP CERVICAL ESOPHAGEAL PHASE 12/30/2017 Cervical Esophageal Phase WFL Pudding Teaspoon -- Pudding Cup -- Honey Teaspoon -- Honey Cup -- Nectar Teaspoon -- Nectar Cup -- Nectar Straw -- Thin Teaspoon -- Thin Cup -- Thin Straw -- Puree -- Mechanical Soft -- Regular -- Multi-consistency -- Pill -- Cervical Esophageal Comment -- No flowsheet data found. [name] 12/30/2017, 2:21 PM Orbie Pyo Colvin Caroli.Ed CCC-SLP Pager [phone]                STUDIES:  CT head 12/28/2017 >>1. Bilateral subdural hematomas, right larger than left. Focal collection of blood in the right frontoparietal region with hematocrit level, ongoing active bleeding not excluded. Short interval follow-up recommended. 2. Minimal mass effect without significant midline shift. MRI brian 4/1>>> 1. Stable right greater than left subdural hematomas since the head CT yesterday.  The right side subdural demonstrates greater lobulation and signal heterogeneity measuring between 8 and 17 mm in thickness. The smaller more homogeneous left side subdural measures 6-7 mm in thickness. 2. Stable mild intracranial mass effect with trace leftward midline shift. Normal basilar cisterns. Trace intraventricular hemorrhage with no ventriculomegaly. 3. No superimposed acute infarct. CT head 4/2>>> 1. Stable right larger than left mixed attenuation subdural hematomas. Stable minimal associated mass effect. No herniation. 2. No new acute intracranial abnormality identified.  CULTURES: BC x 2 3/29>>> 1/2 coag neg staph>> suspect contaminant  BC x 2 4/1>>> neg   LINES/TUBES:  ETT 4/2>>> RUA PICC 3/30>>>  ANTIBIOTICS: Zosyn 3/30>>> Vanc 3/31>>>4/3  SIGNIFICANT EVENTS: 12/30/2017 transfer to ICU for worsening resp failure and  mental status with afib RVR    DISCUSSION: Patient is coming in with thyroid storm, atrial fibrillation with RVR, acute diastolic CHF, HTN emergency, acute metabolic encephalpathy secondary to thyrotoxicosis and bilateral SDH  ASSESSMENT / PLAN:  PULMONARY A: Acute hypoxemic resp failure Acute pulm edema ?aspiration PNA - less likely  P:   Vent support - 8cc/kg  F/u CXR  F/u ABG Daily SBT  Empiric abx as above  Check pct  HR control  Diuresis as BP and Scr tol    CARDIOVASCULAR A:  HTN emergency Acute dias/tolic CHF pulm edema  Atrial fibrillation with RVR  2D echo 3//30>> EF 600-65%, mod hypertrophy, trivial Dwayne, trivial TR, severely dilated RA and LA P:  cardizem drip Diuresis as above  Propranolol as below - ok to change to PO  Cards following   RENAL A:   AKI Hypokalemia  P:   Replace K Diuresis gently   f/u chem   GASTROINTESTINAL No active issue   P:   PPI  Will add TF 4/3  HEMATOLOGIC A:   Leukocytosis most likely reactive Anemia - mild  P:  F/u CBC  SCD's   INFECTIOUS A:   ?aspiration PNA - less likely P:   Cultures neg  Will check pct  D/c vanc  Can likely d/c zosyn in am   ENDOCRINE A:  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

## 2017-12-31 NOTE — Progress Notes (Signed)
Initial Nutrition Assessment  DOCUMENTATION CODES:   Obesity unspecified  INTERVENTION:   Tube Feeding:  Vital High Protein @ 20 ml/hr Pro-Stat 60 mL QID Provide 1644 kcals, 162 g of protein and 403 mL of free water   NUTRITION DIAGNOSIS:   Inadequate oral intake related to acute illness as evidenced by NPO status.  GOAL:   Provide needs based on ASPEN/SCCM guidelines  MONITOR:   TF tolerance, Vent status, Labs, Weight trends  REASON FOR ASSESSMENT:   Consult, Ventilator Enteral/tube feeding initiation and management  ASSESSMENT:    72 yo male admitted with acute respiratory failure with acute pulmonary edem and possible aspiration pneumonia; pt also with acute metabolic encephalopathy, bilateral subdural hematomas, seizures and thyroid storm. Pt with hx of CKD III, DM, adenocarcinoma of colon s/p resection and colostomy, renal cell carcinoma    Pt currently sedated on vent Diprivan: 13.8 ml/hr  Wife at bedside. Wife reports pt generally has a good appetite but has not eaten much since Sunday. Wife reports pt has experienced some weight loss but some of this appears to intentional. She also indicated that pt has been taking the weight loss supplement, lipozene. Reports UBW around 280-315 pounds; current wt of 266 pounds.   Labs: potassium 3.0 Meds: lasix   NUTRITION - FOCUSED PHYSICAL EXAM:    Most Recent Value  Orbital Region  Mild depletion  Upper Arm Region  No depletion  Thoracic and Lumbar Region  No depletion  Buccal Region  No depletion  Temple Region  Mild depletion  Clavicle Bone Region  No depletion  Clavicle and Acromion Bone Region  No depletion  Scapular Bone Region  No depletion  Dorsal Hand  No depletion  Patellar Region  No depletion  Anterior Thigh Region  No depletion  Posterior Calf Region  No depletion  Edema (RD Assessment)  Mild       Diet Order:  Diet NPO time specified  EDUCATION NEEDS:   Not appropriate for education at this  time  Skin:  Skin Assessment: Reviewed RN Assessment  Last BM:  4/3 colostomy  Height:   Ht Readings from Last 1 Encounters:  12/31/17 6\' 2"  (1.88 m)    Weight:   Wt Readings from Last 1 Encounters:  12/31/17 266 lb 5.1 oz (120.8 kg)    Ideal Body Weight:  86 kg  BMI:  Body mass index is 34.19 kg/m.  Estimated Nutritional Needs:   Kcal:  9983-3825 kcals  Protein:  162-202 g  Fluid:  >/= 2 L   Kerman Passey MS, RD, LDN, CNSC [phone] Pager  [phone] Weekend/On-Call Pager

## 2018-01-01 ENCOUNTER — Inpatient Hospital Stay (HOSPITAL_COMMUNITY): Payer: Medicare Other

## 2018-01-01 LAB — BASIC METABOLIC PANEL
ANION GAP: 9 (ref 5–15)
BUN: 62 mg/dL — ABNORMAL HIGH (ref 6–20)
CALCIUM: 9.3 mg/dL (ref 8.9–10.3)
CO2: 29 mmol/L (ref 22–32)
Chloride: 106 mmol/L (ref 101–111)
Creatinine, Ser: 1.55 mg/dL — ABNORMAL HIGH (ref 0.61–1.24)
GFR, EST AFRICAN AMERICAN: 51 mL/min — AB (ref >60)
GFR, EST NON AFRICAN AMERICAN: 44 mL/min — AB (ref >60)
Glucose, Bld: 233 mg/dL — ABNORMAL HIGH (ref 65–99)
POTASSIUM: 3.5 mmol/L (ref 3.5–5.1)
Sodium: 144 mmol/L (ref 135–145)

## 2018-01-01 LAB — GLUCOSE, CAPILLARY
GLUCOSE-CAPILLARY: 210 mg/dL — AB (ref 65–99)
GLUCOSE-CAPILLARY: 284 mg/dL — AB (ref 65–99)
GLUCOSE-CAPILLARY: 258 mg/dL — AB (ref 65–99)
GLUCOSE-CAPILLARY: 225 mg/dL — AB (ref 65–99)
GLUCOSE-CAPILLARY: 176 mg/dL — AB (ref 65–99)
Glucose-Capillary: 216 mg/dL — ABNORMAL HIGH (ref 65–99)

## 2018-01-01 LAB — CBC
HEMATOCRIT: 27.6 % — AB (ref 39.0–52.0)
Hemoglobin: 8.6 g/dL — ABNORMAL LOW (ref 13.0–17.0)
MCH: 27.6 pg (ref 26.0–34.0)
MCHC: 31.2 g/dL (ref 30.0–36.0)
MCV: 88.5 fL (ref 78.0–100.0)
PLATELETS: 238 10*3/uL (ref 150–400)
RBC: 3.12 MIL/uL — AB (ref 4.22–5.81)
RDW: 15.2 % (ref 11.5–15.5)
WBC: 9.8 10*3/uL (ref 4.0–10.5)

## 2018-01-01 LAB — PROCALCITONIN

## 2018-01-01 LAB — MAGNESIUM
MAGNESIUM: 2.3 mg/dL (ref 1.7–2.4)
Magnesium: 2.2 mg/dL (ref 1.7–2.4)

## 2018-01-01 LAB — PHOSPHORUS
PHOSPHORUS: 3.4 mg/dL (ref 2.5–4.6)
Phosphorus: 4.8 mg/dL — ABNORMAL HIGH (ref 2.5–4.6)

## 2018-01-01 MED ORDER — DILTIAZEM HCL 60 MG PO TABS
60.0000 mg | ORAL_TABLET | Freq: Three times a day (TID) | ORAL | Status: DC
  Administered 2018-01-01 – 2018-01-02 (×4): 60 mg via ORAL
  Filled 2018-01-01 (×4): qty 1

## 2018-01-01 MED ORDER — HYDROCORTISONE NA SUCCINATE PF 100 MG IJ SOLR: 50.0000 mg | Freq: Four times a day (QID) | INTRAMUSCULAR | Status: DC

## 2018-01-01 MED ORDER — RACEPINEPHRINE HCL 2.25 % IN NEBU
INHALATION_SOLUTION | RESPIRATORY_TRACT | Status: AC
  Filled 2018-01-01: qty 0.5

## 2018-01-01 MED ORDER — HYDROCORTISONE NA SUCCINATE PF 100 MG IJ SOLR
50.0000 mg | Freq: Four times a day (QID) | INTRAMUSCULAR | Status: AC
  Administered 2018-01-01 – 2018-01-02 (×4): 50 mg via INTRAVENOUS
  Filled 2018-01-01 (×4): qty 2

## 2018-01-01 MED ORDER — PROPRANOLOL HCL 1 MG/ML IV SOLN
1.0000 mg | INTRAVENOUS | Status: DC
  Filled 2018-01-01 (×3): qty 1

## 2018-01-01 MED ORDER — CHLORHEXIDINE GLUCONATE 0.12 % MT SOLN: 15.0000 mL | Freq: Two times a day (BID) | OROMUCOSAL | Status: DC

## 2018-01-01 MED ORDER — RACEPINEPHRINE HCL 2.25 % IN NEBU
0.5000 mL | INHALATION_SOLUTION | Freq: Once | RESPIRATORY_TRACT | Status: AC
  Administered 2018-01-01: 0.5 mL via RESPIRATORY_TRACT

## 2018-01-01 MED ORDER — DEXTROSE 5 % IV SOLN
5.0000 mg/h | INTRAVENOUS | Status: DC
  Filled 2018-01-01: qty 100

## 2018-01-01 MED ORDER — ORAL CARE MOUTH RINSE: 15.0000 mL | Freq: Two times a day (BID) | OROMUCOSAL | Status: DC

## 2018-01-01 MED ORDER — HYDROCORTISONE NA SUCCINATE PF 100 MG IJ SOLR
50.0000 mg | Freq: Two times a day (BID) | INTRAMUSCULAR | Status: DC
  Administered 2018-01-02 – 2018-01-05 (×6): 50 mg via INTRAVENOUS
  Filled 2018-01-01 (×6): qty 2

## 2018-01-01 MED ORDER — DEXMEDETOMIDINE HCL IN NACL 200 MCG/50ML IV SOLN
0.4000 ug/kg/h | INTRAVENOUS | Status: DC
  Administered 2018-01-01: 0.5 ug/kg/h via INTRAVENOUS
  Administered 2018-01-01: 0.4 ug/kg/h via INTRAVENOUS
  Administered 2018-01-01: 0.8 ug/kg/h via INTRAVENOUS
  Administered 2018-01-01: 0.7 ug/kg/h via INTRAVENOUS
  Administered 2018-01-01: 0.4 ug/kg/h via INTRAVENOUS
  Filled 2018-01-01 (×6): qty 50

## 2018-01-01 NOTE — Consult Note (Addendum)
[facility] ostomy consult note Stoma type/location:  Consult requested for colostomy supplies.  Pt had an ostomy prior to admission; there is no date of surgery in the EMR to indicate how long it has been in place. Stomal assessment/size: Stoma red and viable, 1 inch, flush with skin level. Peristomal assessment: intact skin surrounding Output: small amt semiformed brown stool Ostomy pouching: 1pc. pouch applied with barrier ring to assist with maintaining a seal.  Education provided:  Pt is critically ill and obtunded and no family members are at the bedside.  Supplies ordered to the bedside for staff nurse use. Please re-consult if further assistance is needed.  Thank-you,  Julien Girt MSN, [place], [place], [place], [place]

## 2018-01-01 NOTE — Progress Notes (Signed)
E link notified to change meds to IV, patient is lethargic and unable to swallow POs at this time.

## 2018-01-01 NOTE — Progress Notes (Signed)
PULMONARY / CRITICAL CARE MEDICINE   Name: [name] MRN: [id number] DOB: [birth year]    ADMISSION DATE:  12/03/2017 CONSULTATION DATE: 12/30/2017  REFERRING MD:  Karie Kirks  CHIEF COMPLAINT:  Shortness of breath   HISTORY OF PRESENT ILLNESS:   [name] is a 72 yr old male with history of a fib, stage 3 chronic kidney disease, DM2, adenocarcinoma of colon s/p resection and current colostomy, history of renal cell carcinoma s/o resection who was admitted on 12/05/2017 for shortnes s of breath, supratherputic INR,  afib with RVR and pulm edema. During his stay he was diagnosed with thyrotoxicosis and bilateral SDH and seizure. On 4/2 PCCM consulted for increased work of breathing, hypoxia, AMS.  He was intubated and tx to ICU.      Subjective: No acute change overnight.   Triglycerides high, propofol stopped, transitioned to precedex.   VITAL SIGNS: BP (!) 163/81   Pulse 81   Temp 99.9 F (37.7 C)   Resp 16   Ht 6\' 2"  (1.88 m)   Wt 119 kg (262 lb 5.6 oz)   SpO2 97%   BMI 33.68 kg/m   HEMODYNAMICS:    VENTILATOR SETTINGS: Vent Mode: PRVC FiO2 (%):  [30 %-40 %] 30 % Set Rate:  [16 bmp] 16 bmp Vt Set:  [610 mL-620 mL] 610 mL PEEP:  [5 cmH20] 5 cmH20 Plateau Pressure:  [12 cmH20-15 cmH20] 14 cmH20  INTAKE / OUTPUT: I/O last 3 completed shifts: In: 3538.8 [I.V.:1858.1; NG/GT:480.7; IV Piggyback:1200] Out: 8841 [Urine:2200; Stool:275]  PHYSICAL EXAMINATION: General: wdwn male, NAD on vent  Neuro:  Sedated, RASS -1, opens eyes, follows some simple commands briefly, per RN had some agitation this am.  HEENT: mm moist, ETT  Cardiovascular:  Irregularly irregular heart rate  Lungs:  resps even non labored on vent, rare scattered rhonchi otherwise clear   Abdomen: soft no tenderness  Musculoskeletal:  No edema  Skin:  No rash   LABS:  BMET Recent Labs  Lab 12/30/17 0506 12/31/17 0354 01/01/18 0453  NA 143 142 144  K 3.1* 3.0* 3.5  CL 104 103 106  CO2 27 28 29    BUN 29* 33* 62*  CREATININE 1.47* 1.34* 1.55*  GLUCOSE 246* 254* 233*    Electrolytes Recent Labs  Lab 12/30/17 0506  12/31/17 0354 12/31/17 0930 12/31/17 1615 01/01/18 0453  CALCIUM 9.5  --  9.2  --   --  9.3  MG 2.4  --  2.3 2.1 1.8 2.3  PHOS  --    < > 4.5 4.1 3.7 4.8*   < > = values in this interval not displayed.    CBC Recent Labs  Lab 12/30/17 0506 12/31/17 0354 01/01/18 0453  WBC 12.5* 9.2 9.8  HGB 9.0* 8.5* 8.6*  HCT 29.0* 27.6* 27.6*  PLT 322 244 238    Coag's Recent Labs  Lab 12/29/17 1838 12/30/17 0506 12/31/17 0354  INR 1.29 1.34 1.36    Sepsis Markers Recent Labs  Lab 12/22/2017 2310 12/27/17 0231 12/30/17 1942 12/31/17 0913 01/01/18 0453  LATICACIDVEN 2.3* 1.1 1.0  --   --   PROCALCITON  --   --   --  <0.10 <0.10    ABG Recent Labs  Lab 12/27/17 1249 12/29/17 1246 12/30/17 1459  PHART 7.496* 7.493* 7.509*  PCO2ART 31.5* 37.8 40.0  PO2ART 115* 107 176*    Liver Enzymes Recent Labs  Lab 12/23/2017 1615 12/31/17 0354  AST 14* 21  ALT 19 54  ALKPHOS  64 56  BILITOT 1.9* 3.0*  ALBUMIN 3.4* 2.4*    Cardiac Enzymes Recent Labs  Lab 12/08/2017 2100 12/27/17 0231 12/27/17 0944  TROPONINI 0.07* 0.17* 0.15*    Glucose Recent Labs  Lab 12/31/17 1208 12/31/17 1609 12/31/17 1921 12/31/17 2334 01/01/18 0328 01/01/18 0733  GLUCAP 219* 195* 215* 222* 210* 216*    Imaging Dg Hand Complete Right  Result Date: 12/31/2017 CLINICAL DATA:  Swelling. EXAM: RIGHT HAND - COMPLETE 3+ VIEW COMPARISON:  None. FINDINGS: There is no evidence of fracture or dislocation. There is no evidence of arthropathy or other focal bone abnormality. Mild generalized soft tissue swelling. Soft tissues are otherwise unremarkable. IMPRESSION: 1. Mild generalized soft tissue swelling. No acute osseous abnormality or significant degenerative changes. Electronically Signed   By: Titus Dubin M.D.   On: 12/31/2017 16:49   US Thyroid  Result Date:  01/01/2018 CLINICAL DATA:  Thyroid storm, hyperthyroidism EXAM: THYROID ULTRASOUND TECHNIQUE: Ultrasound examination of the thyroid gland and adjacent soft tissues was performed. COMPARISON:  None. FINDINGS: Parenchymal Echotexture: Moderately heterogenous Isthmus: 4 mm Right lobe: 5.0 x 2.3 x 2.4 cm Left lobe: 4.4 x 2.0 x 1.7 cm _________________________________________________________ Estimated total number of nodules >/= 1 cm: 2 Number of spongiform nodules >/=  2 cm not described below (TR1): 0 Number of mixed cystic and solid nodules >/= 1.5 cm not described below (TR2): 0 _________________________________________________________ Nodule # 1: Location: Right; Mid Maximum size: 1.5 cm; Other 2 dimensions: 1.1 x 1.2 cm Composition: solid/almost completely solid (2) Echogenicity: hypoechoic (2) Shape: not taller-than-wide (0) Margins: ill-defined (0) Echogenic foci: peripheral calcifications (2) ACR TI-RADS total points: 6. ACR TI-RADS risk category: TR4 (4-6 points). ACR TI-RADS recommendations: **Given size (>/= 1.5 cm) and appearance, fine needle aspiration of this moderately suspicious nodule should be considered based on TI-RADS criteria. _________________________________________________________ Nodule # 2: Location: Left; Inferior Maximum size: 1.4 cm; Other 2 dimensions: 1.3 x 1.4 cm Composition: solid/almost completely solid (2) Echogenicity: isoechoic (1) Shape: not taller-than-wide (0) Margins: ill-defined (0) Echogenic foci: none (0) ACR TI-RADS total points: 3. ACR TI-RADS risk category: TR3 (3 points). ACR TI-RADS recommendations: Given size (<1.4 cm) and appearance, this nodule does NOT meet TI-RADS criteria for biopsy or dedicated follow-up. _________________________________________________________ Nonspecific moderate gland heterogeneity. No hypervascularity. No adenopathy. IMPRESSION: 1.5 cm right mid thyroid TR 4 nodule meets criteria for biopsy as above. 1.4 cm left inferior TR 3 nodule does not  meet criteria for any biopsy or follow-up. Nonspecific gland heterogeneity. The above is in keeping with the ACR TI-RADS recommendations - J Am Coll Radiol 2017;14:587-595. Electronically Signed   By: Jerilynn Mages.  Shick M.D.   On: 01/01/2018 08:19     STUDIES:  CT head 12/28/2017 >>1. Bilateral subdural hematomas, right larger than left. Focal collection of blood in the right frontoparietal region with hematocrit level, ongoing active bleeding not excluded. Short interval follow-up recommended. 2. Minimal mass effect without significant midline shift. MRI brian 4/1>>> 1. Stable right greater than left subdural hematomas since the head CT yesterday.  The right side subdural demonstrates greater lobulation and signal heterogeneity measuring between 8 and 17 mm in thickness. The smaller more homogeneous left side subdural measures 6-7 mm in thickness. 2. Stable mild intracranial mass effect with trace leftward midline shift. Normal basilar cisterns. Trace intraventricular hemorrhage with no ventriculomegaly. 3. No superimposed acute infarct. CT head 4/2>>> 1. Stable right larger than left mixed attenuation subdural hematomas. Stable minimal associated mass effect. No herniation. 2. No new acute  intracranial abnormality identified. US thyroid 4/4>>> 1.5 cm right mid thyroid TR 4 nodule meets criteria for biopsy as Above.  1.4 cm left inferior TR 3 nodule does not meet criteria for any biopsy or follow-up.   Nonspecific gland heterogeneity.  CULTURES: BC x 2 3/29>>> 1/2 coag neg staph>> suspect contaminant  BC x 2 4/1>>> neg   LINES/TUBES:  ETT 4/2>>> RUA PICC 3/30>>>  ANTIBIOTICS: Zosyn 3/30>>>plan 7 days (stop date 4/5) Vanc 3/31>>>4/3  SIGNIFICANT EVENTS: 12/30/2017 transfer to ICU for worsening resp failure and mental status with afib RVR    DISCUSSION: 72 year old with atrial fibrillation, thyrotoxicosis, subdural hemorrhage, seizures Transferred to the ICU yesterday for acute  respiratory failure requiring intubation.  ASSESSMENT / PLAN:  PULMONARY A: Acute hypoxemic resp failure Acute pulm edema ?aspiration PNA - less likely. Pct neg.  P:   Vent support - 8cc/kg  Daily SBT -- wean sedation as below  F/u CXR now F/u ABG Empiric abx as above - plan 7 days    CARDIOVASCULAR A:  HTN emergency Acute dias/tolic CHF pulm edema  Atrial fibrillation with RVR  2D echo 3//30>> EF 600-65%, mod hypertrophy, trivial Dwayne, trivial TR, severely dilated RA and LA P:  Continue cardizem drip Will hold further diuresis today - CXR pending. Does not appear overtly volume overloaded  Propranolol as below - ok to change to PO  Cards following   RENAL A:   AKI Hypokalemia  P:   Replete K PRN   f/u chem   GASTROINTESTINAL No active issue   P:   PPI  Continue TF   HEMATOLOGIC A:   Leukocytosis most likely reactive Anemia - mild  P:  F/u CBC  SCD's   INFECTIOUS A:   ?aspiration PNA - less likely. Pct neg. But did apparently have obvious aspiration of barium. 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, TSH 0.0001 Thyroid nodules  Hyperglycemia  P:   Continue methimazole, propranolol Now that HR/acute hemodynamics resolved can d/c iodine and wean stress steroids  F/u TSH pending  Will need bx of at least 1 thyroid nodule at some point  Weaning steroids  SSI   NEUROLOGIC A:   Acute metabolic encephalopathy Bilateral SDH - stable  seizure  P:   RASS goal -1  Propofol stopped overnight r/t hypertriglyceridemia  Wean fentanyl gtt as able  Continue precedex - wean as able  Currently oversedated - wean sedation and retry SBT  Continue keppra  Hold all anticoagulation  Correct thyroid storm as above Neuro following    FAMILY  - Updates: no family available 4/4 - updated by RN via phone this am   - Inter-disciplinary family meet or Palliative Care meeting due by:  12/30/2017     Nickolas Madrid, NP 01/01/2018  9:18 AM Pager: [phone] or [phone]

## 2018-01-01 NOTE — Progress Notes (Signed)
Inpatient Diabetes Program Recommendations  AACE/ADA: New Consensus Statement on Inpatient Glycemic Control (2015)  Target Ranges:  Prepandial:   less than 140 mg/dL      Peak postprandial:   less than 180 mg/dL (1-2 hours)      Critically ill patients:  140 - 180 mg/dL   Results for MINAS, BONSER (MRN [id number]) as of 01/01/2018 08:18  Ref. Range 12/31/2017 07:43 12/31/2017 12:08 12/31/2017 16:09 12/31/2017 19:21 12/31/2017 23:34 01/01/2018 03:28 01/01/2018 07:33  Glucose-Capillary Latest Ref Range: 65 - 99 mg/dL 182 (H)  Novolog 2 units 219 (H)  Novolog 3 units 195 (H)  Novolog 2 units 215 (H)  Novolog 3 units 222 (H)  Novolog 3 units 210 (H)  Novolog 3 units 216 (H)  Novolog 3 units   Review of Glycemic Control  Diabetes history: DM2 Outpatient Diabetes medications: Metformin 1000 mg BID, Actos 45 mg daily Current orders for Inpatient glycemic control: Novolog 0-9 units Q4H; Solucortef 100 mg Q8H  Inpatient Diabetes Program Recommendations:  Insulin - Basal: Over the past 24 hours, glucose has ranged from 182-222 mg/dl and patient has received a total of Novolog 19 units for correction. If steroids are continued, please consider ordering Lantus 10 units Q24H.  Thanks, Barnie Alderman, RN, MSN, CDE Diabetes Coordinator Inpatient Diabetes Program [phone] (Team Pager from 8am to 5pm)

## 2018-01-01 NOTE — Progress Notes (Signed)
[facility] Progress Note Patient Name: ARGUS CARAHER DOB: 1946-03-16 MRN: [id number]   Date of Service  01/01/2018  HPI/Events of Note  Patient now alert and oriented and able to swallow.  eICU Interventions  D/c iv propranolol and Cardizem infusions, resume oral Cardizem/ Propranolol once patient passes bedside swallow eval.         Merranda Bolls U Sadee Osland 01/01/2018, 11:22 PM

## 2018-01-01 NOTE — Progress Notes (Addendum)
Subjective: Patient is intubated but breathing over the vent.  Propofol has been turned off since this morning and fentanyl was turned off 30 minutes ago.  Patient is awake and opens eyes to voice along with following commands such as trying to lift his arms squeezing my hand, wiggling his toes, following my fingers.  Exam: Vitals:   01/01/18 1000 01/01/18 1100  BP: (!) 154/92 (!) 166/89  Pulse: 70 79  Resp: 16 16  Temp: 100 F (37.8 C) 99.9 F (37.7 C)  SpO2: 97% 97%    Physical Exam  Gen: In bed, intubated Resp: ventilated Abd: soft, nt  Neuro:  MS: awakens to voice, follows commands.  CN: opening equally, follows my fingers without problems PERRL Motor: moves all extremities relatively equally, currently in four-point restraints.  Sensory:responds to stim x 4.  Deep tendon reflexes are 2+ throughout except for Achilles which has no reflexes, toes are mute     Medications:  Scheduled: . chlorhexidine gluconate (MEDLINE KIT)  15 mL Mouth Rinse BID  . Chlorhexidine Gluconate Cloth  6 each Topical Daily  . diltiazem  60 mg Oral Q6H  . feeding supplement (PRO-STAT SUGAR FREE 64)  60 mL Per Tube QID  . feeding supplement (VITAL HIGH PROTEIN)  1,000 mL Per Tube Q24H  . hydrocortisone sod succinate (SOLU-CORTEF) inj  50 mg Intravenous Q6H  . [START ON 01/02/2018] hydrocortisone sod succinate (SOLU-CORTEF) inj  50 mg Intravenous Q12H  . insulin aspart  0-9 Units Subcutaneous Q4H  . levalbuterol  0.63 mg Nebulization TID   And  . ipratropium  0.5 mg Nebulization TID  . mouth rinse  15 mL Mouth Rinse 10 times per day  . methimazole  20 mg Per Tube Q4H  . pantoprazole sodium  40 mg Per Tube Daily  . propranolol  40 mg Per Tube Q4H  . sodium chloride flush  10-40 mL Intracatheter Q12H   Continuous: . sodium chloride 10 mL/hr at 01/01/18 1100  . dexmedetomidine (PRECEDEX) IV infusion 0.5 mcg/kg/hr (01/01/18 1135)  . fentaNYL infusion INTRAVENOUS Stopped (01/01/18 1027)  .  levETIRAcetam Stopped (01/01/18 0502)  . piperacillin-tazobactam (ZOSYN)  IV Stopped (01/01/18 0954)   PRN:[DISCONTINUED] acetaminophen **OR** acetaminophen, haloperidol lactate, hydrALAZINE, levalbuterol, LORazepam, ondansetron **OR** ondansetron (ZOFRAN) IV, sodium chloride flush  Pertinent Labs/Diagnostics: Glucose 233 BUN 62 Creatinine 1.55 Phosphorus 4.8 Triglycerides 706      Etta Quill PA-C [facility] [phone]  Impression: 72 yo M with new onset seizures in the setting of metabolic abnormalities(particularly thyroid storm) as well as subdural hematoma. He has been mildly encephalopathic for the past few days which I think is more related to his overall medical condition than the SDH. With seizures in the setting of SDH, however, I would continue antiepileptic therapy, at least until the SDH resolves, though he may not need lifelong therapy.   Recommendations: 1) Continue keppra(max dose renally), I would likely  Continue this for several months.   2) Continue to optimize his medical conditions.  3) continue to hold anticoagulation(SDH stable)  4) consider discussing with neurosurgery need for intervention/length of time to hold his anticoagulation once his medical course has been more stabilized. 5) no further recommendations at this time.  Please call with any further questions or concerns.    Roland Rack, MD [facility] [phone]  If 7pm- 7am, please page neurology on call as listed in [place].   01/01/2018, 11:33 AM

## 2018-01-01 NOTE — Procedures (Signed)
Extubation Procedure Note  Patient Details:   Name: SEABORN NAKAMA DOB: 1946/02/18 MRN: [id number]   Airway Documentation:     Evaluation  O2 sats: stable throughout Complications: No apparent complications Patient did tolerate procedure well. Bilateral Breath Sounds: Diminished   Patient extubated to 3 L [place] without incident. Patient able to speak but remains confused about time and place. No respiratory distress noted. Vent remains at bedside.   Mali M Jayona Mccaig 01/01/2018, 3:50 PM

## 2018-01-01 NOTE — Progress Notes (Signed)
Attempts made x2 to wean this am on PS/CPAP. Both attempts stopped within first minute due to low RR and VE. Sedation being turned down. Will attempt again as soon as patient becomes more alert.

## 2018-01-01 NOTE — Progress Notes (Signed)
Patient presents with mild stridor most noticeable when speaking. Single dose of Racemic Epi given via HHN. 2 units charted against on the [facility] reflect issue with cabinet of pyxsis not opening requiring 2 overrides to pull medication.

## 2018-01-01 NOTE — Plan of Care (Signed)
  Problem: Education: Goal: Knowledge of General Education information will improve Outcome: Not Progressing Note:  Intubated/sedated   Problem: Health Behavior/Discharge Planning: Goal: Ability to manage health-related needs will improve Outcome: Not Progressing Note:  Intubated/sedated   Problem: Education: Goal: Knowledge of disease or condition will improve Outcome: Not Progressing Note:  Sedated    Problem: Clinical Measurements: Goal: Ability to maintain clinical measurements within normal limits will improve Outcome: Progressing Goal: Will remain free from infection Outcome: Progressing Goal: Diagnostic test results will improve Outcome: Progressing Goal: Respiratory complications will improve Outcome: Progressing Note:  On ventilator Goal: Cardiovascular complication will be avoided Outcome: Progressing   Problem: Activity: Goal: Risk for activity intolerance will decrease Outcome: Progressing   Problem: Nutrition: Goal: Adequate nutrition will be maintained Outcome: Progressing Note:  Tube feedings started 4/3    Problem: Coping: Goal: Level of anxiety will decrease Outcome: Progressing Note:  Sedated    Problem: Elimination: Goal: Will not experience complications related to bowel motility Outcome: Progressing Note:  Colostomy Goal: Will not experience complications related to urinary retention Outcome: Progressing Note:  Foley   Problem: Pain Managment: Goal: General experience of comfort will improve Outcome: Progressing Note:  Sedated    Problem: Safety: Goal: Ability to remain free from injury will improve Outcome: Progressing   Problem: Skin Integrity: Goal: Risk for impaired skin integrity will decrease Outcome: Progressing Note:  Rotation bed, q2 hr turns, heels floated   Problem: Activity: Goal: Ability to tolerate increased activity will improve Outcome: Progressing   Problem: Cardiac: Goal: Ability to achieve and  maintain adequate cardiopulmonary perfusion will improve Outcome: Progressing   Problem: Fluid Volume: Goal: Hemodynamic stability will improve Outcome: Progressing   Problem: Clinical Measurements: Goal: Diagnostic test results will improve Outcome: Progressing Goal: Signs and symptoms of infection will decrease Outcome: Progressing   Problem: Respiratory: Goal: Ability to maintain adequate ventilation will improve Outcome: Progressing   Problem: Activity: Goal: Ability to tolerate increased activity will improve Outcome: Progressing   Problem: Respiratory: Goal: Ability to maintain a clear airway and adequate ventilation will improve Outcome: Progressing

## 2018-01-01 NOTE — Progress Notes (Signed)
[facility] Progress Note Patient Name: [name] DOB: May 25, 1946 MRN: [id number]   Date of Service  01/01/2018  HPI/Events of Note  Hr high 70's and patient hemodynamically stable.  eICU Interventions  Begin Precedex infusion and titrate off Propofol due to hypertriglyceridemia.        [name] 01/01/2018, 5:32 AM

## 2018-01-02 ENCOUNTER — Inpatient Hospital Stay (HOSPITAL_COMMUNITY): Payer: Medicare Other

## 2018-01-02 LAB — GLUCOSE, CAPILLARY
GLUCOSE-CAPILLARY: 191 mg/dL — AB (ref 65–99)
GLUCOSE-CAPILLARY: 259 mg/dL — AB (ref 65–99)
Glucose-Capillary: 195 mg/dL — ABNORMAL HIGH (ref 65–99)
Glucose-Capillary: 210 mg/dL — ABNORMAL HIGH (ref 65–99)
Glucose-Capillary: 261 mg/dL — ABNORMAL HIGH (ref 65–99)
Glucose-Capillary: 241 mg/dL — ABNORMAL HIGH (ref 65–99)

## 2018-01-02 LAB — BASIC METABOLIC PANEL
Anion gap: 11 (ref 5–15)
BUN: 61 mg/dL — AB (ref 6–20)
CO2: 29 mmol/L (ref 22–32)
Calcium: 10.2 mg/dL (ref 8.9–10.3)
Chloride: 111 mmol/L (ref 101–111)
Creatinine, Ser: 1.44 mg/dL — ABNORMAL HIGH (ref 0.61–1.24)
GFR calc Af Amer: 56 mL/min — ABNORMAL LOW (ref >60)
GFR calc non Af Amer: 48 mL/min — ABNORMAL LOW (ref >60)
Glucose, Bld: 217 mg/dL — ABNORMAL HIGH (ref 65–99)
POTASSIUM: 2.9 mmol/L — AB (ref 3.5–5.1)
SODIUM: 151 mmol/L — AB (ref 135–145)

## 2018-01-02 LAB — PROCALCITONIN: Procalcitonin: <0.1 ng/mL

## 2018-01-02 LAB — TSH: TSH: <0.01 u[IU]/mL — ABNORMAL LOW (ref 0.350–4.500)

## 2018-01-02 LAB — T4, FREE: Free T4: 2.14 ng/dL — ABNORMAL HIGH (ref 0.61–1.12)

## 2018-01-02 MED ORDER — LEVALBUTEROL HCL 0.63 MG/3ML IN NEBU: 0.6300 mg | INHALATION_SOLUTION | Freq: Two times a day (BID) | RESPIRATORY_TRACT | Status: DC

## 2018-01-02 MED ORDER — IPRATROPIUM BROMIDE 0.02 % IN SOLN: 0.5000 mg | Freq: Three times a day (TID) | RESPIRATORY_TRACT | Status: DC

## 2018-01-02 MED ORDER — POTASSIUM CHLORIDE 20 MEQ/15ML (10%) PO SOLN
40.0000 meq | Freq: Two times a day (BID) | ORAL | Status: DC
  Administered 2018-01-02 (×2): 40 meq via ORAL
  Filled 2018-01-02 (×2): qty 30

## 2018-01-02 MED ORDER — ORAL CARE MOUTH RINSE
15.0000 mL | Freq: Two times a day (BID) | OROMUCOSAL | Status: DC
  Administered 2018-01-02 – 2018-01-07 (×12): 15 mL via OROMUCOSAL

## 2018-01-02 MED ORDER — POTASSIUM CHLORIDE 20 MEQ/15ML (10%) PO SOLN: 20.0000 meq | Freq: Once | ORAL | Status: DC

## 2018-01-02 MED ORDER — LEVALBUTEROL HCL 0.63 MG/3ML IN NEBU
0.6300 mg | INHALATION_SOLUTION | Freq: Two times a day (BID) | RESPIRATORY_TRACT | Status: DC
  Administered 2018-01-02 – 2018-01-06 (×8): 0.63 mg via RESPIRATORY_TRACT
  Filled 2018-01-02 (×8): qty 3

## 2018-01-02 MED ORDER — POTASSIUM CHLORIDE 10 MEQ/50ML IV SOLN
10.0000 meq | INTRAVENOUS | Status: AC
  Administered 2018-01-02 (×2): 10 meq via INTRAVENOUS
  Filled 2018-01-02 (×2): qty 50

## 2018-01-02 MED ORDER — VITAL 1.5 CAL PO LIQD
1000.0000 mL | ORAL | Status: DC
  Administered 2018-01-02 – 2018-01-06 (×5): 1000 mL
  Filled 2018-01-02 (×11): qty 1000

## 2018-01-02 MED ORDER — PRO-STAT SUGAR FREE PO LIQD
30.0000 mL | Freq: Two times a day (BID) | ORAL | Status: DC
  Administered 2018-01-02 – 2018-01-07 (×9): 30 mL
  Filled 2018-01-02 (×9): qty 30

## 2018-01-02 MED ORDER — POTASSIUM CHLORIDE 10 MEQ/100ML IV SOLN: 10.0000 meq | INTRAVENOUS | Status: DC

## 2018-01-02 MED ORDER — IPRATROPIUM BROMIDE 0.02 % IN SOLN
0.5000 mg | Freq: Two times a day (BID) | RESPIRATORY_TRACT | Status: DC
  Administered 2018-01-02 – 2018-01-06 (×8): 0.5 mg via RESPIRATORY_TRACT
  Filled 2018-01-02 (×8): qty 2.5

## 2018-01-02 MED ORDER — METHIMAZOLE 10 MG PO TABS
20.0000 mg | ORAL_TABLET | Freq: Four times a day (QID) | ORAL | Status: DC
Start: 2018-01-02 — End: 2018-01-22
  Administered 2018-01-02 – 2018-01-22 (×76): 20 mg
  Filled 2018-01-02 (×84): qty 2

## 2018-01-02 NOTE — Progress Notes (Signed)
Patient did not tolerate swallowing medication this morning. Patient started coughing immediately after swallowing was attempted. RN then saw the note that previous barium swallow failed. Lung sounds clear/diminished, will continue to monitor.

## 2018-01-02 NOTE — Care Management Note (Addendum)
Case Management Note  Patient Details  Name: [name] MRN: [id number] Date of Birth: 24-Apr-1946  Subjective/Objective:    History of HTN, HLD, thoracic aortic aneurysm, DM, CKD stage III, colon cancer-s/p resection and colostomy, and renal cancer; Admitted for Atrial Fibrillation.              Action/Plan: Cardiology consulted.  Prior to admission patient lived at home with wife. Plan is to return to the same living situation after discharge. At discharge, patient has transportation home.  PCP noted.  Expected Discharge Date:    To be determined.              Expected Discharge Plan:  Home/Self Care  In-House Referral:  Nutrition, Speech  Discharge planning Services  CM Consult  Status of Service:  In process, will continue to follow  Kristen Cardinal, RN  Nurse case [place] 01/02/2018, 3:01 PM

## 2018-01-02 NOTE — Progress Notes (Signed)
SLP Cancellation Note  Patient Details Name: BARNIE SOPKO MRN: [id number] DOB: [birth year]   Cancelled treatment:       Reason Eval/Treat Not Completed: Nursing reported that the patient was intubated 12/30/2017 the day of his MBS with removal of barium during the intubation.  Recommendation from that study was NPO due to silent aspiration of thin and nectar thick liquids and significant pharyngeal residue with pureed material.  He was extubated 01/01/2018 at 1549 and was started on medications crushed in purees.  Patient is currently very altered and not following commands per nursing.  Plan is to place a Cortrak for nutrition/medications.  ST will follow up for readiness for swallowing therapy.     Shelly Flatten, MA, [facility] SLP [phone] Lamar Sprinkles 01/02/2018, 11:31 AM

## 2018-01-02 NOTE — Progress Notes (Signed)
Cortrak Tube Team Note:  Consult received to place a Cortrak feeding tube.   A 10 F Cortrak tube was placed in the L nare and secured with a nasal bridle at 97 cm. Per the Cortrak monitor reading the tube tip is postpyloric, approximately at D2/D3   No x-ray is required. RN may begin using tube.   If the tube becomes dislodged please keep the tube and contact the Cortrak team at [email or web address] ([email or web address]) for replacement.  If after hours and replacement cannot be delayed, place a NG tube and confirm placement with an abdominal x-ray.   Burtis Junes RD, LDN, CNSC Clinical Nutrition Available Tues-Sat via Pager: [phone] 01/02/2018 12:24 PM

## 2018-01-02 NOTE — Progress Notes (Signed)
Refilled pt ice pack bags and placed axillary and suprapubic.

## 2018-01-02 NOTE — Progress Notes (Signed)
RN witnessed wife allowing patient to drink from her water bottle, advised about NPO status. Patient appears to tolerate swallowing well with no coughing. MD notified about change in patient status to be alert and oriented x3 and able to swallow.

## 2018-01-02 NOTE — Progress Notes (Signed)
Pt transferred to [facility], report given by day RN, updates charted, pt stable, NT at [facility], due meds given

## 2018-01-02 NOTE — Progress Notes (Signed)
PULMONARY / CRITICAL CARE MEDICINE   Name: [name] MRN: [id number] DOB: 05/29/1946    ADMISSION DATE:  12/09/2017 CONSULTATION DATE: 12/30/2017  REFERRING MD:  Karie Kirks  CHIEF COMPLAINT:  Shortness of breath   HISTORY OF PRESENT ILLNESS:   [name] is a 72 yr old male with history of a fib, stage 3 chronic kidney disease, DM2, adenocarcinoma of colon s/p resection and current colostomy, history of renal cell carcinoma s/o resection who was admitted on 12/11/2017 for shortnes s of breath, supratherputic INR,  afib with RVR and pulm edema. During his stay he was diagnosed with thyrotoxicosis and bilateral SDH and seizure. On 4/2 PCCM consulted for increased work of breathing, hypoxia, AMS.  He was intubated and tx to ICU.      Subjective: Awake and calm, following commands and cooperative Confused VITAL SIGNS: BP (!) 168/72   Pulse 96   Temp (!) 100.4 F (38 C)   Resp 15   Ht 6\' 2"  (1.88 m)   Wt 255 lb 8.2 oz (115.9 kg)   SpO2 96%   BMI 32.81 kg/m   HEMODYNAMICS:    VENTILATOR SETTINGS: Vent Mode: PSV;CPAP FiO2 (%):  [30 %] 30 % PEEP:  [5 cmH20] 5 cmH20  INTAKE / OUTPUT: I/O last 3 completed shifts: In: 2246.3 [I.V.:1091.3; Other:125; NG/GT:480; IV Piggyback:550] Out: 2703 [Urine:3060; Stool:525]  PHYSICAL EXAMINATION: General: Awake and alert male on 2 L [place], Following commands Neuro: Easily aroused, MAE x 4, Alert to person and place HEENT: NCAT, MM pink and moist, poor dentation Cardiovascular:S1, S2, irr, No RMG Lungs:  resps even non labored on vent, rare scattered rhonchi otherwise clear , diminished per bases  Abdomen: soft no tenderness, Colostomy with pink stoma  Musculoskeletal:  No edema , No obvious deformities Skin:  No rash , warm dry and intact  LABS:  BMET Recent Labs  Lab 12/31/17 0354 01/01/18 0453 01/02/18 0335  NA 142 144 151*  K 3.0* 3.5 2.9*  CL 103 106 111  CO2 28 29 29   BUN 33* 62* 61*  CREATININE 1.34* 1.55* 1.44*  GLUCOSE  254* 233* 217*    Electrolytes Recent Labs  Lab 12/31/17 0354  12/31/17 1615 01/01/18 0453 01/01/18 1819 01/02/18 0335  CALCIUM 9.2  --   --  9.3  --  10.2  MG 2.3   < > 1.8 2.3 2.2  --   PHOS 4.5   < > 3.7 4.8* 3.4  --    < > = values in this interval not displayed.    CBC Recent Labs  Lab 12/30/17 0506 12/31/17 0354 01/01/18 0453  WBC 12.5* 9.2 9.8  HGB 9.0* 8.5* 8.6*  HCT 29.0* 27.6* 27.6*  PLT 322 244 238    Coag's Recent Labs  Lab 12/29/17 1838 12/30/17 0506 12/31/17 0354  INR 1.29 1.34 1.36    Sepsis Markers Recent Labs  Lab 12/17/2017 2310 12/27/17 0231 12/30/17 1942 12/31/17 0913 01/01/18 0453 01/02/18 0335  LATICACIDVEN 2.3* 1.1 1.0  --   --   --   PROCALCITON  --   --   --  <0.10 <0.10 <0.10    ABG Recent Labs  Lab 12/27/17 1249 12/29/17 1246 12/30/17 1459  PHART 7.496* 7.493* 7.509*  PCO2ART 31.5* 37.8 40.0  PO2ART 115* 107 176*    Liver Enzymes Recent Labs  Lab 12/24/2017 1615 12/31/17 0354  AST 14* 21  ALT 19 54  ALKPHOS 64 56  BILITOT 1.9* 3.0*  ALBUMIN 3.4* 2.4*  Cardiac Enzymes Recent Labs  Lab 12/21/2017 2100 12/27/17 0231 12/27/17 0944  TROPONINI 0.07* 0.17* 0.15*    Glucose Recent Labs  Lab 01/01/18 1149 01/01/18 1510 01/01/18 1925 01/01/18 2318 01/02/18 0320 01/02/18 0729  GLUCAP 284* 258* 225* 176* 191* 195*    Imaging Dg Chest Portable 1 View  Result Date: 01/02/2018 CLINICAL DATA:  Respiratory failure EXAM: PORTABLE CHEST 1 VIEW COMPARISON:  January 01, 2018 FINDINGS: Endotracheal tube and nasogastric tube have been removed. Central catheter tip is in the superior vena cava. No pneumothorax. There is mild left base atelectasis. The lungs elsewhere are clear. Heart is mildly enlarged with pulmonary vascularity within normal limits. There is aortic atherosclerosis. No adenopathy. No bone lesions. IMPRESSION: Central catheter as described without pneumothorax. Left base atelectasis. No edema or  consolidation. Stable cardiac enlargement. There is aortic atherosclerosis. Aortic Atherosclerosis (ICD10-I70.0). Electronically Signed   By: Lowella Grip III M.D.   On: 01/02/2018 07:50     STUDIES:  CT head 12/28/2017 >>1. Bilateral subdural hematomas, right larger than left. Focal collection of blood in the right frontoparietal region with hematocrit level, ongoing active bleeding not excluded. Short interval follow-up recommended. 2. Minimal mass effect without significant midline shift. MRI brian 4/1>>> 1. Stable right greater than left subdural hematomas since the head CT yesterday.  The right side subdural demonstrates greater lobulation and signal heterogeneity measuring between 8 and 17 mm in thickness. The smaller more homogeneous left side subdural measures 6-7 mm in thickness. 2. Stable mild intracranial mass effect with trace leftward midline shift. Normal basilar cisterns. Trace intraventricular hemorrhage with no ventriculomegaly. 3. No superimposed acute infarct. CT head 4/2>>> 1. Stable right larger than left mixed attenuation subdural hematomas. Stable minimal associated mass effect. No herniation. 2. No new acute intracranial abnormality identified. US thyroid 4/4>>> 1.5 cm right mid thyroid TR 4 nodule meets criteria for biopsy as Above.  1.4 cm left inferior TR 3 nodule does not meet criteria for any biopsy or follow-up.   Nonspecific gland heterogeneity.  CULTURES: BC x 2 3/29>>> 1/2 coag neg staph>> suspect contaminant  BC x 2 4/1>>> neg   LINES/TUBES:  ETT 4/2>>> RUA PICC 3/30>>>  ANTIBIOTICS: Zosyn 3/30>>>plan 7 days (stop date 4/5) Vanc 3/31>>>4/3  SIGNIFICANT EVENTS: 12/30/2017 transfer to ICU for worsening resp failure and mental status with afib RVR  01/01/2018>> Extubated   DISCUSSION: 72 year old with atrial fibrillation, thyrotoxicosis, subdural hemorrhage, seizures Transferred to the ICU yesterday for acute respiratory failure requiring  intubation.  ASSESSMENT / PLAN:  PULMONARY A: Acute hypoxemic resp failure Acute pulm edema ?aspiration PNA - less likely. Pct neg.  P:   Extubated on 2 L  CXR prn ABG prn  IS  Mobilize Aggressive pulmonary toilet Empiric abx as above - plan 7 days    CARDIOVASCULAR A:  HTN emergency Acute dias/tolic CHF pulm edema  Atrial fibrillation with RVR  2D echo 3//30>> EF 600-65%, mod hypertrophy, trivial Dwayne, trivial TR, severely dilated RA and LA P:  Cardizem to po Propranolol as below - ok to change to PO  Cards following   RENAL A:   AKI Hypokalemia  P:   Replete electrolytes prn ( K repletion 4/5) Trend  chem  Monitor UP  GASTROINTESTINAL Failed swallow ? Aspiration  P:   PPI  Needs Cor Track feeding tube Continue TF when feeding tube placed    HEMATOLOGIC A:   Leukocytosis most likely reactive>> improving Anemia - mild  P:  F/u CBC  SCD's  Monitor for active bleeding  INFECTIOUS A:   ?aspiration PNA - less likely. Pct neg. But did apparently have obvious aspiration of barium. 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, TSH 0.0001 Thyroid nodules  Hyperglycemia  P:   Continue methimazole, but will decrease to every 6 hours to get to maintenance dose, propranolol Now that HR/acute hemodynamics resolved can d/c iodine and wean stress steroids  F/u TSH pending  Will need bx of at least 1 thyroid nodule at some point  Weaning steroids  SSI   NEUROLOGIC A:   Acute metabolic encephalopathy Bilateral SDH - stable  seizure  P:   RASS goal -1  Minimize sedation Continue keppra  Hold all anticoagulation  Correct thyroid storm as above Neuro following    FAMILY  - Updates: no family available 59/5    - Inter-disciplinary family meet or Palliative Care meeting due by:  12/30/2017    Magdalen Spatz, AGACNP-BC 01/02/2018  10:45 AM Pager: [phone]

## 2018-01-02 NOTE — Progress Notes (Signed)
Progress Note  Patient Name: [name] Date of Encounter: 01/02/2018  Primary Cardiologist: Rozann Lesches, MD   Subjective   Extubated; denies dyspnea or chest pain  Inpatient Medications    Scheduled Meds: . Chlorhexidine Gluconate Cloth  6 each Topical Daily  . diltiazem  60 mg Oral Q8H  . feeding supplement (PRO-STAT SUGAR FREE 64)  60 mL Per Tube QID  . feeding supplement (VITAL HIGH PROTEIN)  1,000 mL Per Tube Q24H  . hydrocortisone sod succinate (SOLU-CORTEF) inj  50 mg Intravenous Q12H  . insulin aspart  0-9 Units Subcutaneous Q4H  . ipratropium  0.5 mg Nebulization BID   And  . levalbuterol  0.63 mg Nebulization BID  . mouth rinse  15 mL Mouth Rinse BID  . methimazole  20 mg Per Tube Q4H  . pantoprazole sodium  40 mg Per Tube Daily  . propranolol  40 mg Per Tube Q4H  . sodium chloride flush  10-40 mL Intracatheter Q12H   Continuous Infusions: . sodium chloride 10 mL/hr at 01/02/18 0600  . dexmedetomidine (PRECEDEX) IV infusion Stopped (01/01/18 1942)  . fentaNYL infusion INTRAVENOUS Stopped (01/01/18 1027)  . levETIRAcetam Stopped (01/02/18 0544)  . piperacillin-tazobactam (ZOSYN)  IV Stopped (01/02/18 0945)   PRN Meds: [DISCONTINUED] acetaminophen **OR** acetaminophen, haloperidol lactate, hydrALAZINE, levalbuterol, LORazepam, ondansetron **OR** ondansetron (ZOFRAN) IV, sodium chloride flush   Vital Signs    Vitals:   01/02/18 0800 01/02/18 0805 01/02/18 0900 01/02/18 1000  BP:  (!) 175/80 (!) 142/87 (!) 168/72  Pulse: 91 85 96 96  Resp: 12 20 (!) 26 15  Temp: 100.2 F (37.9 C) 100.2 F (37.9 C) 100.2 F (37.9 C) (!) 100.4 F (38 C)  TempSrc:      SpO2: 100% 100% (!) 89% 96%  Weight:      Height:        Intake/Output Summary (Last 24 hours) at 01/02/2018 1107 Last data filed at 01/02/2018 1000 Gross per 24 hour  Intake 1072.75 ml  Output 2435 ml  Net -1362.25 ml   Filed Weights   12/31/17 0436 01/01/18 0500 01/02/18 0500  Weight: 266 lb  5.1 oz (120.8 kg) 262 lb 5.6 oz (119 kg) 255 lb 8.2 oz (115.9 kg)    Telemetry    Atrial fibrillation rate controlled. - Personally Reviewed   Physical Exam   GEN: WD obese NAD Neck: supple Cardiac: irergular Respiratory: CTA GI: Soft, nontender, non-distended, colostomy  MS: no edema Neuro:  Moves all ext  Labs    Chemistry Recent Labs  Lab 12/19/2017 1615  12/31/17 0354 01/01/18 0453 01/02/18 0335  NA 141   < > 142 144 151*  K 4.9   < > 3.0* 3.5 2.9*  CL 108   < > 103 106 111  CO2 18*   < > 28 29 29   GLUCOSE 164*   < > 254* 233* 217*  BUN 24*   < > 33* 62* 61*  CREATININE 1.26*   < > 1.34* 1.55* 1.44*  CALCIUM 10.6*   < > 9.2 9.3 10.2  PROT 7.0  --  5.7*  --   --   ALBUMIN 3.4*  --  2.4*  --   --   AST 14*  --  21  --   --   ALT 19  --  54  --   --   ALKPHOS 64  --  56  --   --   BILITOT 1.9*  --  3.0*  --   --  GFRNONAA 57*   < > 52* 44* 48*  GFRAA >60   < > >60 51* 56*  ANIONGAP 15   < > 11 9 11    < > = values in this interval not displayed.     Hematology Recent Labs  Lab 12/30/17 0506 12/31/17 0354 01/01/18 0453  WBC 12.5* 9.2 9.8  RBC 3.40* 3.18* 3.12*  HGB 9.0* 8.5* 8.6*  HCT 29.0* 27.6* 27.6*  MCV 85.3 86.8 88.5  MCH 26.5 26.7 27.6  MCHC 31.0 30.8 31.2  RDW 15.1 15.0 15.2  PLT 322 244 238    Cardiac Enzymes Recent Labs  Lab 12/19/2017 1615 12/04/2017 2100 12/27/17 0231 12/27/17 0944  TROPONINI 0.06* 0.07* 0.17* 0.15*    BNP Recent Labs  Lab 12/14/2017 1616  BNP 476.0*      Radiology    Dg Chest Portable 1 View  Result Date: 01/02/2018 CLINICAL DATA:  Respiratory failure EXAM: PORTABLE CHEST 1 VIEW COMPARISON:  January 01, 2018 FINDINGS: Endotracheal tube and nasogastric tube have been removed. Central catheter tip is in the superior vena cava. No pneumothorax. There is mild left base atelectasis. The lungs elsewhere are clear. Heart is mildly enlarged with pulmonary vascularity within normal limits. There is aortic atherosclerosis.  No adenopathy. No bone lesions. IMPRESSION: Central catheter as described without pneumothorax. Left base atelectasis. No edema or consolidation. Stable cardiac enlargement. There is aortic atherosclerosis. Aortic Atherosclerosis (ICD10-I70.0). Electronically Signed   By: Lowella Grip III M.D.   On: 01/02/2018 07:50   Dg Chest Portable 1 View  Result Date: 01/01/2018 CLINICAL DATA:  Shortness of breath. EXAM: PORTABLE CHEST 1 VIEW COMPARISON:  Radiograph of December 31, 2017. FINDINGS: Stable cardiomediastinal silhouette. Endotracheal and nasogastric tubes are in grossly good position. Right-sided PICC line is noted with tip in expected position of SVC. No pneumothorax is noted. Lungs are clear. Bony thorax is unremarkable. IMPRESSION: Stable support apparatus. No acute cardiopulmonary abnormality seen. Electronically Signed   By: Marijo Conception, M.D.   On: 01/01/2018 10:04   Dg Hand Complete Right  Result Date: 12/31/2017 CLINICAL DATA:  Swelling. EXAM: RIGHT HAND - COMPLETE 3+ VIEW COMPARISON:  None. FINDINGS: There is no evidence of fracture or dislocation. There is no evidence of arthropathy or other focal bone abnormality. Mild generalized soft tissue swelling. Soft tissues are otherwise unremarkable. IMPRESSION: 1. Mild generalized soft tissue swelling. No acute osseous abnormality or significant degenerative changes. Electronically Signed   By: Titus Dubin M.D.   On: 12/31/2017 16:49   US Thyroid  Result Date: 01/01/2018 CLINICAL DATA:  Thyroid storm, hyperthyroidism EXAM: THYROID ULTRASOUND TECHNIQUE: Ultrasound examination of the thyroid gland and adjacent soft tissues was performed. COMPARISON:  None. FINDINGS: Parenchymal Echotexture: Moderately heterogenous Isthmus: 4 mm Right lobe: 5.0 x 2.3 x 2.4 cm Left lobe: 4.4 x 2.0 x 1.7 cm _________________________________________________________ Estimated total number of nodules >/= 1 cm: 2 Number of spongiform nodules >/=  2 cm not described  below (TR1): 0 Number of mixed cystic and solid nodules >/= 1.5 cm not described below (TR2): 0 _________________________________________________________ Nodule # 1: Location: Right; Mid Maximum size: 1.5 cm; Other 2 dimensions: 1.1 x 1.2 cm Composition: solid/almost completely solid (2) Echogenicity: hypoechoic (2) Shape: not taller-than-wide (0) Margins: ill-defined (0) Echogenic foci: peripheral calcifications (2) ACR TI-RADS total points: 6. ACR TI-RADS risk category: TR4 (4-6 points). ACR TI-RADS recommendations: **Given size (>/= 1.5 cm) and appearance, fine needle aspiration of this moderately suspicious nodule should be considered based on  TI-RADS criteria. _________________________________________________________ Nodule # 2: Location: Left; Inferior Maximum size: 1.4 cm; Other 2 dimensions: 1.3 x 1.4 cm Composition: solid/almost completely solid (2) Echogenicity: isoechoic (1) Shape: not taller-than-wide (0) Margins: ill-defined (0) Echogenic foci: none (0) ACR TI-RADS total points: 3. ACR TI-RADS risk category: TR3 (3 points). ACR TI-RADS recommendations: Given size (<1.4 cm) and appearance, this nodule does NOT meet TI-RADS criteria for biopsy or dedicated follow-up. _________________________________________________________ Nonspecific moderate gland heterogeneity. No hypervascularity. No adenopathy. IMPRESSION: 1.5 cm right mid thyroid TR 4 nodule meets criteria for biopsy as above. 1.4 cm left inferior TR 3 nodule does not meet criteria for any biopsy or follow-up. Nonspecific gland heterogeneity. The above is in keeping with the ACR TI-RADS recommendations - J Am Coll Radiol 2017;14:587-595. Electronically Signed   By: Jerilynn Mages.  Shick M.D.   On: 01/01/2018 08:19    Cardiac Studies   Echo 12/27/17: ------------------------------------------------------------------- Study Conclusions - Left ventricle: The cavity size was normal. There was moderate concentric hypertrophy. Systolic function was normal.  The estimated ejection fraction was in the range of 60% to 65%. Possible akinesis of the basalinferior myocardium. Doppler parameters are consistent with high ventricular filling pressure. - Aortic valve: Trileaflet; mildly thickened, mildly calcified leaflets. - Aorta: Aortic root dimension: 37 mm (ED). - Aortic root: The aortic root was mildly dilated. - Mitral valve: There was trivial regurgitation. - Left atrium: The atrium was severely dilated. - Right atrium: The atrium was severely dilated. - Tricuspid valve: There was trivial regurgitation. - Pulmonary arteries: Systolic pressure could not be accurately estimated.  Patient Profile     72 y.o. male with a hx of atrial fibrillation previously on diltiazem and coumadin, HTN, HLD, thoracic aortic aneurysm, DM, CKD stage III, colon cancer (2010) s/p resection and colostomy, and renal cancer (2010)who  is being seen foratrial fibrillation with RVR.  Assessment & Plan    1. Permanent atrial fibrillation with RVR Continue inderal and cardizem for rate control; no anticoagulation given SDH.   2. Hypertension BP improving; should improve with continued therapy of hyperthyroidism   3. Acute diastolic heart failure Prerenal and not volume overloaded on exam; hold diuretics.   4. Bilateral subdural hematoma, seizure Hold anticoagulation.  5. Hyperthyroid Per IM  We will see again Monday; please call over weekend with questions  For questions or updates, please contact [place] Please consult [email or web address] for contact info under Cardiology/STEMI.      Signed, Kirk Ruths, MD  01/02/2018, 11:07 AM

## 2018-01-02 NOTE — Progress Notes (Signed)
234ml of fentanyl (253mcg in NS 262ml) wasted in sink with Jannifer Franklin, Therapist, sports.

## 2018-01-02 NOTE — Progress Notes (Signed)
Nutrition Follow-up  DOCUMENTATION CODES:   Obesity unspecified  INTERVENTION:   Tube Feeding via Cortrak:  Vital 1.5 @ 60 ml/hr Pro-Stat 30 mL BID Provides 2360 kcals, 128 g of protein and 1094 mL of free water  NUTRITION DIAGNOSIS:   Inadequate oral intake related to acute illness as evidenced by NPO status.  Being addressed via TF  GOAL:   Patient will meet greater than or equal to 90% of their needs  Progressing  MONITOR:   TF tolerance, Labs, Weight trends, Skin  REASON FOR ASSESSMENT:   Consult, Ventilator Enteral/tube feeding initiation and management  ASSESSMENT:    72 yo male admitted with acute respiratory failure with acute pulmonary edem and possible aspiration pneumonia; pt also with acute metabolic encephalopathy, bilateral subdural hematomas, seizures and thyroid storm. Pt with hx of CKD III, DM, adenocarcinoma of colon s/p resection and colostomy, renal cell carcinoma   4/5 Extubated 4/6 Plan to place Cortrak tube, pt not able to safely swallow  Pt remains confused  Weight has trended down overall since admission; net -6.7 L per I/O flow sheet which likely explains weight loss  Labs: sodium 151, potassium 2.9 Meds: reviewed  Diet Order:  Diet NPO time specified  EDUCATION NEEDS:   Not appropriate for education at this time  Skin:  Skin Assessment: Reviewed RN Assessment  Last BM:  4/4 colostomy  Height:   Ht Readings from Last 1 Encounters:  12/31/17 6\' 2"  (1.88 m)    Weight:   Wt Readings from Last 1 Encounters:  01/02/18 255 lb 8.2 oz (115.9 kg)    Ideal Body Weight:  86 kg  BMI:  Body mass index is 32.81 kg/m.  Estimated Nutritional Needs:   Kcal:  2200-2500 kcals  Protein:  115-130 g  Fluid:  >/= 2 L   Kerman Passey MS, RD, LDN, CNSC [phone] Pager  [phone] Weekend/On-Call Pager

## 2018-01-03 ENCOUNTER — Inpatient Hospital Stay (HOSPITAL_COMMUNITY): Payer: Medicare Other

## 2018-01-03 ENCOUNTER — Encounter (HOSPITAL_COMMUNITY): Payer: Self-pay

## 2018-01-03 DIAGNOSIS — R0602 Shortness of breath: Secondary | ICD-10-CM

## 2018-01-03 LAB — CBC WITH DIFFERENTIAL/PLATELET
Basophils Absolute: 0 10*3/uL (ref 0.0–0.1)
Basophils Relative: 0 %
EOS ABS: 0 10*3/uL (ref 0.0–0.7)
EOS PCT: 0 %
HCT: 36 % — ABNORMAL LOW (ref 39.0–52.0)
HEMOGLOBIN: 10.5 g/dL — AB (ref 13.0–17.0)
LYMPHS ABS: 2 10*3/uL (ref 0.7–4.0)
LYMPHS PCT: 15 %
MCH: 26.1 pg (ref 26.0–34.0)
MCHC: 29.2 g/dL — AB (ref 30.0–36.0)
MCV: 89.3 fL (ref 78.0–100.0)
MONOS PCT: 8 %
Monocytes Absolute: 1.1 10*3/uL — ABNORMAL HIGH (ref 0.1–1.0)
NEUTROS PCT: 77 %
Neutro Abs: 10.4 10*3/uL — ABNORMAL HIGH (ref 1.7–7.7)
Platelets: 380 10*3/uL (ref 150–400)
RBC: 4.03 MIL/uL — ABNORMAL LOW (ref 4.22–5.81)
RDW: 15.2 % (ref 11.5–15.5)
WBC: 13.5 10*3/uL — ABNORMAL HIGH (ref 4.0–10.5)

## 2018-01-03 LAB — CBC
HCT: 34.4 % — ABNORMAL LOW (ref 39.0–52.0)
Hemoglobin: 10.2 g/dL — ABNORMAL LOW (ref 13.0–17.0)
MCH: 26.6 pg (ref 26.0–34.0)
MCHC: 29.7 g/dL — ABNORMAL LOW (ref 30.0–36.0)
MCV: 89.8 fL (ref 78.0–100.0)
Platelets: 357 10*3/uL (ref 150–400)
RBC: 3.83 MIL/uL — ABNORMAL LOW (ref 4.22–5.81)
RDW: 15.5 % (ref 11.5–15.5)
WBC: 11.4 10*3/uL — ABNORMAL HIGH (ref 4.0–10.5)

## 2018-01-03 LAB — TROPONIN I
Troponin I: 0.37 ng/mL (ref <0.03)
Troponin I: 0.33 ng/mL (ref <0.03)

## 2018-01-03 LAB — LACTIC ACID, PLASMA
LACTIC ACID, VENOUS: 1.9 mmol/L (ref 0.5–1.9)
Lactic Acid, Venous: 1.8 mmol/L (ref 0.5–1.9)

## 2018-01-03 LAB — GLUCOSE, CAPILLARY
GLUCOSE-CAPILLARY: 283 mg/dL — AB (ref 65–99)
GLUCOSE-CAPILLARY: 296 mg/dL — AB (ref 65–99)
Glucose-Capillary: 280 mg/dL — ABNORMAL HIGH (ref 65–99)
Glucose-Capillary: 302 mg/dL — ABNORMAL HIGH (ref 65–99)
Glucose-Capillary: 314 mg/dL — ABNORMAL HIGH (ref 65–99)
Glucose-Capillary: 300 mg/dL — ABNORMAL HIGH (ref 65–99)

## 2018-01-03 LAB — CULTURE, BLOOD (ROUTINE X 2)
CULTURE: NO GROWTH
Culture: NO GROWTH
SPECIAL REQUESTS: ADEQUATE
Special Requests: ADEQUATE

## 2018-01-03 LAB — COMPREHENSIVE METABOLIC PANEL
ALBUMIN: 2.4 g/dL — AB (ref 3.5–5.0)
ALT: 165 U/L — ABNORMAL HIGH (ref 17–63)
ANION GAP: 11 (ref 5–15)
AST: 48 U/L — ABNORMAL HIGH (ref 15–41)
Alkaline Phosphatase: 86 U/L (ref 38–126)
BUN: 40 mg/dL — ABNORMAL HIGH (ref 6–20)
CHLORIDE: 120 mmol/L — AB (ref 101–111)
CO2: 28 mmol/L (ref 22–32)
Calcium: 10.1 mg/dL (ref 8.9–10.3)
Creatinine, Ser: 1.41 mg/dL — ABNORMAL HIGH (ref 0.61–1.24)
GFR calc non Af Amer: 49 mL/min — ABNORMAL LOW (ref >60)
GFR, EST AFRICAN AMERICAN: 57 mL/min — AB (ref >60)
GLUCOSE: 318 mg/dL — AB (ref 65–99)
POTASSIUM: 4.1 mmol/L (ref 3.5–5.1)
SODIUM: 159 mmol/L — AB (ref 135–145)
Total Bilirubin: 0.9 mg/dL (ref 0.3–1.2)
Total Protein: 5.8 g/dL — ABNORMAL LOW (ref 6.5–8.1)

## 2018-01-03 LAB — BASIC METABOLIC PANEL
Anion gap: 8 (ref 5–15)
Anion gap: 9 (ref 5–15)
BUN: 43 mg/dL — AB (ref 6–20)
BUN: 39 mg/dL — AB (ref 6–20)
CHLORIDE: 121 mmol/L — AB (ref 101–111)
CO2: 29 mmol/L (ref 22–32)
CO2: 29 mmol/L (ref 22–32)
Calcium: 10.3 mg/dL (ref 8.9–10.3)
Calcium: 10.1 mg/dL (ref 8.9–10.3)
Chloride: 122 mmol/L — ABNORMAL HIGH (ref 101–111)
Creatinine, Ser: 1.33 mg/dL — ABNORMAL HIGH (ref 0.61–1.24)
Creatinine, Ser: 1.42 mg/dL — ABNORMAL HIGH (ref 0.61–1.24)
GFR calc Af Amer: 57 mL/min — ABNORMAL LOW (ref >60)
GFR calc non Af Amer: 53 mL/min — ABNORMAL LOW (ref >60)
GFR, EST NON AFRICAN AMERICAN: 49 mL/min — AB (ref >60)
Glucose, Bld: 278 mg/dL — ABNORMAL HIGH (ref 65–99)
Glucose, Bld: 322 mg/dL — ABNORMAL HIGH (ref 65–99)
POTASSIUM: 3.5 mmol/L (ref 3.5–5.1)
POTASSIUM: 4 mmol/L (ref 3.5–5.1)
SODIUM: 158 mmol/L — AB (ref 135–145)
Sodium: 160 mmol/L — ABNORMAL HIGH (ref 135–145)

## 2018-01-03 LAB — BLOOD GAS, ARTERIAL
Acid-Base Excess: 6.9 mmol/L — ABNORMAL HIGH (ref 0.0–2.0)
Bicarbonate: 30.9 mmol/L — ABNORMAL HIGH (ref 20.0–28.0)
DRAWN BY: 521601
FIO2: 28
O2 CONTENT: 2 L/min
O2 Saturation: 96.4 %
PO2 ART: 84.3 mmHg (ref 83.0–108.0)
Patient temperature: 98.6
pCO2 arterial: 44.1 mmHg (ref 32.0–48.0)
pH, Arterial: 7.459 — ABNORMAL HIGH (ref 7.350–7.450)

## 2018-01-03 LAB — PROCALCITONIN

## 2018-01-03 MED ORDER — INSULIN ASPART 100 UNIT/ML ~~LOC~~ SOLN
0.0000 [IU] | SUBCUTANEOUS | Status: DC
  Administered 2018-01-03: 15 [IU] via SUBCUTANEOUS
  Administered 2018-01-03 (×2): 11 [IU] via SUBCUTANEOUS
  Administered 2018-01-04: 4 [IU] via SUBCUTANEOUS
  Administered 2018-01-04: 11 [IU] via SUBCUTANEOUS
  Administered 2018-01-04: 15 [IU] via SUBCUTANEOUS
  Administered 2018-01-04: 4 [IU] via SUBCUTANEOUS
  Administered 2018-01-04 – 2018-01-05 (×3): 11 [IU] via SUBCUTANEOUS
  Administered 2018-01-05: 4 [IU] via SUBCUTANEOUS
  Administered 2018-01-05: 7 [IU] via SUBCUTANEOUS
  Administered 2018-01-05: 20 [IU] via SUBCUTANEOUS
  Administered 2018-01-05 – 2018-01-06 (×4): 7 [IU] via SUBCUTANEOUS
  Administered 2018-01-06: 11 [IU] via SUBCUTANEOUS
  Administered 2018-01-06 (×2): 7 [IU] via SUBCUTANEOUS
  Administered 2018-01-07: 15 [IU] via SUBCUTANEOUS
  Administered 2018-01-07: 20 [IU] via SUBCUTANEOUS
  Administered 2018-01-07: 15 [IU] via SUBCUTANEOUS
  Administered 2018-01-07: 11 [IU] via SUBCUTANEOUS
  Administered 2018-01-07: 7 [IU] via SUBCUTANEOUS

## 2018-01-03 MED ORDER — DEXTROSE 5 % IV SOLN
INTRAVENOUS | Status: DC
  Administered 2018-01-03 – 2018-01-05 (×4): via INTRAVENOUS

## 2018-01-03 MED ORDER — METOPROLOL TARTRATE 5 MG/5ML IV SOLN
5.0000 mg | Freq: Once | INTRAVENOUS | Status: AC
  Administered 2018-01-03: 5 mg via INTRAVENOUS
  Filled 2018-01-03: qty 5

## 2018-01-03 MED ORDER — PIPERACILLIN-TAZOBACTAM 3.375 G IVPB
3.3750 g | Freq: Three times a day (TID) | INTRAVENOUS | Status: DC
  Administered 2018-01-03 – 2018-01-05 (×6): 3.375 g via INTRAVENOUS
  Filled 2018-01-03 (×9): qty 50

## 2018-01-03 MED ORDER — INSULIN GLARGINE 100 UNIT/ML ~~LOC~~ SOLN
6.0000 [IU] | Freq: Every day | SUBCUTANEOUS | Status: DC
  Administered 2018-01-03 – 2018-01-05 (×3): 6 [IU] via SUBCUTANEOUS
  Filled 2018-01-03 (×5): qty 0.06

## 2018-01-03 MED ORDER — POTASSIUM CHLORIDE 20 MEQ/15ML (10%) PO SOLN
40.0000 meq | Freq: Two times a day (BID) | ORAL | Status: DC
  Administered 2018-01-03 – 2018-01-10 (×16): 40 meq
  Filled 2018-01-03 (×16): qty 30

## 2018-01-03 MED ORDER — FREE WATER
400.0000 mL | Freq: Four times a day (QID) | Status: DC
  Administered 2018-01-03 – 2018-01-05 (×5): 400 mL

## 2018-01-03 MED ORDER — INSULIN ASPART 100 UNIT/ML ~~LOC~~ SOLN: 0.0000 [IU] | Freq: Three times a day (TID) | SUBCUTANEOUS | Status: DC

## 2018-01-03 MED ORDER — DILTIAZEM 12 MG/ML ORAL SUSPENSION
60.0000 mg | Freq: Three times a day (TID) | ORAL | Status: DC
  Administered 2018-01-03 – 2018-01-05 (×9): 60 mg
  Filled 2018-01-03 (×18): qty 6

## 2018-01-03 MED ORDER — FREE WATER: 200.0000 mL | Freq: Three times a day (TID) | Status: DC

## 2018-01-03 NOTE — Progress Notes (Signed)
VASCULAR LAB PRELIMINARY  PRELIMINARY  PRELIMINARY  PRELIMINARY  Bilateral lower extremity venous duplex completed.    Preliminary report:  There is no DVT or SVT noted in the bilateral lower extremities.   Adiva Boettner, RVT 01/03/2018, 5:30 PM

## 2018-01-03 NOTE — Progress Notes (Signed)
Brief narrative 72 year old male who I originally saw after a partial seizure in the setting of subdural hematoma.  He was transferred to [facility] where he did not have any further seizures, but did get short of breath requiring intubation on 4/2.  He subsequently improved repeat CT scan at that time was negative  Exam: Vitals:   01/03/18 1205 01/03/18 1455  BP:  (!) 170/91  Pulse:    Resp:    Temp: (!) 100.4 F (38 C)   SpO2:     Gen: In bed, NAD Resp: non-labored breathing, no acute distress Abd: soft, nt  Neuro: MS: Lethargic but does arouse, only intermittently follows commands CN: Pupils equal round and reactive to light, extra ocular movements intact, appears to blink to threat bilaterally Motor: He moves both sides with good strength, I do have a question of whether he moves the right side slightly less than the left, but this is by no means definite Sensory: He responds to noxious stimulation in all 4 extremities  Pertinent Labs: Sodium 158  I have reviewed his repeat CT which appears to show improvement in the subdurals.  Impression: 72 year old male with worsening encephalopathy.  I suspect this is metabolic secondary to his significant jump in sodium overnight.  No clear focality on exam, though a repeat EEG could be reasonable.  Recommendations: 1) EEG 2) correction of metabolic abnormalities per internal medicine  Roland Rack, MD [facility] [phone]  If 7pm- 7am, please page neurology on call as listed in [place].

## 2018-01-03 NOTE — Progress Notes (Addendum)
PULMONARY / CRITICAL CARE MEDICINE   Name: [name] MRN: [id number] DOB: 1945-10-30    ADMISSION DATE:  12/18/2017 CONSULTATION DATE: 12/30/2017  REFERRING MD:  Karie Kirks  CHIEF COMPLAINT:  Shortness of breath   HISTORY OF PRESENT ILLNESS:   [name] is a 72 yr old male with history of a fib, stage 3 chronic kidney disease, DM2, adenocarcinoma of colon s/p resection and current colostomy, history of renal cell carcinoma s/o resection who was admitted on 12/01/2017 for shortnes s of breath, supratherputic INR,  afib with RVR and pulm edema. During his stay he was diagnosed with thyrotoxicosis and bilateral SDH and seizure. On 4/2 PCCM consulted for increased work of breathing, hypoxia, AMS.  He was intubated and tx to ICU.     Subjective: Extubated.  Transferred out of ICU on 4/5 Called back on 4/6 as he became acutely dyspneic with tachypnea Chest x-ray is better than before and ABG shows mild alkalosis  VITAL SIGNS: BP (!) 188/105 (BP Location: Left Arm)   Pulse (!) 120   Temp (!) 100.4 F (38 C) (Axillary)   Resp (!) 38   Ht 6\' 2"  (1.88 m)   Wt 257 lb 15 oz (117 kg)   SpO2 94%   BMI 33.12 kg/m   HEMODYNAMICS:    VENTILATOR SETTINGS:    INTAKE / OUTPUT: I/O last 3 completed shifts: In: 2359 [I.V.:223; Other:50; YT/KZ:6010; IV Piggyback:300] Out: 2880 [Urine:2655; Stool:225]  PHYSICAL EXAMINATION: Gen:      No acute distress HEENT:  EOMI, sclera anicteric Neck:     No masses; no thyromegaly Lungs:    Clear to auscultation bilaterally; normal respiratory effort CV:         Regular rate and rhythm; no murmurs Abd:      + bowel sounds; soft, non-tender; no palpable masses, no distension Ext:    No edema; adequate peripheral perfusion Skin:      Warm and dry; no rash Neuro: Somnolent, opens eyes to painful stimuli.  LABS:  BMET Recent Labs  Lab 01/01/18 0453 01/02/18 0335 01/03/18 0311  NA 144 151* 158*  K 3.5 2.9* 3.5  CL 106 111 121*  CO2 29 29 29    BUN 62* 61* 43*  CREATININE 1.55* 1.44* 1.33*  GLUCOSE 233* 217* 278*    Electrolytes Recent Labs  Lab 12/31/17 1615 01/01/18 0453 01/01/18 1819 01/02/18 0335 01/03/18 0311  CALCIUM  --  9.3  --  10.2 10.3  MG 1.8 2.3 2.2  --   --   PHOS 3.7 4.8* 3.4  --   --     CBC Recent Labs  Lab 12/31/17 0354 01/01/18 0453 01/03/18 0311  WBC 9.2 9.8 11.4*  HGB 8.5* 8.6* 10.2*  HCT 27.6* 27.6* 34.4*  PLT 244 238 357    Coag's Recent Labs  Lab 12/29/17 1838 12/30/17 0506 12/31/17 0354  INR 1.29 1.34 1.36    Sepsis Markers Recent Labs  Lab 12/30/17 1942 12/31/17 0913 01/01/18 0453 01/02/18 0335  LATICACIDVEN 1.0  --   --   --   PROCALCITON  --  <0.10 <0.10 <0.10    ABG Recent Labs  Lab 12/29/17 1246 12/30/17 1459 01/03/18 1248  PHART 7.493* 7.509* 7.459*  PCO2ART 37.8 40.0 44.1  PO2ART 107 176* 84.3    Liver Enzymes Recent Labs  Lab 12/31/17 0354  AST 21  ALT 54  ALKPHOS 56  BILITOT 3.0*  ALBUMIN 2.4*    Cardiac Enzymes No results for input(s): TROPONINI,  PROBNP in the last 168 hours.  Glucose Recent Labs  Lab 01/02/18 1639 01/02/18 2038 01/02/18 2357 01/03/18 0431 01/03/18 0722 01/03/18 1153  GLUCAP 261* 241* 259* 283* 280* 302*    Imaging Dg Chest Port 1 View  Result Date: 01/03/2018 CLINICAL DATA:  Respiratory failure EXAM: PORTABLE CHEST 1 VIEW COMPARISON:  01/02/2018 FINDINGS: Interval placement of feeding tube entering the stomach with the tip not visualized. Right arm PICC tip in the SVC has been placed since the prior study Improved aeration of the lung. Improved lung volume with decrease in bibasilar atelectasis. Negative for edema or effusion IMPRESSION: Interval placement of feeding tube and right arm PICC with the tip in the SVC Improved lung volume with decrease in bibasilar atelectasis. Electronically Signed   By: Franchot Gallo M.D.   On: 01/03/2018 09:01   STUDIES:  CT head 12/28/2017 >>1. Bilateral subdural hematomas,  right larger than left. Focal collection of blood in the right frontoparietal region with hematocrit level, ongoing active bleeding not excluded. Short interval follow-up recommended. 2. Minimal mass effect without significant midline shift. MRI brian 4/1>>> 1. Stable right greater than left subdural hematomas since the head CT yesterday.  The right side subdural demonstrates greater lobulation and signal heterogeneity measuring between 8 and 17 mm in thickness. The smaller more homogeneous left side subdural measures 6-7 mm in thickness. 2. Stable mild intracranial mass effect with trace leftward midline shift. Normal basilar cisterns. Trace intraventricular hemorrhage with no ventriculomegaly. 3. No superimposed acute infarct. CT head 4/2>>> 1. Stable right larger than left mixed attenuation subdural hematomas. Stable minimal associated mass effect. No herniation. 2. No new acute intracranial abnormality identified. US thyroid 4/4>>> 1.5 cm right mid thyroid TR 4 nodule meets criteria for biopsy as Above.  1.4 cm left inferior TR 3 nodule does not meet criteria for any biopsy or follow-up.   Nonspecific gland heterogeneity.  CULTURES: BC x 2 3/29>>> 1/2 coag neg staph>> suspect contaminant  BC x 2 4/1>>> neg   LINES/TUBES:  ETT 4/2>> 4/4 RUA PICC 3/30>>>  ANTIBIOTICS: Zosyn 3/30>> 4/5, restart 4/6 Vanc 3/31>>>4/3  SIGNIFICANT EVENTS: 12/30/2017 transfer to ICU for worsening resp failure and mental status with afib RVR  01/01/2018>> Extubated  4/5 >> Transfer back to ICU with similar presentation  DISCUSSION: 72 year old with atrial fibrillation, thyrotoxicosis, subdural hemorrhage, seizures Transferred to the ICU 4/2 for acute respiratory failure requiring intubation. Extubated and transferred out  PCCM called back 4/6 for tachypnea, altered mental status of unclear etiology No clear evidence of aspiration or worsening lung infiltrate. Transfer back to the ICU for closer  monitoring. Discussed with neurology.  Get stat head CT, restart antibiotics, recheck labs  ASSESSMENT / PLAN:  PULMONARY A: Acute hypoxemic resp failure ?aspiration PNA - less likely. Pct neg.  ? PE but unable to get CTA due to high Cr or anticoagulate due to SDH P:   Transferring back to the ICU Close monitoring for re intubation need Restart Zosyn although suspicion for infection is low Check labs including pro-calcitonin, lactic acid, cultures LE ultrasound, Echo to check RV   CARDIOVASCULAR A:  HTN emergency Acute dias/tolic CHF pulm edema  Atrial fibrillation with RVR  2D echo 3//30>> EF 600-65%, mod hypertrophy, trivial Dwayne, trivial TR, severely dilated RA and LA P:  Continue Cardizem. IV lopressor Propranolol PO Cardiology following.   RENAL A:   AKI Hypernatremia  P:   Trend  chem  Start D5W, Hold free water  GASTROINTESTINAL Failed swallow ?  Aspiration  P:   PPI  Hold tube feeds  HEMATOLOGIC A:   Leukocytosis most likely reactive>> improving Anemia - mild  P:  Follow CBC  INFECTIOUS A:   ?aspiration PNA - less likely. Pct neg.   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 CT head Continue keppra  Hold all anticoagulation  Correct thyroid storm as above Neuro following   FAMILY  - Updates: Wife updated at bedside - Inter-disciplinary family meet or Palliative Care meeting due by:  12/30/2017   The patient is critically ill with multiple organ system failure and requires high complexity decision making for assessment and support, frequent evaluation and titration of therapies, advanced monitoring, review of radiographic  studies and interpretation of complex data.   Critical Care Time devoted to patient care services, exclusive of separately billable procedures, described in this note is 35 minutes.   Marshell Garfinkel MD [facility] Pager [phone] If no answer or after 3pm call: [phone] 01/03/2018, 2:03 PM

## 2018-01-03 NOTE — Progress Notes (Signed)
Pt transported to CT scan with SWAT nurse Rip Harbour. Pt wife at bedside. Made aware that pt would be transferring to 4N ICU. Report called to Tammy RN on 4[facility] ICU. Wife notified and went to 4N to meet pt. Carroll Kinds RN

## 2018-01-03 NOTE — Progress Notes (Signed)
Dr. Lonny Prude on floor. Notified of pt condition changes, ie: BP, resp, CBG, and temp. Dr. Lonny Prude to see pt. Cont to monitor. Carroll Kinds RN

## 2018-01-03 NOTE — Progress Notes (Signed)
Pharmacy Antibiotic Note  [name] is a 72 y.o. male admitted on 12/27/2017 with pneumonia and sepsis.  Pharmacy has been consulted for Zosyn dosing.  Presented on 3/30 for SOB and dyspnea. Was intubated recently in the past week. Also treated with Vancomycin and Zosyn for 7 days until 4/5.  Plan: Zosyn 3.375g IV EID q8h Monitor clinic progress, WBC, TMax, renal function, electrolytes F/u BCx, C/S, future de-escalation, & length of therapy   Height: 6\' 2"  (188 cm) Weight: 257 lb 15 oz (117 kg) IBW/kg (Calculated) : 82.2  Temp (24hrs), Avg:99.8 F (37.7 C), Min:99 F (37.2 C), Max:100.4 F (38 C)  Recent Labs  Lab 12/29/17 0430 12/30/17 0506 12/30/17 1942 12/31/17 0151 12/31/17 0354 01/01/18 0453 01/02/18 0335 01/03/18 0311  WBC 12.7* 12.5*  --   --  9.2 9.8  --  11.4*  CREATININE 1.44* 1.47*  --   --  1.34* 1.55* 1.44* 1.33*  LATICACIDVEN  --   --  1.0  --   --   --   --   --   VANCOTROUGH  --   --   --  11*  --   --   --   --     Estimated Creatinine Clearance: 71.3 mL/min (A) (by C-G formula based on SCr of 1.33 mg/dL (H)).    No Known Allergies  Antimicrobials this admission: Vancomycin 3/29>>4/3 Zosyn 3/30>>4/5  Thank you for allowing pharmacy to be a part of this patient's care.  Nida Boatman, PharmD PGY1 Acute Care Pharmacy Resident Pager: [phone] 01/03/2018 1:33 PM

## 2018-01-03 NOTE — Progress Notes (Signed)
Called by unit RT for STAT bipap.  Noted pt minimal responsiveness, RR 40's, sat 100% on House,  RN and MD at bedside.   Pt placed on bipap, currently tol well.   RR now 30's, sat 100%, [facility]BS[facility] diminished t/o.

## 2018-01-03 NOTE — Progress Notes (Signed)
CRITICAL VALUE ALERT  Critical Value:  Troponin 0.37  Date & Time Notied:  01/03/2018 1641  Provider Notified: Mannam CCMD   Orders Received/Actions taken: MD notified and at patient bedside currently

## 2018-01-03 NOTE — Progress Notes (Addendum)
PROGRESS NOTE  [name] PPJ:[id number] DOB: [birth year] DOA: 12/03/2017 PCP: Lemmie Evens, MD  HPI/Recap of past 24 hours:  [name] is a 72 y.o. year old male with medical history significant for atrial fibrillation with diltiazem and Coumadin, hypertension, thoracic aortic aneurysm, hyperlipidemia, type 2 diabetes, CKD stage III, colon cancer status post resection and colostomy (2010), renal cancer (2010) who presented on 12/11/2017 with worsening dyspnea with minimal exertion for the past month and was found to have a fibrillation with RVR, supratherapeutic INR greater than 10, and acute diastolic heart failure she was admitted to stepdown unit and Cardizem drip as well as vitamin K supplementation.  Course complicated by acute toxic metabolic encephalopathy related to small bilateral subdural hematomas in setting of subtherapeutic INR, tonic-clonic seizures.  Of Note patient was transferred to [facility] on the night of 3/31-4/1 given his multiple acute issues  His persistent tachycardia and atrial fibrillation with RVR refraction to medical treatment  was deemed secondary to thyroid storm  He was transferred to floor overnight on 4/5 after doing well after his extubation on 4/4    Subjective  Wife at bedside. Patient somnolent. Occasionally nods head yes or no to my questions.   Assessment/Plan: Principal Problem:   Atrial fibrillation with RVR (HCC) Active Problems:   Hypertension   Diabetes mellitus without complication (HCC)   Stage 3 chronic kidney disease (HCC)   Acquired solitary kidney   Supratherapeutic INR   Heme positive stool   Acute systolic CHF (congestive heart failure) (HCC)   Elevated troponin   Hypertensive emergency   Acute congestive heart failure (HCC)   Acute respiratory failure with hypoxia (HCC)   Acute diastolic (congestive) heart failure (HCC)   Subdural hemorrhage (HCC)   Acute metabolic encephalopathy   Thyrotoxicosis with thyrotoxic  crisis   #Hyperthyroidism- Previously was rate controlled after deescalation from IV dilt drip to oral ( by g tube). However now HR range 120-150, not persistently staying above 120s but with worsening tachypnea I will give a one time dose IV metoprolol and reassess. Still on Solumedrol BID dose(weaning) for thyroid storm treatment and methimazole QID regimen.  #Permanent atrial fibrillation with RVR- seems to be going back into RVR with HR ranging from 115-150 on chart review and telemetry monitor. It is unclear if this represents thyroid storm again, sepsis, or requirement for higher BB.  Will give IV metoprolol 5 mg x1 and reassess. Low threshold to re-initiate diltiazem drip. Appreciate cardiology recommendations.  No anticoagulations setting of subdural hemorrhage.   Continue Inderal and Cardizem for now.  #SIRS Criteria-- worsening tachycardia, significant tachypnea in the 50s, febrile to 100.4 and slight leukocytosis. No obvious source. Recent CXR this morning unrevealing. Patient completed course of zosyn for presumed aspiration PNA and previous blood cultures negative. - reinitiated Zosyn - Obtain blood cultures -f/u stat ABG - PCCM consulted given concern with significant tachypnea that patient will soon tire out - treat HR as mentioned above  #Metabolic encephalopathy-worse-multifactorial etiology: Subdural hematoma, new onset seizures, metabolic imbalances related to thyroid storm. Not very interactive for me this morning and different per wife's assessment and chart review.  Possible patient could be retaining CO2 ( though he is quite tachypneic). Possible could be worsening SDH ( last CT on 4/2) - f/u stat ABG - CT head stat - Discuss w/neurology if EEG warranged -  Continue Keppra (plan to continue for several months per neurology recommendations), will likely discuss with neurosurgery at that time to hold  off on antibiotics once medically stable  #Acute Hypernatremia- in setting  of decreased PO intake, though patient is getting TF.  Water deficit calculated at 6.2 L.  Na now 158 which is not gonna help him neurologically.  - start free water flushes ( 400 g 6H x 3 days) - monitor on BMP  #Supratherapeutic INR-Resolved Led to bilateral subdural hematomas and sees neurosurgery recommended conservative management, repeat CT showed stable hematoma), gross hematuria (evaluated by urology, who recommended outpatient cystoscopy) which improved with correction of his INR  #New onset seizures-stable on Keppra.  Appreciate neurology recommendations.  Risk factors being thyroid storm causing metabolic derangements and effect of bilateral subdural hematomas  #Acute blood loss anemia-stable related to supratherapeutic INR on admission.  GI was consulted any pain with blood in ostomy bag, was seen by Dr. Lovey Newcomer filter recommended PPI, and colonoscopy in 2 months.  CT abdomen was negative for any intra-abdominal pathology #Acute diastolic heart failure-euvolemic.  Most likely tachycardic mediated. EF 60-65% on TTE on 3/30.  Chest x-ray on admission with significant pulmonary edema.  Diuresis with Lasix  #Bilateral subdural hematomas stable.  Likely secondary to supratherapeutic INR. Last CT head on 4/2 stable.  - plan to repeat CT head and talk with Neurology about necessity of EEG for evaluation of worsening confusion  #Hypertension-poorly controlled. .  Difficulty with control in the setting of thyroid storm during hospitalization  #Colon cancer status post resection and colostomy (2010)  #CKD stage III-stable Baseline creatinine 1.2-1.3.  Avoid nephrotoxins.  Continue to follow BMP  #Type 2 diabetes, A1c, holding home metformin and p.o. prednisone.  Lantus, monitor CBGs  #Elevated troponin, resolved.  In the setting of elevated heart rate most likely related to demand ischemia.  Appreciate cardiology recommendations  Code Status:  FULL   Family Communication: Wife updated at  bedside-- she would like palliative care consulted   Disposition Plan:  Will likely require transition back to ICU after talking Dr. Vaughan Browner with critical care, awaiting workup as mentioned above.    Consultants:  Neurology, Cardiology, PCCM  Procedures:  Patient on 4/ 2, extubation on 4/4  Antimicrobials: Zosyn 4/6-- Zosyn 3/30>>> 4/5 Vanc 3/31>>>4/3      Cultures:  Blood culture x 2 : 3/29: 2/2 coag neg staph ( suspect contaminant)  Blood culture x 2 : 4/1 neg  Blood culture x 2: 4/6 pending  Telemetry: yes  DVT prophylaxis: None ( bilateral SDH)   Objective: Vitals:   01/03/18 0428 01/03/18 0435 01/03/18 0724 01/03/18 1122  BP:  (!) 161/94 (!) 183/104 (!) 188/105  Pulse:  (!) 109 85 (!) 120  Resp:   (!) 28 (!) 38  Temp:  99.5 F (37.5 C) 99 F (37.2 C)   TempSrc:  Oral Oral   SpO2: 92%  94%   Weight: 117 kg (257 lb 15 oz)     Height:        Intake/Output Summary (Last 24 hours) at 01/03/2018 1125 Last data filed at 01/03/2018 1011 Gross per 24 hour  Intake 1999 ml  Output 1800 ml  Net 199 ml   Filed Weights   01/01/18 0500 01/02/18 0500 01/03/18 0428  Weight: 119 kg (262 lb 5.6 oz) 115.9 kg (255 lb 8.2 oz) 117 kg (257 lb 15 oz)    Exam:  Constitutional:chronically ill appearing male Eyes: EOMI, anicteric, normal conjunctivae HENT: cortrak tube in place Cardiovascular: irregularly irregular, tachycardic,no peripheral edema Respiratory: Tachypneic, accessory muscle use, able to hear clear breath sounds bilaterally  on 2 L [place] , Abdomen: Soft,non-tender, colostomy bag draining dark brown fluid Skin: No rash ulcers, or lesions. Without skin tenting  Neurologic: Very Somnolent and arousable to pain cues. Nods head yes or no appropriately. Not speaking. Moving hands/arms sporadically with barrier mitts in place   Data Reviewed: CBC: Recent Labs  Lab 12/29/17 0430 12/30/17 0506 12/31/17 0354 01/01/18 0453 01/03/18 0311  WBC 12.7* 12.5* 9.2 9.8  11.4*  NEUTROABS  --   --  7.8*  --   --   HGB 8.1* 9.0* 8.5* 8.6* 10.2*  HCT 25.5* 29.0* 27.6* 27.6* 34.4*  MCV 84.2 85.3 86.8 88.5 89.8  PLT 310 322 244 238 532   Basic Metabolic Panel: Recent Labs  Lab 12/30/17 0506 12/31/17 0354 12/31/17 0930 12/31/17 1615 01/01/18 0453 01/01/18 1819 01/02/18 0335 01/03/18 0311  NA 143 142  --   --  144  --  151* 158*  K 3.1* 3.0*  --   --  3.5  --  2.9* 3.5  CL 104 103  --   --  106  --  111 121*  CO2 27 28  --   --  29  --  29 29  GLUCOSE 246* 254*  --   --  233*  --  217* 278*  BUN 29* 33*  --   --  62*  --  61* 43*  CREATININE 1.47* 1.34*  --   --  1.55*  --  1.44* 1.33*  CALCIUM 9.5 9.2  --   --  9.3  --  10.2 10.3  MG 2.4 2.3 2.1 1.8 2.3 2.2  --   --   PHOS  --  4.5 4.1 3.7 4.8* 3.4  --   --    GFR: Estimated Creatinine Clearance: 71.3 mL/min (A) (by C-G formula based on SCr of 1.33 mg/dL (H)). Liver Function Tests: Recent Labs  Lab 12/31/17 0354  AST 21  ALT 54  ALKPHOS 56  BILITOT 3.0*  PROT 5.7*  ALBUMIN 2.4*   No results for input(s): LIPASE, AMYLASE in the last 168 hours. No results for input(s): AMMONIA in the last 168 hours. Coagulation Profile: Recent Labs  Lab 12/29/17 0430 12/29/17 1007 12/29/17 1838 12/30/17 0506 12/31/17 0354  INR 1.43 1.24 1.29 1.34 1.36   Cardiac Enzymes: No results for input(s): CKTOTAL, CKMB, CKMBINDEX, TROPONINI in the last 168 hours. BNP (last 3 results) No results for input(s): PROBNP in the last 8760 hours. HbA1C: No results for input(s): HGBA1C in the last 72 hours. CBG: Recent Labs  Lab 01/02/18 1639 01/02/18 2038 01/02/18 2357 01/03/18 0431 01/03/18 0722  GLUCAP 261* 241* 259* 283* 280*   Lipid Profile: Recent Labs    12/31/17 2155  TRIG 706*   Thyroid Function Tests: Recent Labs    01/02/18 0335  TSH <0.010*  FREET4 2.14*   Anemia Panel: No results for input(s): VITAMINB12, FOLATE, FERRITIN, TIBC, IRON, RETICCTPCT in the last 72 hours. Urine  analysis:    Component Value Date/Time   COLORURINE AMBER (A) 12/01/2017 2110   APPEARANCEUR CLEAR 12/17/2017 2110   LABSPEC 1.021 12/12/2017 2110   PHURINE 5.0 12/20/2017 2110   GLUCOSEU NEGATIVE 12/15/2017 2110   HGBUR LARGE (A) 12/10/2017 2110   BILIRUBINUR NEGATIVE 12/11/2017 2110   KETONESUR 20 (A) 12/06/2017 2110   PROTEINUR 30 (A) 12/01/2017 2110   NITRITE NEGATIVE 11/30/2017 2110   LEUKOCYTESUR NEGATIVE 12/21/2017 2110   Sepsis Labs: @LABRCNTIP (procalcitonin:4,lacticidven:4)  ) Recent Results (from the past 240 hour(s))  Culture, blood (routine x 2)     Status: Abnormal   Collection Time: 12/25/2017  9:00 PM  Result Value Ref Range Status   Specimen Description   Final    LEFT ANTECUBITAL Performed at [facility], [address], [place], [place] [address]    Special Requests   Final    BOTTLES DRAWN AEROBIC ONLY Blood Culture adequate volume Performed at [facility], [address] [place], [place] [address]    Culture  Setup Time   Final    GRAM POSITIVE COCCI Gram Stain Report Called to,Read Back By and Verified With: HYLTON @ 1937 ON [id number] BY HENDERSON L CRITICAL RESULT CALLED TO, READ BACK BY AND VERIFIED WITH: C HOWARD,RN AT 1931 12/27/17 BY L BENFIELD    Culture (A)  Final    STAPHYLOCOCCUS SPECIES (COAGULASE NEGATIVE) THE SIGNIFICANCE OF ISOLATING THIS ORGANISM FROM A SINGLE SET OF BLOOD CULTURES WHEN MULTIPLE SETS ARE DRAWN IS UNCERTAIN. PLEASE NOTIFY THE MICROBIOLOGY DEPARTMENT WITHIN ONE WEEK IF SPECIATION AND SENSITIVITIES ARE REQUIRED. Performed at [facility], [place] [address], [place], [place] [address]    Report Status 12/30/2017 FINAL  Final  Blood Culture ID Panel (Reflexed)     Status: Abnormal   Collection Time: 12/14/2017  9:00 PM  Result Value Ref Range Status   Enterococcus species NOT DETECTED NOT DETECTED Final   Listeria monocytogenes NOT DETECTED NOT DETECTED Final   Staphylococcus species DETECTED (A) NOT DETECTED Final     Comment: Methicillin (oxacillin) susceptible coagulase negative staphylococcus. Possible blood culture contaminant (unless isolated from more than one blood culture draw or clinical case suggests pathogenicity). No antibiotic treatment is indicated for blood  culture contaminants. CRITICAL RESULT CALLED TO, READ BACK BY AND VERIFIED WITH: C HOWARD,RN AT 1931 12/27/17 BY L BENFIELD    Staphylococcus aureus NOT DETECTED NOT DETECTED Final   Methicillin resistance NOT DETECTED NOT DETECTED Final   Streptococcus species NOT DETECTED NOT DETECTED Final   Streptococcus agalactiae NOT DETECTED NOT DETECTED Final   Streptococcus pneumoniae NOT DETECTED NOT DETECTED Final   Streptococcus pyogenes NOT DETECTED NOT DETECTED Final   Acinetobacter baumannii NOT DETECTED NOT DETECTED Final   Enterobacteriaceae species NOT DETECTED NOT DETECTED Final   Enterobacter cloacae complex NOT DETECTED NOT DETECTED Final   Escherichia coli NOT DETECTED NOT DETECTED Final   Klebsiella oxytoca NOT DETECTED NOT DETECTED Final   Klebsiella pneumoniae NOT DETECTED NOT DETECTED Final   Proteus species NOT DETECTED NOT DETECTED Final   Serratia marcescens NOT DETECTED NOT DETECTED Final   Haemophilus influenzae NOT DETECTED NOT DETECTED Final   Neisseria meningitidis NOT DETECTED NOT DETECTED Final   Pseudomonas aeruginosa NOT DETECTED NOT DETECTED Final   Candida albicans NOT DETECTED NOT DETECTED Final   Candida glabrata NOT DETECTED NOT DETECTED Final   Candida krusei NOT DETECTED NOT DETECTED Final   Candida parapsilosis NOT DETECTED NOT DETECTED Final   Candida tropicalis NOT DETECTED NOT DETECTED Final    Comment: Performed at [facility], [address], [place], [place] [address]  Culture, blood (routine x 2)     Status: None   Collection Time: 12/22/2017  9:10 PM  Result Value Ref Range Status   Specimen Description BLOOD LEFT HAND  Final   Special Requests   Final    BOTTLES DRAWN AEROBIC ONLY Blood  Culture adequate volume   Culture   Final    NO GROWTH 5 DAYS Performed at [facility], [address] [place], [place] [address]    Report Status 12/31/2017 FINAL  Final  MRSA PCR Screening     Status: None   Collection Time: 12/27/17  1:46 AM  Result Value Ref Range Status   MRSA by PCR NEGATIVE NEGATIVE Final    Comment:        The GeneXpert MRSA Assay (FDA approved for NASAL specimens only), is one component of a comprehensive MRSA colonization surveillance program. It is not intended to diagnose MRSA infection nor to guide or monitor treatment for MRSA infections. Performed at [facility], [address], [place], [place] [address]   Culture, blood (Routine X 2) w Reflex to ID Panel     Status: None (Preliminary result)   Collection Time: 12/29/17 11:54 AM  Result Value Ref Range Status   Specimen Description BLOOD BLOOD LEFT HAND  Final   Special Requests   Final    BOTTLES DRAWN AEROBIC ONLY Blood Culture adequate volume   Culture   Final    NO GROWTH 4 DAYS Performed at [facility], [place] [address] [place], [place] [address]    Report Status PENDING  Incomplete  Culture, blood (Routine X 2) w Reflex to ID Panel     Status: None (Preliminary result)   Collection Time: 12/29/17 11:57 AM  Result Value Ref Range Status   Specimen Description BLOOD BLOOD LEFT HAND  Final   Special Requests   Final    BOTTLES DRAWN AEROBIC ONLY Blood Culture adequate volume   Culture   Final    NO GROWTH 4 DAYS Performed at [facility], [place] [address], [place], [place] [address]    Report Status PENDING  Incomplete      Studies: Dg Chest Port 1 View  Result Date: 01/03/2018 CLINICAL DATA:  Respiratory failure EXAM: PORTABLE CHEST 1 VIEW COMPARISON:  01/02/2018 FINDINGS: Interval placement of feeding tube entering the stomach with the tip not visualized. Right arm PICC tip in the SVC has been placed since the prior study Improved aeration of the lung. Improved lung  volume with decrease in bibasilar atelectasis. Negative for edema or effusion IMPRESSION: Interval placement of feeding tube and right arm PICC with the tip in the SVC Improved lung volume with decrease in bibasilar atelectasis. Electronically Signed   By: Franchot Gallo M.D.   On: 01/03/2018 09:01    Scheduled Meds: . Chlorhexidine Gluconate Cloth  6 each Topical Daily  . diltiazem  60 mg Per Tube Q8H  . feeding supplement (PRO-STAT SUGAR FREE 64)  30 mL Per Tube BID  . hydrocortisone sod succinate (SOLU-CORTEF) inj  50 mg Intravenous Q12H  . insulin aspart  0-9 Units Subcutaneous Q4H  . ipratropium  0.5 mg Nebulization BID   And  . levalbuterol  0.63 mg Nebulization BID  . mouth rinse  15 mL Mouth Rinse BID  . methimazole  20 mg Per Tube Q6H  . pantoprazole sodium  40 mg Per Tube Daily  . potassium chloride  40 mEq Per Tube BID  . propranolol  40 mg Per Tube Q4H  . sodium chloride flush  10-40 mL Intracatheter Q12H    Continuous Infusions: . sodium chloride 10 mL/hr at 01/02/18 0600  . feeding supplement (VITAL 1.5 CAL) 1,000 mL (01/03/18 0528)  . fentaNYL infusion INTRAVENOUS Stopped (01/01/18 1027)  . levETIRAcetam Stopped (01/03/18 0429)     LOS: 8 days     Desiree Hane, MD [facility] Pager [phone]  If 7PM-7AM, please contact night-coverage [email or web address] Password [email or web address] 01/03/2018, 11:25 AM

## 2018-01-03 NOTE — Progress Notes (Signed)
SLP Cancellation Note  Patient Details Name: [name] MRN: [id number] DOB: [birth year]   Cancelled treatment:       Reason Eval/Treat Not Completed: Patient's level of consciousness. Pt not alert or following commands. Will continue to follow for readiness to initiate swallowing treatment.  Deneise Lever, [place], [place] Speech-Language Pathologist [place] 01/03/2018, 10:30 AM

## 2018-01-04 ENCOUNTER — Inpatient Hospital Stay (HOSPITAL_COMMUNITY): Payer: Medicare Other

## 2018-01-04 DIAGNOSIS — R0600 Dyspnea, unspecified: Secondary | ICD-10-CM

## 2018-01-04 DIAGNOSIS — Z515 Encounter for palliative care: Secondary | ICD-10-CM

## 2018-01-04 DIAGNOSIS — I272 Pulmonary hypertension, unspecified: Secondary | ICD-10-CM

## 2018-01-04 DIAGNOSIS — N183 Chronic kidney disease, stage 3 (moderate): Secondary | ICD-10-CM

## 2018-01-04 DIAGNOSIS — J9601 Acute respiratory failure with hypoxia: Secondary | ICD-10-CM

## 2018-01-04 DIAGNOSIS — I482 Chronic atrial fibrillation: Secondary | ICD-10-CM

## 2018-01-04 DIAGNOSIS — E0581 Other thyrotoxicosis with thyrotoxic crisis or storm: Secondary | ICD-10-CM

## 2018-01-04 DIAGNOSIS — I4891 Unspecified atrial fibrillation: Secondary | ICD-10-CM

## 2018-01-04 LAB — BLOOD GAS, ARTERIAL
Acid-Base Excess: 6 mmol/L — ABNORMAL HIGH (ref 0.0–2.0)
Bicarbonate: 30 mmol/L — ABNORMAL HIGH (ref 20.0–28.0)
DELIVERY SYSTEMS: POSITIVE
DRAWN BY: 441351
Expiratory PAP: 5
FIO2: 35
INSPIRATORY PAP: 10
O2 Saturation: 98.8 %
Patient temperature: 99.7
pCO2 arterial: 45 mmHg (ref 32.0–48.0)
pH, Arterial: 7.442 (ref 7.350–7.450)
pO2, Arterial: 132 mmHg — ABNORMAL HIGH (ref 83.0–108.0)

## 2018-01-04 LAB — ECHOCARDIOGRAM LIMITED
HEIGHTINCHES: 74 in
WEIGHTICAEL: 3908.31 [oz_av]

## 2018-01-04 LAB — COMPREHENSIVE METABOLIC PANEL
ALK PHOS: 79 U/L (ref 38–126)
ALT: 162 U/L — ABNORMAL HIGH (ref 17–63)
ANION GAP: 10 (ref 5–15)
AST: 47 U/L — ABNORMAL HIGH (ref 15–41)
Albumin: 2.2 g/dL — ABNORMAL LOW (ref 3.5–5.0)
BILIRUBIN TOTAL: 1.4 mg/dL — AB (ref 0.3–1.2)
BUN: 34 mg/dL — ABNORMAL HIGH (ref 6–20)
CALCIUM: 9.4 mg/dL (ref 8.9–10.3)
CO2: 28 mmol/L (ref 22–32)
Chloride: 117 mmol/L — ABNORMAL HIGH (ref 101–111)
Creatinine, Ser: 1.33 mg/dL — ABNORMAL HIGH (ref 0.61–1.24)
GFR calc non Af Amer: 53 mL/min — ABNORMAL LOW (ref >60)
GLUCOSE: 258 mg/dL — AB (ref 65–99)
Potassium: 3.7 mmol/L (ref 3.5–5.1)
Sodium: 155 mmol/L — ABNORMAL HIGH (ref 135–145)
TOTAL PROTEIN: 5.7 g/dL — AB (ref 6.5–8.1)

## 2018-01-04 LAB — BASIC METABOLIC PANEL
Anion gap: 12 (ref 5–15)
BUN: 31 mg/dL — AB (ref 6–20)
CALCIUM: 9.1 mg/dL (ref 8.9–10.3)
CHLORIDE: 105 mmol/L (ref 101–111)
CO2: 25 mmol/L (ref 22–32)
CREATININE: 1.34 mg/dL — AB (ref 0.61–1.24)
GFR calc non Af Amer: 52 mL/min — ABNORMAL LOW (ref >60)
Glucose, Bld: 473 mg/dL — ABNORMAL HIGH (ref 65–99)
Potassium: 3.3 mmol/L — ABNORMAL LOW (ref 3.5–5.1)
Sodium: 142 mmol/L (ref 135–145)

## 2018-01-04 LAB — OSMOLALITY, URINE: OSMOLALITY UR: 743 mosm/kg (ref 300–900)

## 2018-01-04 LAB — MAGNESIUM: Magnesium: 1.8 mg/dL (ref 1.7–2.4)

## 2018-01-04 LAB — GLUCOSE, CAPILLARY
GLUCOSE-CAPILLARY: 193 mg/dL — AB (ref 65–99)
GLUCOSE-CAPILLARY: 265 mg/dL — AB (ref 65–99)
GLUCOSE-CAPILLARY: 282 mg/dL — AB (ref 65–99)
Glucose-Capillary: 328 mg/dL — ABNORMAL HIGH (ref 65–99)
Glucose-Capillary: 292 mg/dL — ABNORMAL HIGH (ref 65–99)
Glucose-Capillary: 288 mg/dL — ABNORMAL HIGH (ref 65–99)

## 2018-01-04 LAB — PROCALCITONIN: Procalcitonin: <0.1 ng/mL

## 2018-01-04 LAB — TROPONIN I: Troponin I: 0.21 ng/mL (ref <0.03)

## 2018-01-04 LAB — PHOSPHORUS: Phosphorus: 3 mg/dL (ref 2.5–4.6)

## 2018-01-04 NOTE — Progress Notes (Signed)
  Echocardiogram 2D Echocardiogram has been performed.  [name] F 01/04/2018, 10:26 AM

## 2018-01-04 NOTE — Progress Notes (Addendum)
NEUROHOSPITALISTS - DAILY PROGRESS NOTE   SUBJECTIVE: No family is at the bedside. Patient is found laying in bed in NAD. CPAP mask in place. No new/acute events reported overnight. No seizure activity or posturing reported  Physical Examination   Vitals:   01/04/18 0532 01/04/18 0600 01/04/18 0700 01/04/18 0808  BP:  110/63 128/84   Pulse:  94 97   Resp:  (!) 29 (!) 29   Temp:    99.4 F (37.4 C)  TempSrc:    Axillary  SpO2: 100% 100%    Weight: 110.8 kg (244 lb 4.3 oz)     Height: 6\' 2"  (1.88 m)      General - well developed, in no apparent distress HEENT-  Normocephalic, Cardiovascular - Regular rate and rhythm  Respiratory - even and unlabored, No wheezing. Abdomen - soft and non-tender, BS normal Extremities- no edema or cyanosis  Neurological Examination  Mental Status: Lethargic, briefly opens eyes, but requires continuous stimulation or quickly falls back to sleep.  Makes no attempt to communicate with speech or hand gestures. Some minimal speech output but difficult to understand with CPAP mask in place. Able to follow simple commands with repeated requests. Performs better on Right vs Left Cranial Nerves: II: Visual Fields are full. Pupils are round, and reactive to light.  Left Pupil 5, Right pupil 3 - unknown if this is a baseline finding III,IV, VI: Does not track examiner. Appears to blink to threat bilaterally. No obvious ptosis or nystagmus.   V: unable to test VII: Facial movement appears symmetric.  VIII: hearing appears intact to voice X: Unable to test XI: BJS:EGBTDV to test Motor: Tone and Bulk appear normal. Spontaneously MAE's but not to command. Moving Right side more than Left this morning. Sensor: withdrawals to pain in all extremities  Laboratory Results  CBC:  Recent Labs  Lab 01/01/18 0453 01/03/18 0311 01/03/18 1446  WBC 9.8 11.4* 13.5*  HGB 8.6* 10.2* 10.5*  HCT 27.6* 34.4* 36.0*  MCV 88.5 89.8 89.3  PLT 238 357 380    BMP: Recent Labs  Lab 12/31/17 0930 12/31/17 1615 01/01/18 0453 01/01/18 1819 01/02/18 0335 01/03/18 0311 01/03/18 1446 01/03/18 1851 01/04/18 0548  NA  --   --  144  --  151* 158* 159* 160* 155*  K  --   --  3.5  --  2.9* 3.5 4.1 4.0 3.7  CL  --   --  106  --  111 121* 120* 122* 117*  CO2  --   --  29  --  29 29 28 29 28   GLUCOSE  --   --  233*  --  217* 278* 318* 322* 258*  BUN  --   --  62*  --  61* 43* 40* 39* 34*  CREATININE  --   --  1.55*  --  1.44* 1.33* 1.41* 1.42* 1.33*  CALCIUM  --   --  9.3  --  10.2 10.3 10.1 10.1 9.4  MG 2.1 1.8 2.3 2.2  --   --   --   --  1.8  PHOS 4.1 3.7 4.8* 3.4  --   --   --   --  3.0   Liver Function Tests:  Recent Labs  Lab 12/31/17 0354 01/03/18 1446 01/04/18 0548  AST 21 48* 47*  ALT 54 165* 162*  ALKPHOS 56 86 79  BILITOT 3.0* 0.9 1.4*  PROT 5.7* 5.8* 5.7*  ALBUMIN 2.4* 2.4* 2.2*   Thyroid  Function Studies:  Recent Labs    01/02/18 0335  TSH <0.010*   Cardiac Enzymes:  Recent Labs  Lab 01/03/18 1446 01/03/18 1851 01/04/18 0217  TROPONINI 0.37* 0.33* 0.21*   Imaging Results  Ct Head Wo Contrast  Result Date: 01/03/2018 CLINICAL DATA:  Subdural hematoma altered consciousness EXAM: CT HEAD WITHOUT CONTRAST TECHNIQUE: Contiguous axial images were obtained from the base of the skull through the vertex without intravenous contrast. COMPARISON:  CT head 12/30/2017 FINDINGS: Brain: Mild right hemispheric subdural hematoma shows interval improvement with decreased density and decreased size. This extends along the falx and tentorium as well as throughout the right hemisphere. No new area of hemorrhage. No midline shift. Generalized atrophy.  No acute infarct or mass. Vascular: Negative for hyperdense vessel Skull: Negative Sinuses/Orbits: Negative Other: None IMPRESSION: Continued improvement in right hemispheric subdural hematoma. No new area of hemorrhage or infarction. Electronically Signed   By: Franchot Gallo M.D.   On:  01/03/2018 14:59   Dg Chest Port 1 View  Result Date: 01/03/2018 CLINICAL DATA:  Respiratory failure EXAM: PORTABLE CHEST 1 VIEW COMPARISON:  01/02/2018 FINDINGS: Interval placement of feeding tube entering the stomach with the tip not visualized. Right arm PICC tip in the SVC has been placed since the prior study Improved aeration of the lung. Improved lung volume with decrease in bibasilar atelectasis. Negative for edema or effusion IMPRESSION: Interval placement of feeding tube and right arm PICC with the tip in the SVC Improved lung volume with decrease in bibasilar atelectasis. Electronically Signed   By: Franchot Gallo M.D.   On: 01/03/2018 09:01   IMPRESSION AND PLAN   Likely worsening metabolic encephalopathy Sodium slowly trending down. Will continue to closely monitor exam.  Outstanding Work-up Studies:     EEG                                                                                   PENDING  01/04/2018 PLAN   SEIZURES: EEG - PENDING No seizure activity or posturing reported overnight Continue Keppra IV and Ativan PRN Maintain Seizure precautions Limit use of Haldol, if possible    Hospital day # 9 FAMILY UPDATES: No family at bedside  TEAM UPDATES: Marshell Garfinkel, MD    Assessment & plan discussed with with attending physician and they are in agreement.    Mary A Costello. ANP-C [facility] 01/04/2018, 8:13 AM  Will continue to follow  01/04/2018:  ATTENDING ASSESSMENT     I have seen the patient and reviewed the note.  He is much more interactive today, though BiPAP prevents much speaking.  At this time, I suspect that his worsening encephalopathy was metabolic in nature (hypernatremia).  No evidence of ongoing seizure on EEG.  No further recommendations at this time other than as in my previous note I would recommend discussing with neurosurgery timing of restarting anticoagulation.  Continue Keppra for at least the intermediate term until  outpatient follow-up.  Please call with further neurological questions or concerns.  Roland Rack, MD [facility] [phone]  If 7pm- 7am, please page neurology on call as listed in [place].

## 2018-01-04 NOTE — Procedures (Signed)
History: 72 yo M with AMS  Sedation: None  Technique: This is a 21 channel routine scalp EEG performed at the bedside with bipolar and monopolar montages arranged in accordance to the international 10/20 system of electrode placement. One channel was dedicated to EKG recording.    Background: The background consists predominantly of intermixed alpha and beta activities.  There is mild generalized irregular delta intrusion into the background.  There is a well defined posterior dominant rhythm of 9 Hz that attenuates with eye opening. Sleep is recorded with normal appearing structures.   Photic stimulation: Physiologic driving is not performed  EEG Abnormalities: 1) mild generalized irregular slow activity  Clinical Interpretation: This EEG is consistent with a mild generalized non-specific cerebral dysfunction(encephalopathy). There was no seizure or seizure predisposition recorded on this study. Please note that a normal EEG does not preclude the possibility of epilepsy.   Roland Rack, MD [facility] [phone]  If 7pm- 7am, please page neurology on call as listed in [place].

## 2018-01-04 NOTE — Progress Notes (Signed)
EEG completed; results pending.    

## 2018-01-04 NOTE — Progress Notes (Signed)
  Echocardiogram 2D Echocardiogram has been performed.  Merrie Roof F 01/04/2018, 10:28 AM

## 2018-01-04 NOTE — Progress Notes (Signed)
PULMONARY / CRITICAL CARE MEDICINE   Name: [name] MRN: [id number] DOB: January 22, 1946    ADMISSION DATE:  12/11/2017 CONSULTATION DATE: 12/30/2017  REFERRING MD:  Karie Kirks  CHIEF COMPLAINT:  Shortness of breath   HISTORY OF PRESENT ILLNESS:   [name] is a 72 yr old male with history of a fib, stage 3 chronic kidney disease, DM2, adenocarcinoma of colon s/p resection and current colostomy, history of renal cell carcinoma s/o resection who was admitted on 12/04/2017 for shortnes s of breath, supratherputic INR,  afib with RVR and pulm edema. During his stay he was diagnosed with thyrotoxicosis and bilateral SDH and seizure. On 4/2 PCCM consulted for increased work of breathing, hypoxia, AMS.  He was intubated and tx to ICU.      Recent eEents Extubated.  Transferred out of ICU on 4/5 Called back on 4/6 as he became acutely dyspneic with tachypnea Chest x-ray is better than before and ABG shows mild alkalosis  4/7 The patient is awake and appears relatively comfortable. He remains on BIPAP. His CXR was fairly clear. ABG shows a pH of 7.44/ PacO2 45/Pa02  132    VITAL SIGNS: BP (!) 158/91   Pulse 93   Temp 99.4 F (37.4 C) (Axillary)   Resp (!) 30   Ht 6\' 2"  (1.88 m)   Wt 244 lb 4.3 oz (110.8 kg)   SpO2 100%   BMI 31.36 kg/m   HEMODYNAMICS:    VENTILATOR SETTINGS: Vent Mode: PCV;BIPAP FiO2 (%):  [35 %-40 %] 35 % Set Rate:  [8 bmp] 8 bmp PEEP:  [5 cmH20] 5 cmH20 Pressure Support:  [10 cmH20] 10 cmH20 Plateau Pressure:  [10 cmH20-14 cmH20] 10 cmH20  INTAKE / OUTPUT: I/O last 3 completed shifts: In: 5955.8 [I.V.:2748.8; Other:50; NG/GT:2707; IV Piggyback:450] Out: 4105 [Urine:2630; Other:450; Stool:1025]  PHYSICAL EXAMINATION: Gen:      No acute distress. [name] on BIPAP HEENT:  EOMI, sclera anicteric Neck:     No masses; no thyromegaly Lungs:    Clear to auscultation bilaterally; normal respiratory effort CV:         Regular rate and rhythm; no  murmurs Abd:      Obese,+ bowel sounds; soft, non-tender; no palpable masses, no distension Ext:    No edema; adequate peripheral perfusion Skin:      Warm and dry; no rash Neuro: Somnolent, opens eyes to painful stimuli. Will not respond appropriatelt to my questions or commands presently  LABS:  BMET Recent Labs  Lab 01/03/18 1446 01/03/18 1851 01/04/18 0548  NA 159* 160* 155*  K 4.1 4.0 3.7  CL 120* 122* 117*  CO2 28 29 28   BUN 40* 39* 34*  CREATININE 1.41* 1.42* 1.33*  GLUCOSE 318* 322* 258*    Electrolytes Recent Labs  Lab 01/01/18 0453 01/01/18 1819  01/03/18 1446 01/03/18 1851 01/04/18 0548  CALCIUM 9.3  --    < > 10.1 10.1 9.4  MG 2.3 2.2  --   --   --  1.8  PHOS 4.8* 3.4  --   --   --  3.0   < > = values in this interval not displayed.    CBC Recent Labs  Lab 01/01/18 0453 01/03/18 0311 01/03/18 1446  WBC 9.8 11.4* 13.5*  HGB 8.6* 10.2* 10.5*  HCT 27.6* 34.4* 36.0*  PLT 238 357 380    Coag's Recent Labs  Lab 12/29/17 1838 12/30/17 0506 12/31/17 0354  INR 1.29 1.34 1.36  Sepsis Markers Recent Labs  Lab 12/30/17 1942  01/02/18 0335 01/03/18 1446 01/03/18 1851 01/04/18 0548  LATICACIDVEN 1.0  --   --  1.9 1.8  --   PROCALCITON  --    < > <0.10 <0.10  --  <0.10   < > = values in this interval not displayed.    ABG Recent Labs  Lab 12/30/17 1459 01/03/18 1248 01/04/18 0350  PHART 7.509* 7.459* 7.442  PCO2ART 40.0 44.1 45.0  PO2ART 176* 84.3 132*    Liver Enzymes Recent Labs  Lab 12/31/17 0354 01/03/18 1446 01/04/18 0548  AST 21 48* 47*  ALT 54 165* 162*  ALKPHOS 56 86 79  BILITOT 3.0* 0.9 1.4*  ALBUMIN 2.4* 2.4* 2.2*    Cardiac Enzymes Recent Labs  Lab 01/03/18 1446 01/03/18 1851 01/04/18 0217  TROPONINI 0.37* 0.33* 0.21*    Glucose Recent Labs  Lab 01/03/18 1153 01/03/18 1553 01/03/18 2047 01/03/18 2326 01/04/18 0402 01/04/18 0805  GLUCAP 302* 296* 314* 300* 193* 265*    Imaging Ct Head Wo  Contrast  Result Date: 01/03/2018 CLINICAL DATA:  Subdural hematoma altered consciousness EXAM: CT HEAD WITHOUT CONTRAST TECHNIQUE: Contiguous axial images were obtained from the base of the skull through the vertex without intravenous contrast. COMPARISON:  CT head 12/30/2017 FINDINGS: Brain: Mild right hemispheric subdural hematoma shows interval improvement with decreased density and decreased size. This extends along the falx and tentorium as well as throughout the right hemisphere. No new area of hemorrhage. No midline shift. Generalized atrophy.  No acute infarct or mass. Vascular: Negative for hyperdense vessel Skull: Negative Sinuses/Orbits: Negative Other: None IMPRESSION: Continued improvement in right hemispheric subdural hematoma. No new area of hemorrhage or infarction. Electronically Signed   By: Franchot Gallo M.D.   On: 01/03/2018 14:59   Dg Chest Port 1 View  Result Date: 01/04/2018 CLINICAL DATA:  72 year old male with acute respiratory failure EXAM: PORTABLE CHEST 1 VIEW COMPARISON:  Prior chest x-ray 01/03/2018 FINDINGS: Stable position of right upper extremity approach PICC with the catheter tip overlying the mid SVC. The enteric feeding tube is partially imaged. The tip lies below the diaphragm. Stable cardiomegaly and mediastinal contours. Atherosclerotic calcifications again noted in the transverse aorta. No change in the appearance of the lungs with minimal bibasilar atelectasis. No new airspace opacification, pleural effusion, pulmonary edema or pneumothorax. No acute osseous abnormality. IMPRESSION: 1. Stable and satisfactory support apparatus. 2. Mild bibasilar atelectasis. Electronically Signed   By: Jacqulynn Cadet M.D.   On: 01/04/2018 08:55   STUDIES:  CT head 12/28/2017 >>1. Bilateral subdural hematomas, right larger than left. Focal collection of blood in the right frontoparietal region with hematocrit level, ongoing active bleeding not excluded. Short interval follow-up  recommended. 2. Minimal mass effect without significant midline shift. MRI brian 4/1>>> 1. Stable right greater than left subdural hematomas since the head CT yesterday.  The right side subdural demonstrates greater lobulation and signal heterogeneity measuring between 8 and 17 mm in thickness. The smaller more homogeneous left side subdural measures 6-7 mm in thickness. 2. Stable mild intracranial mass effect with trace leftward midline shift. Normal basilar cisterns. Trace intraventricular hemorrhage with no ventriculomegaly. 3. No superimposed acute infarct. CT head 4/2>>> 1. Stable right larger than left mixed attenuation subdural hematomas. Stable minimal associated mass effect. No herniation. 2. No new acute intracranial abnormality identified. US thyroid 4/4>>> 1.5 cm right mid thyroid TR 4 nodule meets criteria for biopsy as Above.  1.4 cm left inferior TR  3 nodule does not meet criteria for any biopsy or follow-up.   Nonspecific gland heterogeneity.  CULTURES: BC x 2 3/29>>> 1/2 coag neg staph>> suspect contaminant  BC x 2 4/1>>> neg   LINES/TUBES:  ETT 4/2>> 4/4 RUA PICC 3/30>>>  ANTIBIOTICS: Zosyn 3/30>> 4/5, restart 4/6 Vanc 3/31>>>4/3  SIGNIFICANT EVENTS: 12/30/2017 transfer to ICU for worsening resp failure and mental status with afib RVR  01/01/2018>> Extubated  4/5 >> Transfer back to ICU with similar presentation  ASSESSMENT / PLAN: Altered Mental Status It remains a little unclear what caused his recent worsening mental status. No sign of new pneunmonia His CT head of 4/6 appeared to show improvement in the right subdural hematoma.  Procal is <01. T4 free is decreased from a few days ago. His sodium went up to 160 but now appears to be trending down at 155. There has been no sign of an overt seizure. May need to consider repeat EEG.  PULMONARY I don't believe the patient had a PE. His 02 needs are fairly minimal. I would be concerned about performoing CTA  with his kidney duyusfn.  Close monitoring for re intubation need Bilaterl lower leg dopplers have been ordered.   CARDIOVASCULAR  BP is stable. the patient is in a NSR.  HTN emergency Acute diastolic CHF pulm edema  Atrial fibrillation with RVR  2D echo 3//30>> EF -65%, mod hypertrophy, trivial Dwayne, trivial TR, severely dilated RA and LA  Continue Cardizem. IV lopressor Propranolol PO Cardiology following.   RENAL  No change in overall renal fn. P:   Trend  chem  Start D5W, Hold free water  GASTROINTESTINAL  We are holding po meds and feeds at this time  HEMATOLOGIC A:   Leukocytosis most likely reactive and improving Anemia - mild   As noted Procal was negative. He did have temp to 100.9 at 17 :30 on 4/6. 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 Acute metabolic encephalopathy Bilateral SDH - stable. Discussed with neurology. They don't believe the altered mental status is from a primary neurological issue at this time. Seizure?. Neuro is obtaining a new EEG today.  P:   RASS goal -1  Minimize sedation. CT head Continue keppra  Hold all anticoagulation  Correct thyroid storm as above Neuro following.  Critical care time 35 minutes

## 2018-01-04 NOTE — Progress Notes (Signed)
Patient is resting comfortably on 3L [place] with no respiratory distress noted. BIPAP is not needed at this time. RN is aware.  RT will monitor as needed.

## 2018-01-04 NOTE — Consult Note (Signed)
Consultation Note Date: 01/04/2018   Patient Name: [name]  DOB: 06-06-1946  MRN: [id number]  Age / Sex: 72 y.o., male  PCP: Lemmie Evens, MD Referring Physician: Marshell Garfinkel, MD  Reason for Consultation: Establishing goals of care and Psychosocial/spiritual support  HPI/Patient Profile: 72 y.o. male  with past medical history of diabetes type 2, chronic kidney disease stage III history of colon cancer with colostomy in 2010, renal cell carcinoma with right nephrectomy 2010 admitted on 12/22/2017 with weakness, shortness of breath, abnormal INR.  He was initially seen at [facility] and transferred to [facility] for higher level of care.  Due to worsening shortness of breath, patient was intubated on 12/30/2017 then extubated on 01/02/2018.  On 01/03/2018 patient developed acute onset of shortness of breath again and was transferred to ICU and placed on BiPAP.    Consult ordered for goals of care and for consult ordered at wife's request.   Clinical Assessment and Goals of Care: Chart reviewed, patient seen.  Met with patient's wife [name].  [name] was very pleasant but she was clear that she had not requested this consult.  She states "were not ready for that yet".  I did explain to her that palliative medicine is not hospice in case that was her concern as well as introduced our services as an additional resource and source of support not only for her husband but for herself.  Defined palliative care as  specialized medical care for people with serious illness, appropriate at any age or stage of illness; palliative medicine works with patient's and family's and medical providers in conjunction with aggressive medical treatment as well  Patient at this point is unable to speak for himself.  His healthcare proxy would be his wife, [name], [phone]  [name] shares with me some of her husband's  history as he has gone through colon cancer with a colostomy in 2010 as well as renal cell carcinoma with right nephrectomy in 2010.  She describes him as going through this well and  him being compliant with annual oncology appointments.  Patient's oncologist is in [place] and associated with [facility] but she was unable to provide providers name.  She states after his cancer, which happened at the age of 30, he was unable to gain employment and subsequently went out on disability.  Since that time he is done well.  She states in February he was going about his daily activities as usual which were cooking all the meals, able to cut the grass, driving, going to the grocery shopping.  He was not short of breath or weak.  One month prior to admission is when he first developed the symptoms and he initially went to PCP  for treatment of shortness of breath.  She states he was prescribed Symbicort with no benefit.  He also has been so profoundly weak that he was unable to make doctor's appointments such as returning to the Coumadin clinic.  Due to his weakness, patient fell out of  the bed and hit his head on the nightstand approximately 2 weeks prior to admission.  In addition to profound weakness shortness of breath he is also had loss of appetite  In terms of advance directives, she states she and her husband have talked about these things especially when he got his cancer diagnosis.  She views this is a little different scenario and that she still does not have a clear understanding about what is causing the severity of his illness.  She is hopeful that he will be able to talk to her and clarify some of these issues should his clinical condition worsen    SUMMARY OF RECOMMENDATIONS   Continue with full code, full scope of treatment as it appears as though we are still in the process of medical workup. I am not sure how palliative medicine can assist at this point.  We are happy to do so, but wife states at  this point she has no further needs Please feel free to reconsult [place] should palliative needs become evident Code Status/Advance Care Planning:  Full code   Palliative Prophylaxis:   Aspiration, Bowel Regimen, Delirium Protocol, Eye Care, Frequent Pain Assessment, Oral Care and Turn Reposition  Additional Recommendations (Limitations, Scope, Preferences):  Full Scope Treatment  Psycho-social/Spiritual:   Desire for further Chaplaincy support:no   Prognosis:   Unable to determine  Discharge Planning: To Be Determined      Primary Diagnoses: Present on Admission: . Atrial fibrillation with RVR ([place]) . Hypertension . Stage 3 chronic kidney disease ([place]) . Supratherapeutic INR . Heme positive stool . Acute systolic CHF (congestive heart failure) ([place])   I have reviewed the medical record, interviewed the patient and family, and examined the patient. The following aspects are pertinent.  Past Medical History:  Diagnosis Date  . Atrial fibrillation ([place])   . Cancer ([place])    colon 2010  . Chronic anticoagulation   . Diabetes mellitus without complication ([place])   . Hypertension    Social History   Socioeconomic History  . Marital status: Married    Spouse name: Not on file  . Number of children: Not on file  . Years of education: Not on file  . Highest education level: Not on file  Occupational History  . Not on file  Social Needs  . Financial resource strain: Not on file  . Food insecurity:    Worry: Not on file    Inability: Not on file  . Transportation needs:    Medical: Not on file    Non-medical: Not on file  Tobacco Use  . Smoking status: Former Research scientist (life sciences)  . Smokeless tobacco: Never Used  Substance and Sexual Activity  . Alcohol use: Yes    Comment: occ  . Drug use: Never  . Sexual activity: Not on file  Lifestyle  . Physical activity:    Days per week: Not on file    Minutes per session: Not on file  . Stress: Not on file  Relationships  .  Social connections:    Talks on phone: Not on file    Gets together: Not on file    Attends religious service: Not on file    Active member of club or organization: Not on file    Attends meetings of clubs or organizations: Not on file    Relationship status: Not on file  Other Topics Concern  . Not on file  Social History Narrative  . Not on file   Family History  Problem Relation Age of Onset  . Hypertension Mother   . Hypertension Father    Scheduled Meds: . Chlorhexidine Gluconate Cloth  6 each Topical Daily  . diltiazem  60 mg Per Tube Q8H  . feeding supplement (PRO-STAT SUGAR FREE 64)  30 mL Per Tube BID  . free water  400 mL Per Tube Q6H  . hydrocortisone sod succinate (SOLU-CORTEF) inj  50 mg Intravenous Q12H  . insulin aspart  0-20 Units Subcutaneous Q4H  . insulin glargine  6 Units Subcutaneous Daily  . ipratropium  0.5 mg Nebulization BID   And  . levalbuterol  0.63 mg Nebulization BID  . mouth rinse  15 mL Mouth Rinse BID  . methimazole  20 mg Per Tube Q6H  . pantoprazole sodium  40 mg Per Tube Daily  . potassium chloride  40 mEq Per Tube BID  . propranolol  40 mg Per Tube Q4H  . sodium chloride flush  10-40 mL Intracatheter Q12H   Continuous Infusions: . sodium chloride 10 mL/hr at 01/04/18 1200  . dextrose 150 mL/hr at 01/04/18 1202  . feeding supplement (VITAL 1.5 CAL) Stopped (01/03/18 1440)  . levETIRAcetam Stopped (01/04/18 0427)  . piperacillin-tazobactam (ZOSYN)  IV Stopped (01/04/18 0914)   PRN Meds:.[DISCONTINUED] acetaminophen **OR** acetaminophen, haloperidol lactate, hydrALAZINE, levalbuterol, LORazepam, ondansetron **OR** ondansetron (ZOFRAN) IV, sodium chloride flush Medications Prior to Admission:  Prior to Admission medications   Medication Sig Start Date End Date Taking? Authorizing Provider  ANORO ELLIPTA 62.5-25 MCG/INH AEPB TAKE 1 PUFF BY MOUTH EVERY DAY 11/27/17  Yes [provider]  CALCIUM PO Take 200 mg by mouth daily.    Yes  [provider]  Cholecalciferol 1000 units tablet Take 1,000 Units by mouth daily.   Yes [provider]  dicyclomine (BENTYL) 20 MG tablet Take 20 mg by mouth 3 (three) times daily as needed for spasms.  10/25/17  Yes [provider]  diltiazem (CARDIZEM) 60 MG tablet Take 60 mg by mouth 4 (four) times daily.    Yes [provider]  DULoxetine (CYMBALTA) 20 MG capsule Take 20 mg by mouth daily.   Yes [provider]  gabapentin (NEURONTIN) 300 MG capsule Take 300 mg by mouth daily.  11/06/17  Yes [provider]  HYDROcodone-acetaminophen (NORCO) 10-325 MG tablet Take 1 tablet by mouth every 6 (six) hours as needed for moderate pain.  12/08/17  Yes [provider]  IRON PO Take 55 mg by mouth daily.    Yes [provider]  LYRICA 100 MG capsule Take 100 mg by mouth 3 (three) times daily.  11/08/17  Yes [provider]  metFORMIN (GLUCOPHAGE) 1000 MG tablet Take 1,000 mg by mouth 2 (two) times daily.    Yes [provider]  Omega-3 Fatty Acids (FISH OIL PO) Take 1,000 mg by mouth daily.    Yes [provider]  pioglitazone (ACTOS) 45 MG tablet Take 45 mg by mouth daily.    Yes [provider]  Probiotic Product (PROBIOTIC PO) Take 1 tablet by mouth daily.    Yes [provider]  simvastatin (ZOCOR) 80 MG tablet Take 40 mg by mouth daily.    Yes [provider]  warfarin (COUMADIN) 5 MG tablet Take 5-10 tablets by mouth as directed. 10 mg on M W TH FRI 5 mg TUES SAT   Yes [provider]   No Known Allergies Review of Systems  Unable to perform ROS: Acuity of condition  Physical Exam  Constitutional: He appears well-developed and well-nourished.  Acutely ill-appearing older man seen in ICU; he is in restraints and on BiPAP  Cardiovascular:  Tachycardic  Pulmonary/Chest:  Patient wearing BiPAP  Abdominal: Soft.  Neurological:  Patient will open eyes to  voice; moving all extremities Unable to test orientation  Psychiatric:  Psychomotor restlessness otherwise unable to test  Nursing note and vitals reviewed.   Vital Signs: BP (!) 149/90   Pulse 100   Temp 99 F (37.2 C) (Axillary)   Resp (!) 28   Ht '6\' 2"'$  (1.88 m)   Wt 110.8 kg (244 lb 4.3 oz)   SpO2 100%   BMI 31.36 kg/m  Pain Scale: CPOT POSS *See Group Information*: S-Acceptable,Sleep, easy to arouse Pain Score: 0-No pain   SpO2: SpO2: 100 % O2 Device:SpO2: 100 % O2 Flow Rate: .O2 Flow Rate (L/min): 2 L/min  IO: Intake/output summary:   Intake/Output Summary (Last 24 hours) at 01/04/2018 1335 Last data filed at 01/04/2018 1200 Gross per 24 hour  Intake 4816.83 ml  Output 3105 ml  Net 1711.83 ml    LBM: Last BM Date: 01/03/18 Baseline Weight: Weight: 129.7 kg (286 lb) Most recent weight: Weight: 110.8 kg (244 lb 4.3 oz)     Palliative Assessment/Data:   Flowsheet Rows     Most Recent Value  Intake Tab  Referral Department  Hospitalist  Unit at Time of Referral  ICU  Palliative Care Primary Diagnosis  Pulmonary  Date Notified  01/03/18  Palliative Care Type  New Palliative care  Reason for referral  Clarify Goals of Care, Psychosocial or Spiritual support  Date of Admission  12/21/2017  Date first seen by Palliative Care  01/04/18  # of days Palliative referral response time  1 Day(s)  # of days IP prior to Palliative referral  8  Clinical Assessment  Palliative Performance Scale Score  30%  Pain Max last 24 hours  Not able to report  Pain Min Last 24 hours  Not able to report  Dyspnea Max Last 24 Hours  Not able to report  Dyspnea Min Last 24 hours  Not able to report  Nausea Max Last 24 Hours  Not able to report  Nausea Min Last 24 Hours  Not able to report  Anxiety Max Last 24 Hours  Not able to report  Anxiety Min Last 24 Hours  Not able to report  Other Max Last 24 Hours  Not able to report  Psychosocial & Spiritual Assessment  Palliative Care  Outcomes  Patient/Family meeting held?  Yes  Who was at the meeting?  wife  Palliative Care follow-up planned  Yes, Facility      Time In: 1300 Time Out: 1354 Time Total: 54 min Greater than 50%  of this time was spent counseling and coordinating care related to the above assessment and plan.  Signed by: Dory Horn, NP   Please contact Palliative Medicine Team phone at [phone] for questions and concerns.  For individual provider: See Shea Evans

## 2018-01-04 NOTE — Progress Notes (Signed)
Pt noted to be trying to pull BiPAP mask off. When nurse went to restrap mask, patient started swinging arms at nurse.MD on floor notified. Bilateral wrist restraints ordered and applied. Will continue to monitor. Lianne Bushy RN BSN.

## 2018-01-04 NOTE — Progress Notes (Signed)
Patient remain on Servo-I NIV at this time. No distress noted. Patient resting comfortably on documented settings VSS. Will continue to monitor.

## 2018-01-05 LAB — GLUCOSE, CAPILLARY
GLUCOSE-CAPILLARY: 186 mg/dL — AB (ref 65–99)
GLUCOSE-CAPILLARY: 251 mg/dL — AB (ref 65–99)
Glucose-Capillary: 234 mg/dL — ABNORMAL HIGH (ref 65–99)
Glucose-Capillary: 234 mg/dL — ABNORMAL HIGH (ref 65–99)
Glucose-Capillary: 240 mg/dL — ABNORMAL HIGH (ref 65–99)
Glucose-Capillary: 288 mg/dL — ABNORMAL HIGH (ref 65–99)

## 2018-01-05 LAB — BASIC METABOLIC PANEL
Anion gap: 13 (ref 5–15)
Anion gap: 7 (ref 5–15)
BUN: 24 mg/dL — AB (ref 6–20)
BUN: 25 mg/dL — ABNORMAL HIGH (ref 6–20)
CHLORIDE: 102 mmol/L (ref 101–111)
CHLORIDE: 107 mmol/L (ref 101–111)
CO2: 25 mmol/L (ref 22–32)
CO2: 27 mmol/L (ref 22–32)
CREATININE: 1.31 mg/dL — AB (ref 0.61–1.24)
CREATININE: 1.27 mg/dL — AB (ref 0.61–1.24)
Calcium: 9.6 mg/dL (ref 8.9–10.3)
Calcium: 9.5 mg/dL (ref 8.9–10.3)
GFR calc Af Amer: >60 mL/min (ref >60)
GFR calc Af Amer: >60 mL/min (ref >60)
GFR calc non Af Amer: 56 mL/min — ABNORMAL LOW (ref >60)
GFR, EST NON AFRICAN AMERICAN: 54 mL/min — AB (ref >60)
GLUCOSE: 277 mg/dL — AB (ref 65–99)
GLUCOSE: 269 mg/dL — AB (ref 65–99)
POTASSIUM: 3.7 mmol/L (ref 3.5–5.1)
POTASSIUM: 3.6 mmol/L (ref 3.5–5.1)
Sodium: 140 mmol/L (ref 135–145)
Sodium: 141 mmol/L (ref 135–145)

## 2018-01-05 LAB — PROCALCITONIN: Procalcitonin: <0.1 ng/mL

## 2018-01-05 LAB — T4, FREE: FREE T4: 1.88 ng/dL — AB (ref 0.61–1.12)

## 2018-01-05 LAB — TSH: TSH: <0.01 u[IU]/mL — ABNORMAL LOW (ref 0.350–4.500)

## 2018-01-05 MED ORDER — HYDROCORTISONE NA SUCCINATE PF 100 MG IJ SOLR
50.0000 mg | Freq: Every day | INTRAMUSCULAR | Status: DC
Start: 2018-01-06 — End: 2018-01-07
  Administered 2018-01-06 – 2018-01-07 (×2): 50 mg via INTRAVENOUS
  Filled 2018-01-05 (×3): qty 2

## 2018-01-05 MED ORDER — PROPRANOLOL HCL 20 MG PO TABS
20.0000 mg | ORAL_TABLET | Freq: Four times a day (QID) | ORAL | Status: DC
  Administered 2018-01-05 – 2018-01-07 (×5): 20 mg via ORAL
  Filled 2018-01-05 (×9): qty 1

## 2018-01-05 NOTE — Progress Notes (Signed)
Subjective:  Not interactive   Objective:  Vitals:   01/05/18 0600 01/05/18 0700 01/05/18 0747 01/05/18 0800  BP: 125/62 (!) 117/57  135/69  Pulse: 97 96  93  Resp: (!) 34 (!) 30  (!) 31  Temp:      TempSrc:      SpO2: 99% 99% 99% 100%  Weight: 242 lb 1 oz (109.8 kg)     Height: _0  (1.88 m)       Intake/Output from previous day:  Intake/Output Summary (Last 24 hours) at 01/05/2018 0854 Last data filed at 01/05/2018 0700 Gross per 24 hour  Intake 4440 ml  Output 3850 ml  Net 590 ml    Physical Exam: Not interactive  Chronically ill white male  HEENT: feeding tube RUE PICC line  Neck supple with no adenopathy JVP normal no bruits no thyromegaly Lungs clear with no wheezing and good diaphragmatic motion Heart:  S1/S2 SEM  murmur, no rub, gallop or click PMI normal Abdomen: colostomy post right nephrectomy  Distal pulses intact with no bruits No edema Neuro non-focal Skin warm and dry No muscular weakness   Lab Results: Basic Metabolic Panel: Recent Labs    01/04/18 0548 01/04/18 1746  NA 155* 142  K 3.7 3.3*  CL 117* 105  CO2 28 25  GLUCOSE 258* 473*  BUN 34* 31*  CREATININE 1.33* 1.34*  CALCIUM 9.4 9.1  MG 1.8  --   PHOS 3.0  --    Liver Function Tests: Recent Labs    01/03/18 1446 01/04/18 0548  AST 48* 47*  ALT 165* 162*  ALKPHOS 86 79  BILITOT 0.9 1.4*  PROT 5.8* 5.7*  ALBUMIN 2.4* 2.2*   No results for input(s): LIPASE, AMYLASE in the last 72 hours. CBC: Recent Labs    01/03/18 0311 01/03/18 1446  WBC 11.4* 13.5*  NEUTROABS  --  10.4*  HGB 10.2* 10.5*  HCT 34.4* 36.0*  MCV 89.8 89.3  PLT 357 380   Cardiac Enzymes: Recent Labs    01/03/18 1446 01/03/18 1851 01/04/18 0217  TROPONINI 0.37* 0.33* 0.21*    Recent Labs    01/05/18 0529  TSH <0.010*    Imaging: Ct Head Wo Contrast  Result Date: 01/03/2018 CLINICAL DATA:  Subdural hematoma altered consciousness EXAM: CT HEAD WITHOUT CONTRAST TECHNIQUE: Contiguous  axial images were obtained from the base of the skull through the vertex without intravenous contrast. COMPARISON:  CT head 12/30/2017 FINDINGS: Brain: Mild right hemispheric subdural hematoma shows interval improvement with decreased density and decreased size. This extends along the falx and tentorium as well as throughout the right hemisphere. No new area of hemorrhage. No midline shift. Generalized atrophy.  No acute infarct or mass. Vascular: Negative for hyperdense vessel Skull: Negative Sinuses/Orbits: Negative Other: None IMPRESSION: Continued improvement in right hemispheric subdural hematoma. No new area of hemorrhage or infarction. Electronically Signed   By: Franchot Gallo M.D.   On: 01/03/2018 14:59   Dg Chest Port 1 View  Result Date: 01/04/2018 CLINICAL DATA:  72 year old male with acute respiratory failure EXAM: PORTABLE CHEST 1 VIEW COMPARISON:  Prior chest x-ray 01/03/2018 FINDINGS: Stable position of right upper extremity approach PICC with the catheter tip overlying the mid SVC. The enteric feeding tube is partially imaged. The tip lies below the diaphragm. Stable cardiomegaly and mediastinal contours. Atherosclerotic calcifications again noted in the transverse aorta. No change in the appearance of the lungs with minimal bibasilar atelectasis. No new airspace opacification, pleural  effusion, pulmonary edema or pneumothorax. No acute osseous abnormality. IMPRESSION: 1. Stable and satisfactory support apparatus. 2. Mild bibasilar atelectasis. Electronically Signed   By: Jacqulynn Cadet M.D.   On: 01/04/2018 08:55    Cardiac Studies:  ECG:    Telemetry: afib rates 80-110 01/05/2018   Echo: 01/04/18  EF 65-70% severe LAE reveiwed   Medications:   . Chlorhexidine Gluconate Cloth  6 each Topical Daily  . diltiazem  60 mg Per Tube Q8H  . feeding supplement (PRO-STAT SUGAR FREE 64)  30 mL Per Tube BID  . free water  400 mL Per Tube Q6H  . hydrocortisone sod succinate (SOLU-CORTEF) inj   50 mg Intravenous Q12H  . insulin aspart  0-20 Units Subcutaneous Q4H  . insulin glargine  6 Units Subcutaneous Daily  . ipratropium  0.5 mg Nebulization BID   And  . levalbuterol  0.63 mg Nebulization BID  . mouth rinse  15 mL Mouth Rinse BID  . methimazole  20 mg Per Tube Q6H  . pantoprazole sodium  40 mg Per Tube Daily  . potassium chloride  40 mEq Per Tube BID  . propranolol  40 mg Per Tube Q4H  . sodium chloride flush  10-40 mL Intracatheter Q12H     . sodium chloride 10 mL/hr at 01/05/18 0700  . dextrose 150 mL/hr at 01/05/18 0852  . feeding supplement (VITAL 1.5 CAL) Stopped (01/03/18 1440)  . levETIRAcetam Stopped (01/05/18 0518)  . piperacillin-tazobactam (ZOSYN)  IV 3.375 g (01/05/18 0525)    Assessment/Plan:   1. Atrial Fibrillation: no anticoagulation due to SDH. Continue cardizem for rate control  Made worse by hyperthyroidism see below  2. Hyperthyroidism on propanolol and methimazole f/u labs in 6 weeks  3. SDH:  Bilateral with seizurs anticoagulation held on solucortef no anti seizure meds 4. Pulmonary: sats ok aspiration risk on Zosyn.   Wife met with palliative care and did not seem to think this was right avenue to explore  Total time spent examining patient chart review including labs, x-rays telemetry Echo and decision regarding rate control and anticoagulation for PAF 35 minutes   Jenkins Rouge 01/05/2018, 8:54 AM

## 2018-01-05 NOTE — Progress Notes (Signed)
Patient pulled out Cortrak feeding tube most of the way. It was unwitnessed. Tube feeding turned off as soon as I was aware.

## 2018-01-05 NOTE — Progress Notes (Signed)
  Speech Language Pathology Treatment: Dysphagia  Patient Details Name: [name] MRN: [id number] DOB: [birth year] Today's Date: 01/05/2018 Time: 3557-3220 SLP Time Calculation (min) (ACUTE ONLY): 11 min  Assessment / Plan / Recommendation Clinical Impression  Pt minimally responsive to verbal cues, occasional nods yes or grunts yes. Pt opened eyes and responded to oral care with mouth opening. Opened mouth and eyes with verbal tactile cue for ice chip but did not orally manipulate chip or trigger a swallow, removed with suction. Pt not ready for more extensive trials or testing. Continue NPO until mental status at least improves. Will follow for readiness.   HPI HPI: [name] is a 72 y.o. male with a history of a fib, stage 3 chron c kidney disease, DM2, adenocarcinoma of colon s/p resection and current colostomy, history of renal cell carcinoma s/o resection.  Patient presents with worsening shortness of breath over the past month with dyspnea that worsens with minimal exertion. MRI stable right greater than left subdural hematomas since the head CT The right side subdural demonstrates greater lobulation and signal heterogeneity measuring between 8 and 17 mm in thickness. The Stable mild intracranial mass effect with trace leftward midline shift. Normal basilar cisterns. Trace intraventricular hemorrhage with no ventriculomegaly. MBS recommended after BSE.      SLP Plan  Continue with current plan of care       Recommendations  Diet recommendations: NPO                Oral Care Recommendations: Oral care QID Follow up Recommendations: Inpatient Rehab SLP Visit Diagnosis: Dysphagia, oropharyngeal phase (R13.12) Plan: Continue with current plan of care       GO                Katlin Ciszewski, Katherene Ponto 01/05/2018, 11:30 AM

## 2018-01-05 NOTE — Progress Notes (Signed)
PULMONARY / CRITICAL CARE MEDICINE   Name: [name] MRN: [id number] DOB: 1945/10/14    ADMISSION DATE:  12/08/2017 CONSULTATION DATE: 12/30/2017  REFERRING MD:  [name]  CHIEF COMPLAINT:  Shortness of breath   HISTORY OF PRESENT ILLNESS:   [name] is a 72 yr old male with history of a fib, stage 3 chronic kidney disease, DM2, adenocarcinoma of colon s/p resection and current colostomy, history of renal cell carcinoma s/o resection who was admitted on 12/13/2017 for shortnes s of breath, supratherputic INR,  afib with RVR and pulm edema. During his stay he was diagnosed with thyrotoxicosis and bilateral SDH and seizure. On 4/2 PCCM consulted for increased work of breathing, hypoxia, AMS.  He was intubated and tx to ICU.      Recent Events Extubated.  Transferred out of ICU on 4/5 Called back on 4/6 as he became acutely dyspneic with tachypnea Chest x-ray is better than before and ABG shows mild alkalosis  4/7 The patient is awake and appears relatively comfortable. He remains on BIPAP. His CXR was fairly clear. ABG shows a pH of 7.44/ PacO2 45/Pa02  132  4/8 Patient opens eyes but noit trying to speak this AM. He is on  ansal cannula and maintaining his 02 sats.   VITAL SIGNS: BP 138/67   Pulse (!) 101   Temp 99.3 F (37.4 C) (Axillary)   Resp (!) 32   Ht 6\' 2"  (1.88 m)   Wt 242 lb 1 oz (109.8 kg)   SpO2 98%   BMI 31.08 kg/m         INTAKE / OUTPUT: I/O last 3 completed shifts: In: 7280 [I.V.:5930; NG/GT:800; IV Piggyback:550] Out: 0109 [Urine:4500; Stool:1255]  PHYSICAL EXAMINATION: Gen:      No acute distress. [name] on BIPAP HEENT:  EOMI, sclera anicteric. NGT in left nare Neck:     No masses; no thyromegaly Lungs:    Clear to auscultation bilaterally; normal respiratory effort CV:         Regular rate and rhythm; no murmurs Abd:      Obese,+ bowel sounds; soft, non-tender; no palpable masses, no distension Ext:    No edema; adequate  peripheral perfusion Skin:      Warm and dry; no rash Neuro: Somnolent, opens eyes to painful stimuli. Will not respond appropriatelt to my questions or commands presently. Mnetal status not radically different from yesterday  LABS:  BMET Recent Labs  Lab 01/03/18 1851 01/04/18 0548 01/04/18 1746  NA 160* 155* 142  K 4.0 3.7 3.3*  CL 122* 117* 105  CO2 29 28 25   BUN 39* 34* 31*  CREATININE 1.42* 1.33* 1.34*  GLUCOSE 322* 258* 473*    Electrolytes Recent Labs  Lab 01/01/18 0453 01/01/18 1819  01/03/18 1851 01/04/18 0548 01/04/18 1746  CALCIUM 9.3  --    < > 10.1 9.4 9.1  MG 2.3 2.2  --   --  1.8  --   PHOS 4.8* 3.4  --   --  3.0  --    < > = values in this interval not displayed.    CBC Recent Labs  Lab 01/01/18 0453 01/03/18 0311 01/03/18 1446  WBC 9.8 11.4* 13.5*  HGB 8.6* 10.2* 10.5*  HCT 27.6* 34.4* 36.0*  PLT 238 357 380    Coag's Recent Labs  Lab 12/29/17 1838 12/30/17 0506 12/31/17 0354  INR 1.29 1.34 1.36    Sepsis Markers Recent Labs  Lab 12/30/17 1942  01/03/18 1446 01/03/18 1851 01/04/18 0548 01/05/18 0529  LATICACIDVEN 1.0  --  1.9 1.8  --   --   PROCALCITON  --    < > <0.10  --  <0.10 <0.10   < > = values in this interval not displayed.    ABG Recent Labs  Lab 12/30/17 1459 01/03/18 1248 01/04/18 0350  PHART 7.509* 7.459* 7.442  PCO2ART 40.0 44.1 45.0  PO2ART 176* 84.3 132*    Liver Enzymes Recent Labs  Lab 12/31/17 0354 01/03/18 1446 01/04/18 0548  AST 21 48* 47*  ALT 54 165* 162*  ALKPHOS 56 86 79  BILITOT 3.0* 0.9 1.4*  ALBUMIN 2.4* 2.4* 2.2*    Cardiac Enzymes Recent Labs  Lab 01/03/18 1446 01/03/18 1851 01/04/18 0217  TROPONINI 0.37* 0.33* 0.21*    Glucose Recent Labs  Lab 01/04/18 1247 01/04/18 1650 01/04/18 2017 01/04/18 2305 01/05/18 0313 01/05/18 0833  GLUCAP 328* 292* 282* 288* 186* 251*    Imaging No results found. STUDIES:  CT head 12/28/2017 >>1. Bilateral subdural hematomas,  right larger than left. Focal collection of blood in the right frontoparietal region with hematocrit level, ongoing active bleeding not excluded. Short interval follow-up recommended. 2. Minimal mass effect without significant midline shift. MRI brian 4/1>>> 1. Stable right greater than left subdural hematomas since the head CT yesterday.  The right side subdural demonstrates greater lobulation and signal heterogeneity measuring between 8 and 17 mm in thickness. The smaller more homogeneous left side subdural measures 6-7 mm in thickness. 2. Stable mild intracranial mass effect with trace leftward midline shift. Normal basilar cisterns. Trace intraventricular hemorrhage with no ventriculomegaly. 3. No superimposed acute infarct. CT head 4/2>>> 1. Stable right larger than left mixed attenuation subdural hematomas. Stable minimal associated mass effect. No herniation. 2. No new acute intracranial abnormality identified. US thyroid 4/4>>> 1.5 cm right mid thyroid TR 4 nodule meets criteria for biopsy as Above.  1.4 cm left inferior TR 3 nodule does not meet criteria for any biopsy or follow-up.   Nonspecific gland heterogeneity.  CULTURES: BC x 2 3/29>>> 1/2 coag neg staph>> suspect contaminant  BC x 2 4/1>>> neg   LINES/TUBES:  ETT 4/2>> 4/4 RUA PICC 3/30>>>  ANTIBIOTICS: Zosyn 3/30>> 4/5, restart 4/6 Vanc 3/31>>>4/3  SIGNIFICANT EVENTS: 12/30/2017 transfer to ICU for worsening resp failure and mental status with afib RVR  01/01/2018>> Extubated  4/5 >> Transfer back to ICU with similar presentation  ASSESSMENT / PLAN: Altered Mental Status It remains a little unclear what caused his recent worsening mental status. No sign of new pneunmonia His CT head of 4/6 appeared to show improvement in the right subdural hematoma.  Procal is <01. T4 free is decreased from a few days ago. His sodium went up to 160 but now appears to be trending down at 155. There has been no sign of an  overt seizure.  The patient's sodium last night was 142 ( actual sodium likely closr to 148 given his cosxistent hyoerglycemia) We are checking an new BMP presently.  PULMONARY I don't believe the patient had a PE. His 02 needs are fairly minimal. I would be concerned about performing CTA with his kidney dysfn.  Close monitoring for re intubation need Bilaterl lower leg dopplers have been ordered and were negative. He is managing well withpout BIPAP presently.    CARDIOVASCULAR  BP is stable. the patient is in a NSR.  HTN emergency. His troponin was 0.21 on 4/7 and had peaked on  4/6 aat 0.37 and I am not overly concerned.     Atrial fibrillation with RVR  2D echo 3//30>> EF -65%, mod hypertrophy, trivial Dwayne, trivial TR, severely dilated RA and LA  Continue Cardizem. IV lopressor   Endocrine The patient had a brief episode of thyrotoxicosis. His free T4 is going down. TSH still remains undetectable. Ion have lowered the Inderal dose as I feel it amy be contributing to some of the patient's loss of mental acuity . He was getting q 4h. Continue methimnazole Will need bx of at least 1 thyroid nodule at some point  Continue steroids SSI   Hypernatremia Last  appeared roughly in the normal range. I am going to stop free water flushes and d 5w at this time. Will continue to check bid BMP   HEMATOLOGIC A:   Leukocytosis most likely reactive and improving Anemia - mild   As noted Procal was negative. He did have temp to 100.9 at 17 :30 on 4/6. Was placed  Empiric abx therapy

## 2018-01-06 ENCOUNTER — Inpatient Hospital Stay (HOSPITAL_COMMUNITY): Payer: Medicare Other

## 2018-01-06 LAB — CBC WITH DIFFERENTIAL/PLATELET
BASOS ABS: 0 10*3/uL (ref 0.0–0.1)
Basophils Relative: 0 %
Eosinophils Absolute: 0.2 10*3/uL (ref 0.0–0.7)
Eosinophils Relative: 1 %
HCT: 34.7 % — ABNORMAL LOW (ref 39.0–52.0)
HEMOGLOBIN: 10.6 g/dL — AB (ref 13.0–17.0)
LYMPHS ABS: 1.3 10*3/uL (ref 0.7–4.0)
LYMPHS PCT: 9 %
MCH: 26 pg (ref 26.0–34.0)
MCHC: 30.5 g/dL (ref 30.0–36.0)
MCV: 85.3 fL (ref 78.0–100.0)
Monocytes Absolute: 1.5 10*3/uL — ABNORMAL HIGH (ref 0.1–1.0)
Monocytes Relative: 11 %
NEUTROS PCT: 79 %
Neutro Abs: 10.9 10*3/uL — ABNORMAL HIGH (ref 1.7–7.7)
Platelets: 323 10*3/uL (ref 150–400)
RBC: 4.07 MIL/uL — AB (ref 4.22–5.81)
RDW: 14.9 % (ref 11.5–15.5)
WBC: 13.9 10*3/uL — AB (ref 4.0–10.5)

## 2018-01-06 LAB — GLUCOSE, CAPILLARY
Glucose-Capillary: 207 mg/dL — ABNORMAL HIGH (ref 65–99)
Glucose-Capillary: 221 mg/dL — ABNORMAL HIGH (ref 65–99)
Glucose-Capillary: 247 mg/dL — ABNORMAL HIGH (ref 65–99)
Glucose-Capillary: 205 mg/dL — ABNORMAL HIGH (ref 65–99)
Glucose-Capillary: 253 mg/dL — ABNORMAL HIGH (ref 65–99)

## 2018-01-06 LAB — URINALYSIS, ROUTINE W REFLEX MICROSCOPIC
BILIRUBIN URINE: NEGATIVE
Glucose, UA: 50 mg/dL — AB
KETONES UR: NEGATIVE mg/dL
Nitrite: NEGATIVE
PH: 6 (ref 5.0–8.0)
Protein, ur: NEGATIVE mg/dL
SPECIFIC GRAVITY, URINE: 1.015 (ref 1.005–1.030)
SQUAMOUS EPITHELIAL / LPF: NONE SEEN

## 2018-01-06 LAB — POCT I-STAT 3, ART BLOOD GAS (G3+)
ACID-BASE EXCESS: 2 mmol/L (ref 0.0–2.0)
Acid-Base Excess: 4 mmol/L — ABNORMAL HIGH (ref 0.0–2.0)
BICARBONATE: 24.4 mmol/L (ref 20.0–28.0)
Bicarbonate: 27.2 mmol/L (ref 20.0–28.0)
O2 SAT: 98 %
O2 SAT: 100 %
PCO2 ART: 37.6 mmHg (ref 32.0–48.0)
PH ART: 7.484 — AB (ref 7.350–7.450)
PO2 ART: 95 mmHg (ref 83.0–108.0)
PO2 ART: 456 mmHg — AB (ref 83.0–108.0)
Patient temperature: 99.2
TCO2: 25 mmol/L (ref 22–32)
TCO2: 28 mmol/L (ref 22–32)
pCO2 arterial: 32.7 mmHg (ref 32.0–48.0)
pH, Arterial: 7.469 — ABNORMAL HIGH (ref 7.350–7.450)

## 2018-01-06 LAB — TRIGLYCERIDES: TRIGLYCERIDES: 213 mg/dL — AB (ref <150)

## 2018-01-06 LAB — PROCALCITONIN: Procalcitonin: <0.1 ng/mL

## 2018-01-06 LAB — T3, FREE: T3, Free: 2 pg/mL (ref 2.0–4.4)

## 2018-01-06 LAB — VITAMIN B12: Vitamin B-12: 984 pg/mL — ABNORMAL HIGH (ref 180–914)

## 2018-01-06 MED ORDER — DILTIAZEM LOAD VIA INFUSION
10.0000 mg | Freq: Once | INTRAVENOUS | Status: AC
  Administered 2018-01-06: 10 mg via INTRAVENOUS
  Filled 2018-01-06: qty 10

## 2018-01-06 MED ORDER — PIPERACILLIN-TAZOBACTAM 3.375 G IVPB
3.3750 g | Freq: Three times a day (TID) | INTRAVENOUS | Status: DC
  Administered 2018-01-06 – 2018-01-08 (×5): 3.375 g via INTRAVENOUS
  Filled 2018-01-06 (×6): qty 50

## 2018-01-06 MED ORDER — DILTIAZEM HCL 100 MG IV SOLR
5.0000 mg/h | INTRAVENOUS | Status: DC
  Administered 2018-01-06: 10 mg/h via INTRAVENOUS
  Administered 2018-01-06: 5 mg/h via INTRAVENOUS
  Administered 2018-01-07: 12.5 mg/h via INTRAVENOUS
  Administered 2018-01-07: 10 mg/h via INTRAVENOUS
  Administered 2018-01-07 – 2018-01-08 (×2): 15 mg/h via INTRAVENOUS
  Administered 2018-01-08: 10 mg/h via INTRAVENOUS
  Administered 2018-01-08 (×2): 15 mg/h via INTRAVENOUS
  Administered 2018-01-09: 10 mg/h via INTRAVENOUS
  Administered 2018-01-09 – 2018-01-10 (×4): 15 mg/h via INTRAVENOUS
  Filled 2018-01-06 (×15): qty 100

## 2018-01-06 MED ORDER — LACTATED RINGERS IV SOLN
INTRAVENOUS | Status: DC
  Administered 2018-01-06 – 2018-01-07 (×2): via INTRAVENOUS

## 2018-01-06 MED ORDER — VANCOMYCIN HCL 10 G IV SOLR
2500.0000 mg | Freq: Once | INTRAVENOUS | Status: AC
  Administered 2018-01-06: 2500 mg via INTRAVENOUS
  Filled 2018-01-06: qty 2500

## 2018-01-06 MED ORDER — INSULIN GLARGINE 100 UNIT/ML ~~LOC~~ SOLN
10.0000 [IU] | Freq: Every day | SUBCUTANEOUS | Status: DC
  Administered 2018-01-06: 10 [IU] via SUBCUTANEOUS
  Filled 2018-01-06 (×2): qty 0.1

## 2018-01-06 MED ORDER — MIDAZOLAM HCL 2 MG/2ML IJ SOLN
INTRAMUSCULAR | Status: AC
  Administered 2018-01-06: 3 mg via INTRAVENOUS
  Filled 2018-01-06: qty 6

## 2018-01-06 MED ORDER — PANTOPRAZOLE SODIUM 40 MG IV SOLR
40.0000 mg | INTRAVENOUS | Status: DC
Start: 2018-01-06 — End: 2018-01-11
  Administered 2018-01-06 – 2018-01-11 (×6): 40 mg via INTRAVENOUS
  Filled 2018-01-06 (×6): qty 40

## 2018-01-06 MED ORDER — LABETALOL HCL 5 MG/ML IV SOLN
20.0000 mg | Freq: Once | INTRAVENOUS | Status: AC
  Administered 2018-01-06: 20 mg via INTRAVENOUS

## 2018-01-06 MED ORDER — IPRATROPIUM-ALBUTEROL 0.5-2.5 (3) MG/3ML IN SOLN
3.0000 mL | Freq: Four times a day (QID) | RESPIRATORY_TRACT | Status: DC
  Administered 2018-01-06 – 2018-01-08 (×9): 3 mL via RESPIRATORY_TRACT
  Filled 2018-01-06 (×11): qty 3

## 2018-01-06 MED ORDER — LABETALOL HCL 5 MG/ML IV SOLN
INTRAVENOUS | Status: AC
  Filled 2018-01-06: qty 4

## 2018-01-06 MED ORDER — MIDAZOLAM HCL 2 MG/2ML IJ SOLN
5.0000 mg | Freq: Once | INTRAMUSCULAR | Status: AC
  Administered 2018-01-06: 3 mg via INTRAVENOUS

## 2018-01-06 MED ORDER — DEXTROSE 5 % IV SOLN: INTRAVENOUS | Status: DC

## 2018-01-06 MED ORDER — ORAL CARE MOUTH RINSE
15.0000 mL | Freq: Four times a day (QID) | OROMUCOSAL | Status: DC
  Administered 2018-01-07 – 2018-01-08 (×8): 15 mL via OROMUCOSAL

## 2018-01-06 MED ORDER — CHLORHEXIDINE GLUCONATE 0.12% ORAL RINSE (MEDLINE KIT)
15.0000 mL | Freq: Two times a day (BID) | OROMUCOSAL | Status: DC
  Administered 2018-01-06 – 2018-01-18 (×24): 15 mL via OROMUCOSAL

## 2018-01-06 MED ORDER — VANCOMYCIN HCL 10 G IV SOLR
1250.0000 mg | INTRAVENOUS | Status: DC
  Administered 2018-01-07: 1250 mg via INTRAVENOUS
  Filled 2018-01-06 (×2): qty 1250

## 2018-01-06 MED ORDER — PIPERACILLIN-TAZOBACTAM 3.375 G IVPB
3.3750 g | Freq: Once | INTRAVENOUS | Status: AC
  Administered 2018-01-06: 3.375 g via INTRAVENOUS
  Filled 2018-01-06: qty 50

## 2018-01-06 MED ORDER — PROPOFOL 1000 MG/100ML IV EMUL
5.0000 ug/kg/min | INTRAVENOUS | Status: DC
  Administered 2018-01-06: 20 ug/kg/min via INTRAVENOUS
  Administered 2018-01-06: 35 ug/kg/min via INTRAVENOUS
  Administered 2018-01-07: 20 ug/kg/min via INTRAVENOUS
  Administered 2018-01-07: 10 ug/kg/min via INTRAVENOUS
  Administered 2018-01-07: 20 ug/kg/min via INTRAVENOUS
  Administered 2018-01-08: 25 ug/kg/min via INTRAVENOUS
  Administered 2018-01-08: 20 ug/kg/min via INTRAVENOUS
  Administered 2018-01-08: 40 ug/kg/min via INTRAVENOUS
  Administered 2018-01-08: 25 ug/kg/min via INTRAVENOUS
  Administered 2018-01-09 (×2): 40 ug/kg/min via INTRAVENOUS
  Administered 2018-01-09 – 2018-01-10 (×2): 20 ug/kg/min via INTRAVENOUS
  Filled 2018-01-06 (×13): qty 100

## 2018-01-06 MED ORDER — SUCCINYLCHOLINE CHLORIDE 20 MG/ML IJ SOLN
100.0000 mg | Freq: Once | INTRAMUSCULAR | Status: DC
  Filled 2018-01-06: qty 5

## 2018-01-06 MED ORDER — IPRATROPIUM-ALBUTEROL 0.5-2.5 (3) MG/3ML IN SOLN: 3.0000 mL | Freq: Four times a day (QID) | RESPIRATORY_TRACT | Status: DC

## 2018-01-06 MED ORDER — ETOMIDATE 2 MG/ML IV SOLN
20.0000 mg | Freq: Once | INTRAVENOUS | Status: AC
  Administered 2018-01-06: 20 mg via INTRAVENOUS

## 2018-01-06 NOTE — Progress Notes (Addendum)
Subjective: Sedated and intubated. Patient had progressively gotten obtunded and requiring intubation. No recent seizure episodes or abnormal jerking movements. . Objective: Current vital signs: BP (!) 168/100   Pulse (!) 119   Temp 100 F (37.8 C) (Axillary)   Resp (!) 29   Ht '6\' 2"'$  (1.88 m)   Wt 110.4 kg (243 lb 6.2 oz)   SpO2 97%   BMI 31.25 kg/m  Vital signs in last 24 hours: Temp:  [97.8 F (36.6 C)-100.2 F (37.9 C)] 100 F (37.8 C) (04/09 0800) Pulse Rate:  [76-119] 119 (04/09 1030) Resp:  [25-38] 29 (04/09 1030) BP: (125-183)/(74-108) 168/100 (04/09 1030) SpO2:  [96 %-100 %] 97 % (04/09 1030) Weight:  [110.4 kg (243 lb 6.2 oz)] 110.4 kg (243 lb 6.2 oz) (04/09 0419)  Intake/Output from previous day: 04/08 0701 - 04/09 0700 In: 1738.8 [I.V.:800.8; NG/GT:838; IV Piggyback:100] Out: 2175 [Urine:1875; Stool:300] Intake/Output this shift: Total I/O In: 180 [I.V.:180] Out: 450 [Urine:450] Nutritional status: Diet NPO time specified  Neurologic Exam: Unable to assess patient's cognition as he is sedated on propofol and intubated. Pupils equal round and reactive to light and accommodation. Face flaccidly symmetric.No response to voice. Normal tone in all 4 extremties without asymmetry.  Absent reflexes. Patient with downgoing toes bilaterally.   Lab Results: Pending Thiamine level  Recent Results (from the past 240 hour(s))  Culture, blood (Routine X 2) w Reflex to ID Panel     Status: None   Collection Time: 12/29/17 11:54 AM  Result Value Ref Range Status   Specimen Description BLOOD BLOOD LEFT HAND  Final   Special Requests   Final    BOTTLES DRAWN AEROBIC ONLY Blood Culture adequate volume   Culture   Final    NO GROWTH 5 DAYS Performed at [facility], [address] [place], [place] [address]    Report Status 01/03/2018 FINAL  Final  Culture, blood (Routine X 2) w Reflex to ID Panel     Status: None   Collection Time: 12/29/17 11:57 AM  Result  Value Ref Range Status   Specimen Description BLOOD BLOOD LEFT HAND  Final   Special Requests   Final    BOTTLES DRAWN AEROBIC ONLY Blood Culture adequate volume   Culture   Final    NO GROWTH 5 DAYS Performed at [facility], [place] [address], [place], [place] [address]    Report Status 01/03/2018 FINAL  Final  Culture, blood (Routine X 2) w Reflex to ID Panel     Status: None (Preliminary result)   Collection Time: 01/03/18  1:17 PM  Result Value Ref Range Status   Specimen Description BLOOD RIGHT ANTECUBITAL  Final   Special Requests   Final    BOTTLES DRAWN AEROBIC ONLY Blood Culture adequate volume   Culture   Final    NO GROWTH 2 DAYS Performed at [facility], [place] [address], [place], [place] [address]    Report Status PENDING  Incomplete  Culture, blood (Routine X 2) w Reflex to ID Panel     Status: None (Preliminary result)   Collection Time: 01/03/18  1:20 PM  Result Value Ref Range Status   Specimen Description BLOOD RIGHT ANTECUBITAL  Final   Special Requests   Final    BOTTLES DRAWN AEROBIC ONLY Blood Culture adequate volume   Culture   Final    NO GROWTH 2 DAYS Performed at [facility], [place],  [place] [address]    Report Status PENDING  Incomplete    Lipid Panel No results for input(s): CHOL, TRIG, HDL, CHOLHDL, VLDL, LDLCALC in the last 72 hours.  Studies/Results: Dg Chest Port 1 View  Result Date: 01/06/2018 CLINICAL DATA:  Tachypnea. EXAM: PORTABLE CHEST 1 VIEW COMPARISON:  Radiograph Feb 03, 2018. FINDINGS: Stable cardiomediastinal silhouette. No pneumothorax or pleural effusion is noted. No acute pulmonary disease is noted. Right-sided PICC line is noted with distal tip in expected position of the SVC. Bony thorax is unremarkable. IMPRESSION: No acute cardiopulmonary abnormality seen. Electronically Signed   By: Marijo Conception, M.D.   On: 01/06/2018 08:55    Medications: Current Facility-Administered Medications:  .  0.9  %  sodium chloride infusion, , Intravenous, Continuous, Aljishi, Virgina Norfolk, MD, Last Rate: 10 mL/hr at 01/06/18 1600 .  [DISCONTINUED] acetaminophen (TYLENOL) tablet 650 mg, 650 mg, Oral, Q6H PRN, 650 mg at 12/27/17 1959 **OR** acetaminophen (TYLENOL) suppository 650 mg, 650 mg, Rectal, Q6H PRN, Truett Mainland, DO, 650 mg at 01/06/18 0844 .  chlorhexidine gluconate (MEDLINE KIT) (PERIDEX) 0.12 % solution 15 mL, 15 mL, Mouth Rinse, BID, Tarry Kos, MD .  Chlorhexidine Gluconate Cloth 2 % PADS 6 each, 6 each, Topical, Daily, Dhungel, Nishant, MD, 6 each at 01/06/18 1237 .  diltiazem (CARDIZEM) 10 mg/ml oral suspension 60 mg, 60 mg, Per Tube, Q8H, Sood, Vineet, MD, 60 mg at 01/05/18 2142 .  diltiazem (CARDIZEM) 100 mg in dextrose 5 % 100 mL (1 mg/mL) infusion, 5-15 mg/hr, Intravenous, Titrated, Tarry Kos, MD, Last Rate: 10 mL/hr at 01/06/18 1711, 10 mg/hr at 01/06/18 1711 .  feeding supplement (PRO-STAT SUGAR FREE 64) liquid 30 mL, 30 mL, Per Tube, BID, Magdalen Spatz, NP, 30 mL at 01/05/18 2141 .  feeding supplement (VITAL 1.5 CAL) liquid 1,000 mL, 1,000 mL, Per Tube, Continuous, Magdalen Spatz, NP, Last Rate: 60 mL/hr at 01/06/18 1709, 1,000 mL at 01/06/18 1709 .  haloperidol lactate (HALDOL) injection 5 mg, 5 mg, Intravenous, Q6H PRN, Manuella Ghazi, Pratik D, DO, 5 mg at 01/05/18 0307 .  hydrALAZINE (APRESOLINE) injection 10 mg, 10 mg, Intravenous, Q6H PRN, Dhungel, Nishant, MD, 10 mg at 01/06/18 0709 .  hydrocortisone sodium succinate (SOLU-CORTEF) 100 MG injection 50 mg, 50 mg, Intravenous, Daily, Tarry Kos, MD, 50 mg at 01/06/18 1044 .  insulin aspart (novoLOG) injection 0-20 Units, 0-20 Units, Subcutaneous, Q4H, Mannam, Praveen, MD, 7 Units at 01/06/18 1646 .  insulin glargine (LANTUS) injection 10 Units, 10 Units, Subcutaneous, Daily, Tarry Kos, MD, 10 Units at 01/06/18 1043 .  ipratropium-albuterol (DUONEB) 0.5-2.5 (3) MG/3ML nebulizer solution 3 mL, 3 mL,  Nebulization, Q6H WA, Tarry Kos, MD .  lactated ringers infusion, , Intravenous, Continuous, Tarry Kos, MD, Last Rate: 75 mL/hr at 01/06/18 1600 .  levalbuterol (XOPENEX) nebulizer solution 0.63 mg, 0.63 mg, Nebulization, Q2H PRN, Rai, Ripudeep K, MD .  levETIRAcetam (KEPPRA) IVPB 1000 mg/100 mL premix, 1,000 mg, Intravenous, Q12H, Dhungel, Nishant, MD, Stopped at 01/06/18 1702 .  LORazepam (ATIVAN) injection 2 mg, 2 mg, Intravenous, Q4H PRN, Dhungel, Nishant, MD .  MEDLINE mouth rinse, 15 mL, Mouth Rinse, BID, Sood, Vineet, MD, 15 mL at 01/06/18 1045 .  [START ON 01/07/2018] MEDLINE mouth rinse, 15 mL, Mouth Rinse, QID, Tarry Kos, MD .  methimazole (TAPAZOLE) tablet 20 mg, 20 mg, Per Tube, Q6H, Magdalen Spatz, NP, 20 mg at 01/06/18 1706 .  ondansetron (ZOFRAN) tablet 4 mg,  4 mg, Oral, Q6H PRN **OR** ondansetron (ZOFRAN) injection 4 mg, 4 mg, Intravenous, Q6H PRN, Truett Mainland, DO .  pantoprazole (PROTONIX) injection 40 mg, 40 mg, Intravenous, Q24H, Anders Simmonds, MD, 40 mg at 01/06/18 0201 .  piperacillin-tazobactam (ZOSYN) IVPB 3.375 g, 3.375 g, Intravenous, Q8H, Mannam, Praveen, MD .  potassium chloride 20 MEQ/15ML (10%) solution 40 mEq, 40 mEq, Per Tube, BID, Chesley Mires, MD, 40 mEq at 01/05/18 2139 .  propofol (DIPRIVAN) 1000 MG/100ML infusion, 5-80 mcg/kg/min, Intravenous, Titrated, Tarry Kos, MD, Last Rate: 13.2 mL/hr at 01/06/18 1600, 19.928 mcg/kg/min at 01/06/18 1600 .  propranolol (INDERAL) tablet 20 mg, 20 mg, Oral, Q6H, Tarry Kos, MD, 20 mg at 01/06/18 1707 .  sodium chloride flush (NS) 0.9 % injection 10-40 mL, 10-40 mL, Intracatheter, Q12H, Dhungel, Nishant, MD, 30 mL at 01/06/18 1045 .  sodium chloride flush (NS) 0.9 % injection 10-40 mL, 10-40 mL, Intracatheter, PRN, Dhungel, Nishant, MD, 10 mL at 12/29/17 2156 .  succinylcholine (ANECTINE) injection 100 mg, 100 mg, Intravenous, Once, Tarry Kos, MD .  Derrill Memo ON  01/07/2018] vancomycin (VANCOCIN) 1,250 mg in sodium chloride 0.9 % 250 mL IVPB, 1,250 mg, Intravenous, Q24H, Romona Curls, [facility] .  vancomycin (VANCOCIN) 2,500 mg in sodium chloride 0.9 % 500 mL IVPB, 2,500 mg, Intravenous, Once, Romona Curls, [facility], Last Rate: 250 mL/hr at 01/06/18 1650, 2,500 mg at 01/06/18 1650  EEG 01/04/18: Mild generalized irregular slow activity.  Clinical Interpretation: This EEG is consistent with a mild generalized non-specific cerebral dysfunction(encephalopathy). There was no seizure or seizure predisposition recorded on this study. Please note that a normal EEG does not preclude the possibility of epilepsy.   Assessment Mr Freese is a 72 yr old male with history of a fib, stage 3 chronic kidney disease, DM2, adenocarcinoma of colon s/p resection and current colostomy, history of renal cell carcinoma s/o resection who was admitted on 12/19/2017 for shortnes s of breath, supratherputic INR,  afib with RVR and pulm edema. During his stay he was diagnosed with thyrotoxicosis, bilateral SDH and seizure. On 4/2 PCCM consulted for increased work of breathing, hypoxia, AMS.  He was intubated and tx to ICU.  He was transferred out of the ICU on 01/02/2018 and sent back to the ICU on 01/03/2018 when he became acutely dyspneic with tachypnea.  Patient was started on BiPAP and became comfortable, more awake and became more interactive. Had to be reintubated again today.   1.  Multifactorial encephalopathy-initially thought to be metabolic or alcoholic in pt with bilateral subdural hematoma.  Over the last 2 days patient has become more obtunded with some aggression but the persistence of his AMS was concerning despite aggressive management of metabolic conditions.  Patient has not had any visible seizures today per nurse, last CT of the head on 01/03/2018 showed continued improvement right hemispheric subdural hematoma, with no new area of hemorrhage or infarction.  EEG did not show evidence of  ongoing seizure.  Currently pending infectious workup 2.  A. fib with RVR currently on Cardizem drip 3. Bilateral Subdural Hematoma 4. Hyperthyroidism 5. Seizure   Plan: - Continue aggressive management of metabolic problems - Patient is currently intubated and sedated on propofol-Neurology to perform more accurate neuro exam when patient is off sedation and possibly extubated - Due to decreasing mentation led to current intubation, will request CT of the head to further evaluate the stability of the subdural hematomas. - Patient with a history of alcohol  abuse, check and possibly start banana bag pending results. - Continue Keppra for seizures - Repeat EEG given persistent AMS (ordered)   LOS: 11 days   '@Electronically'$  signed:  Jacob Moores 5:04 PM 01/06/18

## 2018-01-06 NOTE — Progress Notes (Signed)
[facility] Progress Note Patient Name: [name] DOB: 1945/10/10 MRN: [id number]   Date of Service  01/06/2018  HPI/Events of Note  Bedside nurse unable to place NGT. Patient currently getting Lantus + Novolog SSI.   eICU Interventions  Will order: 1. D5W to run IV at 50 mL/hour.      Intervention Category Major Interventions: Other:  Sommer,Steven Cornelia Copa 01/06/2018, 1:25 AM

## 2018-01-06 NOTE — Progress Notes (Signed)
Pharmacy Antibiotic Note  [name] is a 72 y.o. male admitted on 12/25/2017 with pneumonia and sepsis.  Pharmacy has been consulted for Zosyn dosing.   Plan: Zosyn 3.375g IV q8h EI Follow renal function, LOT, repeat BCx sent today   Height: 6\' 2"  (188 cm) Weight: 243 lb 6.2 oz (110.4 kg) IBW/kg (Calculated) : 82.2  Temp (24hrs), Avg:98.7 F (37.1 C), Min:97.8 F (36.6 C), Max:100 F (37.8 C)  Recent Labs  Lab 12/30/17 1942 12/31/17 0151  12/31/17 0354 01/01/18 0453  01/03/18 0311 01/03/18 1446 01/03/18 1851 01/04/18 0548 01/04/18 1746 01/05/18 1029 01/05/18 1652 01/06/18 0838  WBC  --   --   --  9.2 9.8  --  11.4* 13.5*  --   --   --   --   --  13.9*  CREATININE  --   --    < > 1.34* 1.55*   < > 1.33* 1.41* 1.42* 1.33* 1.34* 1.31* 1.27*  --   LATICACIDVEN 1.0  --   --   --   --   --   --  1.9 1.8  --   --   --   --   --   VANCOTROUGH  --  11*  --   --   --   --   --   --   --   --   --   --   --   --    < > = values in this interval not displayed.    Estimated Creatinine Clearance: 72.6 mL/min (A) (by C-G formula based on SCr of 1.27 mg/dL (H)).    No Known Allergies  Vancomycin 3/29>>4/3 Zosyn 3/30>>4/5; 4/6  3/29 BCx: 1/2 - Staph species (no resistance) 4/1 Blood: neg 3/29 MRSA PCR: neg 4/6 BCx: ngtd 4/9 BCx:    Thank you for allowing pharmacy to be a part of this patient's care.  Daliah Chaudoin D. Jayli Fogleman, PharmD, BCPS Clinical Pharmacist Clinical Phone for 01/06/2018 until 3:30pm: [phone] If after 3:30pm, please call main pharmacy at [phone] 01/06/2018 2:45 PM

## 2018-01-06 NOTE — Progress Notes (Signed)
E-link MD made aware of removal of feeding tube. Was told to go ahead and try to place another NG tube. Will continue to monitor.

## 2018-01-06 NOTE — Progress Notes (Addendum)
Inpatient Diabetes Program Recommendations  AACE/ADA: New Consensus Statement on Inpatient Glycemic Control (2015)  Target Ranges:  Prepandial:   less than 140 mg/dL      Peak postprandial:   less than 180 mg/dL (1-2 hours)      Critically ill patients:  140 - 180 mg/dL   Lab Results  Component Value Date   GLUCAP 221 (H) 01/06/2018   HGBA1C 6.1 (H) 12/29/2017    Review of Glycemic Control Results for [name], [name] (MRN [id number]) as of 01/06/2018 11:08  Ref. Range 01/05/2018 20:17 01/05/2018 23:39 01/06/2018 03:40 01/06/2018 08:26  Glucose-Capillary Latest Ref Range: 65 - 99 mg/dL 240 (H) 234 (H) 207 (H) 221 (H)    Diabetes history:DM2 Outpatient Diabetes medications:Metformin 1000 mg BID, Actos 45 mg daily Current orders for Inpatient glycemic control:Novolog 0-20 units Q4H; Solucortef 50 mg QD  Inpatient Diabetes Program Recommendations:  Noted increase to Lantus to 10 units QD and difficulty replacing NG tube. If tube feeds continue, consider Novolog 4 units Q4H.  Thanks, Bronson Curb, MSN, RNC-OB Diabetes Coordinator [phone] (8a-5p)

## 2018-01-06 NOTE — Progress Notes (Signed)
  Speech Language Pathology Treatment: Dysphagia  Patient Details Name: [name] MRN: [id number] DOB: 08/20/1946 Today's Date: 01/06/2018 Time: 7829-5621 SLP Time Calculation (min) (ACUTE ONLY): 12 min  Assessment / Plan / Recommendation Clinical Impression  Pt has pulled Cortrak, RN has attempted to place NG over multiple trials with pt resistance. SLP attempts to arouse pt for oral intake of necessary meds unsuccessful. Performed oral care, repositioning, washed pts face with no successful response. Firmly rolled trap muscle and pt awoke and hit this SLP. Took the opportunity to give a teaspoon of honey thick liquids, but pt immediately became unresponsive again and did not manipulate bolus which was suctioned from his mouth. Regardless of dysphagia and aspiration risk, pt is not arousable for PO, certainly would not be able to consistently take oral meds q4 and q8 as ordered.    HPI HPI: [name] is a 72 y.o. male with a history of a fib, stage 3 chron c kidney disease, DM2, adenocarcinoma of colon s/p resection and current colostomy, history of renal cell carcinoma s/o resection.  Patient presents with worsening shortness of breath over the past month with dyspnea that worsens with minimal exertion. MRI stable right greater than left subdural hematomas since the head CT The right side subdural demonstrates greater lobulation and signal heterogeneity measuring between 8 and 17 mm in thickness. The Stable mild intracranial mass effect with trace leftward midline shift. Normal basilar cisterns. Trace intraventricular hemorrhage with no ventriculomegaly. MBS recommended after BSE.      SLP Plan  Continue with current plan of care       Recommendations  Diet recommendations: NPO                Oral Care Recommendations: Oral care QID Follow up Recommendations: Inpatient Rehab SLP Visit Diagnosis: Dysphagia, oropharyngeal phase (R13.12) Plan: Continue with current plan of  care       GO               [facility], MA CCC-SLP [phone]  Lynann Beaver 01/06/2018, 9:54 AM

## 2018-01-06 NOTE — Progress Notes (Signed)
Palliative:  I came by to follow up on progress of [name] and support wife. No family at bedside. RN reports he was recently reintubated for airway protection and plans for repeat head CT. Will follow up tomorrow.   No charge  Vinie Sill, NP Palliative Medicine Team Pager # [phone] (M-F 8a-5p) Team Phone # [phone] (Nights/Weekends)

## 2018-01-06 NOTE — Progress Notes (Signed)
Pharmacy Antibiotic Note  [name] is a 72 y.o. male admitted on 12/25/2017 with pneumonia and sepsis.  Pharmacy has been consulted for Zosyn and now adding vancomycin dosing. Previously on vancomycin 3/29 - 4/3. SCr 1.27 on 4/8 - relatively stable, normalized CrCl~55-60.   Plan: Vancomycin 2500mg g IV x1; then 1250mg  IV q24h Continue Zosyn 3.375g IV q8h EI Follow renal function, LOT, repeat BCx sent today, Vancomycin trough at steady state   Height: 5\' 9"  (175.3 cm) Weight: 243 lb 6.2 oz (110.4 kg) IBW/kg (Calculated) : 70.7  Temp (24hrs), Avg:98.7 F (37.1 C), Min:97.8 F (36.6 C), Max:100 F (37.8 C)  Recent Labs  Lab 12/30/17 1942 12/31/17 0151  12/31/17 0354 01/01/18 0453  01/03/18 0311 01/03/18 1446 01/03/18 1851 01/04/18 0548 01/04/18 1746 01/05/18 1029 01/05/18 1652 01/06/18 0838  WBC  --   --   --  9.2 9.8  --  11.4* 13.5*  --   --   --   --   --  13.9*  CREATININE  --   --    < > 1.34* 1.55*   < > 1.33* 1.41* 1.42* 1.33* 1.34* 1.31* 1.27*  --   LATICACIDVEN 1.0  --   --   --   --   --   --  1.9 1.8  --   --   --   --   --   VANCOTROUGH  --  11*  --   --   --   --   --   --   --   --   --   --   --   --    < > = values in this interval not displayed.    Estimated Creatinine Clearance: 67.2 mL/min (A) (by C-G formula based on SCr of 1.27 mg/dL (H)).    No Known Allergies  Vancomycin 3/29>>4/3 Zosyn 3/30>>4/5; 4/6  3/29 BCx: 1/2 - Staph species (no resistance) 4/1 Blood: neg 3/29 MRSA PCR: neg 4/6 BCx: ngtd 4/9 BCx:   Elicia Lamp, PharmD, BCPS Clinical Pharmacist Clinical phone for 01/06/2018 until 3:30pm: [phone] If after 3:30pm, please call main pharmacy at: [phone] 01/06/2018 3:24 PM

## 2018-01-06 NOTE — Progress Notes (Signed)
PULMONARY / CRITICAL CARE MEDICINE   Name: [name] MRN: [id number] DOB: [birth year]    ADMISSION DATE:  12/04/2017 CONSULTATION DATE: 12/30/2017  REFERRING MD:  Karie Kirks  CHIEF COMPLAINT:  Shortness of breath   HISTORY OF PRESENT ILLNESS:   [name] is a 72 yr old male with history of a fib, stage 3 chronic kidney disease, DM2, adenocarcinoma of colon s/p resection and current colostomy, history of renal cell carcinoma s/o resection who was admitted on 12/04/2017 for shortnes s of breath, supratherputic INR,  afib with RVR and pulm edema. During his stay he was diagnosed with thyrotoxicosis and bilateral SDH and seizure. On 4/2 PCCM consulted for increased work of breathing, hypoxia, AMS.  He was intubated and tx to ICU.      Recent Events Extubated.  Transferred out of ICU on 4/5 Called back on 4/6 as he became acutely dyspneic with tachypnea Chest x-ray is better than before and ABG shows mild alkalosis  4/7 The patient is awake and appears relatively comfortable. He remains on BIPAP. His CXR was fairly clear. ABG shows a pH of 7.44/ PacO2 45/Pa02  132  4/8 Patient opens eyes but noit trying to speak this AM. He is on  ansal cannula and maintaining his 02 sats.  4/9 The patient remains obtunded. He is a bit tachypneic as well.  He has a low grade temp of about 100. His wife is fairly concerned aboiut the patient. His last CT scan of 4/6 actually appeared improved and his TFTs appeared recently to be trending in the right direction   VITAL SIGNS: BP (!) 147/80   Pulse (!) 116   Temp 100 F (37.8 C) (Axillary)   Resp (!) 37   Ht 6\' 2"  (1.88 m)   Wt 243 lb 6.2 oz (110.4 kg)   SpO2 97%   BMI 31.25 kg/m         INTAKE / OUTPUT: I/O last 3 completed shifts: In: 4258.8 [I.V.:2720.8; NG/GT:1238; IV Piggyback:300] Out: 9678 [LFYBO:1751; Stool:855]  PHYSICAL EXAMINATION: Gen:      No acute distress. [name] on BIPAP HEENT:  EOMI, sclera anicteric. NGT in  left nare Neck:     No masses; no thyromegaly Lungs:    Clear to auscultation bilaterally; normal respiratory effort CV:         Regular rate and rhythm; no murmurs Abd:      Obese,+ bowel sounds; soft, non-tender; no palpable masses, no distension Ext:    No edema; adequate peripheral perfusion Skin:      Warm and dry; no rash Neuro: Somnolent, opens eyes to painful stimuli. Will not respond appropriatelt to my questions or commands presently.he did answer that his wfe was at the bedside.he has adifficult tome keeoing hsi eyes open presentl;y  LABS:  BMET Recent Labs  Lab 01/04/18 1746 01/05/18 1029 01/05/18 1652  NA 142 140 141  K 3.3* 3.7 3.6  CL 105 102 107  CO2 25 25 27   BUN 31* 24* 25*  CREATININE 1.34* 1.31* 1.27*  GLUCOSE 473* 277* 269*    Electrolytes Recent Labs  Lab 01/01/18 0453 01/01/18 1819  01/04/18 0548 01/04/18 1746 01/05/18 1029 01/05/18 1652  CALCIUM 9.3  --    < > 9.4 9.1 9.6 9.5  MG 2.3 2.2  --  1.8  --   --   --   PHOS 4.8* 3.4  --  3.0  --   --   --    < > =  values in this interval not displayed.    CBC Recent Labs  Lab 01/01/18 0453 01/03/18 0311 01/03/18 1446  WBC 9.8 11.4* 13.5*  HGB 8.6* 10.2* 10.5*  HCT 27.6* 34.4* 36.0*  PLT 238 357 380    Coag's Recent Labs  Lab 12/31/17 0354  INR 1.36    Sepsis Markers Recent Labs  Lab 12/30/17 1942  01/03/18 1446 01/03/18 1851 01/04/18 0548 01/05/18 0529  LATICACIDVEN 1.0  --  1.9 1.8  --   --   PROCALCITON  --    < > <0.10  --  <0.10 <0.10   < > = values in this interval not displayed.    ABG Recent Labs  Lab 12/30/17 1459 01/03/18 1248 01/04/18 0350  PHART 7.509* 7.459* 7.442  PCO2ART 40.0 44.1 45.0  PO2ART 176* 84.3 132*    Liver Enzymes Recent Labs  Lab 12/31/17 0354 01/03/18 1446 01/04/18 0548  AST 21 48* 47*  ALT 54 165* 162*  ALKPHOS 56 86 79  BILITOT 3.0* 0.9 1.4*  ALBUMIN 2.4* 2.4* 2.2*    Cardiac Enzymes Recent Labs  Lab 01/03/18 1446  01/03/18 1851 01/04/18 0217  TROPONINI 0.37* 0.33* 0.21*    Glucose Recent Labs  Lab 01/05/18 1226 01/05/18 1619 01/05/18 2017 01/05/18 2339 01/06/18 0340 01/06/18 0826  GLUCAP 234* 288* 240* 234* 207* 221*    Imaging No results found. STUDIES:  CT head 12/28/2017 >>1. Bilateral subdural hematomas, right larger than left. Focal collection of blood in the right frontoparietal region with hematocrit level, ongoing active bleeding not excluded. Short interval follow-up recommended. 2. Minimal mass effect without significant midline shift. MRI brian 4/1>>> 1. Stable right greater than left subdural hematomas since the head CT yesterday.  The right side subdural demonstrates greater lobulation and signal heterogeneity measuring between 8 and 17 mm in thickness. The smaller more homogeneous left side subdural measures 6-7 mm in thickness. 2. Stable mild intracranial mass effect with trace leftward midline shift. Normal basilar cisterns. Trace intraventricular hemorrhage with no ventriculomegaly. 3. No superimposed acute infarct. CT head 4/2>>> 1. Stable right larger than left mixed attenuation subdural hematomas. Stable minimal associated mass effect. No herniation. 2. No new acute intracranial abnormality identified. US thyroid 4/4>>> 1.5 cm right mid thyroid TR 4 nodule meets criteria for biopsy as Above.  1.4 cm left inferior TR 3 nodule does not meet criteria for any biopsy or follow-up.   Nonspecific gland heterogeneity.  CULTURES: BC x 2 3/29>>> 1/2 coag neg staph>> suspect contaminant  BC x 2 4/1>>> neg   LINES/TUBES:  ETT 4/2>> 4/4 RUA PICC 3/30>>>  ANTIBIOTICS: Zosyn 3/30>> 4/5, restart 4/6 Vanc 3/31>>>4/3  SIGNIFICANT EVENTS: 12/30/2017 transfer to ICU for worsening resp failure and mental status with afib RVR  01/01/2018>> Extubated  4/5 >> Transfer back to ICU with similar presentation  ASSESSMENT / PLAN: Altered Mental Status It remains a little unclear  what caused his recent worsening mental status. No sign of new pneunmonia His CT head of 4/6 appeared to show improvement in the right subdural hematoma.  Procal is <01. T4 free is decreased from a few days ago. His sodium went up to 160 but now appears to be trending down at 155. There has been no sign of an overt seizure.  The patient's sodium last night was 142 ( actual sodium likely closr to 148 given his cosxistent hyoerglycemia) We are checking an new BMP presently. I am rechecking labs including Procal. The patient has been on Zosyn since  4/6. Wecare getting new blood cultures, CXR and ABG   PULMONARY I don't believe the patient had a PE. His 02 needs are fairly minimal. I would be concerned about performing CTA with his kidney dysfn.  Close monitoring for re intubation need Bilaterl lower leg dopplers have been ordered and were negative. He is managing well withpout BIPAP presently. Wevare obtraining new ABG and CXR as the patient appears tachypneic. 02 sat is in the high 90s on nasal cannula    CARDIOVASCULAR  BP is stable. the patient is in a NSR.  HTN emergency. His troponin was 0.21 on 4/7 and had peaked on 4/6 aat 0.37 and I am not overly concerned. He is in Atrial fib with heart rate 110-120  2D echo 3//30>> EF -65%, mod hypertrophy, trivial Dwayne, trivial TR, severely dilated RA and LA  Continue Cardizem. IV lopressor   Endocrine The patient had a brief episode of thyrotoxicosis. His free T4 is going down. TSH still remains undetectable. Ion have lowered the Inderal dose as I feel it amy be contributing to some of the patient's loss of mental acuity . He was getting q 4h. Continue methimnazole Will need bx of at least 1 thyroid nodule at some point  Continue steroids SSI  I have increased lantus from 6-10 units.  Hypernatremia Last  appeared roughly in the normal range. I am going to stop free water flushes and d 5w at this time. Will continue to check bid BMP. His  last na yesterday was 141.   HEMATOLOGIC A:   Leukocytosis most likely reactive and improving Anemia - mild   As noted Procal was negative. He did have temp to 100.9 at 17 :30 on 4/6.  Was placed  Empiric abx therapy

## 2018-01-06 NOTE — Progress Notes (Signed)
E-link MD made aware of patient being combative while trying to place NG tube, and NG tube was unable to be placed. New orders received, will continue to monitor.

## 2018-01-07 ENCOUNTER — Inpatient Hospital Stay (HOSPITAL_COMMUNITY): Payer: Medicare Other

## 2018-01-07 DIAGNOSIS — G9340 Encephalopathy, unspecified: Secondary | ICD-10-CM

## 2018-01-07 DIAGNOSIS — Z515 Encounter for palliative care: Secondary | ICD-10-CM

## 2018-01-07 LAB — PROCALCITONIN

## 2018-01-07 LAB — BLOOD CULTURE ID PANEL (REFLEXED)
ACINETOBACTER BAUMANNII: NOT DETECTED
CANDIDA ALBICANS: NOT DETECTED
CANDIDA GLABRATA: NOT DETECTED
Candida krusei: NOT DETECTED
Candida parapsilosis: NOT DETECTED
Candida tropicalis: NOT DETECTED
ENTEROBACTER CLOACAE COMPLEX: NOT DETECTED
ENTEROBACTERIACEAE SPECIES: NOT DETECTED
Enterococcus species: NOT DETECTED
Escherichia coli: NOT DETECTED
Haemophilus influenzae: NOT DETECTED
KLEBSIELLA OXYTOCA: NOT DETECTED
Klebsiella pneumoniae: NOT DETECTED
Listeria monocytogenes: NOT DETECTED
Methicillin resistance: NOT DETECTED
NEISSERIA MENINGITIDIS: NOT DETECTED
PSEUDOMONAS AERUGINOSA: NOT DETECTED
Proteus species: NOT DETECTED
STREPTOCOCCUS PNEUMONIAE: NOT DETECTED
STREPTOCOCCUS PYOGENES: NOT DETECTED
STREPTOCOCCUS SPECIES: NOT DETECTED
Serratia marcescens: NOT DETECTED
Staphylococcus aureus (BCID): NOT DETECTED
Staphylococcus species: DETECTED — AB
Streptococcus agalactiae: NOT DETECTED

## 2018-01-07 LAB — GLUCOSE, CAPILLARY
GLUCOSE-CAPILLARY: 311 mg/dL — AB (ref 65–99)
GLUCOSE-CAPILLARY: 248 mg/dL — AB (ref 65–99)
GLUCOSE-CAPILLARY: 205 mg/dL — AB (ref 65–99)
GLUCOSE-CAPILLARY: 204 mg/dL — AB (ref 65–99)
Glucose-Capillary: 202 mg/dL — ABNORMAL HIGH (ref 65–99)
Glucose-Capillary: 249 mg/dL — ABNORMAL HIGH (ref 65–99)
Glucose-Capillary: 293 mg/dL — ABNORMAL HIGH (ref 65–99)
Glucose-Capillary: 307 mg/dL — ABNORMAL HIGH (ref 65–99)
Glucose-Capillary: 372 mg/dL — ABNORMAL HIGH (ref 65–99)

## 2018-01-07 MED ORDER — PRO-STAT SUGAR FREE PO LIQD
60.0000 mL | Freq: Every day | ORAL | Status: DC
  Administered 2018-01-07 – 2018-01-14 (×36): 60 mL
  Filled 2018-01-07 (×35): qty 60

## 2018-01-07 MED ORDER — DEXTROSE 50 % IV SOLN: 25.0000 mL | INTRAVENOUS | Status: DC | PRN

## 2018-01-07 MED ORDER — SODIUM CHLORIDE 0.9 % IV SOLN: INTRAVENOUS | Status: DC

## 2018-01-07 MED ORDER — VITAL HIGH PROTEIN PO LIQD
1000.0000 mL | ORAL | Status: DC
  Administered 2018-01-07 – 2018-01-13 (×6): 1000 mL

## 2018-01-07 MED ORDER — DEXTROSE-NACL 5-0.45 % IV SOLN: INTRAVENOUS | Status: DC

## 2018-01-07 MED ORDER — ADULT MULTIVITAMIN LIQUID CH
15.0000 mL | Freq: Every day | ORAL | Status: DC
  Administered 2018-01-07 – 2018-01-30 (×23): 15 mL
  Filled 2018-01-07 (×23): qty 15

## 2018-01-07 MED ORDER — SODIUM CHLORIDE 0.9 % IV SOLN
INTRAVENOUS | Status: DC
  Administered 2018-01-07: 1.5 [IU]/h via INTRAVENOUS
  Administered 2018-01-08: 6.7 [IU]/h via INTRAVENOUS
  Administered 2018-01-09: 8.8 [IU]/h via INTRAVENOUS
  Administered 2018-01-09: 11.1 [IU]/h via INTRAVENOUS
  Administered 2018-01-09: 11.7 [IU]/h via INTRAVENOUS
  Administered 2018-01-10: 2.7 [IU]/h via INTRAVENOUS
  Administered 2018-01-10: 8.1 [IU]/h via INTRAVENOUS
  Filled 2018-01-07 (×6): qty 1

## 2018-01-07 MED ORDER — DEXTROSE-NACL 5-0.45 % IV SOLN
INTRAVENOUS | Status: DC
  Administered 2018-01-07 – 2018-01-09 (×3): via INTRAVENOUS

## 2018-01-07 MED ORDER — SODIUM CHLORIDE 0.9 % IV SOLN
INTRAVENOUS | Status: DC
  Administered 2018-01-07 – 2018-01-10 (×2): via INTRAVENOUS

## 2018-01-07 MED ORDER — INSULIN GLARGINE 100 UNIT/ML ~~LOC~~ SOLN
20.0000 [IU] | Freq: Every day | SUBCUTANEOUS | Status: DC
  Administered 2018-01-07: 20 [IU] via SUBCUTANEOUS
  Filled 2018-01-07: qty 0.2

## 2018-01-07 NOTE — Progress Notes (Signed)
Nutrition Follow-up  DOCUMENTATION CODES:   Obesity unspecified  INTERVENTION:   D/C Vital 1.5  Vital High Protein @ 15 ml/hr (360 ml/day) 60 ml Prostat five times per day MVI daily  Provides: 1360 kcal, 181 grams protein, and 300 ml free water.  TF regimen and propofol at current rate providing 1534 total kcal/day    NUTRITION DIAGNOSIS:   Inadequate oral intake related to acute illness as evidenced by NPO status. Ongoing.   GOAL:   Patient will meet greater than or equal to 90% of their needs Met.   MONITOR:   TF tolerance, Labs, Weight trends, Skin  ASSESSMENT:    72 yo male admitted with acute respiratory failure with acute pulmonary edem and possible aspiration pneumonia; pt also with acute metabolic encephalopathy, bilateral subdural hematomas, seizures and thyroid storm. Pt with hx of CKD III, DM, adenocarcinoma of colon s/p resection and colostomy, renal cell carcinoma   Pt discussed during ICU rounds and with RN.  Pt back on vent, palliative has evaluated, per NP note pt with hx of ETOH with possible Wernicke's  Pt unable to wean today per RN. Per CCM possible respiratory infection causing encephalopathy   4/2 - 4/4 intubated 4/9 re-intubated  Patient is currently intubated on ventilator support  Propofol: 6.6 ml/hr provides: 174 kcal per day Medications reviewed and include: lantus 20 units daily, SSI Labs reviewed: TG: 213 (H) CBG (last 3)  Recent Labs    01/07/18 0402 01/07/18 0825 01/07/18 1153  GLUCAP 311* 307* 293*     Diet Order:  Diet NPO time specified  EDUCATION NEEDS:   Not appropriate for education at this time  Skin:  Skin Assessment: Reviewed RN Assessment  Last BM:  175 ml x 24 hr via colostomy  Height:   Ht Readings from Last 1 Encounters:  01/06/18 '5\' 9"'$  (1.753 m)    Weight:   Wt Readings from Last 1 Encounters:  01/07/18 243 lb 2.7 oz (110.3 kg)    Ideal Body Weight:  86 kg  BMI:  Body mass index is 35.91  kg/m.  Estimated Nutritional Needs:   Kcal:  3716-9678  Protein:  >/= 172 grams  Fluid:  >/= 2 L  Maylon Peppers RD, LDN, CNSC [phone] Pager [phone] After Hours Pager

## 2018-01-07 NOTE — Progress Notes (Addendum)
Palliative:  Spent some time further reviewing chart. Spoke with RN and Dr. Debbora Dus at bedside. I did note that neuro made mention of potential alcohol use and withdraw seizures but I did not find any further assessment of alcohol use in chart.    Consider thiamine + multivitamin to r/o Wernicke's??   Consider chest CT if planning repeat head CT to r/o other etiologies although ABG and CXRs are reassuring.   Neuro following. No family at bedside. Will continue to follow along to find opportunity to reengage with family when appropriate. [name] stable on vent this morning, withdraws to pain, opens eyes spontaneously but not following commands. Exam unremarkable other than depressed neurological status (he is on propofol). Will continue to follow along from a distance.   25 min  Vinie Sill, NP Palliative Medicine Team Pager # [phone] (M-F 8a-5p) Team Phone # [phone] (Nights/Weekends)

## 2018-01-07 NOTE — Progress Notes (Signed)
PULMONARY / CRITICAL CARE MEDICINE   Name: [name] MRN: [id number] DOB: 1946/02/28    ADMISSION DATE:  12/05/2017 CONSULTATION DATE: 12/30/2017  REFERRING MD:  [name]  CHIEF COMPLAINT:  Shortness of breath   HISTORY OF PRESENT ILLNESS:   [name] is a 72 yr old male with history of a fib, stage 3 chronic kidney disease, DM2, adenocarcinoma of colon s/p resection and current colostomy, history of renal cell carcinoma s/o resection who was admitted on 12/07/2017 for shortnes s of breath, supratherputic INR,  afib with RVR and pulm edema. During his stay he was diagnosed with thyrotoxicosis and bilateral SDH and seizure. On 4/2 PCCM consulted for increased work of breathing, hypoxia, AMS.  He was intubated and tx to ICU.      Recent Events Extubated.  Transferred out of ICU on 4/5 Called back on 4/6 as he became acutely dyspneic with tachypnea Chest x-ray is better than before and ABG shows mild alkalosis  4/7 The patient is awake and appears relatively comfortable. He remains on BIPAP. His CXR was fairly clear. ABG shows a pH of 7.44/ PacO2 45/Pa02  132  4/8 Patient opens eyes but noit trying to speak this AM. He is on  ansal cannula and maintaining his 02 sats.  4/9 The patient remains obtunded. He is a bit tachypneic as well.  He has a low grade temp of about 100. His wife is fairly concerned aboiut the patient. His last CT scan of 4/6 actually appeared improved and his TFTs appeared recently to be trending in the right direction.  4/10 The patient became progressively obtunded yesterday. We intubated him in the early afternoon. I asked neurolgy to weigh in. It appears to be atoxic-metabolic issue. The patient amy have a respiratory infx. There were thoick green-yellow secretions on suctionoing and visualized in the beck of the pharynx on intubation. The patient had been running a low grade temp as well.   VITAL SIGNS: BP (!) 149/89   Pulse 96   Temp 99.6 F (37.6 C)  (Oral)   Resp (!) 34   Ht 5\' 9"  (1.753 m)   Wt 243 lb 2.7 oz (110.3 kg)   SpO2 100%   BMI 35.91 kg/m         INTAKE / OUTPUT: I/O last 3 completed shifts: In: 4255.3 [I.V.:2644.3; Other:60; NG/GT:1301; IV Piggyback:250] Out: 1920 [YCXKG:8185; Stool:175]  PHYSICAL EXAMINATION: Gen:      No acute distress. [name] on the ventialtor HEENT:  EOMI, sclera anicteric. NGT in left nare Neck:     No masses; no thyromegaly Lungs:    Clear to auscultation bilaterally; normal respiratory effort CV:         Regular rate and rhythm; no murmurs Abd:      Obese,+ bowel sounds; soft, non-tender; no palpable masses, no distension Ext:    No edema; adequate peripheral perfusion Skin:      Warm and dry; no rash Neuro: Somnol;ent. Whenprovoked will open eyes are shake hius head.  LABS:  BMET Recent Labs  Lab 01/04/18 1746 01/05/18 1029 01/05/18 1652  NA 142 140 141  K 3.3* 3.7 3.6  CL 105 102 107  CO2 25 25 27   BUN 31* 24* 25*  CREATININE 1.34* 1.31* 1.27*  GLUCOSE 473* 277* 269*    Electrolytes Recent Labs  Lab 01/01/18 0453 01/01/18 1819  01/04/18 0548 01/04/18 1746 01/05/18 1029 01/05/18 1652  CALCIUM 9.3  --    < > 9.4 9.1 9.6  9.5  MG 2.3 2.2  --  1.8  --   --   --   PHOS 4.8* 3.4  --  3.0  --   --   --    < > = values in this interval not displayed.    CBC Recent Labs  Lab 01/03/18 0311 01/03/18 1446 01/06/18 0838  WBC 11.4* 13.5* 13.9*  HGB 10.2* 10.5* 10.6*  HCT 34.4* 36.0* 34.7*  PLT 357 380 323    Coag's No results for input(s): APTT, INR in the last 168 hours.  Sepsis Markers Recent Labs  Lab 01/03/18 1446 01/03/18 1851  01/05/18 0529 01/06/18 0838 01/07/18 0434  LATICACIDVEN 1.9 1.8  --   --   --   --   PROCALCITON <0.10  --    < > <0.10 <0.10 <0.10   < > = values in this interval not displayed.    ABG Recent Labs  Lab 01/04/18 0350 01/06/18 0913 01/06/18 1656  PHART 7.442 7.484* 7.469*  PCO2ART 45.0 32.7 37.6  PO2ART 132*  95.0 456.0*    Liver Enzymes Recent Labs  Lab 01/03/18 1446 01/04/18 0548  AST 48* 47*  ALT 165* 162*  ALKPHOS 86 79  BILITOT 0.9 1.4*  ALBUMIN 2.4* 2.2*    Cardiac Enzymes Recent Labs  Lab 01/03/18 1446 01/03/18 1851 01/04/18 0217  TROPONINI 0.37* 0.33* 0.21*    Glucose Recent Labs  Lab 01/06/18 1214 01/06/18 1645 01/06/18 2015 01/07/18 0017 01/07/18 0402 01/07/18 0825  GLUCAP 247* 205* 253* 249* 311* 307*    Imaging Dg Chest Port 1 View  Result Date: 01/06/2018 CLINICAL DATA:  72 year old male with tachypnea, intubated. EXAM: PORTABLE CHEST 1 VIEW COMPARISON:  0831 hours today. FINDINGS: Portable AP semi upright view at 1511 hours. Endotracheal tube tip in good position, just below the clavicles. Enteric tube courses to the left upper quadrant in the tip is not included. Stable right PICC line. Larger lung volumes. Allowing for portable technique the lungs are clear. Mediastinal contours remain normal. No pneumothorax identified. IMPRESSION: 1. ET tube tip in good position. Enteric tube courses to the abdomen. 2.  No acute cardiopulmonary abnormality. Electronically Signed   By: Genevie Ann M.D.   On: 01/06/2018 15:30   Dg Abd Portable 1v  Result Date: 01/06/2018 CLINICAL DATA:  72 year old male with tachypnea, intubated. OG tube placed. EXAM: PORTABLE ABDOMEN - 1 VIEW COMPARISON:  Portable abdomen 12/30/2017 and earlier FINDINGS: Portable AP view at 1511 hours. The enteric tube courses to the epigastrium. The side hole projects at the level of the mid gastric body. Right renal fossa region surgical clips redemonstrated. Visualized bowel gas pattern is non obstructed. The lung bases appear clear. IMPRESSION: 1. Enteric tube placed into the stomach, side hole the level of the gastric body. 2. Negative visible bowel gas pattern. Electronically Signed   By: Genevie Ann M.D.   On: 01/06/2018 15:32   STUDIES:  CT head 12/28/2017 >>1. Bilateral subdural hematomas, right larger than  left. Focal collection of blood in the right frontoparietal region with hematocrit level, ongoing active bleeding not excluded. Short interval follow-up recommended. 2. Minimal mass effect without significant midline shift. MRI brian 4/1>>> 1. Stable right greater than left subdural hematomas since the head CT yesterday.  The right side subdural demonstrates greater lobulation and signal heterogeneity measuring between 8 and 17 mm in thickness. The smaller more homogeneous left side subdural measures 6-7 mm in thickness. 2. Stable mild intracranial mass effect with trace leftward  midline shift. Normal basilar cisterns. Trace intraventricular hemorrhage with no ventriculomegaly. 3. No superimposed acute infarct. CT head 4/2>>> 1. Stable right larger than left mixed attenuation subdural hematomas. Stable minimal associated mass effect. No herniation. 2. No new acute intracranial abnormality identified. US thyroid 4/4>>> 1.5 cm right mid thyroid TR 4 nodule meets criteria for biopsy as Above.  1.4 cm left inferior TR 3 nodule does not meet criteria for any biopsy or follow-up.   Nonspecific gland heterogeneity.  CULTURES: BC x 2 3/29>>> 1/2 coag neg staph>> suspect contaminant  BC x 2 4/1>>> neg   LINES/TUBES:  ETT 4/2>> 4/4 RUA PICC 3/30>>>  ANTIBIOTICS: Zosyn 3/30>> 4/5, restart 4/6 Vanc 3/31>>>4/3  SIGNIFICANT EVENTS: 12/30/2017 transfer to ICU for worsening resp failure and mental status with afib RVR  01/01/2018>> Extubated  4/5 >> Transfer back to ICU with similar presentation 4/9:: Reintubation for worsening obtundation  ASSESSMENT / PLAN: Altered Mental Status The patient is sedated on the ventalator once again  he will move jhis head and briefly open hius eyes when provoked. I have ordered repoeat CT head. Neuro saw the oatient and they were going to reorde EEG. My fgeeling is that thepatient may have pneumonitis with encephalopathy   Pulm  I bleieve the patient  has a resp., infx based on my above statements despite pretty unremrakable CXR. I don't believe the patient had a PE. His 02 needs are fairly minimal. I would be concerned about performing CTA with his kidney dysfn. Resp culture pending The poatient has been placed on Zosyn and Vancocin      CARDIOVASCULAR  BP is stable. the patient is in a NSR.  HTN emergency. His troponin was 0.21 on 4/7 and had peaked on 4/6 aat 0.37 and I am not overly concerned. He is in Atrial fib with heart rate 110-120  2D echo 3//30>> EF -65%, mod hypertrophy, trivial Dwayne, trivial TR, severely dilated RA and LA  The patioent developed rapid Atrial fib yesterday7 and ended up back on  A Cardizem drip.   Endocrine The patient had a brief episode of thyrotoxicosis. His free T4 is going down. TSH still remains undetectable.   SSI  I have increased lantus from 6-10 units. Blood sugar are elevated. I am going to stop steroids. We are bumping up Lantus. if this is unsatisfactory her may need an insulin drip.  Hypernatremia  Resolved  Summary:: In short we have a patient who presented originally with SDH. His hospital course was complicated by thyrotoxicosis. He developed worsening mental status in the past few days. I feel he has arespiratory infx. Nevertheless, I asked neuro to see him. We are rechecking EEG and CT scan head.

## 2018-01-07 NOTE — Progress Notes (Signed)
Inpatient Diabetes Program Recommendations  AACE/ADA: New Consensus Statement on Inpatient Glycemic Control (2015)  Target Ranges:  Prepandial:   less than 140 mg/dL      Peak postprandial:   less than 180 mg/dL (1-2 hours)      Critically ill patients:  140 - 180 mg/dL   Lab Results  Component Value Date   GLUCAP 307 (H) 01/07/2018   HGBA1C 6.1 (H) 12/29/2017    Review of Glycemic Control  Results for [name], [name] (MRN [id number]) as of 01/07/2018 08:59  Ref. Range 01/06/2018 20:15 01/07/2018 00:17 01/07/2018 04:02 01/07/2018 08:25  Glucose-Capillary Latest Ref Range: 65 - 99 mg/dL 253 (H) 249 (H) 311 (H) 307 (H)   Diabetes history:DM2 Outpatient Diabetes medications:Metformin 1000 mg BID, Actos 45 mg daily Current orders for Inpatient glycemic control:Lantus 10 Units QD, Novolog 0-20 units Q4H; Solucortef 50 mg QD  Inpatient Diabetes Program Recommendations:  In the setting of steroids, consider increasing Lantus to 20 units QD, as total daily requirements have increased to 69 units in last 24 hours. Adjust insulin as steroids are decreased. Also, while on tube feeds, consider Novolog 4 units Q4H.   Thanks, Bronson Curb, MSN, RNC-OB Diabetes Coordinator [phone] (8a-5p)

## 2018-01-07 NOTE — Progress Notes (Signed)
SLP Cancellation Note  Patient Details Name: [name] MRN: [id number] DOB: [birth year]   Cancelled treatment:       Reason Eval/Treat Not Completed: Medical issues which prohibited therapy   Juanisha Bautch, Katherene Ponto 01/07/2018, 7:50 AM

## 2018-01-07 NOTE — Progress Notes (Signed)
Patient transported to CT and back to room 4N31 without any apparent complications.

## 2018-01-07 NOTE — Progress Notes (Signed)
.   PHARMACY - PHYSICIAN COMMUNICATION CRITICAL VALUE ALERT - BLOOD CULTURE IDENTIFICATION (BCID)  [name] is an 72 y.o. male who presented to [facility] on 12/06/2017 with a chief complaint of SOB and pulmonary edema.   Assessment: BCID 2/4 CONS, likely a contaminant. Pt previously on Zosyn 3/29-4/5, and restarted Zosyn 4/6 when pt became febrile. Vancomycin restarted on 4/9. PCT remains <0.1. However, reintubated on 4/10 and suspected respiratory infection with increased thick yellow-green secretions.  Name of physician (or Provider) Contacted: Micheal Likens, MD  Current antibiotics: Zosyn/Vanc  Changes to prescribed antibiotics recommended: Recommended monitoring pt off antibiotics. However, due to worsening clinical status of patient, elected to stay on Vanc/Zosyn. Follow up on trach aspirate cultures and clinical improvement.   Results for orders placed or performed during the hospital encounter of 12/01/2017  Blood Culture ID Panel (Reflexed) (Collected: 01/06/2018  9:43 AM)  Result Value Ref Range   Enterococcus species NOT DETECTED NOT DETECTED   Listeria monocytogenes NOT DETECTED NOT DETECTED   Staphylococcus species DETECTED (A) NOT DETECTED   Staphylococcus aureus NOT DETECTED NOT DETECTED   Methicillin resistance NOT DETECTED NOT DETECTED   Streptococcus species NOT DETECTED NOT DETECTED   Streptococcus agalactiae NOT DETECTED NOT DETECTED   Streptococcus pneumoniae NOT DETECTED NOT DETECTED   Streptococcus pyogenes NOT DETECTED NOT DETECTED   Acinetobacter baumannii NOT DETECTED NOT DETECTED   Enterobacteriaceae species NOT DETECTED NOT DETECTED   Enterobacter cloacae complex NOT DETECTED NOT DETECTED   Escherichia coli NOT DETECTED NOT DETECTED   Klebsiella oxytoca NOT DETECTED NOT DETECTED   Klebsiella pneumoniae NOT DETECTED NOT DETECTED   Proteus species NOT DETECTED NOT DETECTED   Serratia marcescens NOT DETECTED NOT DETECTED   Haemophilus influenzae NOT  DETECTED NOT DETECTED   Neisseria meningitidis NOT DETECTED NOT DETECTED   Pseudomonas aeruginosa NOT DETECTED NOT DETECTED   Candida albicans NOT DETECTED NOT DETECTED   Candida glabrata NOT DETECTED NOT DETECTED   Candida krusei NOT DETECTED NOT DETECTED   Candida parapsilosis NOT DETECTED NOT DETECTED   Candida tropicalis NOT DETECTED NOT DETECTED    Karmen Stabs, PharmD Candidate 01/07/2018  11:07 AM

## 2018-01-08 ENCOUNTER — Inpatient Hospital Stay (HOSPITAL_COMMUNITY): Payer: Medicare Other

## 2018-01-08 DIAGNOSIS — G9340 Encephalopathy, unspecified: Secondary | ICD-10-CM

## 2018-01-08 LAB — GLUCOSE, CAPILLARY
GLUCOSE-CAPILLARY: 120 mg/dL — AB (ref 65–99)
GLUCOSE-CAPILLARY: 134 mg/dL — AB (ref 65–99)
GLUCOSE-CAPILLARY: 152 mg/dL — AB (ref 65–99)
GLUCOSE-CAPILLARY: 154 mg/dL — AB (ref 65–99)
GLUCOSE-CAPILLARY: 158 mg/dL — AB (ref 65–99)
GLUCOSE-CAPILLARY: 189 mg/dL — AB (ref 65–99)
GLUCOSE-CAPILLARY: 196 mg/dL — AB (ref 65–99)
GLUCOSE-CAPILLARY: 198 mg/dL — AB (ref 65–99)
GLUCOSE-CAPILLARY: 205 mg/dL — AB (ref 65–99)
GLUCOSE-CAPILLARY: 207 mg/dL — AB (ref 65–99)
Glucose-Capillary: 126 mg/dL — ABNORMAL HIGH (ref 65–99)
Glucose-Capillary: 156 mg/dL — ABNORMAL HIGH (ref 65–99)
Glucose-Capillary: 158 mg/dL — ABNORMAL HIGH (ref 65–99)
Glucose-Capillary: 161 mg/dL — ABNORMAL HIGH (ref 65–99)
Glucose-Capillary: 163 mg/dL — ABNORMAL HIGH (ref 65–99)
Glucose-Capillary: 170 mg/dL — ABNORMAL HIGH (ref 65–99)
Glucose-Capillary: 171 mg/dL — ABNORMAL HIGH (ref 65–99)
Glucose-Capillary: 174 mg/dL — ABNORMAL HIGH (ref 65–99)
Glucose-Capillary: 175 mg/dL — ABNORMAL HIGH (ref 65–99)
Glucose-Capillary: 178 mg/dL — ABNORMAL HIGH (ref 65–99)
Glucose-Capillary: 184 mg/dL — ABNORMAL HIGH (ref 65–99)
Glucose-Capillary: 187 mg/dL — ABNORMAL HIGH (ref 65–99)
Glucose-Capillary: 193 mg/dL — ABNORMAL HIGH (ref 65–99)
Glucose-Capillary: 199 mg/dL — ABNORMAL HIGH (ref 65–99)

## 2018-01-08 LAB — PROCALCITONIN

## 2018-01-08 LAB — BASIC METABOLIC PANEL
ANION GAP: 7 (ref 5–15)
BUN: 34 mg/dL — ABNORMAL HIGH (ref 6–20)
CALCIUM: 9.2 mg/dL (ref 8.9–10.3)
CO2: 23 mmol/L (ref 22–32)
Chloride: 112 mmol/L — ABNORMAL HIGH (ref 101–111)
Creatinine, Ser: 1.44 mg/dL — ABNORMAL HIGH (ref 0.61–1.24)
GFR, EST AFRICAN AMERICAN: 56 mL/min — AB (ref >60)
GFR, EST NON AFRICAN AMERICAN: 48 mL/min — AB (ref >60)
Glucose, Bld: 126 mg/dL — ABNORMAL HIGH (ref 65–99)
POTASSIUM: 3.8 mmol/L (ref 3.5–5.1)
Sodium: 142 mmol/L (ref 135–145)

## 2018-01-08 LAB — HEPATIC FUNCTION PANEL
ALT: 72 U/L — AB (ref 17–63)
AST: 19 U/L (ref 15–41)
Albumin: 2 g/dL — ABNORMAL LOW (ref 3.5–5.0)
Alkaline Phosphatase: 75 U/L (ref 38–126)
BILIRUBIN DIRECT: 0.4 mg/dL (ref 0.1–0.5)
Indirect Bilirubin: 0.5 mg/dL (ref 0.3–0.9)
Total Bilirubin: 0.9 mg/dL (ref 0.3–1.2)
Total Protein: 5.5 g/dL — ABNORMAL LOW (ref 6.5–8.1)

## 2018-01-08 LAB — CULTURE, BLOOD (ROUTINE X 2)
CULTURE: NO GROWTH
Culture: NO GROWTH
SPECIAL REQUESTS: ADEQUATE
Special Requests: ADEQUATE

## 2018-01-08 LAB — CULTURE, RESPIRATORY

## 2018-01-08 LAB — AMMONIA: AMMONIA: 22 umol/L (ref 9–35)

## 2018-01-08 MED ORDER — DILTIAZEM HCL 60 MG PO TABS
60.0000 mg | ORAL_TABLET | Freq: Three times a day (TID) | ORAL | Status: DC
  Administered 2018-01-09 – 2018-01-30 (×62): 60 mg via ORAL
  Filled 2018-01-08 (×62): qty 1

## 2018-01-08 MED ORDER — THIAMINE HCL 100 MG/ML IJ SOLN
100.0000 mg | Freq: Every day | INTRAMUSCULAR | Status: AC
  Administered 2018-01-08 – 2018-01-10 (×3): 100 mg via INTRAVENOUS
  Filled 2018-01-08 (×3): qty 2

## 2018-01-08 MED ORDER — IPRATROPIUM-ALBUTEROL 0.5-2.5 (3) MG/3ML IN SOLN
3.0000 mL | Freq: Three times a day (TID) | RESPIRATORY_TRACT | Status: DC
  Administered 2018-01-09 – 2018-01-12 (×11): 3 mL via RESPIRATORY_TRACT
  Filled 2018-01-08 (×10): qty 3

## 2018-01-08 MED ORDER — ORAL CARE MOUTH RINSE
15.0000 mL | OROMUCOSAL | Status: DC
Start: 2018-01-09 — End: 2018-01-18
  Administered 2018-01-08 – 2018-01-18 (×95): 15 mL via OROMUCOSAL

## 2018-01-08 MED ORDER — CHLORHEXIDINE GLUCONATE CLOTH 2 % EX PADS
6.0000 | MEDICATED_PAD | Freq: Every day | CUTANEOUS | Status: DC
  Administered 2018-01-09 – 2018-01-10 (×2): 6 via TOPICAL

## 2018-01-08 MED ORDER — METHYLPREDNISOLONE SODIUM SUCC 40 MG IJ SOLR
40.0000 mg | Freq: Four times a day (QID) | INTRAMUSCULAR | Status: DC
  Administered 2018-01-08 – 2018-01-10 (×8): 40 mg via INTRAVENOUS
  Filled 2018-01-08 (×9): qty 1

## 2018-01-08 MED ORDER — SODIUM CHLORIDE 0.9 % IV SOLN
1.0000 g | INTRAVENOUS | Status: DC
  Administered 2018-01-08 – 2018-01-09 (×2): 1 g via INTRAVENOUS
  Filled 2018-01-08 (×3): qty 10

## 2018-01-08 NOTE — Progress Notes (Signed)
EEG completed, results pending. 

## 2018-01-08 NOTE — Progress Notes (Addendum)
Subjective: Sedated and intubated.  Opens eyes to sternal rub, but does not follow any commands.  Patient will withdrawal from noxious stimuli in all 4 extremities.  Will look from left to right but has a gaze preference to the right however family members are also standing at the right.  When attempting to look at pupils he clenches his eyelids shut.  No clinical seizures noted.  Breathing over the vent.  . Objective: Current vital signs: BP 137/80   Pulse (!) 111   Temp 99.5 F (37.5 C) (Axillary)   Resp (!) 23   Ht 5' 9" (1.753 m)   Wt 112.6 kg (248 lb 3.8 oz)   SpO2 100%   BMI 36.66 kg/m  Vital signs in last 24 hours: Temp:  [99.5 F (37.5 C)-100.4 F (38 C)] 99.5 F (37.5 C) (04/11 0753) Pulse Rate:  [97-132] 111 (04/11 0800) Resp:  [17-34] 23 (04/11 0800) BP: (124-164)/(69-110) 137/80 (04/11 0800) SpO2:  [99 %-100 %] 100 % (04/11 0800) FiO2 (%):  [30 %-40 %] 30 % (04/11 0729) Weight:  [112.6 kg (248 lb 3.8 oz)] 112.6 kg (248 lb 3.8 oz) (04/11 0405)  Intake/Output from previous day: 04/10 0701 - 04/11 0700 In: 4163.1 [I.V.:2542.9; NG/GT:710.3; IV Piggyback:550] Out: 1595 [Urine:1185; Stool:410] Intake/Output this shift: Total I/O In: 121.4 [I.V.:91.4; NG/GT:30] Out: -  Nutritional status: Diet NPO time specified  Neurologic Exam: Unable to assess patient's cognition as he is sedated on propofol and intubated. Pupils equal round and reactive to light and accommodation. Face flaccidly symmetric.No response to voice. Normal tone in all 4 extremties without asymmetry.  2+ reflexes in the upper extremities, 1+ reflex in the right knee, no reflex noted in the left knee. Patient with downgoing toes bilaterally.  Patient does appear slightly jaundiced today  Lab Results: Thiamine  Level still pending  Recent Results (from the past 240 hour(s))  Culture, blood (Routine X 2) w Reflex to ID Panel     Status: None   Collection Time: 12/29/17 11:54 AM  Result Value Ref Range  Status   Specimen Description BLOOD BLOOD LEFT HAND  Final   Special Requests   Final    BOTTLES DRAWN AEROBIC ONLY Blood Culture adequate volume   Culture   Final    NO GROWTH 5 DAYS Performed at [facility], [address] [place], [place] [address]    Report Status 01/03/2018 FINAL  Final  Culture, blood (Routine X 2) w Reflex to ID Panel     Status: None   Collection Time: 12/29/17 11:57 AM  Result Value Ref Range Status   Specimen Description BLOOD BLOOD LEFT HAND  Final   Special Requests   Final    BOTTLES DRAWN AEROBIC ONLY Blood Culture adequate volume   Culture   Final    NO GROWTH 5 DAYS Performed at [facility], [place] [address] [place], [place] [address]    Report Status 01/03/2018 FINAL  Final  Culture, blood (Routine X 2) w Reflex to ID Panel     Status: None (Preliminary result)   Collection Time: 01/03/18  1:17 PM  Result Value Ref Range Status   Specimen Description BLOOD RIGHT ANTECUBITAL  Final   Special Requests   Final    BOTTLES DRAWN AEROBIC ONLY Blood Culture adequate volume   Culture   Final    NO GROWTH 4 DAYS Performed at [facility], [place] [address], [place], [place] [address]  Report Status PENDING  Incomplete  Culture, blood (Routine X 2) w Reflex to ID Panel     Status: None (Preliminary result)   Collection Time: 01/03/18  1:20 PM  Result Value Ref Range Status   Specimen Description BLOOD RIGHT ANTECUBITAL  Final   Special Requests   Final    BOTTLES DRAWN AEROBIC ONLY Blood Culture adequate volume   Culture   Final    NO GROWTH 4 DAYS Performed at [facility], [address] [place], [place] [address]    Report Status PENDING  Incomplete  Culture, blood (routine x 2)     Status: None (Preliminary result)   Collection Time: 01/06/18  9:32 AM  Result Value Ref Range Status   Specimen Description BLOOD LEFT ANTECUBITAL  Final   Special Requests   Final    BOTTLES DRAWN AEROBIC AND ANAEROBIC Blood Culture  adequate volume   Culture   Final    NO GROWTH 1 DAY Performed at [facility], [place] [address] [place], [place] [address]    Report Status PENDING  Incomplete  Culture, blood (routine x 2)     Status: Abnormal (Preliminary result)   Collection Time: 01/06/18  9:43 AM  Result Value Ref Range Status   Specimen Description BLOOD HAND LEFT  Final   Special Requests   Final    BOTTLES DRAWN AEROBIC AND ANAEROBIC Blood Culture adequate volume   Culture  Setup Time   Final    GRAM POSITIVE COCCI IN BOTH AEROBIC AND ANAEROBIC BOTTLES Organism ID to follow CRITICAL RESULT CALLED TO, READ BACK BY AND VERIFIED WITH: Francene Boyers PharmD 10:45 01/07/18 ([email or web address])    Culture (A)  Final    STAPHYLOCOCCUS SPECIES (COAGULASE NEGATIVE) THE SIGNIFICANCE OF ISOLATING THIS ORGANISM FROM A SINGLE SET OF BLOOD CULTURES WHEN MULTIPLE SETS ARE DRAWN IS UNCERTAIN. PLEASE NOTIFY THE MICROBIOLOGY DEPARTMENT WITHIN ONE WEEK IF SPECIATION AND SENSITIVITIES ARE REQUIRED. Performed at [facility], [place] [address], [place], [place] [address]    Report Status PENDING  Incomplete  Blood Culture ID Panel (Reflexed)     Status: Abnormal   Collection Time: 01/06/18  9:43 AM  Result Value Ref Range Status   Enterococcus species NOT DETECTED NOT DETECTED Final   Listeria monocytogenes NOT DETECTED NOT DETECTED Final   Staphylococcus species DETECTED (A) NOT DETECTED Final    Comment: Methicillin (oxacillin) susceptible coagulase negative staphylococcus. Possible blood culture contaminant (unless isolated from more than one blood culture draw or clinical case suggests pathogenicity). No antibiotic treatment is indicated for blood  culture contaminants. CRITICAL RESULT CALLED TO, READ BACK BY AND VERIFIED WITH: Francene Boyers PharmD 10:45 01/07/18 ([email or web address])    Staphylococcus aureus NOT DETECTED NOT DETECTED Final   Methicillin resistance NOT DETECTED NOT DETECTED Final   Streptococcus species NOT DETECTED NOT DETECTED  Final   Streptococcus agalactiae NOT DETECTED NOT DETECTED Final   Streptococcus pneumoniae NOT DETECTED NOT DETECTED Final   Streptococcus pyogenes NOT DETECTED NOT DETECTED Final   Acinetobacter baumannii NOT DETECTED NOT DETECTED Final   Enterobacteriaceae species NOT DETECTED NOT DETECTED Final   Enterobacter cloacae complex NOT DETECTED NOT DETECTED Final   Escherichia coli NOT DETECTED NOT DETECTED Final   Klebsiella oxytoca NOT DETECTED NOT DETECTED Final   Klebsiella pneumoniae NOT DETECTED NOT DETECTED Final   Proteus species NOT DETECTED NOT DETECTED Final   Serratia marcescens NOT DETECTED NOT DETECTED Final   Haemophilus influenzae NOT DETECTED NOT DETECTED  Final   Neisseria meningitidis NOT DETECTED NOT DETECTED Final   Pseudomonas aeruginosa NOT DETECTED NOT DETECTED Final   Candida albicans NOT DETECTED NOT DETECTED Final   Candida glabrata NOT DETECTED NOT DETECTED Final   Candida krusei NOT DETECTED NOT DETECTED Final   Candida parapsilosis NOT DETECTED NOT DETECTED Final   Candida tropicalis NOT DETECTED NOT DETECTED Final    Comment: Performed at [facility], [place] [address] [place], [place] [address]  Culture, respiratory (NON-Expectorated)     Status: None (Preliminary result)   Collection Time: 01/06/18  3:10 PM  Result Value Ref Range Status   Specimen Description TRACHEAL ASPIRATE  Final   Special Requests NONE  Final   Gram Stain   Final    ABUNDANT WBC PRESENT,BOTH PMN AND MONONUCLEAR MODERATE GRAM NEGATIVE RODS FEW GRAM POSITIVE COCCI    Culture   Final    MODERATE KLEBSIELLA PNEUMONIAE SUSCEPTIBILITIES TO FOLLOW Performed at [facility], [place] [address] [place], [place] [address]    Report Status PENDING  Incomplete    Lipid Panel Recent Labs    01/06/18 1537  TRIG 213*    Studies/Results: Ct Head Wo Contrast  Result Date: 01/07/2018 CLINICAL DATA:  Altered mental status EXAM: CT HEAD WITHOUT CONTRAST TECHNIQUE: Contiguous  axial images were obtained from the base of the skull through the vertex without intravenous contrast. COMPARISON:  Head CT dated 01/03/2018. FINDINGS: Brain: The RIGHT-sided subdural hematoma does not appear significantly changed in size or extent compared to the CT of 01/03/2018, again overlying the RIGHT hemisphere and extending along the falx and RIGHT tentorium. No appreciable mass effect or midline shift. No new extra-axial hemorrhage. No parenchymal hemorrhage or evidence of parenchymal edema. Mild chronic small vessel ischemic changes noted within the periventricular white matter. No hydrocephalus. Vascular: There are chronic calcified atherosclerotic changes of the large vessels at the skull base. No unexpected hyperdense vessel. Skull: Normal. Negative for fracture or focal lesion. Sinuses/Orbits: Fluid/mucosal thickening within the RIGHT sphenoid sinus. Otherwise clear. Periorbital and retro-orbital soft tissues are unremarkable. Other: None. IMPRESSION: RIGHT hemispheric subdural hematoma appears stable compared to the earlier CT of 01/03/2018. No new area of intracranial hemorrhage. No parenchymal edema or mass effect. Electronically Signed   By: Franki Cabot M.D.   On: 01/07/2018 11:10      Medications: Current Facility-Administered Medications:  .  0.9 %  sodium chloride infusion, , Intravenous, Continuous, Aljishi, Virgina Norfolk, MD, Last Rate: 10 mL/hr at 01/06/18 1800 .  0.9 %  sodium chloride infusion, , Intravenous, Continuous, Tarry Kos, MD, Last Rate: 75 mL/hr at 01/07/18 1712 .  [DISCONTINUED] acetaminophen (TYLENOL) tablet 650 mg, 650 mg, Oral, Q6H PRN, 650 mg at 12/27/17 1959 **OR** acetaminophen (TYLENOL) suppository 650 mg, 650 mg, Rectal, Q6H PRN, Truett Mainland, DO, 650 mg at 01/06/18 0844 .  chlorhexidine gluconate (MEDLINE KIT) (PERIDEX) 0.12 % solution 15 mL, 15 mL, Mouth Rinse, BID, Tarry Kos, MD, 15 mL at 01/08/18 0900 .  [START ON 01/09/2018]  Chlorhexidine Gluconate Cloth 2 % PADS 6 each, 6 each, Topical, Daily, Mannam, Praveen, MD .  dextrose 5 %-0.45 % sodium chloride infusion, , Intravenous, Continuous, Tarry Kos, MD, Last Rate: 50 mL/hr at 01/07/18 2127 .  dextrose 50 % solution 25 mL, 25 mL, Intravenous, PRN, Tarry Kos, MD .  diltiazem (CARDIZEM) 100 mg in dextrose 5 % 100 mL (1 mg/mL) infusion, 5-15 mg/hr, Intravenous, Titrated, Tarry Kos, MD,  Last Rate: 12.5 mL/hr at 01/08/18 0800, 12.5 mg/hr at 01/08/18 0800 .  diltiazem (CARDIZEM) tablet 60 mg, 60 mg, Oral, Q8H, Tarry Kos, MD .  feeding supplement (PRO-STAT SUGAR FREE 64) liquid 60 mL, 60 mL, Per Tube, 5 X Daily, Tarry Kos, MD, 60 mL at 01/08/18 0910 .  feeding supplement (VITAL HIGH PROTEIN) liquid 1,000 mL, 1,000 mL, Per Tube, Continuous, Tarry Kos, MD, Last Rate: 15 mL/hr at 01/07/18 2000 .  haloperidol lactate (HALDOL) injection 5 mg, 5 mg, Intravenous, Q6H PRN, Manuella Ghazi, Pratik D, DO, 5 mg at 01/05/18 0307 .  hydrALAZINE (APRESOLINE) injection 10 mg, 10 mg, Intravenous, Q6H PRN, Dhungel, Nishant, MD, 10 mg at 01/06/18 0709 .  insulin regular (NOVOLIN R,HUMULIN R) 100 Units in sodium chloride 0.9 % 100 mL (1 Units/mL) infusion, , Intravenous, Continuous, Tarry Kos, MD, Last Rate: 5.5 mL/hr at 01/08/18 0834, 5.5 Units/hr at 01/08/18 0834 .  ipratropium-albuterol (DUONEB) 0.5-2.5 (3) MG/3ML nebulizer solution 3 mL, 3 mL, Nebulization, Q6H WA, Tarry Kos, MD, 3 mL at 01/08/18 0726 .  lactated ringers infusion, , Intravenous, Continuous, Tarry Kos, MD, Stopped at 01/07/18 1712 .  levalbuterol (XOPENEX) nebulizer solution 0.63 mg, 0.63 mg, Nebulization, Q2H PRN, Rai, Ripudeep K, MD .  levETIRAcetam (KEPPRA) IVPB 1000 mg/100 mL premix, 1,000 mg, Intravenous, Q12H, Dhungel, Nishant, MD, Stopped at 01/08/18 0434 .  LORazepam (ATIVAN) injection 2 mg, 2 mg, Intravenous, Q4H PRN, Dhungel,  Nishant, MD .  MEDLINE mouth rinse, 15 mL, Mouth Rinse, QID, Tarry Kos, MD, 15 mL at 01/08/18 0429 .  methimazole (TAPAZOLE) tablet 20 mg, 20 mg, Per Tube, Q6H, Magdalen Spatz, NP, 20 mg at 01/08/18 0910 .  multivitamin liquid 15 mL, 15 mL, Per Tube, Daily, Tarry Kos, MD, 15 mL at 01/08/18 0910 .  ondansetron (ZOFRAN) tablet 4 mg, 4 mg, Oral, Q6H PRN **OR** ondansetron (ZOFRAN) injection 4 mg, 4 mg, Intravenous, Q6H PRN, Truett Mainland, DO .  pantoprazole (PROTONIX) injection 40 mg, 40 mg, Intravenous, Q24H, Anders Simmonds, MD, 40 mg at 01/08/18 0130 .  piperacillin-tazobactam (ZOSYN) IVPB 3.375 g, 3.375 g, Intravenous, Q8H, Mannam, Praveen, MD, Last Rate: 12.5 mL/hr at 01/08/18 0611, 3.375 g at 01/08/18 0611 .  potassium chloride 20 MEQ/15ML (10%) solution 40 mEq, 40 mEq, Per Tube, BID, Chesley Mires, MD, 40 mEq at 01/08/18 0910 .  propofol (DIPRIVAN) 1000 MG/100ML infusion, 5-80 mcg/kg/min, Intravenous, Titrated, Tarry Kos, MD, Last Rate: 6.6 mL/hr at 01/08/18 0828, 10 mcg/kg/min at 01/08/18 0828 .  sodium chloride flush (NS) 0.9 % injection 10-40 mL, 10-40 mL, Intracatheter, Q12H, Dhungel, Nishant, MD, 10 mL at 01/07/18 2211 .  sodium chloride flush (NS) 0.9 % injection 10-40 mL, 10-40 mL, Intracatheter, PRN, Dhungel, Nishant, MD, 10 mL at 12/29/17 2156 .  thiamine (B-1) injection 100 mg, 100 mg, Intravenous, Daily, Tarry Kos, MD .  vancomycin (VANCOCIN) 1,250 mg in sodium chloride 0.9 % 250 mL IVPB, 1,250 mg, Intravenous, Q24H, Romona Curls, [facility], Stopped at 01/07/18 1635  EEG 01/04/18: Mild generalized irregular slow activity. Clinical Interpretation: This EEG is consistent with a mild generalized non-specific cerebral dysfunction(encephalopathy). There was no seizure or seizure predisposition recorded on this study. Please note that a normal EEG does not preclude the possibility of epilepsy.  EEG 4/11: Description: The patient is awake and drowsy  during the recording, intubated on Propofol. He is able to follow simple commands. During maximal wakefulness, there is a poorly  sustained symmetric, medium voltage 8 Hz posterior dominant rhythm that poorly attenuates with eye opening. This is admixed with a moderate amount of diffuse 4-5 Hz theta and 2-3 Hz delta slowing of the background. The background consists of diffuse theta and delta slowing admixed with diffuse alpha and beta activity. There is an increase in faster frequencies with stimulation. Normal sleep architecture is not seen.Hyperventilation and photic stimulation were not performed.  There were no epileptiform discharges or electrographic seizures seen. EKG lead showed tachycardia with irregular rhythm. Impression: This sedated EEG is abnormal due to moderate diffuse background slowing. Clinical Correlation of the above findings indicates diffuse cerebral dysfunction that is non-specific in etiology and can be seen with hypoxic/ischemic injury, toxic/metabolic encephalopathies, neurodegenerative disorders, or medication effect from Propofol.  The absence of epileptiform discharges does not rule out a clinical diagnosis of epilepsy.  Clinical correlation is advised.  Assessment Mr Meiring is a 72 yr old male with history of a fib, stage 3 chronic kidney disease, DM2, adenocarcinoma of colon s/p resection and current colostomy, history of renal cell carcinoma s/o resection who was admitted on 12/25/2017 for shortnes s of breath, supratherputic INR,  afib with RVR and pulm edema. During his stay he was diagnosed with thyrotoxicosis, bilateral SDH and seizure. On 4/2 PCCM consulted for increased work of breathing, hypoxia, AMS.  He was intubated and tx to ICU.  He was transferred out of the ICU on 01/02/2018 and sent back to the ICU on 01/03/2018 when he became acutely dyspneic with tachypnea.  Patient was started on BiPAP and became comfortable, more awake and became more interactive. Had to be  reintubated again on 01/06/2018.   1.  Multifactorial encephalopathy-initially thought to be metabolic or alcoholic in pt with bilateral subdural hematoma.  Patient has become more obtunded with some aggression. The persistence of his AMS was concerning despite aggressive management of metabolic conditions. Patient has not had any visible seizures. CT of the head on 01/03/2018 showed continued improvement right hemispheric subdural hematoma, with no new area of hemorrhage or infarction.  Repeat CT on 01/07/2018 showed RIGHT hemispheric subdural hematoma appears stable compared to the earlier CT of 01/03/2018. No new area of intracranial hemorrhage. No parenchymal edema or mass effect.  Initial EEG did not show evidence of ongoing seizure. Follow up EEG also reveals no seizure but is clearly abnormal and c/w a diffuse encephalopathy. The follow up EEG shows worsening from prior, with encephalopathic findings now rated as moderate relative to the mild encephalopathy seen on the intial EEG. DDx includes hypoxic/ischemic injury, toxic/metabolic encephalopathies and neurodegenerative disorders (the  latter is unlikely as the encephalopathy is subacute). Propofol could also result in EEG changes but at the dose he is on (rate of 10) would not expect the degree of encephalopathy seen on EEG.  2.  A. fib with RVR currently on Cardizem drip 3. Bilateral Subdural Hematoma 4. Hyperthyroidism 5. Seizure  Plan: - Continue aggressive management of metabolic problems - Patient is currently intubated and sedated on propofol-Neurology to perform more accurate neuro exam when patient is off sedation and possibly extubated --Repeat CT did not show any change in subdural hematoma no acute abnormalities. - Patient with a history of alcohol abuse, check thiamine and possibly start banana bag pending results. - Continue Keppra for seizures. - Repeat EEG currently underway awaiting EEG formal reading -Patient appears slightly  jaundiced today thus will order LFTs and ammonia -Repeat MRI brain with and without contrast (ordered) to determine if  there has been interval change relative to the MRI performed 10 days ago. -If repeat imaging is unrevealing, will need to have LP    LOS: 13 days   _0  signed:  Etta Quill 9:58 AM 01/06/18  35 minutes spent in the neurological evaluation and management of this critically ill patient.  Electronically signed: Dr. Kerney Elbe

## 2018-01-08 NOTE — Progress Notes (Signed)
PULMONARY / CRITICAL CARE MEDICINE   Name: [name] MRN: [id number] DOB: 1945/11/23    ADMISSION DATE:  12/19/2017 CONSULTATION DATE: 12/30/2017  REFERRING MD:  Karie Kirks  CHIEF COMPLAINT:  Shortness of breath   HISTORY OF PRESENT ILLNESS:   Mr [name] is a 72 yr old male with history of a fib, stage 3 chronic kidney disease, DM2, adenocarcinoma of colon s/p resection and current colostomy, history of renal cell carcinoma s/o resection who was admitted on 12/03/2017 for shortnes s of breath, supratherputic INR,  afib with RVR and pulm edema. During his stay he was diagnosed with thyrotoxicosis and bilateral SDH and seizure. On 4/2 PCCM consulted for increased work of breathing, hypoxia, AMS.  He was intubated and tx to ICU.      Recent Events Extubated.  Transferred out of ICU on 4/5 Called back on 4/6 as he became acutely dyspneic with tachypnea Chest x-ray is better than before and ABG shows mild alkalosis  4/7 The patient is awake and appears relatively comfortable. He remains on BIPAP. His CXR was fairly clear. ABG shows a pH of 7.44/ PacO2 45/Pa02  132  4/8 Patient opens eyes but noit trying to speak this AM. He is on  ansal cannula and maintaining his 02 sats.  4/9 The patient remains obtunded. He is a bit tachypneic as well.  He has a low grade temp of about 100. His wife is fairly concerned aboiut the patient. His last CT scan of 4/6 actually appeared improved and his TFTs appeared recently to be trending in the right direction.  4/10 The patient became progressively obtunded yesterday. We intubated him in the early afternoon. I asked neurolgy to weigh in. It appears to be atoxic-metabolic issue. The patient amy have a respiratory infx. There were thoick green-yellow secretions on suctionoing and visualized in the beck of the pharynx on intubation. The patient had been running a low grade temp as well.  4/11 The patient is sedated on the ventilator. Currently getting  an EEG. He is hemodynamically stable. His heart rate is still 110-120.  I am going to restart po Cardizem. We had to place thepatient on an insulin drip as his BS were consistently in the high 200-300s.   VITAL SIGNS: BP 137/80   Pulse (!) 111   Temp 99.5 F (37.5 C) (Axillary)   Resp (!) 23   Ht 5\' 9"  (1.753 m)   Wt 248 lb 3.8 oz (112.6 kg)   SpO2 100%   BMI 36.66 kg/m        INTAKE / OUTPUT: I/O last 3 completed shifts: In: 6621.5 [I.V.:4011.3; Other:420; NG/GT:1490.3; IV [id number]:244] Out: 2090 [Urine:1505; Stool:585]  PHYSICAL EXAMINATION: Gen:      No acute distress. [name] on the ventialtor HEENT:  EOMI, sclera anicteric. NGT in left nare Neck:     No masses; no thyromegaly Lungs:    Clear to auscultation bilaterally; normal respiratory effort CV:         Regular rate and rhythm; no murmurs Abd:      Obese,+ bowel sounds; soft, non-tender; no palpable masses, no distension Ext:    No edema; adequate peripheral perfusion Skin:      Warm and dry; no rash Neuro:  Will open eyes when coaxed. Moved RUE but not the left. woiill reassess when he is more awake.  LABS:  BMET Recent Labs  Lab 01/05/18 1029 01/05/18 1652 01/08/18 0421  NA 140 141 142  K 3.7 3.6 3.8  CL 102 107 112*  CO2 25 27 23   BUN 24* 25* 34*  CREATININE 1.31* 1.27* 1.44*  GLUCOSE 277* 269* 126*    Electrolytes Recent Labs  Lab 01/01/18 1819  01/04/18 0548  01/05/18 1029 01/05/18 1652 01/08/18 0421  CALCIUM  --    < > 9.4   < > 9.6 9.5 9.2  MG 2.2  --  1.8  --   --   --   --   PHOS 3.4  --  3.0  --   --   --   --    < > = values in this interval not displayed.    CBC Recent Labs  Lab 01/03/18 0311 01/03/18 1446 01/06/18 0838  WBC 11.4* 13.5* 13.9*  HGB 10.2* 10.5* 10.6*  HCT 34.4* 36.0* 34.7*  PLT 357 380 323    Coag's No results for input(s): APTT, INR in the last 168 hours.  Sepsis Markers Recent Labs  Lab 01/03/18 1446 01/03/18 1851  01/06/18 0838  01/07/18 0434 01/08/18 0421  LATICACIDVEN 1.9 1.8  --   --   --   --   PROCALCITON <0.10  --    < > <0.10 <0.10 <0.10   < > = values in this interval not displayed.    ABG Recent Labs  Lab 01/04/18 0350 01/06/18 0913 01/06/18 1656  PHART 7.442 7.484* 7.469*  PCO2ART 45.0 32.7 37.6  PO2ART 132* 95.0 456.0*    Liver Enzymes Recent Labs  Lab 01/03/18 1446 01/04/18 0548  AST 48* 47*  ALT 165* 162*  ALKPHOS 86 79  BILITOT 0.9 1.4*  ALBUMIN 2.4* 2.2*    Cardiac Enzymes Recent Labs  Lab 01/03/18 1446 01/03/18 1851 01/04/18 0217  TROPONINI 0.37* 0.33* 0.21*    Glucose Recent Labs  Lab 01/08/18 0402 01/08/18 0501 01/08/18 0559 01/08/18 0659 01/08/18 0828 01/08/18 0906  GLUCAP 158* 126* 120* 163* 175* 187*    Imaging Ct Head Wo Contrast  Result Date: 01/07/2018 CLINICAL DATA:  Altered mental status EXAM: CT HEAD WITHOUT CONTRAST TECHNIQUE: Contiguous axial images were obtained from the base of the skull through the vertex without intravenous contrast. COMPARISON:  Head CT dated 01/03/2018. FINDINGS: Brain: The RIGHT-sided subdural hematoma does not appear significantly changed in size or extent compared to the CT of 01/03/2018, again overlying the RIGHT hemisphere and extending along the falx and RIGHT tentorium. No appreciable mass effect or midline shift. No new extra-axial hemorrhage. No parenchymal hemorrhage or evidence of parenchymal edema. Mild chronic small vessel ischemic changes noted within the periventricular white matter. No hydrocephalus. Vascular: There are chronic calcified atherosclerotic changes of the large vessels at the skull base. No unexpected hyperdense vessel. Skull: Normal. Negative for fracture or focal lesion. Sinuses/Orbits: Fluid/mucosal thickening within the RIGHT sphenoid sinus. Otherwise clear. Periorbital and retro-orbital soft tissues are unremarkable. Other: None. IMPRESSION: RIGHT hemispheric subdural hematoma appears stable compared  to the earlier CT of 01/03/2018. No new area of intracranial hemorrhage. No parenchymal edema or mass effect. Electronically Signed   By: Franki Cabot M.D.   On: 01/07/2018 11:10   STUDIES:  CT head 12/28/2017 >>1. Bilateral subdural hematomas, right larger than left. Focal collection of blood in the right frontoparietal region with hematocrit level, ongoing active bleeding not excluded. Short interval follow-up recommended. 2. Minimal mass effect without significant midline shift. MRI brian 4/1>>> 1. Stable right greater than left subdural hematomas since the head CT yesterday.  The right side subdural demonstrates greater lobulation and signal heterogeneity  measuring between 8 and 17 mm in thickness. The smaller more homogeneous left side subdural measures 6-7 mm in thickness. 2. Stable mild intracranial mass effect with trace leftward midline shift. Normal basilar cisterns. Trace intraventricular hemorrhage with no ventriculomegaly. 3. No superimposed acute infarct. CT head 4/2>>> 1. Stable right larger than left mixed attenuation subdural hematomas. Stable minimal associated mass effect. No herniation. 2. No new acute intracranial abnormality identified. US thyroid 4/4>>> 1.5 cm right mid thyroid TR 4 nodule meets criteria for biopsy as Above.  1.4 cm left inferior TR 3 nodule does not meet criteria for any biopsy or follow-up.   Nonspecific gland heterogeneity.  CULTURES: BC x 2 3/29>>> 1/2 coag neg staph>> suspect contaminant  BC x 2 4/1>>> neg   LINES/TUBES:  ETT 4/2>> 4/4 RUA PICC 3/30>>>  ANTIBIOTICS: Zosyn 3/30>> 4/5, restart 4/6 Vanc 3/31>>>4/3  SIGNIFICANT EVENTS: 12/30/2017 transfer to ICU for worsening resp failure and mental status with afib RVR  01/01/2018>> Extubated  4/5 >> Transfer back to ICU with similar presentation 4/9:: Reintubation for worsening obtundation  ASSESSMENT / PLAN: Altered Mental Status The patient is sedated on the ventalator once again   he will move his head and briefly open hius eyes when provoked.  Repeat CT head did not show a significant change in the size or extent of the subdural hematomas.  Patient getting EEG presently. Will reassesss patient after stopping the propofol.   Pulm/Infx I bleieve the patient has a resp., infx based on my above statements despite pretty unremrakable CXR. I don't believe the patient had a PE. His 02 needs are fairly minimal. I would be concerned about performing CTA with his kidney dysfn. Resp culture pending The poatient has been placed on Zosyn and Vancocin. The patient grew out coag neg staph in the blood. It likely represents a contaminant. He did grow out Klebsiella On in his sputum, sensitivities pending      CARDIOVASCULAR  BP is stable. the patient is in a NSR.  HTN emergency. His troponin was 0.21 on 4/7 and had peaked on 4/6 aat 0.37 and I am not overly concerned. He is in Atrial fib with heart rate 110-120  2D echo 3//30>> EF -65%, mod hypertrophy, trivial MR, trivial TR, severely dilated RA and LA  The patioent developed rapid Atrial fib yesterday7 and ended up back on  A Cardizem drip. Last ECHO though limited in scope dod not show any evidence of valvualr vegetations  Endocrine The patient had a brief episode of thyrotoxicosis. His free T4 is going down. TSH still remains undetectable.   SSI  I have increased lantus from 6-10 units. Blood sugar are elevated. I am going to stop steroids. We are bumping up Lantus. if this is unsatisfactory her may need an insulin drip.  Hypernatremia  Resolved  Summary:: In short we have a patient who presented originally with SDH. His hospital course was complicated by thyrotoxicosis. He developed worsening mental status in the past few days. I feel he has a respiratory infx. I am hopeful that once we have taken off sedation patient will be more repsonsive at this point. If not we may need to repeat his MRI and I will discuss  further with neurology.

## 2018-01-08 NOTE — Procedures (Signed)
ELECTROENCEPHALOGRAM REPORT  Date of Study: 01/08/2018  Patient's Name: [name] MRN: [id number] Date of Birth: January 07, 1946  Referring Provider: Dr. Kerney Elbe  Clinical History: This is a 72 year old man with altered mental status  Medications: Propofol  Technical Summary: A multichannel digital EEG recording measured by the international 10-20 system with electrodes applied with paste and impedances below 5000 ohms performed as portable with EKG monitoring in an awake and drowsy patient sedated on Propofol.  Hyperventilation and photic stimulation were not performed.  The digital EEG was referentially recorded, reformatted, and digitally filtered in a variety of bipolar and referential montages for optimal display.   Description: The patient is awake and drowsy during the recording, intubated on Propofol. He is able to follow simple commands. During maximal wakefulness, there is a poorly sustained symmetric, medium voltage 8 Hz posterior dominant rhythm that poorly attenuates with eye opening. This is admixed with a moderate amount of diffuse 4-5 Hz theta and 2-3 Hz delta slowing of the background. The background consists of diffuse theta and delta slowing admixed with diffuse alpha and beta activity. There is an increase in faster frequencies with stimulation. Normal sleep architecture is not seen.Hyperventilation and photic stimulation were not performed.  There were no epileptiform discharges or electrographic seizures seen.    EKG lead showed tachycardia with irregular rhythm.  Impression: This sedated EEG is abnormal due to moderate diffuse background slowing.  Clinical Correlation of the above findings indicates diffuse cerebral dysfunction that is non-specific in etiology and can be seen with hypoxic/ischemic injury, toxic/metabolic encephalopathies, neurodegenerative disorders, or medication effect from Propofol.  The absence of epileptiform discharges does not rule out a  clinical diagnosis of epilepsy.  Clinical correlation is advised.   Ellouise Newer, M.D.

## 2018-01-09 LAB — CBC WITH DIFFERENTIAL/PLATELET
BASOS ABS: 0 10*3/uL (ref 0.0–0.1)
BLASTS: 0 %
Band Neutrophils: 0 %
Basophils Relative: 0 %
Eosinophils Absolute: 0 10*3/uL (ref 0.0–0.7)
Eosinophils Relative: 0 %
HEMATOCRIT: 28.6 % — AB (ref 39.0–52.0)
HEMOGLOBIN: 8.8 g/dL — AB (ref 13.0–17.0)
Lymphocytes Relative: 2 %
Lymphs Abs: 0.4 10*3/uL — ABNORMAL LOW (ref 0.7–4.0)
MCH: 26.8 pg (ref 26.0–34.0)
MCHC: 30.8 g/dL (ref 30.0–36.0)
MCV: 87.2 fL (ref 78.0–100.0)
METAMYELOCYTES PCT: 0 %
MONOS PCT: 0 %
MYELOCYTES: 0 %
Monocytes Absolute: 0 10*3/uL — ABNORMAL LOW (ref 0.1–1.0)
NEUTROS ABS: 20.7 10*3/uL — AB (ref 1.7–7.7)
Neutrophils Relative %: 98 %
Other: 0 %
PROMYELOCYTES RELATIVE: 0 %
Platelets: 266 10*3/uL (ref 150–400)
RBC: 3.28 MIL/uL — AB (ref 4.22–5.81)
RDW: 16.6 % — ABNORMAL HIGH (ref 11.5–15.5)
WBC: 21.1 10*3/uL — AB (ref 4.0–10.5)
nRBC: 0 /100 WBC

## 2018-01-09 LAB — GLUCOSE, CAPILLARY
GLUCOSE-CAPILLARY: 127 mg/dL — AB (ref 65–99)
GLUCOSE-CAPILLARY: 141 mg/dL — AB (ref 65–99)
GLUCOSE-CAPILLARY: 162 mg/dL — AB (ref 65–99)
GLUCOSE-CAPILLARY: 166 mg/dL — AB (ref 65–99)
GLUCOSE-CAPILLARY: 171 mg/dL — AB (ref 65–99)
GLUCOSE-CAPILLARY: 174 mg/dL — AB (ref 65–99)
GLUCOSE-CAPILLARY: 178 mg/dL — AB (ref 65–99)
GLUCOSE-CAPILLARY: 180 mg/dL — AB (ref 65–99)
GLUCOSE-CAPILLARY: 188 mg/dL — AB (ref 65–99)
GLUCOSE-CAPILLARY: 195 mg/dL — AB (ref 65–99)
GLUCOSE-CAPILLARY: 199 mg/dL — AB (ref 65–99)
Glucose-Capillary: 173 mg/dL — ABNORMAL HIGH (ref 65–99)
Glucose-Capillary: 139 mg/dL — ABNORMAL HIGH (ref 65–99)
Glucose-Capillary: 155 mg/dL — ABNORMAL HIGH (ref 65–99)
Glucose-Capillary: 175 mg/dL — ABNORMAL HIGH (ref 65–99)
Glucose-Capillary: 189 mg/dL — ABNORMAL HIGH (ref 65–99)
Glucose-Capillary: 186 mg/dL — ABNORMAL HIGH (ref 65–99)
Glucose-Capillary: 164 mg/dL — ABNORMAL HIGH (ref 65–99)
Glucose-Capillary: 164 mg/dL — ABNORMAL HIGH (ref 65–99)
Glucose-Capillary: 170 mg/dL — ABNORMAL HIGH (ref 65–99)
Glucose-Capillary: 184 mg/dL — ABNORMAL HIGH (ref 65–99)
Glucose-Capillary: 156 mg/dL — ABNORMAL HIGH (ref 65–99)
Glucose-Capillary: 154 mg/dL — ABNORMAL HIGH (ref 65–99)

## 2018-01-09 LAB — BLOOD GAS, ARTERIAL
ACID-BASE DEFICIT: 2.7 mmol/L — AB (ref 0.0–2.0)
Bicarbonate: 21.9 mmol/L (ref 20.0–28.0)
DRAWN BY: 364961
FIO2: 40
MECHVT: 570 mL
O2 SAT: 99 %
PEEP/CPAP: 5 cmH2O
PH ART: 7.363 (ref 7.350–7.450)
PO2 ART: 137 mmHg — AB (ref 83.0–108.0)
Patient temperature: 99
RATE: 14 resp/min
pCO2 arterial: 39.6 mmHg (ref 32.0–48.0)

## 2018-01-09 LAB — BASIC METABOLIC PANEL
Anion gap: 7 (ref 5–15)
BUN: 45 mg/dL — AB (ref 6–20)
CALCIUM: 10 mg/dL (ref 8.9–10.3)
CO2: 21 mmol/L — ABNORMAL LOW (ref 22–32)
Chloride: 114 mmol/L — ABNORMAL HIGH (ref 101–111)
Creatinine, Ser: 1.3 mg/dL — ABNORMAL HIGH (ref 0.61–1.24)
GFR calc Af Amer: >60 mL/min (ref >60)
GFR, EST NON AFRICAN AMERICAN: 54 mL/min — AB (ref >60)
Glucose, Bld: 166 mg/dL — ABNORMAL HIGH (ref 65–99)
POTASSIUM: 4.4 mmol/L (ref 3.5–5.1)
SODIUM: 142 mmol/L (ref 135–145)

## 2018-01-09 LAB — MAGNESIUM: Magnesium: 1.9 mg/dL (ref 1.7–2.4)

## 2018-01-09 LAB — CULTURE, BLOOD (ROUTINE X 2): Special Requests: ADEQUATE

## 2018-01-09 LAB — PHOSPHORUS: PHOSPHORUS: 2.9 mg/dL (ref 2.5–4.6)

## 2018-01-09 LAB — TRIGLYCERIDES: TRIGLYCERIDES: 305 mg/dL — AB (ref <150)

## 2018-01-09 LAB — TROPONIN I: Troponin I: 0.13 ng/mL (ref <0.03)

## 2018-01-09 MED ORDER — INSULIN ASPART 100 UNIT/ML ~~LOC~~ SOLN
0.0000 [IU] | SUBCUTANEOUS | Status: DC
  Administered 2018-01-10: 4 [IU] via SUBCUTANEOUS
  Administered 2018-01-10: 15 [IU] via SUBCUTANEOUS

## 2018-01-09 MED ORDER — INSULIN GLARGINE 100 UNIT/ML ~~LOC~~ SOLN
15.0000 [IU] | Freq: Every day | SUBCUTANEOUS | Status: DC
  Administered 2018-01-09: 15 [IU] via SUBCUTANEOUS
  Filled 2018-01-09: qty 0.15

## 2018-01-09 MED ORDER — INSULIN ASPART 100 UNIT/ML ~~LOC~~ SOLN
8.0000 [IU] | Freq: Once | SUBCUTANEOUS | Status: AC
  Administered 2018-01-09: 8 [IU] via SUBCUTANEOUS

## 2018-01-09 MED ORDER — FENTANYL 2500MCG IN NS 250ML (10MCG/ML) PREMIX INFUSION
50.0000 ug/h | INTRAVENOUS | Status: DC
  Administered 2018-01-09: 50 ug/h via INTRAVENOUS
  Administered 2018-01-10 – 2018-01-11 (×2): 75 ug/h via INTRAVENOUS
  Administered 2018-01-12: 150 ug/h via INTRAVENOUS
  Administered 2018-01-13: 100 ug/h via INTRAVENOUS
  Administered 2018-01-13 – 2018-01-14 (×2): 50 ug/h via INTRAVENOUS
  Administered 2018-01-15: 100 ug/h via INTRAVENOUS
  Administered 2018-01-16: 125 ug/h via INTRAVENOUS
  Administered 2018-01-17: 50 ug/h via INTRAVENOUS
  Filled 2018-01-09 (×9): qty 250

## 2018-01-09 MED ORDER — CHLORHEXIDINE GLUCONATE CLOTH 2 % EX PADS
6.0000 | MEDICATED_PAD | Freq: Every day | CUTANEOUS | Status: DC
  Administered 2018-01-09: 6 via TOPICAL

## 2018-01-09 NOTE — Progress Notes (Signed)
Dr. Debbora Dus made aware patient HR 1teens to upper 120s maxed on cardizem gtt at 15mg /hr. Clarified order for 60 mg cardizem scheduled per tube-give per MD. Also made aware patient remains on insulin gtt and propofol gtt. Last blood sugars in target range. MD ordered one time dose SQ insulin to help facilitate transition off gtt. MD aware patient still awaiting MRI. Nursing to continue to monitor.

## 2018-01-09 NOTE — Progress Notes (Signed)
11 beat run of VTach. ELink notified. Labs sent recently at 0400

## 2018-01-09 NOTE — Progress Notes (Signed)
[facility] Progress Note Patient Name: [name] DOB: 11/30/1945 MRN: [id number]   Date of Service  01/09/2018  HPI/Events of Note  12beat V tach run per RN. On tele - looks like slwo  A Fib v sinus. Good BP  eICU Interventions  Check ekg, trop, bmet, mag , phos     Intervention Category Major Interventions: Arrhythmia - evaluation and management  Brand Males 01/09/2018, 4:32 AM

## 2018-01-09 NOTE — Progress Notes (Addendum)
Subjective: Sedated and intubated.   Objective: Current vital signs: BP (!) 160/82   Pulse (!) 129   Temp 99.3 F (37.4 C) (Oral)   Resp (!) 32   Ht '5\' 9"'$  (1.753 m)   Wt 120.6 kg (265 lb 14 oz)   SpO2 100%   BMI 39.26 kg/m  Vital signs in last 24 hours: Temp:  [98.7 F (37.1 C)-102 F (38.9 C)] 99.3 F (37.4 C) (04/12 0800) Pulse Rate:  [88-129] 129 (04/12 1400) Resp:  [23-41] 32 (04/12 1400) BP: (111-175)/(57-93) 160/82 (04/12 1400) SpO2:  [99 %-100 %] 100 % (04/12 1400) FiO2 (%):  [40 %] 40 % (04/12 1205) Weight:  [120.6 kg (265 lb 14 oz)] 120.6 kg (265 lb 14 oz) (04/12 0351)  Intake/Output from previous day: 04/11 0701 - 04/12 0700 In: 5414.4 [I.V.:4489.4; NG/GT:725; IV Piggyback:200] Out: 2200 [Urine:1700; Stool:500] Intake/Output this shift: Total I/O In: 160 [NG/GT:160] Out: 500 [Urine:500] Nutritional status: Diet NPO time specified  Neurologic Exam: Ment: No response to any verbal or noxious stimuli. Sedated on propofol.  CN: Pupils small and unreactive. No doll's eye reflex. Face flaccidly symmetric. (sedated on propofol).  Motor: Flaccid tone x 4 without movement to light noxious stimuli (sedated).   Lab Results: Results for orders placed or performed during the hospital encounter of 11/29/2017 (from the past 48 hour(s))  Glucose, capillary     Status: Abnormal   Collection Time: 01/07/18  4:38 PM  Result Value Ref Range   Glucose-Capillary 372 (H) 65 - 99 mg/dL  Glucose, capillary     Status: Abnormal   Collection Time: 01/07/18  8:07 PM  Result Value Ref Range   Glucose-Capillary 248 (H) 65 - 99 mg/dL  Glucose, capillary     Status: Abnormal   Collection Time: 01/07/18  9:09 PM  Result Value Ref Range   Glucose-Capillary 205 (H) 65 - 99 mg/dL  Glucose, capillary     Status: Abnormal   Collection Time: 01/07/18  9:56 PM  Result Value Ref Range   Glucose-Capillary 202 (H) 65 - 99 mg/dL  Glucose, capillary     Status: Abnormal   Collection Time:  01/07/18 10:59 PM  Result Value Ref Range   Glucose-Capillary 204 (H) 65 - 99 mg/dL  Glucose, capillary     Status: Abnormal   Collection Time: 01/07/18 11:59 PM  Result Value Ref Range   Glucose-Capillary 189 (H) 65 - 99 mg/dL  Glucose, capillary     Status: Abnormal   Collection Time: 01/08/18  1:06 AM  Result Value Ref Range   Glucose-Capillary 199 (H) 65 - 99 mg/dL  Glucose, capillary     Status: Abnormal   Collection Time: 01/08/18  2:00 AM  Result Value Ref Range   Glucose-Capillary 198 (H) 65 - 99 mg/dL  Glucose, capillary     Status: Abnormal   Collection Time: 01/08/18  3:06 AM  Result Value Ref Range   Glucose-Capillary 152 (H) 65 - 99 mg/dL  Glucose, capillary     Status: Abnormal   Collection Time: 01/08/18  4:02 AM  Result Value Ref Range   Glucose-Capillary 158 (H) 65 - 99 mg/dL  Procalcitonin     Status: None   Collection Time: 01/08/18  4:21 AM  Result Value Ref Range   Procalcitonin <0.10 ng/mL    Comment:        Interpretation: PCT (Procalcitonin) <= 0.5 ng/mL: Systemic infection (sepsis) is not likely. Local bacterial infection is possible. (NOTE)  Sepsis PCT Algorithm           Lower Respiratory Tract                                      Infection PCT Algorithm    ----------------------------     ----------------------------         PCT < 0.25 ng/mL                PCT < 0.10 ng/mL         Strongly encourage             Strongly discourage   discontinuation of antibiotics    initiation of antibiotics    ----------------------------     -----------------------------       PCT 0.25 - 0.50 ng/mL            PCT 0.10 - 0.25 ng/mL               OR       >80% decrease in PCT            Discourage initiation of                                            antibiotics      Encourage discontinuation           of antibiotics    ----------------------------     -----------------------------         PCT >= 0.50 ng/mL              PCT 0.26 - 0.50 ng/mL                AND        <80% decrease in PCT             Encourage initiation of                                             antibiotics       Encourage continuation           of antibiotics    ----------------------------     -----------------------------        PCT >= 0.50 ng/mL                  PCT > 0.50 ng/mL               AND         increase in PCT                  Strongly encourage                                      initiation of antibiotics    Strongly encourage escalation           of antibiotics                                     -----------------------------  PCT <= 0.25 ng/mL                                                 OR                                        > 80% decrease in PCT                                     Discontinue / Do not initiate                                             antibiotics Performed at [facility], [place] [address], [place], [place] [address]   Basic metabolic panel     Status: Abnormal   Collection Time: 01/08/18  4:21 AM  Result Value Ref Range   Sodium 142 135 - 145 mmol/L   Potassium 3.8 3.5 - 5.1 mmol/L   Chloride 112 (H) 101 - 111 mmol/L   CO2 23 22 - 32 mmol/L   Glucose, Bld 126 (H) 65 - 99 mg/dL   BUN 34 (H) 6 - 20 mg/dL   Creatinine, Ser 1.44 (H) 0.61 - 1.24 mg/dL   Calcium 9.2 8.9 - 10.3 mg/dL   GFR calc non Af Amer 48 (L) >60 mL/min   GFR calc Af Amer 56 (L) >60 mL/min    Comment: (NOTE) The eGFR has been calculated using the CKD EPI equation. This calculation has not been validated in all clinical situations. eGFR's persistently <60 mL/min signify possible Chronic Kidney Disease.    Anion gap 7 5 - 15    Comment: Performed at [facility] [address], [place], [place] [address]  Glucose, capillary     Status: Abnormal   Collection Time: 01/08/18  5:01 AM  Result Value Ref Range   Glucose-Capillary 126 (H) 65 - 99 mg/dL  Glucose, capillary     Status: Abnormal    Collection Time: 01/08/18  5:59 AM  Result Value Ref Range   Glucose-Capillary 120 (H) 65 - 99 mg/dL  Glucose, capillary     Status: Abnormal   Collection Time: 01/08/18  6:59 AM  Result Value Ref Range   Glucose-Capillary 163 (H) 65 - 99 mg/dL  Glucose, capillary     Status: Abnormal   Collection Time: 01/08/18  8:28 AM  Result Value Ref Range   Glucose-Capillary 175 (H) 65 - 99 mg/dL  Glucose, capillary     Status: Abnormal   Collection Time: 01/08/18  9:06 AM  Result Value Ref Range   Glucose-Capillary 187 (H) 65 - 99 mg/dL  Glucose, capillary     Status: Abnormal   Collection Time: 01/08/18 10:13 AM  Result Value Ref Range   Glucose-Capillary 184 (H) 65 - 99 mg/dL  Glucose, capillary     Status: Abnormal   Collection Time: 01/08/18 11:08 AM  Result Value Ref Range   Glucose-Capillary 174 (H) 65 - 99 mg/dL  Ammonia     Status: None   Collection Time: 01/08/18  11:10 AM  Result Value Ref Range   Ammonia 22 9 - 35 umol/L    Comment: Performed at [facility], [place] [address] [place],  [address]  Hepatic function panel     Status: Abnormal   Collection Time: 01/08/18 11:10 AM  Result Value Ref Range   Total Protein 5.5 (L) 6.5 - 8.1 g/dL   Albumin 2.0 (L) 3.5 - 5.0 g/dL   AST 19 15 - 41 U/L   ALT 72 (H) 17 - 63 U/L   Alkaline Phosphatase 75 38 - 126 U/L   Total Bilirubin 0.9 0.3 - 1.2 mg/dL   Bilirubin, Direct 0.4 0.1 - 0.5 mg/dL   Indirect Bilirubin 0.5 0.3 - 0.9 mg/dL    Comment: Performed at [facility] [address], [place], [place] [address]  Glucose, capillary     Status: Abnormal   Collection Time: 01/08/18 12:14 PM  Result Value Ref Range   Glucose-Capillary 158 (H) 65 - 99 mg/dL  Glucose, capillary     Status: Abnormal   Collection Time: 01/08/18  1:17 PM  Result Value Ref Range   Glucose-Capillary 156 (H) 65 - 99 mg/dL  Glucose, capillary     Status: Abnormal   Collection Time: 01/08/18  2:16 PM  Result Value Ref Range    Glucose-Capillary 134 (H) 65 - 99 mg/dL  Glucose, capillary     Status: Abnormal   Collection Time: 01/08/18  3:26 PM  Result Value Ref Range   Glucose-Capillary 154 (H) 65 - 99 mg/dL  Glucose, capillary     Status: Abnormal   Collection Time: 01/08/18  4:00 PM  Result Value Ref Range   Glucose-Capillary 161 (H) 65 - 99 mg/dL  Glucose, capillary     Status: Abnormal   Collection Time: 01/08/18  4:55 PM  Result Value Ref Range   Glucose-Capillary 196 (H) 65 - 99 mg/dL  Glucose, capillary     Status: Abnormal   Collection Time: 01/08/18  6:03 PM  Result Value Ref Range   Glucose-Capillary 207 (H) 65 - 99 mg/dL  Glucose, capillary     Status: Abnormal   Collection Time: 01/08/18  7:08 PM  Result Value Ref Range   Glucose-Capillary 205 (H) 65 - 99 mg/dL  Glucose, capillary     Status: Abnormal   Collection Time: 01/08/18  8:14 PM  Result Value Ref Range   Glucose-Capillary 193 (H) 65 - 99 mg/dL  Glucose, capillary     Status: Abnormal   Collection Time: 01/08/18  9:24 PM  Result Value Ref Range   Glucose-Capillary 171 (H) 65 - 99 mg/dL  Glucose, capillary     Status: Abnormal   Collection Time: 01/08/18 10:38 PM  Result Value Ref Range   Glucose-Capillary 170 (H) 65 - 99 mg/dL  Glucose, capillary     Status: Abnormal   Collection Time: 01/08/18 11:45 PM  Result Value Ref Range   Glucose-Capillary 178 (H) 65 - 99 mg/dL  Glucose, capillary     Status: Abnormal   Collection Time: 01/09/18 12:51 AM  Result Value Ref Range   Glucose-Capillary 178 (H) 65 - 99 mg/dL  Glucose, capillary     Status: Abnormal   Collection Time: 01/09/18  1:59 AM  Result Value Ref Range   Glucose-Capillary 171 (H) 65 - 99 mg/dL  Glucose, capillary     Status: Abnormal   Collection Time: 01/09/18  3:06 AM  Result Value Ref Range   Glucose-Capillary 173 (  H) 65 - 99 mg/dL  Blood gas, arterial     Status: Abnormal   Collection Time: 01/09/18  3:53 AM  Result Value Ref Range   FIO2 40.00    Delivery  systems VENTILATOR    Mode PRESSURE REGULATED VOLUME CONTROL    VT 570 mL   LHR 14.0 resp/min   Peep/cpap 5.0 cm H20   pH, Arterial 7.363 7.350 - 7.450   pCO2 arterial 39.6 32.0 - 48.0 mmHg   pO2, Arterial 137 (H) 83.0 - 108.0 mmHg   Bicarbonate 21.9 20.0 - 28.0 mmol/L   Acid-base deficit 2.7 (H) 0.0 - 2.0 mmol/L   O2 Saturation 99.0 %   Patient temperature 99.0    Collection site LEFT RADIAL    Drawn by [phone]    Sample type ARTERIAL DRAW    Allens test (pass/fail) PASS PASS  Glucose, capillary     Status: Abnormal   Collection Time: 01/09/18  4:03 AM  Result Value Ref Range   Glucose-Capillary 139 (H) 65 - 99 mg/dL  Basic metabolic panel     Status: Abnormal   Collection Time: 01/09/18  4:13 AM  Result Value Ref Range   Sodium 142 135 - 145 mmol/L   Potassium 4.4 3.5 - 5.1 mmol/L   Chloride 114 (H) 101 - 111 mmol/L   CO2 21 (L) 22 - 32 mmol/L   Glucose, Bld 166 (H) 65 - 99 mg/dL   BUN 45 (H) 6 - 20 mg/dL   Creatinine, Ser 1.30 (H) 0.61 - 1.24 mg/dL   Calcium 10.0 8.9 - 10.3 mg/dL   GFR calc non Af Amer 54 (L) >60 mL/min   GFR calc Af Amer >60 >60 mL/min    Comment: (NOTE) The eGFR has been calculated using the CKD EPI equation. This calculation has not been validated in all clinical situations. eGFR's persistently <60 mL/min signify possible Chronic Kidney Disease.    Anion gap 7 5 - 15    Comment: Performed at [facility] [address] [place], [place] [address]  CBC with Differential/Platelet     Status: Abnormal   Collection Time: 01/09/18  4:13 AM  Result Value Ref Range   WBC 21.1 (H) 4.0 - 10.5 K/uL   RBC 3.28 (L) 4.22 - 5.81 MIL/uL   Hemoglobin 8.8 (L) 13.0 - 17.0 g/dL   HCT 28.6 (L) 39.0 - 52.0 %   MCV 87.2 78.0 - 100.0 fL   MCH 26.8 26.0 - 34.0 pg   MCHC 30.8 30.0 - 36.0 g/dL   RDW 16.6 (H) 11.5 - 15.5 %   Platelets 266 150 - 400 K/uL   Neutrophils Relative % 98 %   Lymphocytes Relative 2 %   Monocytes Relative 0 %   Eosinophils Relative 0  %   Basophils Relative 0 %   Band Neutrophils 0 %   Metamyelocytes Relative 0 %   Myelocytes 0 %   Promyelocytes Relative 0 %   Blasts 0 %   nRBC 0 0 /100 WBC   Other 0 %   Neutro Abs 20.7 (H) 1.7 - 7.7 K/uL   Lymphs Abs 0.4 (L) 0.7 - 4.0 K/uL   Monocytes Absolute 0.0 (L) 0.1 - 1.0 K/uL   Eosinophils Absolute 0.0 0.0 - 0.7 K/uL   Basophils Absolute 0.0 0.0 - 0.1 K/uL   Smear Review MORPHOLOGY UNREMARKABLE     Comment: Performed at [facility], [place] [address], [place], [place] [address]  Magnesium  Status: None   Collection Time: 01/09/18  4:13 AM  Result Value Ref Range   Magnesium 1.9 1.7 - 2.4 mg/dL    Comment: Performed at [facility] [address], [place], [place] [address]  Phosphorus     Status: None   Collection Time: 01/09/18  4:13 AM  Result Value Ref Range   Phosphorus 2.9 2.5 - 4.6 mg/dL    Comment: Performed at [facility] [address], [place], [place] [address]  Troponin I     Status: Abnormal   Collection Time: 01/09/18  4:13 AM  Result Value Ref Range   Troponin I 0.13 (HH) <0.03 ng/mL    Comment: CRITICAL RESULT CALLED TO, READ BACK BY AND VERIFIED WITH: TRAVIS B,RN 01/09/18 0508 WAYK Performed at [facility] [address], [place], [place] [address]   Glucose, capillary     Status: Abnormal   Collection Time: 01/09/18  5:06 AM  Result Value Ref Range   Glucose-Capillary 155 (H) 65 - 99 mg/dL  Glucose, capillary     Status: Abnormal   Collection Time: 01/09/18  6:11 AM  Result Value Ref Range   Glucose-Capillary 175 (H) 65 - 99 mg/dL  Glucose, capillary     Status: Abnormal   Collection Time: 01/09/18  7:17 AM  Result Value Ref Range   Glucose-Capillary 180 (H) 65 - 99 mg/dL  Glucose, capillary     Status: Abnormal   Collection Time: 01/09/18  7:33 AM  Result Value Ref Range   Glucose-Capillary 199 (H) 65 - 99 mg/dL  Glucose, capillary     Status: Abnormal   Collection Time: 01/09/18  8:04 AM  Result Value Ref  Range   Glucose-Capillary 174 (H) 65 - 99 mg/dL  Glucose, capillary     Status: Abnormal   Collection Time: 01/09/18  9:08 AM  Result Value Ref Range   Glucose-Capillary 188 (H) 65 - 99 mg/dL  Glucose, capillary     Status: Abnormal   Collection Time: 01/09/18 10:03 AM  Result Value Ref Range   Glucose-Capillary 195 (H) 65 - 99 mg/dL  Glucose, capillary     Status: Abnormal   Collection Time: 01/09/18 11:06 AM  Result Value Ref Range   Glucose-Capillary 189 (H) 65 - 99 mg/dL  Glucose, capillary     Status: Abnormal   Collection Time: 01/09/18 12:11 PM  Result Value Ref Range   Glucose-Capillary 186 (H) 65 - 99 mg/dL  Glucose, capillary     Status: Abnormal   Collection Time: 01/09/18  1:11 PM  Result Value Ref Range   Glucose-Capillary 162 (H) 65 - 99 mg/dL  Glucose, capillary     Status: Abnormal   Collection Time: 01/09/18  2:02 PM  Result Value Ref Range   Glucose-Capillary 164 (H) 65 - 99 mg/dL    Recent Results (from the past 240 hour(s))  Culture, blood (Routine X 2) w Reflex to ID Panel     Status: None   Collection Time: 01/03/18  1:17 PM  Result Value Ref Range Status   Specimen Description BLOOD RIGHT ANTECUBITAL  Final   Special Requests   Final    BOTTLES DRAWN AEROBIC ONLY Blood Culture adequate volume   Culture   Final    NO GROWTH 5 DAYS Performed at [facility], [place] [address] [place], [place] [address]    Report Status 01/08/2018 FINAL  Final  Culture, blood (Routine X 2) w Reflex to  ID Panel     Status: None   Collection Time: 01/03/18  1:20 PM  Result Value Ref Range Status   Specimen Description BLOOD RIGHT ANTECUBITAL  Final   Special Requests   Final    BOTTLES DRAWN AEROBIC ONLY Blood Culture adequate volume   Culture   Final    NO GROWTH 5 DAYS Performed at [facility], [place] [address] [place], [place] [address]    Report Status 01/08/2018 FINAL  Final  Culture, blood (routine x 2)     Status: None (Preliminary result)    Collection Time: 01/06/18  9:32 AM  Result Value Ref Range Status   Specimen Description BLOOD LEFT ANTECUBITAL  Final   Special Requests   Final    BOTTLES DRAWN AEROBIC AND ANAEROBIC Blood Culture adequate volume   Culture   Final    NO GROWTH 3 DAYS Performed at [facility], [place] [address], [place], [place] [address]    Report Status PENDING  Incomplete  Culture, blood (routine x 2)     Status: Abnormal   Collection Time: 01/06/18  9:43 AM  Result Value Ref Range Status   Specimen Description BLOOD HAND LEFT  Final   Special Requests   Final    BOTTLES DRAWN AEROBIC AND ANAEROBIC Blood Culture adequate volume   Culture  Setup Time   Final    GRAM POSITIVE COCCI IN BOTH AEROBIC AND ANAEROBIC BOTTLES Organism ID to follow CRITICAL RESULT CALLED TO, READ BACK BY AND VERIFIED WITH: Francene Boyers PharmD 10:45 01/07/18 ([email or web address])    Culture (A)  Final    STAPHYLOCOCCUS SPECIES (COAGULASE NEGATIVE) THE SIGNIFICANCE OF ISOLATING THIS ORGANISM FROM A SINGLE SET OF BLOOD CULTURES WHEN MULTIPLE SETS ARE DRAWN IS UNCERTAIN. PLEASE NOTIFY THE MICROBIOLOGY DEPARTMENT WITHIN ONE WEEK IF SPECIATION AND SENSITIVITIES ARE REQUIRED. Performed at [facility], [place] [address], [place], [place] [address]    Report Status 01/09/2018 FINAL  Final  Blood Culture ID Panel (Reflexed)     Status: Abnormal   Collection Time: 01/06/18  9:43 AM  Result Value Ref Range Status   Enterococcus species NOT DETECTED NOT DETECTED Final   Listeria monocytogenes NOT DETECTED NOT DETECTED Final   Staphylococcus species DETECTED (A) NOT DETECTED Final    Comment: Methicillin (oxacillin) susceptible coagulase negative staphylococcus. Possible blood culture contaminant (unless isolated from more than one blood culture draw or clinical case suggests pathogenicity). No antibiotic treatment is indicated for blood  culture contaminants. CRITICAL RESULT CALLED TO, READ BACK BY AND VERIFIED WITH: Francene Boyers PharmD 10:45  01/07/18 ([email or web address])    Staphylococcus aureus NOT DETECTED NOT DETECTED Final   Methicillin resistance NOT DETECTED NOT DETECTED Final   Streptococcus species NOT DETECTED NOT DETECTED Final   Streptococcus agalactiae NOT DETECTED NOT DETECTED Final   Streptococcus pneumoniae NOT DETECTED NOT DETECTED Final   Streptococcus pyogenes NOT DETECTED NOT DETECTED Final   Acinetobacter baumannii NOT DETECTED NOT DETECTED Final   Enterobacteriaceae species NOT DETECTED NOT DETECTED Final   Enterobacter cloacae complex NOT DETECTED NOT DETECTED Final   Escherichia coli NOT DETECTED NOT DETECTED Final   Klebsiella oxytoca NOT DETECTED NOT DETECTED Final   Klebsiella pneumoniae NOT DETECTED NOT DETECTED Final   Proteus species NOT DETECTED NOT DETECTED Final   Serratia marcescens NOT DETECTED NOT DETECTED Final   Haemophilus influenzae NOT DETECTED NOT DETECTED Final   Neisseria meningitidis NOT DETECTED NOT DETECTED Final   Pseudomonas aeruginosa NOT DETECTED  NOT DETECTED Final   Candida albicans NOT DETECTED NOT DETECTED Final   Candida glabrata NOT DETECTED NOT DETECTED Final   Candida krusei NOT DETECTED NOT DETECTED Final   Candida parapsilosis NOT DETECTED NOT DETECTED Final   Candida tropicalis NOT DETECTED NOT DETECTED Final    Comment: Performed at [facility], [place] [address] [place], [place] [address]  Culture, respiratory (NON-Expectorated)     Status: None   Collection Time: 01/06/18  3:10 PM  Result Value Ref Range Status   Specimen Description TRACHEAL ASPIRATE  Final   Special Requests NONE  Final   Gram Stain   Final    ABUNDANT WBC PRESENT,BOTH PMN AND MONONUCLEAR MODERATE GRAM NEGATIVE RODS FEW GRAM POSITIVE COCCI Performed at [facility], [place] [address] [place],  [address]    Culture MODERATE KLEBSIELLA PNEUMONIAE  Final   Report Status 01/08/2018 FINAL  Final   Organism ID, Bacteria KLEBSIELLA PNEUMONIAE  Final      Susceptibility   Klebsiella  pneumoniae - MIC*    AMPICILLIN RESISTANT Resistant     CEFAZOLIN <=4 SENSITIVE Sensitive     CEFEPIME <=1 SENSITIVE Sensitive     CEFTAZIDIME <=1 SENSITIVE Sensitive     CEFTRIAXONE <=1 SENSITIVE Sensitive     CIPROFLOXACIN <=0.25 SENSITIVE Sensitive     GENTAMICIN <=1 SENSITIVE Sensitive     IMIPENEM <=0.25 SENSITIVE Sensitive     TRIMETH/SULFA <=20 SENSITIVE Sensitive     AMPICILLIN/SULBACTAM 4 SENSITIVE Sensitive     PIP/TAZO <=4 SENSITIVE Sensitive     Extended ESBL NEGATIVE Sensitive     * MODERATE KLEBSIELLA PNEUMONIAE    Lipid Panel Recent Labs    01/06/18 1537  TRIG 213*    Studies/Results: Dg Chest Port 1 View  Result Date: 01/08/2018 CLINICAL DATA:  Dyspnea EXAM: PORTABLE CHEST 1 VIEW COMPARISON:  01/06/2018 FINDINGS: Cardiac shadow is stable. Endotracheal tube and nasogastric catheter are again seen as well as a right-sided PICC line. No focal infiltrate or sizable effusion is seen. No bony abnormality is noted. IMPRESSION: No acute abnormality noted. Electronically Signed   By: Inez Catalina M.D.   On: 01/08/2018 12:59    Medications:  Scheduled: . chlorhexidine gluconate (MEDLINE KIT)  15 mL Mouth Rinse BID  . Chlorhexidine Gluconate Cloth  6 each Topical Daily  . Chlorhexidine Gluconate Cloth  6 each Topical Q0600  . diltiazem  60 mg Oral Q8H  . feeding supplement (PRO-STAT SUGAR FREE 64)  60 mL Per Tube 5 X Daily  . ipratropium-albuterol  3 mL Nebulization TID  . mouth rinse  15 mL Mouth Rinse 10 times per day  . methimazole  20 mg Per Tube Q6H  . methylPREDNISolone (SOLU-MEDROL) injection  40 mg Intravenous Q6H  . multivitamin  15 mL Per Tube Daily  . pantoprazole (PROTONIX) IV  40 mg Intravenous Q24H  . potassium chloride  40 mEq Per Tube BID  . sodium chloride flush  10-40 mL Intracatheter Q12H  . thiamine injection  100 mg Intravenous Daily   Continuous: . sodium chloride 10 mL/hr at 01/09/18 0600  . sodium chloride 75 mL/hr at 01/09/18 0600  .  cefTRIAXone (ROCEPHIN)  IV Stopped (01/09/18 1335)  . dextrose 5 % and 0.45% NaCl 50 mL/hr at 01/09/18 1337  . diltiazem (CARDIZEM) infusion 15 mg/hr (01/09/18 1111)  . feeding supplement (VITAL HIGH PROTEIN) 15 mL/hr at 01/09/18 0600  . insulin (NOVOLIN-R) infusion 12 mL/hr at 01/09/18 1400  . lactated  ringers Stopped (01/07/18 1712)  . levETIRAcetam Stopped (01/09/18 0522)  . propofol (DIPRIVAN) infusion 20 mcg/kg/min (01/09/18 1426)    Assessment [name] is a 72 yr old male with history of a fib, stage 3 chronic kidney disease, DM2, adenocarcinoma of colon s/p resection and current colostomy, history of renal cell carcinoma s/o resection who was admitted on 12/02/2017 for shortnes s of breath, supratherputic INR, afib with RVR and pulm edema. During his stay he was diagnosed with thyrotoxicosis, bilateral SDH and seizure. On 4/2 PCCM consulted for increased work of breathing, hypoxia, AMS. He was intubated and tx to ICU.  He was transferred out of the ICU on 01/02/2018 and sent back to the ICU on 01/03/2018 when he became acutely dyspneic with tachypnea.  Patient was started on BiPAP and became comfortable, more awake and became more interactive. Had to be reintubated again on 01/06/2018.   1.  Multifactorial encephalopathy-initially thought to be metabolic or alcoholic in pt with bilateral subdural hematoma.  Patient has become more obtunded with some aggression. The persistence of his AMS was concerning despite aggressive management of metabolic conditions. Patient has not had any visible seizures. Initial EEG did not show evidence of ongoing seizure. Follow up EEG also reveals no seizure but is clearly abnormal and c/w a diffuse encephalopathy.  2. Additional history obtained from family: He has had gradually increasing mood lability as well as paranoia and persecutory delusions since about one year ago. In mid March he precipitously declined, with intermittent dizziness, an episode where he fell  striking his head and intermittent episodes of dysarthric speech as well as acutely worsened swallowing function. Of note, his dysarthric speech improved with steroid treatment during this admission, then worsened after steroids were held. MRI brain personally reviewed and although the study was of poor quality and it was not mentioned in the Radiology report, there may be symmetric restricted diffusion abnormality within the anterior cerebellar hemispheres and brainstem. In the context of a history of cancer, a paraneoplastic rhombencephalitis or cerebellitis should be considered.  3. DDx also includes hypoxic/ischemic injury, toxic/metabolic encephalopathies and neurodegenerative disorders. Propofol could also result in EEG changes an EEG should be repeated if possible off sedation.  4.  A. fib with RVR currently on Cardizem drip 5. Bilateral Subdural Hematoma, worse on the right 6. Hyperthyroidism 7. Seizure  Plan: -Repeat MRI brain with and without contrast (ordered) to determine if there has been interval change relative to the MRI performed 10 days ago. -If repeat imaging is unrevealing, will need to have LP -Anti Jo-1 antibody has been ordered - Continue aggressive management of metabolic problems - Patient is currently intubated and sedated on propofol-Neurology to perform more accurate neuro exam when patient is off sedation and possibly extubated --Repeat CT did not show any change in subdural hematoma no acute abnormalities. - Patient with a history of alcohol abuse, check thiamine and possibly start banana bag pending results. - Continue Keppra for seizures. - Discussed with family and Dr. Debbora Dus  40 minutes spent in the neurological assessment and management of this critically ill patient. Time spent included discussion with family and coordination of care with Dr. Debbora Dus.   LOS: 14 days   '@Electronically'$  signed: Dr. Kerney Elbe 01/09/2018  2:27 PM

## 2018-01-09 NOTE — Progress Notes (Signed)
Patient has had four consecutive blood sugars below target range on the insulin gtt. MD paged for transition orders. Eugina Row, Rande Brunt, RN

## 2018-01-09 NOTE — Progress Notes (Signed)
SLP Cancellation Note  Patient Details Name: [name] MRN: [id number] DOB: 05/12/1946   Cancelled treatment:       Reason Eval/Treat Not Completed: Patient not medically ready. Will sign off   Naimah Yingst, Katherene Ponto 01/09/2018, 7:42 AM

## 2018-01-09 NOTE — Progress Notes (Signed)
PULMONARY / CRITICAL CARE MEDICINE   Name: [name] MRN: [id number] DOB: 10/25/1945    ADMISSION DATE:  12/03/2017 CONSULTATION DATE: 12/30/2017  REFERRING MD:  Karie Kirks  CHIEF COMPLAINT:  Shortness of breath   HISTORY OF PRESENT ILLNESS:   Mr [name] is a 72 yr old male with history of a fib, stage 3 chronic kidney disease, DM2, adenocarcinoma of colon s/p resection and current colostomy, history of renal cell carcinoma s/o resection who was admitted on 11/29/2017 for shortnes s of breath, supratherputic INR,  afib with RVR and pulm edema. During his stay he was diagnosed with thyrotoxicosis and bilateral SDH and seizure. On 4/2 PCCM consulted for increased work of breathing, hypoxia, AMS.  He was intubated and tx to ICU.      Recent Events Extubated.  Transferred out of ICU on 4/5 Called back on 4/6 as he became acutely dyspneic with tachypnea Chest x-ray is better than before and ABG shows mild alkalosis  4/7 The patient is awake and appears relatively comfortable. He remains on BIPAP. His CXR was fairly clear. ABG shows a pH of 7.44/ PacO2 45/Pa02  132  4/8 Patient opens eyes but noit trying to speak this AM. He is on  ansal cannula and maintaining his 02 sats.  4/9 The patient remains obtunded. He is a bit tachypneic as well.  He has a low grade temp of about 100. His wife is fairly concerned aboiut the patient. His last CT scan of 4/6 actually appeared improved and his TFTs appeared recently to be trending in the right direction.  4/10 The patient became progressively obtunded yesterday. We intubated him in the early afternoon. I asked neurolgy to weigh in. It appears to be atoxic-metabolic issue. The patient amy have a respiratory infx. There were thoick green-yellow secretions on suctionoing and visualized in the beck of the pharynx on intubation. The patient had been running a low grade temp as well.  4/11 The patient is sedated on the ventilator. Currently getting  an EEG. He is hemodynamically stable. His heart rate is still 110-120.  I am going to restart po Cardizem. We had to place thepatient on an insulin drip as his BS were consistently in the high 200-300s.  4/12 The patient remains on the ventialtor. The family has told us that prior to admission thepatient was having personality changes (paranoia)  and dysarthria. Dr. Cheral Marker reviewed the first MRI and he bleieve there may be inflammatory changes in the cerebellum. We are going to get a new MRI with contrast today. If for some reason it is not diagnopstic we may need to perform an LP. There were not telltale signs of herpeetic encephalitis on the first MRI howver.    VITAL SIGNS: BP 128/73 (BP Location: Left Arm)   Pulse 96   Temp 99.3 F (37.4 C) (Oral)   Resp (!) 25   Ht 5\' 9"  (1.753 m)   Wt 265 lb 14 oz (120.6 kg)   SpO2 100%   BMI 39.26 kg/m        INTAKE / OUTPUT: I/O last 3 completed shifts: In: 7638.9 [I.V.:6023.9; Other:360; NG/GT:905; IV Piggyback:350] Out: 3010 [Urine:2260; Stool:750]  PHYSICAL EXAMINATION: Gen:      No acute distress. [name] on the ventialtor HEENT:  EOMI, sclera anicteric. NGT in left nare Neck:     No masses; no thyromegaly Lungs:    Clear to auscultation bilaterally; normal respiratory effort CV:         Regular  rate and rhythm; no murmurs Abd:      Obese,+ bowel sounds; soft, non-tender; no palpable masses, no distension Ext:    No edema; adequate peripheral perfusion Skin:      Warm and dry; no rash Neuro:  Will open eyes when coaxed. Moved RUE but not the left. woiill reassess when he is more awake.  LABS:  BMET Recent Labs  Lab 01/05/18 1652 01/08/18 0421 01/09/18 0413  NA 141 142 142  K 3.6 3.8 4.4  CL 107 112* 114*  CO2 27 23 21*  BUN 25* 34* 45*  CREATININE 1.27* 1.44* 1.30*  GLUCOSE 269* 126* 166*    Electrolytes Recent Labs  Lab 01/04/18 0548  01/05/18 1652 01/08/18 0421 01/09/18 0413  CALCIUM 9.4   < >  9.5 9.2 10.0  MG 1.8  --   --   --  1.9  PHOS 3.0  --   --   --  2.9   < > = values in this interval not displayed.    CBC Recent Labs  Lab 01/03/18 1446 01/06/18 0838 01/09/18 0413  WBC 13.5* 13.9* 21.1*  HGB 10.5* 10.6* 8.8*  HCT 36.0* 34.7* 28.6*  PLT 380 323 266    Coag's No results for input(s): APTT, INR in the last 168 hours.  Sepsis Markers Recent Labs  Lab 01/03/18 1446 01/03/18 1851  01/06/18 0838 01/07/18 0434 01/08/18 0421  LATICACIDVEN 1.9 1.8  --   --   --   --   PROCALCITON <0.10  --    < > <0.10 <0.10 <0.10   < > = values in this interval not displayed.    ABG Recent Labs  Lab 01/06/18 0913 01/06/18 1656 01/09/18 0353  PHART 7.484* 7.469* 7.363  PCO2ART 32.7 37.6 39.6  PO2ART 95.0 456.0* 137*    Liver Enzymes Recent Labs  Lab 01/03/18 1446 01/04/18 0548 01/08/18 1110  AST 48* 47* 19  ALT 165* 162* 72*  ALKPHOS 86 79 75  BILITOT 0.9 1.4* 0.9  ALBUMIN 2.4* 2.2* 2.0*    Cardiac Enzymes Recent Labs  Lab 01/03/18 1851 01/04/18 0217 01/09/18 0413  TROPONINI 0.33* 0.21* 0.13*    Glucose Recent Labs  Lab 01/09/18 0306 01/09/18 0403 01/09/18 0506 01/09/18 0611 01/09/18 0717 01/09/18 0733  GLUCAP 173* 139* 155* 175* 180* 199*    Imaging Dg Chest Port 1 View  Result Date: 01/08/2018 CLINICAL DATA:  Dyspnea EXAM: PORTABLE CHEST 1 VIEW COMPARISON:  01/06/2018 FINDINGS: Cardiac shadow is stable. Endotracheal tube and nasogastric catheter are again seen as well as a right-sided PICC line. No focal infiltrate or sizable effusion is seen. No bony abnormality is noted. IMPRESSION: No acute abnormality noted. Electronically Signed   By: Inez Catalina M.D.   On: 01/08/2018 12:59   STUDIES:  CT head 12/28/2017 >>1. Bilateral subdural hematomas, right larger than left. Focal collection of blood in the right frontoparietal region with hematocrit level, ongoing active bleeding not excluded. Short interval follow-up recommended. 2. Minimal  mass effect without significant midline shift. MRI brian 4/1>>> 1. Stable right greater than left subdural hematomas since the head CT yesterday.  The right side subdural demonstrates greater lobulation and signal heterogeneity measuring between 8 and 17 mm in thickness. The smaller more homogeneous left side subdural measures 6-7 mm in thickness. 2. Stable mild intracranial mass effect with trace leftward midline shift. Normal basilar cisterns. Trace intraventricular hemorrhage with no ventriculomegaly. 3. No superimposed acute infarct. CT head 4/2>>> 1. Stable right larger  than left mixed attenuation subdural hematomas. Stable minimal associated mass effect. No herniation. 2. No new acute intracranial abnormality identified. US thyroid 4/4>>> 1.5 cm right mid thyroid TR 4 nodule meets criteria for biopsy as Above.  1.4 cm left inferior TR 3 nodule does not meet criteria for any biopsy or follow-up.   Nonspecific gland heterogeneity.  CULTURES: BC x 2 3/29>>> 1/2 coag neg staph>> suspect contaminant  BC x 2 4/1>>> neg   LINES/TUBES:  ETT 4/2>> 4/4 RUA PICC 3/30>>>  ANTIBIOTICS: Zosyn 3/30>> 4/5, restart 4/6 Vanc 3/31>>>4/3  SIGNIFICANT EVENTS: 12/30/2017 transfer to ICU for worsening resp failure and mental status with afib RVR  01/01/2018>> Extubated  4/5 >> Transfer back to ICU with similar presentation 4/9:: Reintubation for worsening obtundation  ASSESSMENT / PLAN: Altered Mental Status The patient is sedated on the ventilator once again  he will move his head and briefly open hius eyes when provoked.  Repeat CT head did not show a significant change in the size or extent of the subdural hematomas.  Patient getting EEG presently. Will reassesss patient after stopping the propofol. The patient's course has been puzzling. Dr Cheral Marker suspects that thepatient amy in fact have a paraneiooplatic synfdrome involving the cerebellum   Pulm/Infx I bleieve the patient has a  resp., infx based on my above statements despite pretty unremrakable CXR. I don't believe the patient had a PE. His 02 needs are fairly minimal. I would be concerned about performing CTA with his kidney dysfn. Resp culture pending The poatient has been placed on Zosyn and Vancocin. The patient grew out coag neg staph in the blood. It likely represents a contaminant. He did grow out Klebsiella On in his sputum, sensitivities pending/. We hav stopped Vanco and deecalated treatment for the Klebsiella.      CARDIOVASCULAR  BP is stable. the patient is in a NSR.  HTN emergency. His troponin was 0.21 on 4/7 and had peaked on 4/6 aat 0.37 and I am not overly concerned. He is in Atrial fib with heart rate 110-120  2D echo 3//30>> EF -65%, mod hypertrophy, trivial MR, trivial TR, severely dilated RA and LA  The patioent developed rapid Atrial fib yesterday7 and ended up back on  A Cardizem drip. Last ECHO though limited in scope dod not show any evidence of valvualr vegetations. Patien remians innAtrial fib with a controlled response  Endocrine The patient had a brief episode of thyrotoxicosis. His free T4 is going down. TSH still remains undetectable.   SSI  I have increased lantus from 6-10 units. Blood sugar are elevated. I am going to stop steroids. We are bumping up Lantus. if this is unsatisfactory her may need an insulin drip.  Hypernatremia  Resolved  Summary:: In short we have a patient who presented originally with SDH. His hospital course was complicated by thyrotoxicosis. He developed worsening mental status in the past few days. I feel he has a respiratory infx. I am hopeful that once we have taken off sedation patient will be more repsonsive at this point. If not we may need to repeat his MRI and I will discuss further with neurology.

## 2018-01-09 NOTE — Progress Notes (Signed)
Patient's daughter Caryl Asp has FMLA paperwork in the chart that she needs filled out by MD. Dyane Dustman, Rande Brunt, RN

## 2018-01-10 ENCOUNTER — Inpatient Hospital Stay (HOSPITAL_COMMUNITY): Payer: Medicare Other

## 2018-01-10 LAB — CBC WITH DIFFERENTIAL/PLATELET
BASOS ABS: 0 10*3/uL (ref 0.0–0.1)
Basophils Relative: 0 %
EOS PCT: 0 %
Eosinophils Absolute: 0 10*3/uL (ref 0.0–0.7)
HCT: 25.2 % — ABNORMAL LOW (ref 39.0–52.0)
HEMOGLOBIN: 7.6 g/dL — AB (ref 13.0–17.0)
LYMPHS ABS: 0.4 10*3/uL — AB (ref 0.7–4.0)
LYMPHS PCT: 3 %
MCH: 26.4 pg (ref 26.0–34.0)
MCHC: 30.2 g/dL (ref 30.0–36.0)
MCV: 87.5 fL (ref 78.0–100.0)
Monocytes Absolute: 0.4 10*3/uL (ref 0.1–1.0)
Monocytes Relative: 2 %
NEUTROS PCT: 95 %
Neutro Abs: 16 10*3/uL — ABNORMAL HIGH (ref 1.7–7.7)
PLATELETS: 292 10*3/uL (ref 150–400)
RBC: 2.88 MIL/uL — ABNORMAL LOW (ref 4.22–5.81)
RDW: 17 % — ABNORMAL HIGH (ref 11.5–15.5)
WBC: 16.8 10*3/uL — ABNORMAL HIGH (ref 4.0–10.5)

## 2018-01-10 LAB — BLOOD GAS, ARTERIAL
ACID-BASE DEFICIT: 4.4 mmol/L — AB (ref 0.0–2.0)
Bicarbonate: 20.8 mmol/L (ref 20.0–28.0)
DRAWN BY: 36496
FIO2: 40
LHR: 14 {breaths}/min
MECHVT: 570 mL
O2 SAT: 97.9 %
PATIENT TEMPERATURE: 99.9
PEEP: 5 cmH2O
PO2 ART: 116 mmHg — AB (ref 83.0–108.0)
pCO2 arterial: 44.2 mmHg (ref 32.0–48.0)
pH, Arterial: 7.299 — ABNORMAL LOW (ref 7.350–7.450)

## 2018-01-10 LAB — GLUCOSE, CAPILLARY
GLUCOSE-CAPILLARY: 144 mg/dL — AB (ref 65–99)
GLUCOSE-CAPILLARY: 146 mg/dL — AB (ref 65–99)
GLUCOSE-CAPILLARY: 172 mg/dL — AB (ref 65–99)
GLUCOSE-CAPILLARY: 181 mg/dL — AB (ref 65–99)
GLUCOSE-CAPILLARY: 191 mg/dL — AB (ref 65–99)
GLUCOSE-CAPILLARY: 230 mg/dL — AB (ref 65–99)
GLUCOSE-CAPILLARY: 311 mg/dL — AB (ref 65–99)
GLUCOSE-CAPILLARY: 316 mg/dL — AB (ref 65–99)
GLUCOSE-CAPILLARY: 325 mg/dL — AB (ref 65–99)
Glucose-Capillary: 290 mg/dL — ABNORMAL HIGH (ref 65–99)
Glucose-Capillary: 306 mg/dL — ABNORMAL HIGH (ref 65–99)
Glucose-Capillary: 263 mg/dL — ABNORMAL HIGH (ref 65–99)
Glucose-Capillary: 161 mg/dL — ABNORMAL HIGH (ref 65–99)
Glucose-Capillary: 137 mg/dL — ABNORMAL HIGH (ref 65–99)
Glucose-Capillary: 131 mg/dL — ABNORMAL HIGH (ref 65–99)
Glucose-Capillary: 143 mg/dL — ABNORMAL HIGH (ref 65–99)

## 2018-01-10 LAB — BASIC METABOLIC PANEL
ANION GAP: 7 (ref 5–15)
BUN: 65 mg/dL — AB (ref 6–20)
CHLORIDE: 112 mmol/L — AB (ref 101–111)
CO2: 18 mmol/L — ABNORMAL LOW (ref 22–32)
Calcium: 10 mg/dL (ref 8.9–10.3)
Creatinine, Ser: 1.26 mg/dL — ABNORMAL HIGH (ref 0.61–1.24)
GFR calc Af Amer: >60 mL/min (ref >60)
GFR, EST NON AFRICAN AMERICAN: 57 mL/min — AB (ref >60)
Glucose, Bld: 541 mg/dL (ref 65–99)
POTASSIUM: 4.9 mmol/L (ref 3.5–5.1)
SODIUM: 137 mmol/L (ref 135–145)

## 2018-01-10 LAB — ANTI-JO 1 ANTIBODY, IGG

## 2018-01-10 LAB — VITAMIN B1: VITAMIN B1 (THIAMINE): 197 nmol/L (ref 66.5–200.0)

## 2018-01-10 MED ORDER — GADOBENATE DIMEGLUMINE 529 MG/ML IV SOLN
20.0000 mL | Freq: Once | INTRAVENOUS | Status: AC | PRN
  Administered 2018-01-10: 20 mL via INTRAVENOUS

## 2018-01-10 MED ORDER — SODIUM CHLORIDE 0.9 % IV SOLN
2000.0000 mg | Freq: Once | INTRAVENOUS | Status: AC
  Administered 2018-01-10: 2000 mg via INTRAVENOUS
  Filled 2018-01-10: qty 2000

## 2018-01-10 MED ORDER — VANCOMYCIN HCL IN DEXTROSE 750-5 MG/150ML-% IV SOLN
750.0000 mg | Freq: Two times a day (BID) | INTRAVENOUS | Status: DC
  Administered 2018-01-11 – 2018-01-12 (×3): 750 mg via INTRAVENOUS
  Filled 2018-01-10 (×4): qty 150

## 2018-01-10 MED ORDER — SODIUM CHLORIDE 0.9 % IV SOLN: 2.0000 g | Freq: Two times a day (BID) | INTRAVENOUS | Status: DC

## 2018-01-10 MED ORDER — CHLORHEXIDINE GLUCONATE CLOTH 2 % EX PADS
6.0000 | MEDICATED_PAD | Freq: Every day | CUTANEOUS | Status: DC
  Administered 2018-01-10 – 2018-01-18 (×9): 6 via TOPICAL

## 2018-01-10 MED ORDER — SODIUM CHLORIDE 0.9 % IV SOLN
2.0000 g | Freq: Two times a day (BID) | INTRAVENOUS | Status: DC
  Administered 2018-01-10 – 2018-01-12 (×4): 2 g via INTRAVENOUS
  Filled 2018-01-10 (×4): qty 20

## 2018-01-10 MED ORDER — METHYLPREDNISOLONE SODIUM SUCC 40 MG IJ SOLR
40.0000 mg | Freq: Two times a day (BID) | INTRAMUSCULAR | Status: DC
  Administered 2018-01-10 – 2018-01-15 (×10): 40 mg via INTRAVENOUS
  Filled 2018-01-10 (×10): qty 1

## 2018-01-10 MED ORDER — INSULIN ASPART 100 UNIT/ML ~~LOC~~ SOLN
0.0000 [IU] | SUBCUTANEOUS | Status: DC
  Administered 2018-01-11: 5 [IU] via SUBCUTANEOUS
  Administered 2018-01-11: 2 [IU] via SUBCUTANEOUS

## 2018-01-10 MED ORDER — DEXMEDETOMIDINE HCL IN NACL 200 MCG/50ML IV SOLN
0.4000 ug/kg/h | INTRAVENOUS | Status: DC
  Administered 2018-01-10: 0.4 ug/kg/h via INTRAVENOUS
  Administered 2018-01-11: 0.5 ug/kg/h via INTRAVENOUS
  Administered 2018-01-11: 0.2 ug/kg/h via INTRAVENOUS
  Administered 2018-01-11: 0.5 ug/kg/h via INTRAVENOUS
  Administered 2018-01-11: 0.6 ug/kg/h via INTRAVENOUS
  Administered 2018-01-11: 0.4 ug/kg/h via INTRAVENOUS
  Administered 2018-01-11: 0.7 ug/kg/h via INTRAVENOUS
  Filled 2018-01-10 (×9): qty 50

## 2018-01-10 MED ORDER — SODIUM CHLORIDE 0.9 % IV SOLN
2.0000 g | INTRAVENOUS | Status: DC
Start: 2018-01-10 — End: 2018-01-12
  Administered 2018-01-10 – 2018-01-12 (×13): 2 g via INTRAVENOUS
  Filled 2018-01-10 (×15): qty 2000

## 2018-01-10 MED ORDER — INSULIN GLARGINE 100 UNIT/ML ~~LOC~~ SOLN
15.0000 [IU] | Freq: Every day | SUBCUTANEOUS | Status: DC
  Administered 2018-01-10: 15 [IU] via SUBCUTANEOUS
  Filled 2018-01-10 (×2): qty 0.15

## 2018-01-10 NOTE — Progress Notes (Signed)
Subjective: Awake with depressed mentation of propofol. Follows simple commands per nursing such as "thumbs up".  On Friday 4/12 additional history was obtained from family: He has had gradually increasing mood lability as well as paranoia and persecutory delusions since about one year ago. In mid March he precipitously declined, with intermittent dizziness, an episode where he fell striking his head and intermittent episodes of dysarthric speech as well as acutely worsened swallowing function. Of note, his dysarthric speech improved with steroid treatment during this admission, then worsened after steroids were held.    Objective: Current vital signs: BP 110/87   Pulse (!) 104   Temp 99.5 F (37.5 C) (Axillary)   Resp 20   Ht '5\' 9"'$  (1.753 m)   Wt 114.4 kg (252 lb 3.3 oz)   SpO2 100%   BMI 37.24 kg/m  Vital signs in last 24 hours: Temp:  [99.5 F (37.5 C)-100.3 F (37.9 C)] 99.5 F (37.5 C) (04/13 0800) Pulse Rate:  [86-129] 104 (04/13 0904) Resp:  [16-34] 20 (04/13 0904) BP: (96-168)/(48-87) 110/87 (04/13 0904) SpO2:  [99 %-100 %] 100 % (04/13 0900) FiO2 (%):  [40 %] 40 % (04/13 0905) Weight:  [114.4 kg (252 lb 3.3 oz)] 114.4 kg (252 lb 3.3 oz) (04/13 0400)  Intake/Output from previous day: 04/12 0701 - 04/13 0700 In: 5277.5 [I.V.:3992.5; NG/GT:985; IV Piggyback:300] Out: 2800 [Urine:2400; Stool:400] Intake/Output this shift: No intake/output data recorded. Nutritional status: Diet NPO time specified  Neurologic Exam: Ment: Awake with eyes open. Not responding to commands. Localizes to sternal rub with RUE.  CN: PERRL. Does not track visual stimuli. Intermittently will weakly blink to threat. Eyes midposition. Face symmetric.  Motor/Sensory: Moves RUE to noxious. Not moving LUE to light noxious; moves to deep noxious.  Withdraws bilateral lower extremities to plantar stimluation, right more than left.  Reflexes: Bilateral brachioradialis 2+. Bilateral patellae 1+. Toes  upgoing bilaterally.    Lab Results: Results for orders placed or performed during the hospital encounter of 12/09/2017 (from the past 48 hour(s))  Glucose, capillary     Status: Abnormal   Collection Time: 01/08/18 10:13 AM  Result Value Ref Range   Glucose-Capillary 184 (H) 65 - 99 mg/dL  Glucose, capillary     Status: Abnormal   Collection Time: 01/08/18 11:08 AM  Result Value Ref Range   Glucose-Capillary 174 (H) 65 - 99 mg/dL  Ammonia     Status: None   Collection Time: 01/08/18 11:10 AM  Result Value Ref Range   Ammonia 22 9 - 35 umol/L    Comment: Performed at [facility], [place] [address], [place], [place] [address]  Hepatic function panel     Status: Abnormal   Collection Time: 01/08/18 11:10 AM  Result Value Ref Range   Total Protein 5.5 (L) 6.5 - 8.1 g/dL   Albumin 2.0 (L) 3.5 - 5.0 g/dL   AST 19 15 - 41 U/L   ALT 72 (H) 17 - 63 U/L   Alkaline Phosphatase 75 38 - 126 U/L   Total Bilirubin 0.9 0.3 - 1.2 mg/dL   Bilirubin, Direct 0.4 0.1 - 0.5 mg/dL   Indirect Bilirubin 0.5 0.3 - 0.9 mg/dL    Comment: Performed at [facility] [address], [place], [place] [address]  Glucose, capillary     Status: Abnormal   Collection Time: 01/08/18 12:14 PM  Result Value Ref Range   Glucose-Capillary 158 (H) 65 - 99 mg/dL  Glucose, capillary  Status: Abnormal   Collection Time: 01/08/18  1:17 PM  Result Value Ref Range   Glucose-Capillary 156 (H) 65 - 99 mg/dL  Glucose, capillary     Status: Abnormal   Collection Time: 01/08/18  2:16 PM  Result Value Ref Range   Glucose-Capillary 134 (H) 65 - 99 mg/dL  Glucose, capillary     Status: Abnormal   Collection Time: 01/08/18  3:26 PM  Result Value Ref Range   Glucose-Capillary 154 (H) 65 - 99 mg/dL  Glucose, capillary     Status: Abnormal   Collection Time: 01/08/18  4:00 PM  Result Value Ref Range   Glucose-Capillary 161 (H) 65 - 99 mg/dL  Glucose, capillary     Status: Abnormal   Collection Time: 01/08/18   4:55 PM  Result Value Ref Range   Glucose-Capillary 196 (H) 65 - 99 mg/dL  Glucose, capillary     Status: Abnormal   Collection Time: 01/08/18  6:03 PM  Result Value Ref Range   Glucose-Capillary 207 (H) 65 - 99 mg/dL  Glucose, capillary     Status: Abnormal   Collection Time: 01/08/18  7:08 PM  Result Value Ref Range   Glucose-Capillary 205 (H) 65 - 99 mg/dL  Glucose, capillary     Status: Abnormal   Collection Time: 01/08/18  8:14 PM  Result Value Ref Range   Glucose-Capillary 193 (H) 65 - 99 mg/dL  Glucose, capillary     Status: Abnormal   Collection Time: 01/08/18  9:24 PM  Result Value Ref Range   Glucose-Capillary 171 (H) 65 - 99 mg/dL  Glucose, capillary     Status: Abnormal   Collection Time: 01/08/18 10:38 PM  Result Value Ref Range   Glucose-Capillary 170 (H) 65 - 99 mg/dL  Glucose, capillary     Status: Abnormal   Collection Time: 01/08/18 11:45 PM  Result Value Ref Range   Glucose-Capillary 178 (H) 65 - 99 mg/dL  Glucose, capillary     Status: Abnormal   Collection Time: 01/09/18 12:51 AM  Result Value Ref Range   Glucose-Capillary 178 (H) 65 - 99 mg/dL  Glucose, capillary     Status: Abnormal   Collection Time: 01/09/18  1:59 AM  Result Value Ref Range   Glucose-Capillary 171 (H) 65 - 99 mg/dL  Glucose, capillary     Status: Abnormal   Collection Time: 01/09/18  3:06 AM  Result Value Ref Range   Glucose-Capillary 173 (H) 65 - 99 mg/dL  Blood gas, arterial     Status: Abnormal   Collection Time: 01/09/18  3:53 AM  Result Value Ref Range   FIO2 40.00    Delivery systems VENTILATOR    Mode PRESSURE REGULATED VOLUME CONTROL    VT 570 mL   LHR 14.0 resp/min   Peep/cpap 5.0 cm H20   pH, Arterial 7.363 7.350 - 7.450   pCO2 arterial 39.6 32.0 - 48.0 mmHg   pO2, Arterial 137 (H) 83.0 - 108.0 mmHg   Bicarbonate 21.9 20.0 - 28.0 mmol/L   Acid-base deficit 2.7 (H) 0.0 - 2.0 mmol/L   O2 Saturation 99.0 %   Patient temperature 99.0    Collection site LEFT RADIAL     Drawn by [id number]    Sample type ARTERIAL DRAW    Allens test (pass/fail) PASS PASS  Glucose, capillary     Status: Abnormal   Collection Time: 01/09/18  4:03 AM  Result Value Ref Range   Glucose-Capillary 139 (H) 65 - 99 mg/dL  Basic metabolic panel     Status: Abnormal   Collection Time: 01/09/18  4:13 AM  Result Value Ref Range   Sodium 142 135 - 145 mmol/L   Potassium 4.4 3.5 - 5.1 mmol/L   Chloride 114 (H) 101 - 111 mmol/L   CO2 21 (L) 22 - 32 mmol/L   Glucose, Bld 166 (H) 65 - 99 mg/dL   BUN 45 (H) 6 - 20 mg/dL   Creatinine, Ser 1.30 (H) 0.61 - 1.24 mg/dL   Calcium 10.0 8.9 - 10.3 mg/dL   GFR calc non Af Amer 54 (L) >60 mL/min   GFR calc Af Amer >60 >60 mL/min    Comment: (NOTE) The eGFR has been calculated using the CKD EPI equation. This calculation has not been validated in all clinical situations. eGFR's persistently <60 mL/min signify possible Chronic Kidney Disease.    Anion gap 7 5 - 15    Comment: Performed at [facility] [address], [place], [place] [address]  CBC with Differential/Platelet     Status: Abnormal   Collection Time: 01/09/18  4:13 AM  Result Value Ref Range   WBC 21.1 (H) 4.0 - 10.5 K/uL   RBC 3.28 (L) 4.22 - 5.81 MIL/uL   Hemoglobin 8.8 (L) 13.0 - 17.0 g/dL   HCT 28.6 (L) 39.0 - 52.0 %   MCV 87.2 78.0 - 100.0 fL   MCH 26.8 26.0 - 34.0 pg   MCHC 30.8 30.0 - 36.0 g/dL   RDW 16.6 (H) 11.5 - 15.5 %   Platelets 266 150 - 400 K/uL   Neutrophils Relative % 98 %   Lymphocytes Relative 2 %   Monocytes Relative 0 %   Eosinophils Relative 0 %   Basophils Relative 0 %   Band Neutrophils 0 %   Metamyelocytes Relative 0 %   Myelocytes 0 %   Promyelocytes Relative 0 %   Blasts 0 %   nRBC 0 0 /100 WBC   Other 0 %   Neutro Abs 20.7 (H) 1.7 - 7.7 K/uL   Lymphs Abs 0.4 (L) 0.7 - 4.0 K/uL   Monocytes Absolute 0.0 (L) 0.1 - 1.0 K/uL   Eosinophils Absolute 0.0 0.0 - 0.7 K/uL   Basophils Absolute 0.0 0.0 - 0.1 K/uL   Smear Review MORPHOLOGY  UNREMARKABLE     Comment: Performed at [facility], [place] [address], [place], [place] [address]  Magnesium     Status: None   Collection Time: 01/09/18  4:13 AM  Result Value Ref Range   Magnesium 1.9 1.7 - 2.4 mg/dL    Comment: Performed at [facility] [address] [place], [place] [address]  Phosphorus     Status: None   Collection Time: 01/09/18  4:13 AM  Result Value Ref Range   Phosphorus 2.9 2.5 - 4.6 mg/dL    Comment: Performed at [facility] [address], [place], [place] [address]  Troponin I     Status: Abnormal   Collection Time: 01/09/18  4:13 AM  Result Value Ref Range   Troponin I 0.13 (HH) <0.03 ng/mL    Comment: CRITICAL RESULT CALLED TO, READ BACK BY AND VERIFIED WITH: TRAVIS B,RN 01/09/18 0508 WAYK Performed at [facility] [address], [place],  [address]   Glucose, capillary     Status: Abnormal   Collection Time: 01/09/18  5:06 AM  Result Value Ref Range   Glucose-Capillary 155 (H) 65 -  99 mg/dL  Glucose, capillary     Status: Abnormal   Collection Time: 01/09/18  6:11 AM  Result Value Ref Range   Glucose-Capillary 175 (H) 65 - 99 mg/dL  Glucose, capillary     Status: Abnormal   Collection Time: 01/09/18  7:17 AM  Result Value Ref Range   Glucose-Capillary 180 (H) 65 - 99 mg/dL  Glucose, capillary     Status: Abnormal   Collection Time: 01/09/18  7:33 AM  Result Value Ref Range   Glucose-Capillary 199 (H) 65 - 99 mg/dL  Glucose, capillary     Status: Abnormal   Collection Time: 01/09/18  8:04 AM  Result Value Ref Range   Glucose-Capillary 174 (H) 65 - 99 mg/dL  Glucose, capillary     Status: Abnormal   Collection Time: 01/09/18  9:08 AM  Result Value Ref Range   Glucose-Capillary 188 (H) 65 - 99 mg/dL  Glucose, capillary     Status: Abnormal   Collection Time: 01/09/18 10:03 AM  Result Value Ref Range   Glucose-Capillary 195 (H) 65 - 99 mg/dL  Glucose, capillary     Status: Abnormal   Collection Time:  01/09/18 11:06 AM  Result Value Ref Range   Glucose-Capillary 189 (H) 65 - 99 mg/dL  Glucose, capillary     Status: Abnormal   Collection Time: 01/09/18 12:11 PM  Result Value Ref Range   Glucose-Capillary 186 (H) 65 - 99 mg/dL  Glucose, capillary     Status: Abnormal   Collection Time: 01/09/18  1:11 PM  Result Value Ref Range   Glucose-Capillary 162 (H) 65 - 99 mg/dL  Glucose, capillary     Status: Abnormal   Collection Time: 01/09/18  2:02 PM  Result Value Ref Range   Glucose-Capillary 164 (H) 65 - 99 mg/dL  Triglycerides     Status: Abnormal   Collection Time: 01/09/18  2:40 PM  Result Value Ref Range   Triglycerides 305 (H) <150 mg/dL    Comment: Performed at [facility], [place] [address], [place], [place] [address]  Glucose, capillary     Status: Abnormal   Collection Time: 01/09/18  3:14 PM  Result Value Ref Range   Glucose-Capillary 164 (H) 65 - 99 mg/dL  Glucose, capillary     Status: Abnormal   Collection Time: 01/09/18  4:25 PM  Result Value Ref Range   Glucose-Capillary 166 (H) 65 - 99 mg/dL  Glucose, capillary     Status: Abnormal   Collection Time: 01/09/18  5:26 PM  Result Value Ref Range   Glucose-Capillary 141 (H) 65 - 99 mg/dL  Glucose, capillary     Status: Abnormal   Collection Time: 01/09/18  7:17 PM  Result Value Ref Range   Glucose-Capillary 127 (H) 65 - 99 mg/dL  Glucose, capillary     Status: Abnormal   Collection Time: 01/09/18  8:25 PM  Result Value Ref Range   Glucose-Capillary 170 (H) 65 - 99 mg/dL  Glucose, capillary     Status: Abnormal   Collection Time: 01/09/18  8:40 PM  Result Value Ref Range   Glucose-Capillary 184 (H) 65 - 99 mg/dL  Glucose, capillary     Status: Abnormal   Collection Time: 01/09/18  9:47 PM  Result Value Ref Range   Glucose-Capillary 156 (H) 65 - 99 mg/dL  Glucose, capillary     Status: Abnormal   Collection Time: 01/09/18 11:00 PM  Result Value Ref Range   Glucose-Capillary 154 (H) 65 -  99 mg/dL  Glucose,  capillary     Status: Abnormal   Collection Time: 01/10/18 12:09 AM  Result Value Ref Range   Glucose-Capillary 172 (H) 65 - 99 mg/dL  Blood gas, arterial     Status: Abnormal   Collection Time: 01/10/18  4:15 AM  Result Value Ref Range   FIO2 40.00    Delivery systems VENTILATOR    Mode PRESSURE REGULATED VOLUME CONTROL    VT 570 mL   LHR 14 resp/min   Peep/cpap 5.0 cm H20   pH, Arterial 7.299 (L) 7.350 - 7.450   pCO2 arterial 44.2 32.0 - 48.0 mmHg   pO2, Arterial 116 (H) 83.0 - 108.0 mmHg   Bicarbonate 20.8 20.0 - 28.0 mmol/L   Acid-base deficit 4.4 (H) 0.0 - 2.0 mmol/L   O2 Saturation 97.9 %   Patient temperature 99.9    Collection site LEFT RADIAL    Drawn by [phone]    Sample type ARTERIAL    Allens test (pass/fail) PASS PASS  Glucose, capillary     Status: Abnormal   Collection Time: 01/10/18  4:21 AM  Result Value Ref Range   Glucose-Capillary 316 (H) 65 - 99 mg/dL  Basic metabolic panel     Status: Abnormal   Collection Time: 01/10/18  5:15 AM  Result Value Ref Range   Sodium 137 135 - 145 mmol/L   Potassium 4.9 3.5 - 5.1 mmol/L   Chloride 112 (H) 101 - 111 mmol/L   CO2 18 (L) 22 - 32 mmol/L   Glucose, Bld 541 (HH) 65 - 99 mg/dL    Comment: CRITICAL RESULT CALLED TO, READ BACK BY AND VERIFIED WITH: ARMSTRONG J,RN 01/10/18 0608 WAYK    BUN 65 (H) 6 - 20 mg/dL   Creatinine, Ser 1.26 (H) 0.61 - 1.24 mg/dL   Calcium 10.0 8.9 - 10.3 mg/dL   GFR calc non Af Amer 57 (L) >60 mL/min   GFR calc Af Amer >60 >60 mL/min    Comment: (NOTE) The eGFR has been calculated using the CKD EPI equation. This calculation has not been validated in all clinical situations. eGFR's persistently <60 mL/min signify possible Chronic Kidney Disease.    Anion gap 7 5 - 15    Comment: Performed at [facility] [address], [place], [place] [address]  CBC with Differential/Platelet     Status: Abnormal   Collection Time: 01/10/18  5:15 AM  Result Value Ref Range   WBC 16.8 (H)  4.0 - 10.5 K/uL   RBC 2.88 (L) 4.22 - 5.81 MIL/uL   Hemoglobin 7.6 (L) 13.0 - 17.0 g/dL   HCT 25.2 (L) 39.0 - 52.0 %   MCV 87.5 78.0 - 100.0 fL   MCH 26.4 26.0 - 34.0 pg   MCHC 30.2 30.0 - 36.0 g/dL   RDW 17.0 (H) 11.5 - 15.5 %   Platelets 292 150 - 400 K/uL   Neutrophils Relative % 95 %   Neutro Abs 16.0 (H) 1.7 - 7.7 K/uL   Lymphocytes Relative 3 %   Lymphs Abs 0.4 (L) 0.7 - 4.0 K/uL   Monocytes Relative 2 %   Monocytes Absolute 0.4 0.1 - 1.0 K/uL   Eosinophils Relative 0 %   Eosinophils Absolute 0.0 0.0 - 0.7 K/uL   Basophils Relative 0 %   Basophils Absolute 0.0 0.0 - 0.1 K/uL    Comment: Performed at [facility], [address], [place], [place] [address]  Glucose, capillary  Status: Abnormal   Collection Time: 01/10/18  6:38 AM  Result Value Ref Range   Glucose-Capillary 325 (H) 65 - 99 mg/dL  Glucose, capillary     Status: Abnormal   Collection Time: 01/10/18  7:48 AM  Result Value Ref Range   Glucose-Capillary 311 (H) 65 - 99 mg/dL    Recent Results (from the past 240 hour(s))  Culture, blood (Routine X 2) w Reflex to ID Panel     Status: None   Collection Time: 01/03/18  1:17 PM  Result Value Ref Range Status   Specimen Description BLOOD RIGHT ANTECUBITAL  Final   Special Requests   Final    BOTTLES DRAWN AEROBIC ONLY Blood Culture adequate volume   Culture   Final    NO GROWTH 5 DAYS Performed at [facility], [place] [address], [place], [place] [address]    Report Status 01/08/2018 FINAL  Final  Culture, blood (Routine X 2) w Reflex to ID Panel     Status: None   Collection Time: 01/03/18  1:20 PM  Result Value Ref Range Status   Specimen Description BLOOD RIGHT ANTECUBITAL  Final   Special Requests   Final    BOTTLES DRAWN AEROBIC ONLY Blood Culture adequate volume   Culture   Final    NO GROWTH 5 DAYS Performed at [facility], [place] [address] [place], [place] [address]    Report Status 01/08/2018 FINAL  Final  Culture, blood  (routine x 2)     Status: None (Preliminary result)   Collection Time: 01/06/18  9:32 AM  Result Value Ref Range Status   Specimen Description BLOOD LEFT ANTECUBITAL  Final   Special Requests   Final    BOTTLES DRAWN AEROBIC AND ANAEROBIC Blood Culture adequate volume   Culture   Final    NO GROWTH 3 DAYS Performed at [facility], [place] [address], [place], [place] [address]    Report Status PENDING  Incomplete  Culture, blood (routine x 2)     Status: Abnormal   Collection Time: 01/06/18  9:43 AM  Result Value Ref Range Status   Specimen Description BLOOD HAND LEFT  Final   Special Requests   Final    BOTTLES DRAWN AEROBIC AND ANAEROBIC Blood Culture adequate volume   Culture  Setup Time   Final    GRAM POSITIVE COCCI IN BOTH AEROBIC AND ANAEROBIC BOTTLES Organism ID to follow CRITICAL RESULT CALLED TO, READ BACK BY AND VERIFIED WITH: Francene Boyers PharmD 10:45 01/07/18 ([email or web address])    Culture (A)  Final    STAPHYLOCOCCUS SPECIES (COAGULASE NEGATIVE) THE SIGNIFICANCE OF ISOLATING THIS ORGANISM FROM A SINGLE SET OF BLOOD CULTURES WHEN MULTIPLE SETS ARE DRAWN IS UNCERTAIN. PLEASE NOTIFY THE MICROBIOLOGY DEPARTMENT WITHIN ONE WEEK IF SPECIATION AND SENSITIVITIES ARE REQUIRED. Performed at [facility], [place] [address], [place], [place] [address]    Report Status 01/09/2018 FINAL  Final  Blood Culture ID Panel (Reflexed)     Status: Abnormal   Collection Time: 01/06/18  9:43 AM  Result Value Ref Range Status   Enterococcus species NOT DETECTED NOT DETECTED Final   Listeria monocytogenes NOT DETECTED NOT DETECTED Final   Staphylococcus species DETECTED (A) NOT DETECTED Final    Comment: Methicillin (oxacillin) susceptible coagulase negative staphylococcus. Possible blood culture contaminant (unless isolated from more than one blood culture draw or clinical case suggests pathogenicity). No antibiotic treatment is indicated for blood  culture contaminants. CRITICAL RESULT  CALLED TO,  READ BACK BY AND VERIFIED WITH: Francene Boyers PharmD 10:45 01/07/18 ([email or web address])    Staphylococcus aureus NOT DETECTED NOT DETECTED Final   Methicillin resistance NOT DETECTED NOT DETECTED Final   Streptococcus species NOT DETECTED NOT DETECTED Final   Streptococcus agalactiae NOT DETECTED NOT DETECTED Final   Streptococcus pneumoniae NOT DETECTED NOT DETECTED Final   Streptococcus pyogenes NOT DETECTED NOT DETECTED Final   Acinetobacter baumannii NOT DETECTED NOT DETECTED Final   Enterobacteriaceae species NOT DETECTED NOT DETECTED Final   Enterobacter cloacae complex NOT DETECTED NOT DETECTED Final   Escherichia coli NOT DETECTED NOT DETECTED Final   Klebsiella oxytoca NOT DETECTED NOT DETECTED Final   Klebsiella pneumoniae NOT DETECTED NOT DETECTED Final   Proteus species NOT DETECTED NOT DETECTED Final   Serratia marcescens NOT DETECTED NOT DETECTED Final   Haemophilus influenzae NOT DETECTED NOT DETECTED Final   Neisseria meningitidis NOT DETECTED NOT DETECTED Final   Pseudomonas aeruginosa NOT DETECTED NOT DETECTED Final   Candida albicans NOT DETECTED NOT DETECTED Final   Candida glabrata NOT DETECTED NOT DETECTED Final   Candida krusei NOT DETECTED NOT DETECTED Final   Candida parapsilosis NOT DETECTED NOT DETECTED Final   Candida tropicalis NOT DETECTED NOT DETECTED Final    Comment: Performed at [facility], [address] [place], [place] [address]  Culture, respiratory (NON-Expectorated)     Status: None   Collection Time: 01/06/18  3:10 PM  Result Value Ref Range Status   Specimen Description TRACHEAL ASPIRATE  Final   Special Requests NONE  Final   Gram Stain   Final    ABUNDANT WBC PRESENT,BOTH PMN AND MONONUCLEAR MODERATE GRAM NEGATIVE RODS FEW GRAM POSITIVE COCCI Performed at [facility], [place] [address], [place], [place] [address]    Culture MODERATE KLEBSIELLA PNEUMONIAE  Final   Report Status 01/08/2018 FINAL  Final   Organism ID, Bacteria KLEBSIELLA  PNEUMONIAE  Final      Susceptibility   Klebsiella pneumoniae - MIC*    AMPICILLIN RESISTANT Resistant     CEFAZOLIN <=4 SENSITIVE Sensitive     CEFEPIME <=1 SENSITIVE Sensitive     CEFTAZIDIME <=1 SENSITIVE Sensitive     CEFTRIAXONE <=1 SENSITIVE Sensitive     CIPROFLOXACIN <=0.25 SENSITIVE Sensitive     GENTAMICIN <=1 SENSITIVE Sensitive     IMIPENEM <=0.25 SENSITIVE Sensitive     TRIMETH/SULFA <=20 SENSITIVE Sensitive     AMPICILLIN/SULBACTAM 4 SENSITIVE Sensitive     PIP/TAZO <=4 SENSITIVE Sensitive     Extended ESBL NEGATIVE Sensitive     * MODERATE KLEBSIELLA PNEUMONIAE    Lipid Panel Recent Labs    01/09/18 1440  TRIG 305*    Studies/Results: Mr Virgel Paling Wo Contrast  Result Date: 01/10/2018 CLINICAL DATA:  Follow-up evaluation. Encephalopathic. On anticoagulation for atrial fibrillation. History of colon cancer, hypertension, diabetes. EXAM: MRI HEAD WITHOUT AND WITH CONTRAST MRA HEAD WITHOUT CONTRAST TECHNIQUE: Multiplanar, multiecho pulse sequences of the brain and surrounding structures were obtained without and with intravenous contrast. Angiographic images of the head were obtained using MRA technique without contrast. CONTRAST:  52m MULTIHANCE GADOBENATE DIMEGLUMINE 529 MG/ML IV SOLN COMPARISON:  CT HEAD January 07, 2018 and MRI of the head December 29, 2017 FINDINGS: MRI HEAD FINDINGS INTRACRANIAL CONTENTS: Relatively similar holohemispheric RIGHT subdural hematoma measuring to 12 mm with dependent T1 shortening. Mild similar mass effect on RIGHT frontal lobe. No significant midline shift. Near complete dissolution of LEFT subdural hematoma. Trace  residual falcotentorial subdural hematoma. Trace RIGHT subarachnoid hemorrhage versus stagnant cortical veins. Mild smooth dural enhancement. Patchy supratentorial white matter FLAIR T2 hyperintensities. Prominent punctate T2 hyperintensities in the basal ganglia associated with chronic small vessel ischemic disease. Moderate  parenchymal brain volume loss. No hydrocephalus. No reduced diffusion to suggest acute ischemia. No abnormal parenchymal enhancement. VASCULAR: Normal major intracranial vascular flow voids present at skull base. SKULL AND UPPER CERVICAL SPINE: No abnormal sellar expansion. No suspicious calvarial bone marrow signal. Craniocervical junction maintained. SINUSES/ORBITS: Trace mastoid effusions. Severe RIGHT sphenoid sinusitis.The included ocular globes and orbital contents are non-suspicious. OTHER: Patient is edentulous.  Life-support lines in place. MRA HEAD FINDINGS ANTERIOR CIRCULATION: Normal flow related enhancement of the included cervical, petrous, cavernous and supraclinoid internal carotid arteries. Elongated, fenestrated anterior communicating artery. Patent anterior and middle cerebral arteries, including distal segments. No large vessel occlusion, flow limiting stenosis, aneurysm. POSTERIOR CIRCULATION: vertebral artery is dominant. Basilar artery is patent, with normal flow related enhancement of the main branch vessels. Patent posterior cerebral arteries. RIGHT PCOM origin infundibulum. No large vessel occlusion, flow limiting stenosis,  aneurysm. ANATOMIC VARIANTS: None. Source images and MIP images were reviewed. IMPRESSION: MRI head: 1. Similar acute to subacute RIGHT holohemispheric subdural hematoma. Trace residual falcotentorial subdural hematoma. Near complete resolution of LEFT subdural hematoma. 2. Moderate parenchymal brain volume loss and mild chronic small vessel ischemic disease. MRA head: 1. Negative noncontrast MRA head. Electronically Signed   By: Elon Alas M.D.   On: 01/10/2018 04:08   Mr Jeri Cos QJ Contrast  Result Date: 01/10/2018 CLINICAL DATA:  Follow-up evaluation. Encephalopathic. On anticoagulation for atrial fibrillation. History of colon cancer, hypertension, diabetes. EXAM: MRI HEAD WITHOUT AND WITH CONTRAST MRA HEAD WITHOUT CONTRAST TECHNIQUE: Multiplanar,  multiecho pulse sequences of the brain and surrounding structures were obtained without and with intravenous contrast. Angiographic images of the head were obtained using MRA technique without contrast. CONTRAST:  30m MULTIHANCE GADOBENATE DIMEGLUMINE 529 MG/ML IV SOLN COMPARISON:  CT HEAD January 07, 2018 and MRI of the head December 29, 2017 FINDINGS: MRI HEAD FINDINGS INTRACRANIAL CONTENTS: Relatively similar holohemispheric RIGHT subdural hematoma measuring to 12 mm with dependent T1 shortening. Mild similar mass effect on RIGHT frontal lobe. No significant midline shift. Near complete dissolution of LEFT subdural hematoma. Trace residual falcotentorial subdural hematoma. Trace RIGHT subarachnoid hemorrhage versus stagnant cortical veins. Mild smooth dural enhancement. Patchy supratentorial white matter FLAIR T2 hyperintensities. Prominent punctate T2 hyperintensities in the basal ganglia associated with chronic small vessel ischemic disease. Moderate parenchymal brain volume loss. No hydrocephalus. No reduced diffusion to suggest acute ischemia. No abnormal parenchymal enhancement. VASCULAR: Normal major intracranial vascular flow voids present at skull base. SKULL AND UPPER CERVICAL SPINE: No abnormal sellar expansion. No suspicious calvarial bone marrow signal. Craniocervical junction maintained. SINUSES/ORBITS: Trace mastoid effusions. Severe RIGHT sphenoid sinusitis.The included ocular globes and orbital contents are non-suspicious. OTHER: Patient is edentulous.  Life-support lines in place. MRA HEAD FINDINGS ANTERIOR CIRCULATION: Normal flow related enhancement of the included cervical, petrous, cavernous and supraclinoid internal carotid arteries. Elongated, fenestrated anterior communicating artery. Patent anterior and middle cerebral arteries, including distal segments. No large vessel occlusion, flow limiting stenosis, aneurysm. POSTERIOR CIRCULATION: vertebral artery is dominant. Basilar artery is patent,  with normal flow related enhancement of the main branch vessels. Patent posterior cerebral arteries. RIGHT PCOM origin infundibulum. No large vessel occlusion, flow limiting stenosis,  aneurysm. ANATOMIC VARIANTS: None. Source images and MIP images were reviewed. IMPRESSION: MRI head: 1. Similar acute to  subacute RIGHT holohemispheric subdural hematoma. Trace residual falcotentorial subdural hematoma. Near complete resolution of LEFT subdural hematoma. 2. Moderate parenchymal brain volume loss and mild chronic small vessel ischemic disease. MRA head: 1. Negative noncontrast MRA head. Electronically Signed   By: Elon Alas M.D.   On: 01/10/2018 04:08   Dg Chest Port 1 View  Result Date: 01/08/2018 CLINICAL DATA:  Dyspnea EXAM: PORTABLE CHEST 1 VIEW COMPARISON:  01/06/2018 FINDINGS: Cardiac shadow is stable. Endotracheal tube and nasogastric catheter are again seen as well as a right-sided PICC line. No focal infiltrate or sizable effusion is seen. No bony abnormality is noted. IMPRESSION: No acute abnormality noted. Electronically Signed   By: Inez Catalina M.D.   On: 01/08/2018 12:59    Medications:  Scheduled: . chlorhexidine gluconate (MEDLINE KIT)  15 mL Mouth Rinse BID  . Chlorhexidine Gluconate Cloth  6 each Topical Daily  . diltiazem  60 mg Oral Q8H  . feeding supplement (PRO-STAT SUGAR FREE 64)  60 mL Per Tube 5 X Daily  . ipratropium-albuterol  3 mL Nebulization TID  . mouth rinse  15 mL Mouth Rinse 10 times per day  . methimazole  20 mg Per Tube Q6H  . methylPREDNISolone (SOLU-MEDROL) injection  40 mg Intravenous Q6H  . multivitamin  15 mL Per Tube Daily  . pantoprazole (PROTONIX) IV  40 mg Intravenous Q24H  . potassium chloride  40 mEq Per Tube BID  . sodium chloride flush  10-40 mL Intracatheter Q12H  . thiamine injection  100 mg Intravenous Daily   Continuous: . sodium chloride 10 mL/hr at 01/10/18 0700  . sodium chloride 75 mL/hr at 01/10/18 0700  . cefTRIAXone (ROCEPHIN)   IV Stopped (01/09/18 1335)  . dextrose 5 % and 0.45% NaCl Stopped (01/10/18 0630)  . diltiazem (CARDIZEM) infusion 15 mg/hr (01/10/18 0700)  . feeding supplement (VITAL HIGH PROTEIN) 15 mL/hr at 01/10/18 0700  . fentaNYL infusion INTRAVENOUS 75 mcg/hr (01/10/18 0700)  . insulin (NOVOLIN-R) infusion 6.9 Units/hr (01/10/18 0909)  . lactated ringers Stopped (01/07/18 1712)  . levETIRAcetam Stopped (01/10/18 0530)  . propofol (DIPRIVAN) infusion 19.928 mcg/kg/min (01/10/18 0700)   Assessment  Encephalopathy in a 72 yr old male with right subdural fluid collection in conjunction with subarachnoid fluid on repeat MRI with mild to moderate mass effect.   1. Repeat MRI reveals similar acute to subacute RIGHT holohemispheric subdural hematoma. Trace residual falcotentorial subdural hematoma. There is near complete resolution of LEFT subdural hematoma. Of note, the Radiology report does not describe the proteinaceous-fluid-like signal characteristics seen extensively in the sulci on the FLAIR images (see below). Also of note, the MRI is negative for a rhombencephalitis or cerebellitis - the findings at these locations resulting in suspicion for these entities were artifactual.  2. MRA head: Negative noncontrast MRA head. 3. The subdural on MRI and CT is loculated. Given his lack of improvement, suspect that it may be infected. Would consult Neurosurgery to consider drainage and culture of the largest loculation, which is visible on MRI in the region of the right frontoparietal junction - drainage should be relatively safe and may be helpful in reducing mass effect.  4. Also noted on repeat MRI is extensive subarachnoid increased signal on FLAIR, suggestive of proteinaceous fluid. As there was no corresponding hyperdensity within the sulci on the recent CT, the fluid may represent pus/infection, or a malignant infiltrate such as from solid tumor metastasis (carcinomatous meningitis) or lymphoma (lymphomatous  meningitis). This is a second reason for  considering aspiration of the subdural fluid collections, not only for bacterial/fungal culture, but for microscopic evaluation and staining by pathology as well. Of note, if the subarachnoid proteinaceous fluid signal seen on MRI represents malignancy or infection, it would explain why the patient's mentation is disproportionately worse than expected given the initial CT findings.  5. Patient has not had any visible seizures. Initial EEG did not show evidence of ongoing seizure.Follow up EEGalso reveals no seizure but is clearly abnormal and c/w a diffuse encephalopathy.  6. A. fib with RVR  7. Review of clinical course: History of a fib, stage 3 chronic kidney disease, DM2, adenocarcinoma of colon s/p resection and current colostomy, history of renal cell carcinoma s/o resection who was admitted on 12/19/2017 for shortness of breath, supratherputic INR, afib with RVR and pulm edema. During his stay he was diagnosed with thyrotoxicosis, bilateral SDH and seizure. On 4/2 PCCM consulted for increased work of breathing, hypoxia, AMS. He was intubated and tx to ICU. He was transferred out of the ICU on 01/02/2018 and sent back to the ICU on 01/03/2018 when he became acutely dyspneic with tachypnea. Patient was started on BiPAP and became comfortable, more awake and became more interactive. Had to be reintubated again on 01/06/2018.   Recommendations: 1. Start empiric meningitis-dose antibiotics - continue ceftriaxone and add vancomycin and ampicillin 2. Neurosurgery consult for drainage of a portion or portions of the loculated subdural fluid collection. Send to pathology for bacterial/fungal cultures, AFB, gram stain, fungal stain, special stains for possible lymphomatous or carcinomatous meningitis.  3. If drained subdural fluid is negative for infection, consider empiric trial of high dose IV steroids, by switching current solumedrol dosing of 160 mg total per day to  1000 mg IV qd x 5 days, followed by a taper. Significant space occupied by the right subdural hematomas on MRI, therefore LP risks outweigh benefits due to risk of herniation.  4. Anti Jo-1 antibody is pending 5. Continue Keppra for seizure prophylaxis.  35 minutes spent in the neurological assessment and management of this critically ill patient.     LOS: 15 days   '@Electronically'$  signed: Dr. Kerney Elbe 01/10/2018  9:20 AM

## 2018-01-10 NOTE — Progress Notes (Signed)
RT NOTE:  Pt transported to MRI, switched to MRI Vent @ 0210. Pt transported back to 4N31 @ 9150 without complication.

## 2018-01-10 NOTE — Progress Notes (Signed)
I was called by pulmonary critical care in regards to possible craniotomy for aspiration of subdural fluid on this patient. I have had a chance to review his medical record as well as the two MRI with\without contrast of the brain, as well as his admission CT scan. I have also review the imaging findings with neuroradiology today. Briefly, it appears as though this patient initially presented with supratherapeutic INR, and CT scan demon strating bilateral subdural hematoma. He subsequently underwent an MRI scan again demonstrating bilateral subdural hematomas. The MRI scan done today also demonstrated the right sided subdural with some change in signal characteristics, but resolution of the left sided subdural. Of note, I do not see any leptomeningeal enhancement to suggest cerebritis or carcinomatous meningitis. There is some smooth dural thickening which I would expe ct to see with subdural hematoma that is now two weeks old. Also of note, there is minimal local mass effect, no midline shift, and no evidence of transtentorial or uncal herniation. I therefore do not think that there is significant amount of risk associated with lumbar puncture. In this patient, based on progression of imaging findings and clinical history, I think the subdural fluid is most likely blood. If there really is clinical c oncern for CNS infection or neoplastic process, I most certainly think that risk of LP is very low, and certainly much lower than the risk of craniotomy for evacuation of this fluid.   I have spoken to Dr. Debbora Dus regarding my opinion above.

## 2018-01-10 NOTE — Progress Notes (Signed)
Pharmacy Antibiotic Note  [name] is a 72 y.o. male admitted on 12/16/2017 with shortness of breath. Neurology has now noted proteinaceous fluid on MRI and has intiated treatment fos suspected meningitis. Pharmacy has been consulted for vancomycin dosing. WBC down to 16.8 and Tmax-100.2. Scr stable at 1.26.    Plan: Vancomycin 2 gm X 1  Then Vancomycin 750 mg every 12 hours Ceftriaxone 2 gm Q 12 hours  Ampicillin 2 gm Q 4 hours   Height: 5\' 9"  (175.3 cm) Weight: 252 lb 3.3 oz (114.4 kg) IBW/kg (Calculated) : 70.7  Temp (24hrs), Avg:99.8 F (37.7 C), Min:99.3 F (37.4 C), Max:100.3 F (37.9 C)  Recent Labs  Lab 01/03/18 1446 01/03/18 1851  01/05/18 1029 01/05/18 1652 01/06/18 0838 01/08/18 0421 01/09/18 0413 01/10/18 0515  WBC 13.5*  --   --   --   --  13.9*  --  21.1* 16.8*  CREATININE 1.41* 1.42*   < > 1.31* 1.27*  --  1.44* 1.30* 1.26*  LATICACIDVEN 1.9 1.8  --   --   --   --   --   --   --    < > = values in this interval not displayed.    Estimated Creatinine Clearance: 69 mL/min (A) (by C-G formula based on SCr of 1.26 mg/dL (H)).    No Known Allergies  Antimicrobials this admission: Vanc 3/29 >> 4/3; 4/9>>4/11; 4/13>> Zosyn 3/29 >> 4/5; 4/6>>4/11 Ceftriaxone 4/11 >> Ampicillin 4/13>>    Microbiology results: 3/29 BCx: 1/2 CoNS (no resistance) 4/1 Blood: neg 3/29 MRSA PCR: neg 4/6 BCx: ngtd 4/9 BCx: 1/2 CoNS 4/9 BCID: CoNS (no resistance) 4/9 TA: mod Kleb pneumo - R to amp only  Thank you for allowing pharmacy to be a part of this patient's care.  Jimmy Footman, PharmD, BCPS PGY2 Infectious Diseases Pharmacy Resident Pager: [phone]  01/10/2018 1:43 PM

## 2018-01-11 DIAGNOSIS — G0490 Encephalitis and encephalomyelitis, unspecified: Secondary | ICD-10-CM

## 2018-01-11 LAB — BASIC METABOLIC PANEL
ANION GAP: 7 (ref 5–15)
BUN: 82 mg/dL — AB (ref 6–20)
CO2: 20 mmol/L — AB (ref 22–32)
Calcium: 11.2 mg/dL — ABNORMAL HIGH (ref 8.9–10.3)
Chloride: 119 mmol/L — ABNORMAL HIGH (ref 101–111)
Creatinine, Ser: 1.34 mg/dL — ABNORMAL HIGH (ref 0.61–1.24)
GFR calc Af Amer: >60 mL/min (ref >60)
GFR, EST NON AFRICAN AMERICAN: 52 mL/min — AB (ref >60)
GLUCOSE: 301 mg/dL — AB (ref 65–99)
Potassium: 5.7 mmol/L — ABNORMAL HIGH (ref 3.5–5.1)
Sodium: 146 mmol/L — ABNORMAL HIGH (ref 135–145)

## 2018-01-11 LAB — CBC WITH DIFFERENTIAL/PLATELET
BASOS ABS: 0 10*3/uL (ref 0.0–0.1)
BASOS PCT: 0 %
Eosinophils Absolute: 0 10*3/uL (ref 0.0–0.7)
Eosinophils Relative: 0 %
HEMATOCRIT: 26.2 % — AB (ref 39.0–52.0)
Hemoglobin: 7.9 g/dL — ABNORMAL LOW (ref 13.0–17.0)
LYMPHS PCT: 7 %
Lymphs Abs: 0.7 10*3/uL (ref 0.7–4.0)
MCH: 26.3 pg (ref 26.0–34.0)
MCHC: 30.2 g/dL (ref 30.0–36.0)
MCV: 87.3 fL (ref 78.0–100.0)
MONO ABS: 0.2 10*3/uL (ref 0.1–1.0)
Monocytes Relative: 2 %
NEUTROS ABS: 9.8 10*3/uL — AB (ref 1.7–7.7)
Neutrophils Relative %: 91 %
PLATELETS: 250 10*3/uL (ref 150–400)
RBC: 3 MIL/uL — ABNORMAL LOW (ref 4.22–5.81)
RDW: 16.7 % — AB (ref 11.5–15.5)
WBC: 10.7 10*3/uL — ABNORMAL HIGH (ref 4.0–10.5)

## 2018-01-11 LAB — GLUCOSE, CAPILLARY
GLUCOSE-CAPILLARY: 158 mg/dL — AB (ref 65–99)
GLUCOSE-CAPILLARY: 258 mg/dL — AB (ref 65–99)
GLUCOSE-CAPILLARY: 311 mg/dL — AB (ref 65–99)
GLUCOSE-CAPILLARY: 226 mg/dL — AB (ref 65–99)
GLUCOSE-CAPILLARY: 223 mg/dL — AB (ref 65–99)
GLUCOSE-CAPILLARY: 166 mg/dL — AB (ref 65–99)
Glucose-Capillary: 160 mg/dL — ABNORMAL HIGH (ref 65–99)
Glucose-Capillary: 166 mg/dL — ABNORMAL HIGH (ref 65–99)
Glucose-Capillary: 184 mg/dL — ABNORMAL HIGH (ref 65–99)
Glucose-Capillary: 192 mg/dL — ABNORMAL HIGH (ref 65–99)
Glucose-Capillary: 209 mg/dL — ABNORMAL HIGH (ref 65–99)
Glucose-Capillary: 252 mg/dL — ABNORMAL HIGH (ref 65–99)
Glucose-Capillary: 265 mg/dL — ABNORMAL HIGH (ref 65–99)
Glucose-Capillary: 274 mg/dL — ABNORMAL HIGH (ref 65–99)
Glucose-Capillary: 281 mg/dL — ABNORMAL HIGH (ref 65–99)

## 2018-01-11 LAB — CULTURE, BLOOD (ROUTINE X 2)
Culture: NO GROWTH
SPECIAL REQUESTS: ADEQUATE

## 2018-01-11 LAB — VITAMIN B1: VITAMIN B1 (THIAMINE): 359.8 nmol/L — AB (ref 66.5–200.0)

## 2018-01-11 MED ORDER — INSULIN GLARGINE 100 UNIT/ML ~~LOC~~ SOLN
10.0000 [IU] | Freq: Once | SUBCUTANEOUS | Status: AC
  Administered 2018-01-11: 10 [IU] via SUBCUTANEOUS
  Filled 2018-01-11: qty 0.1

## 2018-01-11 MED ORDER — PANTOPRAZOLE SODIUM 40 MG PO PACK
40.0000 mg | PACK | Freq: Every day | ORAL | Status: DC
  Administered 2018-01-11 – 2018-01-17 (×7): 40 mg
  Filled 2018-01-11 (×7): qty 20

## 2018-01-11 MED ORDER — SODIUM CHLORIDE 0.9 % IV SOLN
INTRAVENOUS | Status: DC
  Administered 2018-01-11: 2.1 [IU]/h via INTRAVENOUS
  Filled 2018-01-11: qty 1

## 2018-01-11 MED ORDER — CHLORHEXIDINE GLUCONATE 0.12 % MT SOLN
OROMUCOSAL | Status: AC
  Filled 2018-01-11: qty 15

## 2018-01-11 MED ORDER — INSULIN ASPART 100 UNIT/ML ~~LOC~~ SOLN
0.0000 [IU] | SUBCUTANEOUS | Status: DC
  Administered 2018-01-11: 11 [IU] via SUBCUTANEOUS
  Administered 2018-01-11: 15 [IU] via SUBCUTANEOUS
  Administered 2018-01-11: 7 [IU] via SUBCUTANEOUS

## 2018-01-11 NOTE — Progress Notes (Addendum)
Subjective: Patient currently sedated with Precedex and fentanyl and currently intubated, he easily withdraws to stimulus, but currently not following commands.  Per nurse there are no episodes of tonic-clonic movements overnight but every time sedation is minimally weaned patient becomes very agitated and aggressive.  On Friday 4/12 additional history was obtained from family by Dr. Cheral Marker: He has had gradually increasing mood lability as well as paranoia and persecutory delusions since about one year ago. In mid March he precipitously declined, with intermittent dizziness, an episode where he fell striking his head and intermittent episodes of dysarthric speech as well as acutely worsened swallowing function. Of note, his dysarthric speech improved with steroid treatment during this admission, then worsened after steroids were held.    Objective: Current vital signs: BP 125/69   Pulse 77   Temp 99.2 F (37.3 C) (Axillary)   Resp 17   Ht '5\' 9"'$  (1.753 m)   Wt 119.6 kg (263 lb 10.7 oz)   SpO2 100%   BMI 38.94 kg/m  Vital signs in last 24 hours: Temp:  [98.6 F (37 C)-100.9 F (38.3 C)] 99.2 F (37.3 C) (04/14 0800) Pulse Rate:  [54-108] 77 (04/14 0848) Resp:  [11-20] 17 (04/14 0848) BP: (89-151)/(38-84) 125/69 (04/14 0848) SpO2:  [98 %-100 %] 100 % (04/14 0800) FiO2 (%):  [40 %] 40 % (04/14 0848) Weight:  [119.6 kg (263 lb 10.7 oz)] 119.6 kg (263 lb 10.7 oz) (04/14 0600)  Intake/Output from previous day: 04/13 0701 - 04/14 0700 In: 1886.7 [I.V.:711.7; NG/GT:725; IV Piggyback:450] Out: 2350 [Urine:2100; Stool:250] Intake/Output this shift: Total I/O In: 175.7 [I.V.:45.7; NG/GT:30; IV Piggyback:100] Out: -  Nutritional status: Diet NPO time specified  Physical Exam  HEENT-  Normocephalic, no lesions, without obvious abnormality.  Normal external eye and conjunctiva.  Patient currently intubated Cardiovascular- S1-S2 audible, pulses palpable throughout   Lungs-ventilation  breath sounds patient currently on vent-.  Saturations within normal limits Abdomen- All 4 quadrants palpated and nontender Musculoskeletal-patient moves extremities without difficulty, no edema noted Skin-warm and dry,   Neuro: Mental Status: Patient currently sedated with propofol and intubated.  Currently not able to follow commands but does withdraw to pain and noxious.  Cranial Nerves: II: Unable to accurately assess as patient is sedated III,IV, VI: Unable to currently assess EOM, pupils are sluggish but reactive to light and does not blink to threat.   V,VII: Unable to accurately assess but no facial droop noted  VIII: Able to accurately assess IX,X: Patient currently intubated  XI: Unable to accurately assess XII: Unable to accurately assess Motor/Sensory:  Only moves bilateral lower extremities and withdraws  to noxious stimuli and sternal rub Tone and bulk:normal tone throughout; no atrophy noted,  Deep Tendon Reflexes: Muted in bilateral lower extremities  plantars: Right: downgoing                                Left: downgoing Cerebellar: Unable to accurately assess patient is intubated and sedated  Gait: Not assessed  klm  Recent Labs    01/09/18 1440  TRIG 305*    Medications:  Scheduled: . chlorhexidine gluconate (MEDLINE KIT)  15 mL Mouth Rinse BID  . Chlorhexidine Gluconate Cloth  6 each Topical Daily  . diltiazem  60 mg Oral Q8H  . feeding supplement (PRO-STAT SUGAR FREE 64)  60 mL Per Tube 5 X Daily  . insulin aspart  0-20 Units Subcutaneous Q4H  .  insulin glargine  15 Units Subcutaneous QHS  . ipratropium-albuterol  3 mL Nebulization TID  . mouth rinse  15 mL Mouth Rinse 10 times per day  . methimazole  20 mg Per Tube Q6H  . methylPREDNISolone (SOLU-MEDROL) injection  40 mg Intravenous Q12H  . multivitamin  15 mL Per Tube Daily  . pantoprazole (PROTONIX) IV  40 mg Intravenous Q24H  . potassium chloride  40 mEq Per Tube BID  . sodium chloride  flush  10-40 mL Intracatheter Q12H   Continuous: . ampicillin (OMNIPEN) IV Stopped (01/11/18 5462)  . cefTRIAXone (ROCEPHIN)  IV 2 g (01/11/18 0940)  . dexmedetomidine (PRECEDEX) IV infusion 0.5 mcg/kg/hr (01/11/18 0900)  . diltiazem (CARDIZEM) infusion Stopped (01/10/18 1755)  . feeding supplement (VITAL HIGH PROTEIN) 15 mL/hr at 01/11/18 0900  . fentaNYL infusion INTRAVENOUS 50 mcg/hr (01/11/18 0900)  . insulin (NOVOLIN-R) infusion Stopped (01/11/18 0047)  . lactated ringers Stopped (01/07/18 1712)  . levETIRAcetam Stopped (01/11/18 0600)  . propofol (DIPRIVAN) infusion 19.928 mcg/kg/min (01/10/18 0700)  . vancomycin Stopped (01/11/18 0322)     Studies/Results: Mr Virgel Paling Wo Contrast  01/10/2018 IMPRESSION: 1. Similar acute to subacute RIGHT holohemispheric subdural hematoma. Trace residual falcotentorial subdural hematoma. Near complete resolution of LEFT subdural hematoma.  2. Moderate parenchymal brain volume loss and mild chronic small vessel ischemic disease.  3. Negative noncontrast MRA head.   Assessment  Encephalopathy in a 72 yr old male with right subdural fluid collection in conjunction with subarachnoid fluid on repeat MRI with mild to moderate mass effect.   1. Repeat MRI reveals similar acute to subacute RIGHT holohemispheric subdural hematoma. Trace residual falcotentorial subdural hematoma. There is near complete resolution of LEFT subdural hematoma. Of note, the Radiology report does not describe the proteinaceous-fluid-like signal characteristics seen extensively in the sulci on the FLAIR images (see below). Also of note, the MRI is negative for a rhombencephalitis or cerebellitis - the findings at these locations resulting in suspicion for these entities on the initial MRI brain are revealed by MRI #2 to have been artifactual.  2. MRA head: Negative noncontrast MRA head. 3. The subdural on MRI and CT is loculated. Given his lack of improvement, suspect that it may be  infected.  Neurosurgery was consulted for craniotomy for aspiration of subdural fluid -evaluation of the imaging, they did not see any leptomeningeal enhancement to suggest cerebritis or carcinomatous meningitis.  With minimal local mass-effect, no midline shift and no evidence of transtentorial or uncal herniation, neurosurgery recommended LP-which is very low risk as compared to craniotomy for evacuation of the subdural fluid. 4. Also noted on repeat MRI is extensive subarachnoid increased signal on FLAIR, suggestive of proteinaceous fluid. As there was no corresponding hyperdensity within the sulci on the recent CT, the fluid may represent pus/infection, or a malignant infiltrate such as from solid tumor metastasis (carcinomatous meningitis) or lymphoma (lymphomatous meningitis). Of note, if the subarachnoid proteinaceous fluid signal seen on MRI represents malignancy or infection, it would explain why the patient's mentation is disproportionately worse than expected given the initial CT findings.   5. Patient has not had any visible seizures. Initial EEG did not show evidence of ongoing seizure.Follow up EEGalso reveals no seizure but is clearly abnormal and c/w a diffuse encephalopathy.  6. A. fib with RVR  7. Hyperkalemia 8. Review of clinical course: History of a fib, stage 3 chronic kidney disease, DM2, adenocarcinoma of colon s/p resection and current colostomy, history of renal cell carcinoma s/o resection  who was admitted on 12/14/2017 for shortness of breath, supratherputic INR, afib with RVR and pulm edema. During his stay he was diagnosed with thyrotoxicosis, bilateral SDH and seizure. On 4/2 PCCM consulted for increased work of breathing, hypoxia, AMS. He was intubated and tx to ICU. He was transferred out of the ICU on 01/02/2018 and sent back to the ICU on 01/03/2018 when he became acutely dyspneic with tachypnea. Patient was started on BiPAP and became comfortable, more awake and became more  interactive. Had to be reintubated again on 01/06/2018.   Recommendations: 1. Continue empiric meningitis-dose antibiotics Ceftriaxone, Vancomycin and Ampicillin  2. Labs to be obtained on CSF from fluoro guided LP on Monday should include bacterial/fungal culture, flow cytometry, microscopic evaluation and staining by pathology (cytology), as well as AFB, gram stain, fungal stain, special stains for possible lymphomatous or carcinomatous meningitis.  3. If drained subdural fluid is negative for infection, consider empiric trial of high dose IV steroids, by switching current solumedrol dosing of 160 mg total per day to 1000 mg IV qd x 5 days, followed by a taper. Significant space occupied by the right subdural hematomas on MRI, therefore LP risks outweigh benefits due to risk of herniation.  4. Anti Jo-1 antibody is pending 5. Continue Keppra for seizure prophylaxis. 6. DC Potassium     LOS: 16 days   '@Electronically'$  signed Letha Cape DNP 01/11/2018  10:12 AM  35 minutes spent in the neurological evaluation and management of this critically ill patient.   Electronically signed: Dr. Kerney Elbe

## 2018-01-11 NOTE — Progress Notes (Signed)
PULMONARY / CRITICAL CARE MEDICINE   Name: [name] MRN: [id number] DOB: 1946-04-06    ADMISSION DATE:  12/08/2017 CONSULTATION DATE: 12/30/2017  REFERRING MD:  Karie Kirks  CHIEF COMPLAINT:  Shortness of breath   HISTORY OF PRESENT ILLNESS:   [name] is a 72 yr old male with history of a fib, stage 3 chronic kidney disease, DM2, adenocarcinoma of colon s/p resection and current colostomy, history of renal cell carcinoma s/o resection who was admitted on 12/04/2017 for shortnes s of breath, supratherputic INR,  afib with RVR and pulm edema. During his stay he was diagnosed with thyrotoxicosis and bilateral SDH and seizure. On 4/2 PCCM consulted for increased work of breathing, hypoxia, AMS.  He was intubated and tx to ICU.      Recent Events Extubated.  Transferred out of ICU on 4/5 Called back on 4/6 as he became acutely dyspneic with tachypnea Chest x-ray is better than before and ABG shows mild alkalosis  4/7 The patient is awake and appears relatively comfortable. He remains on BIPAP. His CXR was fairly clear. ABG shows a pH of 7.44/ PacO2 45/Pa02  132  4/8 Patient opens eyes but noit trying to speak this AM. He is on  ansal cannula and maintaining his 02 sats.  4/9 The patient remains obtunded. He is a bit tachypneic as well.  He has a low grade temp of about 100. His wife is fairly concerned aboiut the patient. His last CT scan of 4/6 actually appeared improved and his TFTs appeared recently to be trending in the right direction.  4/10 The patient became progressively obtunded yesterday. We intubated him in the early afternoon. I asked neurolgy to weigh in. It appears to be atoxic-metabolic issue. The patient amy have a respiratory infx. There were thoick green-yellow secretions on suctionoing and visualized in the beck of the pharynx on intubation. The patient had been running a low grade temp as well.  4/11 The patient is sedated on the ventilator. Currently getting  an EEG. He is hemodynamically stable. His heart rate is still 110-120.  I am going to restart po Cardizem. We had to place thepatient on an insulin drip as his BS were consistently in the high 200-300s.  4/12 The patient remains on the ventialtor. The family has told us that prior to admission thepatient was having personality changes (paranoia)  and dysarthria. Dr. Cheral Marker reviewed the first MRI and he bleieve there may be inflammatory changes in the cerebellum. We are going to get a new MRI with contrast today. If for some reason it is not diagnopstic we may need to perform an LP. There were not telltale signs of herpeetic encephalitis on the first MRI howver.   4/14 The patient remains on the ventilator. He is sedated. It appears that he has been weaned off the Cardizem drip. Wec are trying to keep him off the insulin drip as well. After conversations with neuro and neurosurgery. We a trying to get an LP done under fluoro for dignostic purposes. The patient still remains somewhat obtunded evewn when hsi sedation has been turned way down. At the request of neuro I placed him on an emoiric regimern for meningitis as well with naco, Ampicillin and high dose Rocephin    VITAL SIGNS: BP 130/66   Pulse 76   Temp 99.2 F (37.3 C) (Axillary)   Resp 11   Ht 5\' 9"  (1.753 m)   Wt 263 lb 10.7 oz (119.6 kg)   SpO2 100%  BMI 38.94 kg/m        INTAKE / OUTPUT: I/O last 3 completed shifts: In: 4481.3 [I.V.:2576.3; NG/GT:1355; IV Piggyback:550] Out: 8315 [Urine:3150; Stool:600]  PHYSICAL EXAMINATION: Gen:      No acute distress. [name] on the ventialtor HEENT:  EOMI, sclera anicteric. NGT in left nare Neck:     No masses; no thyromegaly Lungs:    Clear to auscultation bilaterally; normal respiratory effort CV:         Regular rate and rhythm; no murmurs Abd:      Obese,+ bowel sounds; soft, non-tender; no palpable masses, no distension Ext:    No edema; adequate peripheral  perfusion Skin:      Warm and dry; no rash Neuro:  Will open eyes when coaxed. Moved RUE but not the left. woiill reassess when he is more awake.  LABS:  BMET Recent Labs  Lab 01/09/18 0413 01/10/18 0515 01/11/18 0500  NA 142 137 146*  K 4.4 4.9 5.7*  CL 114* 112* 119*  CO2 21* 18* 20*  BUN 45* 65* 82*  CREATININE 1.30* 1.26* 1.34*  GLUCOSE 166* 541* 301*    Electrolytes Recent Labs  Lab 01/09/18 0413 01/10/18 0515 01/11/18 0500  CALCIUM 10.0 10.0 11.2*  MG 1.9  --   --   PHOS 2.9  --   --     CBC Recent Labs  Lab 01/09/18 0413 01/10/18 0515 01/11/18 0500  WBC 21.1* 16.8* 10.7*  HGB 8.8* 7.6* 7.9*  HCT 28.6* 25.2* 26.2*  PLT 266 292 250    Coag's No results for input(s): APTT, INR in the last 168 hours.  Sepsis Markers Recent Labs  Lab 01/06/18 0838 01/07/18 0434 01/08/18 0421  PROCALCITON <0.10 <0.10 <0.10    ABG Recent Labs  Lab 01/06/18 1656 01/09/18 0353 01/10/18 0415  PHART 7.469* 7.363 7.299*  PCO2ART 37.6 39.6 44.2  PO2ART 456.0* 137* 116*    Liver Enzymes Recent Labs  Lab 01/08/18 1110  AST 19  ALT 72*  ALKPHOS 75  BILITOT 0.9  ALBUMIN 2.0*    Cardiac Enzymes Recent Labs  Lab 01/09/18 0413  TROPONINI 0.13*    Glucose Recent Labs  Lab 01/10/18 2154 01/10/18 2258 01/11/18 0016 01/11/18 0420 01/11/18 0750 01/11/18 1011  GLUCAP 146* 158* 184* 258* 311* 265*    Imaging No results found. STUDIES:  CT head 12/28/2017 >>1. Bilateral subdural hematomas, right larger than left. Focal collection of blood in the right frontoparietal region with hematocrit level, ongoing active bleeding not excluded. Short interval follow-up recommended. 2. Minimal mass effect without significant midline shift. MRI brian 4/1>>> 1. Stable right greater than left subdural hematomas since the head CT yesterday.  The right side subdural demonstrates greater lobulation and signal heterogeneity measuring between 8 and 17 mm in thickness.  The smaller more homogeneous left side subdural measures 6-7 mm in thickness. 2. Stable mild intracranial mass effect with trace leftward midline shift. Normal basilar cisterns. Trace intraventricular hemorrhage with no ventriculomegaly. 3. No superimposed acute infarct. CT head 4/2>>> 1. Stable right larger than left mixed attenuation subdural hematomas. Stable minimal associated mass effect. No herniation. 2. No new acute intracranial abnormality identified. US thyroid 4/4>>> 1.5 cm right mid thyroid TR 4 nodule meets criteria for biopsy as Above.  1.4 cm left inferior TR 3 nodule does not meet criteria for any biopsy or follow-up.   Nonspecific gland heterogeneity.  CULTURES: BC x 2 3/29>>> 1/2 coag neg staph>> suspect contaminant  BC x 2  4/1>>> neg   LINES/TUBES:  ETT 4/2>> 4/4 RUA PICC 3/30>>>  ANTIBIOTICS: Zosyn 3/30>> 4/5, restart 4/6 Vanc 3/31>>>4/3  SIGNIFICANT EVENTS: 12/30/2017 transfer to ICU for worsening resp failure and mental status with afib RVR  01/01/2018>> Extubated  4/5 >> Transfer back to ICU with similar presentation 4/9:: Reintubation for worsening obtundation  ASSESSMENT / PLAN: Altered Mental Status The patient is sedated on the ventilator once again  he will move his head and briefly open hius eyes when provoked.  Repeat CT head did not show a significant change in the size or extent of the subdural hematomas.  Patient getting EEG presently. Will reassesss patient after stopping the propofol. The patient's course has been puzzling. Dr Cheral Marker suspects that thepatient amy in fact have a paraneoplastic synfdrome involving the cerebellum   Pulm/Infx I bleieve the patient has a resp., infx based on my above statements despite pretty unremrakable CXR. I don't believe the patient had a PE. His 02 needs are fairly minimal. I would be concerned about performing CTA with his kidney dysfn. Resp culture pending The patient grew out coag neg staph in the  blood. It likely represents a contaminant. He did grow out Klebsiella On in his sputum, sensitivities pending/. We hav stopped Vanco and deecalated treatment for the Klebsiella.  ?CNS INFX Again the patient is on meningitis regoimen presently peending cultres. We have requested IR to dom an LP under fluoro guidance given his size. Neurosurgery was not keeon a cklosed drainage procedure of his head.      CARDIOVASCULAR  BP is stable. the patient is in a NSR.  HTN emergency. His troponin was 0.21 on 4/7 and had peaked on 4/6 aat 0.37 and I am not overly concerned. He is in Atrial fib with heart rate 110-120  2D echo 3//30>> EF -65%, mod hypertrophy, trivial Dwayne, trivial TR, severely dilated RA and LA  The patioent developed rapid Atrial fib yesterday7 and ended up back on  A Cardizem drip. Last ECHO though limited in scope dod not show any evidence of valvualr vegetations. Patien remians innAtrial fib with a controlled response. His heart rate is better controlled and it appears he is finsally off IV Cardizem.  Endocrine The patient had a brief episode of thyrotoxicosis. His free T4 is going down. TSH still remains undetectable.   SSI  I have increased lantus from 6-10 units. Blood sugar are elevated. I am going to stop steroids. We are bumping up Lantus. if this is unsatisfactory her may need an insulin drip. Hav started Lantus. Changed insulin scale form sensitve to resistant scale.  Hypernatremia  Resolved  Summary:: In short we have a patient who presented originally with SDH. His hospital course was complicated by thyrotoxicosis. He developed worsening mental status in the past few days. I feel he has a respiratory infx. I am hopeful that once we have taken off sedation patient will be more repsonsive at this point. If not we may need to repeat his MRI and I will discuss further with neurology.

## 2018-01-12 ENCOUNTER — Encounter (HOSPITAL_COMMUNITY): Payer: Medicare Other

## 2018-01-12 ENCOUNTER — Inpatient Hospital Stay (HOSPITAL_COMMUNITY): Payer: Medicare Other

## 2018-01-12 ENCOUNTER — Other Ambulatory Visit: Payer: Self-pay | Admitting: Diagnostic Radiology

## 2018-01-12 DIAGNOSIS — G92 Toxic encephalopathy: Secondary | ICD-10-CM

## 2018-01-12 LAB — CBC WITH DIFFERENTIAL/PLATELET
BASOS ABS: 0 10*3/uL (ref 0.0–0.1)
BLASTS: 0 %
Band Neutrophils: 1 %
Basophils Relative: 0 %
EOS PCT: 0 %
Eosinophils Absolute: 0 10*3/uL (ref 0.0–0.7)
HEMATOCRIT: 27.4 % — AB (ref 39.0–52.0)
HEMOGLOBIN: 8.3 g/dL — AB (ref 13.0–17.0)
LYMPHS ABS: 0.1 10*3/uL — AB (ref 0.7–4.0)
Lymphocytes Relative: 1 %
MCH: 26.8 pg (ref 26.0–34.0)
MCHC: 30.3 g/dL (ref 30.0–36.0)
MCV: 88.4 fL (ref 78.0–100.0)
METAMYELOCYTES PCT: 0 %
MYELOCYTES: 0 %
Monocytes Absolute: 0.1 10*3/uL (ref 0.1–1.0)
Monocytes Relative: 1 %
NEUTROS PCT: 97 %
NRBC: 0 /100{WBCs}
Neutro Abs: 9.1 10*3/uL — ABNORMAL HIGH (ref 1.7–7.7)
Other: 0 %
PLATELETS: 251 10*3/uL (ref 150–400)
PROMYELOCYTES RELATIVE: 0 %
RBC: 3.1 MIL/uL — ABNORMAL LOW (ref 4.22–5.81)
RDW: 17.2 % — ABNORMAL HIGH (ref 11.5–15.5)
WBC: 9.3 10*3/uL (ref 4.0–10.5)

## 2018-01-12 LAB — CSF CELL COUNT WITH DIFFERENTIAL
RBC Count, CSF: 1 /mm3 — ABNORMAL HIGH
Tube #: 4
WBC, CSF: 2 /mm3 (ref 0–5)

## 2018-01-12 LAB — BASIC METABOLIC PANEL
Anion gap: 7 (ref 5–15)
BUN: 82 mg/dL — AB (ref 6–20)
CO2: 23 mmol/L (ref 22–32)
CREATININE: 1.2 mg/dL (ref 0.61–1.24)
Calcium: 11.2 mg/dL — ABNORMAL HIGH (ref 8.9–10.3)
Chloride: 121 mmol/L — ABNORMAL HIGH (ref 101–111)
GFR, EST NON AFRICAN AMERICAN: 60 mL/min — AB (ref >60)
Glucose, Bld: 175 mg/dL — ABNORMAL HIGH (ref 65–99)
Potassium: 5.2 mmol/L — ABNORMAL HIGH (ref 3.5–5.1)
SODIUM: 151 mmol/L — AB (ref 135–145)

## 2018-01-12 LAB — GLUCOSE, CAPILLARY
GLUCOSE-CAPILLARY: 136 mg/dL — AB (ref 65–99)
GLUCOSE-CAPILLARY: 163 mg/dL — AB (ref 65–99)
GLUCOSE-CAPILLARY: 182 mg/dL — AB (ref 65–99)
GLUCOSE-CAPILLARY: 202 mg/dL — AB (ref 65–99)
GLUCOSE-CAPILLARY: 207 mg/dL — AB (ref 65–99)
Glucose-Capillary: 130 mg/dL — ABNORMAL HIGH (ref 65–99)
Glucose-Capillary: 145 mg/dL — ABNORMAL HIGH (ref 65–99)
Glucose-Capillary: 169 mg/dL — ABNORMAL HIGH (ref 65–99)
Glucose-Capillary: 220 mg/dL — ABNORMAL HIGH (ref 65–99)

## 2018-01-12 LAB — GLUCOSE, CSF: GLUCOSE CSF: 117 mg/dL — AB (ref 40–70)

## 2018-01-12 LAB — PROTEIN, CSF: Total  Protein, CSF: 74 mg/dL — ABNORMAL HIGH (ref 15–45)

## 2018-01-12 LAB — TRIGLYCERIDES: Triglycerides: 124 mg/dL (ref <150)

## 2018-01-12 MED ORDER — LIDOCAINE HCL (PF) 1 % IJ SOLN
5.0000 mL | Freq: Once | INTRAMUSCULAR | Status: AC
  Administered 2018-01-12: 5 mL via INTRADERMAL

## 2018-01-12 MED ORDER — DEXMEDETOMIDINE HCL IN NACL 400 MCG/100ML IV SOLN
0.4000 ug/kg/h | INTRAVENOUS | Status: DC
  Administered 2018-01-12: 0.8 ug/kg/h via INTRAVENOUS
  Administered 2018-01-12: 0.4 ug/kg/h via INTRAVENOUS
  Administered 2018-01-12 (×2): 0.6 ug/kg/h via INTRAVENOUS
  Administered 2018-01-12: 0.8 ug/kg/h via INTRAVENOUS
  Administered 2018-01-13: 0.6 ug/kg/h via INTRAVENOUS
  Administered 2018-01-13 – 2018-01-14 (×3): 0.7 ug/kg/h via INTRAVENOUS
  Administered 2018-01-14: 0.6 ug/kg/h via INTRAVENOUS
  Administered 2018-01-14: 0.7 ug/kg/h via INTRAVENOUS
  Administered 2018-01-14 (×2): 0.6 ug/kg/h via INTRAVENOUS
  Administered 2018-01-15 (×3): 1 ug/kg/h via INTRAVENOUS
  Administered 2018-01-15: 0.6 ug/kg/h via INTRAVENOUS
  Administered 2018-01-16: 1.2 ug/kg/h via INTRAVENOUS
  Administered 2018-01-16: 1 ug/kg/h via INTRAVENOUS
  Administered 2018-01-16 (×2): 1.2 ug/kg/h via INTRAVENOUS
  Administered 2018-01-16 (×3): 1 ug/kg/h via INTRAVENOUS
  Administered 2018-01-17: 1.2 ug/kg/h via INTRAVENOUS
  Administered 2018-01-17: 0.6 ug/kg/h via INTRAVENOUS
  Administered 2018-01-17: 1.2 ug/kg/h via INTRAVENOUS
  Administered 2018-01-17: 1 ug/kg/h via INTRAVENOUS
  Administered 2018-01-17: 1.2 ug/kg/h via INTRAVENOUS
  Administered 2018-01-17: 0.4 ug/kg/h via INTRAVENOUS
  Administered 2018-01-18 (×2): 0.6 ug/kg/h via INTRAVENOUS
  Filled 2018-01-12 (×35): qty 100

## 2018-01-12 MED ORDER — INSULIN DETEMIR 100 UNIT/ML ~~LOC~~ SOLN
12.0000 [IU] | Freq: Two times a day (BID) | SUBCUTANEOUS | Status: DC
  Administered 2018-01-12 – 2018-01-15 (×8): 12 [IU] via SUBCUTANEOUS
  Filled 2018-01-12 (×8): qty 0.12

## 2018-01-12 MED ORDER — INSULIN ASPART 100 UNIT/ML ~~LOC~~ SOLN
3.0000 [IU] | SUBCUTANEOUS | Status: DC
  Administered 2018-01-12: 9 [IU] via SUBCUTANEOUS
  Administered 2018-01-12: 6 [IU] via SUBCUTANEOUS
  Administered 2018-01-12 (×2): 9 [IU] via SUBCUTANEOUS
  Administered 2018-01-13: 3 [IU] via SUBCUTANEOUS
  Administered 2018-01-13 (×3): 6 [IU] via SUBCUTANEOUS
  Administered 2018-01-13: 9 [IU] via SUBCUTANEOUS
  Administered 2018-01-13 – 2018-01-15 (×8): 6 [IU] via SUBCUTANEOUS
  Administered 2018-01-15: 9 [IU] via SUBCUTANEOUS
  Administered 2018-01-15: 6 [IU] via SUBCUTANEOUS
  Administered 2018-01-15 – 2018-01-16 (×4): 9 [IU] via SUBCUTANEOUS
  Administered 2018-01-16: 3 [IU] via SUBCUTANEOUS
  Administered 2018-01-16 (×2): 9 [IU] via SUBCUTANEOUS
  Administered 2018-01-16 – 2018-01-17 (×3): 6 [IU] via SUBCUTANEOUS
  Administered 2018-01-17: 9 [IU] via SUBCUTANEOUS
  Administered 2018-01-17: 6 [IU] via SUBCUTANEOUS
  Administered 2018-01-17: 9 [IU] via SUBCUTANEOUS
  Administered 2018-01-17: 6 [IU] via SUBCUTANEOUS
  Administered 2018-01-18: 9 [IU] via SUBCUTANEOUS
  Administered 2018-01-18: 6 [IU] via SUBCUTANEOUS
  Administered 2018-01-18: 9 [IU] via SUBCUTANEOUS
  Administered 2018-01-18: 6 [IU] via SUBCUTANEOUS

## 2018-01-12 MED ORDER — DEXTROSE 10 % IV SOLN: INTRAVENOUS | Status: DC | PRN

## 2018-01-12 MED ORDER — LIDOCAINE HCL (PF) 1 % IJ SOLN
INTRAMUSCULAR | Status: AC
  Filled 2018-01-12: qty 5

## 2018-01-12 MED ORDER — MIDAZOLAM HCL 2 MG/2ML IJ SOLN
2.0000 mg | Freq: Once | INTRAMUSCULAR | Status: AC
  Administered 2018-01-12: 2 mg via INTRAVENOUS

## 2018-01-12 MED ORDER — MIDAZOLAM HCL 2 MG/2ML IJ SOLN
INTRAMUSCULAR | Status: AC
Start: 2018-01-12 — End: 2018-01-12
  Filled 2018-01-12: qty 2

## 2018-01-12 MED ORDER — INSULIN ASPART 100 UNIT/ML ~~LOC~~ SOLN
4.0000 [IU] | SUBCUTANEOUS | Status: DC
  Administered 2018-01-12 – 2018-01-18 (×37): 4 [IU] via SUBCUTANEOUS

## 2018-01-12 NOTE — Progress Notes (Signed)
PULMONARY / CRITICAL CARE MEDICINE   Name: [name] MRN: [id number] DOB: [birth year]    ADMISSION DATE:  12/13/2017   HISTORY OF PRESENT ILLNESS:        This is a 72 year-old with a history of type 2 diabetes, chronic atrial fibrillation, and chronic kidney disease who was initially admitted on 3/29 complaining of dyspnea on exertion.  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  He has had a decline in his mental status and intermittent seizure activity and has required intubation and mechanical ventilation.  CT scan of the head has shown a right subdural hematoma without mass-effect.  An MRI which was obtained yesterday suggested some enhancement of the meninges therefore an LP was performed on 4/15.  There are no white cells in that fluid and the protein is 74.  Remains intubated mechanically ventilated.  He is pharmacologically sedated and not interactive with me.  He has had no witnessed seizure activity  PAST MEDICAL HISTORY :  He  has a past medical history of Atrial fibrillation ([place]), Cancer ([place]), Chronic anticoagulation, Diabetes mellitus without complication ([place]), and Hypertension.  PAST SURGICAL HISTORY: He  has a past surgical history that includes Colostomy.  No Known Allergies  No current facility-administered medications on file prior to encounter.    Current Outpatient Medications on File Prior to Encounter  Medication Sig  . ANORO ELLIPTA 62.5-25 MCG/INH AEPB TAKE 1 PUFF BY MOUTH EVERY DAY  . CALCIUM PO Take 200 mg by mouth daily.   . Cholecalciferol 1000 units tablet Take 1,000 Units by mouth daily.  Marland Kitchen dicyclomine (BENTYL) 20 MG tablet Take 20 mg by mouth 3 (three) times daily as needed for spasms.   Marland Kitchen diltiazem (CARDIZEM) 60 MG tablet Take 60 mg by mouth 4 (four) times daily.   . DULoxetine (CYMBALTA) 20 MG capsule Take 20 mg by mouth daily.  Marland Kitchen gabapentin (NEURONTIN) 300 MG capsule  Take 300 mg by mouth daily.   Marland Kitchen HYDROcodone-acetaminophen (NORCO) 10-325 MG tablet Take 1 tablet by mouth every 6 (six) hours as needed for moderate pain.   . IRON PO Take 55 mg by mouth daily.   Marland Kitchen LYRICA 100 MG capsule Take 100 mg by mouth 3 (three) times daily.   . metFORMIN (GLUCOPHAGE) 1000 MG tablet Take 1,000 mg by mouth 2 (two) times daily.   . Omega-3 Fatty Acids (FISH OIL PO) Take 1,000 mg by mouth daily.   . pioglitazone (ACTOS) 45 MG tablet Take 45 mg by mouth daily.   . Probiotic Product (PROBIOTIC PO) Take 1 tablet by mouth daily.   . simvastatin (ZOCOR) 80 MG tablet Take 40 mg by mouth daily.   Marland Kitchen warfarin (COUMADIN) 5 MG tablet Take 5-10 tablets by mouth as directed. 10 mg on M W TH FRI 5 mg TUES SAT    FAMILY HISTORY:  His indicated that the status of his mother is unknown. He indicated that the status of his father is unknown.   SOCIAL HISTORY: He  reports that he has quit smoking. He has never used smokeless tobacco. He reports that he drinks alcohol. He reports that he does not use drugs.  REVIEW OF SYSTEMS:   Not obtainable  SUBJECTIVE:  Unobtainable  VITAL SIGNS: BP 132/78   Pulse 98   Temp 99.4 F (37.4 C) (Axillary)   Resp 13   Ht 5\' 9"  (1.753 m)   Wt 268 lb 8.3  oz (121.8 kg)   SpO2 100%   BMI 39.65 kg/m   HEMODYNAMICS:    VENTILATOR SETTINGS: Vent Mode: PRVC FiO2 (%):  [40 %] 40 % Set Rate:  [14 bmp] 14 bmp Vt Set:  [570 mL] 570 mL PEEP:  [5 cmH20] 5 cmH20 Pressure Support:  [10 cmH20] 10 cmH20 Plateau Pressure:  [12 cmH20-14 cmH20] 14 cmH20  INTAKE / OUTPUT: I/O last 3 completed shifts: In: 3506.8 [I.V.:946.8; NG/GT:1010; IV Piggyback:1550] Out: 3875 [Urine:4900; Stool:420]  PHYSICAL EXAMINATION: General: This is an obese male who is orally intubated mechanically ventilated and in no distress. Neuro: He is not responsive to voice but is on both fentanyl and Precedex at the time of my exam.  Pupils are equal and he is spontaneously  moving both upper extremities Cardiovascular: S1 and S2 are regular regular without murmur or gallop Lungs: Respirations are unlabored he is breathing above the set ventilator rate.  There is symmetric air movement, no wheezes. Abdomen: Abdomen is obese and soft without any organomegaly masses tenderness guarding or rebound .  There is a functioning right lower quadrant ostomy musculoskeletal: Does not have edema   LABS:  BMET Recent Labs  Lab 01/10/18 0515 01/11/18 0500 01/12/18 0356  NA 137 146* 151*  K 4.9 5.7* 5.2*  CL 112* 119* 121*  CO2 18* 20* 23  BUN 65* 82* 82*  CREATININE 1.26* 1.34* 1.20  GLUCOSE 541* 301* 175*    Electrolytes Recent Labs  Lab 01/09/18 0413 01/10/18 0515 01/11/18 0500 01/12/18 0356  CALCIUM 10.0 10.0 11.2* 11.2*  MG 1.9  --   --   --   PHOS 2.9  --   --   --     CBC Recent Labs  Lab 01/10/18 0515 01/11/18 0500 01/12/18 0356  WBC 16.8* 10.7* 9.3  HGB 7.6* 7.9* 8.3*  HCT 25.2* 26.2* 27.4*  PLT 292 250 251    Coag's No results for input(s): APTT, INR in the last 168 hours.  Sepsis Markers Recent Labs  Lab 01/06/18 0838 01/07/18 0434 01/08/18 0421  PROCALCITON <0.10 <0.10 <0.10    ABG Recent Labs  Lab 01/06/18 1656 01/09/18 0353 01/10/18 0415  PHART 7.469* 7.363 7.299*  PCO2ART 37.6 39.6 44.2  PO2ART 456.0* 137* 116*    Liver Enzymes Recent Labs  Lab 01/08/18 1110  AST 19  ALT 72*  ALKPHOS 75  BILITOT 0.9  ALBUMIN 2.0*    Cardiac Enzymes Recent Labs  Lab 01/09/18 0413  TROPONINI 0.13*    Glucose Recent Labs  Lab 01/12/18 0153 01/12/18 0259 01/12/18 0414 01/12/18 0526 01/12/18 0801 01/12/18 1225  GLUCAP 130* 145* 163* 169* 220* 207*    Imaging Dg Fluoro Guide Lumbar Puncture  Result Date: 01/12/2018 CLINICAL DATA:  Altered mental status. EXAM: DIAGNOSTIC LUMBAR PUNCTURE UNDER FLUOROSCOPIC GUIDANCE FLUOROSCOPY TIME:  Fluoroscopy Time:  18 seconds PROCEDURE: Informed consent was obtained from  the patient's wife by phone prior to the procedure, including potential complications of headache, allergy, and pain. With the patient prone, the lower back was prepped with Betadine. 1% Lidocaine was used for local anesthesia. Lumbar puncture was performed at the L3-4 level using a 21 gauge needle with return of clear colorless CSF with an opening pressure of 25 cm water. Twenty-two ml of CSF were obtained for laboratory studies. The patient tolerated the procedure well and there were no apparent complications. IMPRESSION: Diagnostic lumbar puncture performed without complication. Electronically Signed   By: Lorriane Shire M.D.   On: 01/12/2018  09:58      ANTIBIOTICS: As there are no white cells in the CSF I have discontinued his antibiotics on 4/15   DISCUSSION:     This is a 72 year old with type 2 diabetes and chronic atrial fibrillation who was initially admitted with dyspnea on exertion.  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  He subsequently has had intermittent seizures and depressed mental status.  ASSESSMENT / PLAN:  PULMONARY A: On reviewing his old films he did not appear to have any acute pulmonary pathology, either CHF or infiltrate.  I will be repeating his films in the morning.  Currently mental status precludes extubation.    CARDIOVASCULAR A: Atrial fibrillation is rate controlled, he is not a candidate for anticoagulation due to his subdural hematoma.  On exam he does not have overt evidence of CHF and will reevaluate after tomorrow's chest x-ray   GASTROINTESTINAL   HEMATOLOGIC   ENDOCRINE A: Glycemic control is marginal I will make adjustments as required     NEUROLOGIC A: We do not have evidence of a new structural insult to we have evidence of CNS infection.  I have ordered new metabolic panels in order to determine whether is a metabolic provocation for his decreased level of consciousness.  I have also ordered a TSH to see if we have  overcorrected his thyrotoxicosis   Lars Masson, MD [place] Pager: [phone]  01/12/2018, 1:49 PM

## 2018-01-12 NOTE — Procedures (Signed)
Lumbar puncture performed without complication at W8-6.

## 2018-01-12 NOTE — Progress Notes (Signed)
0413- Levemir given. New orders per phase 3 of ICU glycemic protocol.  4076- Insulin drip stopped.

## 2018-01-12 NOTE — Progress Notes (Signed)
Pt transported to and from fluoro on the vent with no complications.  RT will continue to monitor.

## 2018-01-12 NOTE — Progress Notes (Signed)
Neurology Progress Note   S:// Patient seen and examined.  No acute overnight events. Continue to be agitated overnight.  Required increasing sedation. Scheduled for fluoroscopy guided spinal tap this morning.   O:// Current vital signs: BP 138/74   Pulse 98   Temp 98.7 F (37.1 C) (Axillary)   Resp 13   Ht '5\' 9"'$  (1.753 m)   Wt 121.8 kg (268 lb 8.3 oz)   SpO2 100%   BMI 39.65 kg/m  Vital signs in last 24 hours: Temp:  [98.3 F (36.8 C)-98.7 F (37.1 C)] 98.7 F (37.1 C) (04/15 0400) Pulse Rate:  [74-101] 98 (04/15 0700) Resp:  [10-20] 13 (04/15 0700) BP: (121-168)/(57-88) 138/74 (04/15 0700) SpO2:  [100 %] 100 % (04/15 0700) FiO2 (%):  [40 %] 40 % (04/15 0440) Weight:  [121.8 kg (268 lb 8.3 oz)] 121.8 kg (268 lb 8.3 oz) (04/15 0500) General: Well-developed well-nourished in no apparent distress HEENT: Normocephalic atraumatic moist oral mucous membranes Cardiovascular: S1-S2 heard, pulses palpable throughout Lungs: Clear to auscultation, currently on vent. Abdomen: All 4 quadrants nontender, BS + Neurological exam Mental status: Sedated, intubated.  Not following any commands. Speech cannot be assessed due to the sedation and intubation. Cranial nerves: Pupils 2 mm sluggish bilaterally, no blink to threat, oculocephalics present, corneals present bilaterally, unable to assess facial symmetry. Motor exam: Moves all 4 extremities spontaneously per nursing report.  For me move bilateral upper extremities spontaneously but not to command.  Withdraws all 4 extremities to noxious tinnitus. Sensory exam: See above DTRs: Mute all over Cerebellar: Unable to assess due to sedation  Medications  Current Facility-Administered Medications:  .  [DISCONTINUED] acetaminophen (TYLENOL) tablet 650 mg, 650 mg, Oral, Q6H PRN, 650 mg at 12/27/17 1959 **OR** acetaminophen (TYLENOL) suppository 650 mg, 650 mg, Rectal, Q6H PRN, Truett Mainland, DO, 650 mg at 01/08/18 1622 .  ampicillin  (OMNIPEN) 2 g in sodium chloride 0.9 % 100 mL IVPB, 2 g, Intravenous, Q4H, Tarry Kos, MD, Last Rate: 300 mL/hr at 01/12/18 0756, 2 g at 01/12/18 0756 .  cefTRIAXone (ROCEPHIN) 2 g in sodium chloride 0.9 % 100 mL IVPB, 2 g, Intravenous, Q12H, Mannam, Praveen, MD, Stopped at 01/11/18 2139 .  chlorhexidine gluconate (MEDLINE KIT) (PERIDEX) 0.12 % solution 15 mL, 15 mL, Mouth Rinse, BID, Tarry Kos, MD, 15 mL at 01/12/18 0759 .  Chlorhexidine Gluconate Cloth 2 % PADS 6 each, 6 each, Topical, Daily, Mannam, Praveen, MD, 6 each at 01/12/18 0300 .  dexmedetomidine (PRECEDEX) 400 MCG/100ML (4 mcg/mL) infusion, 0.4-1.2 mcg/kg/hr, Intravenous, Titrated, Mannam, Praveen, MD, Last Rate: 22.9 mL/hr at 01/12/18 0700, 0.801 mcg/kg/hr at 01/12/18 0700 .  dextrose 10 % infusion, , Intravenous, Continuous PRN, Chase Caller, Murali, MD .  diltiazem (CARDIZEM) tablet 60 mg, 60 mg, Oral, Q8H, Tarry Kos, MD, 60 mg at 01/12/18 0540 .  feeding supplement (PRO-STAT SUGAR FREE 64) liquid 60 mL, 60 mL, Per Tube, 5 X Daily, Tarry Kos, MD, 60 mL at 01/12/18 0540 .  feeding supplement (VITAL HIGH PROTEIN) liquid 1,000 mL, 1,000 mL, Per Tube, Continuous, Tarry Kos, MD, Last Rate: 15 mL/hr at 01/12/18 0700 .  fentaNYL 2560mg in NS 2567m(1056mml) infusion-PREMIX, 50-200 mcg/hr, Intravenous, Continuous, SmiMauri BrooklynD, Last Rate: 15 mL/hr at 01/12/18 0700, 150 mcg/hr at 01/12/18 0700 .  haloperidol lactate (HALDOL) injection 5 mg, 5 mg, Intravenous, Q6H PRN, ShaManuella Ghaziratik D, DO, 5 mg at 01/05/18 0307 .  hydrALAZINE (APRESOLINE) injection 10 mg, 10  mg, Intravenous, Q6H PRN, Dhungel, Nishant, MD, 10 mg at 01/06/18 0709 .  insulin aspart (novoLOG) injection 3-9 Units, 3-9 Units, Subcutaneous, Q4H, Brand Males, MD, 9 Units at 01/12/18 0808 .  insulin aspart (novoLOG) injection 4 Units, 4 Units, Subcutaneous, Q4H, Brand Males, MD, 4 Units at 01/12/18 [phone] .  insulin detemir  (LEVEMIR) injection 12 Units, 12 Units, Subcutaneous, Q12H, Brand Males, MD, 12 Units at 01/12/18 0413 .  ipratropium-albuterol (DUONEB) 0.5-2.5 (3) MG/3ML nebulizer solution 3 mL, 3 mL, Nebulization, TID, Tarry Kos, MD, 3 mL at 01/11/18 2021 .  lactated ringers infusion, , Intravenous, Continuous, Tarry Kos, MD, Stopped at 01/07/18 1712 .  levalbuterol (XOPENEX) nebulizer solution 0.63 mg, 0.63 mg, Nebulization, Q2H PRN, Rai, Ripudeep K, MD, 0.63 mg at 01/08/18 1210 .  levETIRAcetam (KEPPRA) IVPB 1000 mg/100 mL premix, 1,000 mg, Intravenous, Q12H, Dhungel, Nishant, MD, Stopped at 01/12/18 0544 .  lidocaine (PF) (XYLOCAINE) 1 % injection 5 mL, 5 mL, Intradermal, Once, Tarry Kos, MD .  lidocaine (PF) (XYLOCAINE) 1 % injection, , , ,  .  LORazepam (ATIVAN) injection 2 mg, 2 mg, Intravenous, Q4H PRN, Dhungel, Nishant, MD .  MEDLINE mouth rinse, 15 mL, Mouth Rinse, 10 times per day, Mannam, Praveen, MD, 15 mL at 01/12/18 0535 .  methimazole (TAPAZOLE) tablet 20 mg, 20 mg, Per Tube, Q6H, Magdalen Spatz, NP, 20 mg at 01/12/18 0651 .  methylPREDNISolone sodium succinate (SOLU-MEDROL) 40 mg/mL injection 40 mg, 40 mg, Intravenous, Q12H, Tarry Kos, MD, 40 mg at 01/11/18 2109 .  multivitamin liquid 15 mL, 15 mL, Per Tube, Daily, Tarry Kos, MD, 15 mL at 01/11/18 0940 .  ondansetron (ZOFRAN) tablet 4 mg, 4 mg, Oral, Q6H PRN **OR** ondansetron (ZOFRAN) injection 4 mg, 4 mg, Intravenous, Q6H PRN, Truett Mainland, DO .  pantoprazole sodium (PROTONIX) 40 mg/20 mL oral suspension 40 mg, 40 mg, Per Tube, QHS, Susa Raring, RPH, 40 mg at 01/11/18 2109 .  propofol (DIPRIVAN) 1000 MG/100ML infusion, 5-80 mcg/kg/min, Intravenous, Titrated, Tarry Kos, MD, Last Rate: 13.2 mL/hr at 01/10/18 0700, 19.928 mcg/kg/min at 01/10/18 0700 .  sodium chloride flush (NS) 0.9 % injection 10-40 mL, 10-40 mL, Intracatheter, Q12H, Dhungel, Nishant, MD, 10 mL at  01/11/18 2109 .  sodium chloride flush (NS) 0.9 % injection 10-40 mL, 10-40 mL, Intracatheter, PRN, Dhungel, Nishant, MD, 10 mL at 12/29/17 2156 .  vancomycin (VANCOCIN) IVPB 750 mg/150 ml premix, 750 mg, Intravenous, Q12H, Susa Raring, [place], Stopped at 01/12/18 0241 Labs CBC    Component Value Date/Time   WBC 9.3 01/12/2018 0356   RBC 3.10 (L) 01/12/2018 0356   HGB 8.3 (L) 01/12/2018 0356   HCT 27.4 (L) 01/12/2018 0356   PLT 251 01/12/2018 0356   MCV 88.4 01/12/2018 0356   MCH 26.8 01/12/2018 0356   MCHC 30.3 01/12/2018 0356   RDW 17.2 (H) 01/12/2018 0356   LYMPHSABS 0.1 (L) 01/12/2018 0356   MONOABS 0.1 01/12/2018 0356   EOSABS 0.0 01/12/2018 0356   BASOSABS 0.0 01/12/2018 0356    CMP     Component Value Date/Time   NA 151 (H) 01/12/2018 0356   K 5.2 (H) 01/12/2018 0356   CL 121 (H) 01/12/2018 0356   CO2 23 01/12/2018 0356   GLUCOSE 175 (H) 01/12/2018 0356   BUN 82 (H) 01/12/2018 0356   CREATININE 1.20 01/12/2018 0356   CALCIUM 11.2 (H) 01/12/2018 0356   PROT 5.5 (L) 01/08/2018 1110   ALBUMIN  2.0 (L) 01/08/2018 1110   AST 19 01/08/2018 1110   ALT 72 (H) 01/08/2018 1110   ALKPHOS 75 01/08/2018 1110   BILITOT 0.9 01/08/2018 1110   GFRNONAA 60 (L) 01/12/2018 0356   GFRAA >60 01/12/2018 0356    Imaging I have reviewed images in epic and the results pertinent to this consultation are: CT-scan of the brain -01/07/2018-right subdural hematoma, compared to 01/03/2018 is stable.  No ICH.  No mass-effect. MRI examination of the brain-12/29/17 and 01/10/18 shows acute to subacute right holohemispheric subdural hematoma with residual Falco tentorial subdural hematoma and near complete resolution of the left subdural hematoma.  There is also extensive increased signal in the subarachnoid space on the right side on the FLAIR image suggestive of proteinaceous fluid-?  Blood,?  Carcinomatous meningitis?  Other malignant/effusion processes.  Assessment:  Patient is a 72 year old man  who presented with past medical history of atrial fibrillation, stage III chronic kidney disease, diabetes, adenocarcinoma of the colon status post resection and current colostomy, history of renal cell carcinoma status post resection admitted to the hospital on 12/10/2017 for shortness of breath, supratherapeutic INR, A. fib with RVR and pulmonary edema. Also diagnosed with thyrotoxicosis and bilateral subdurals.  Had a seizure and a subdural hematoma, for which neurological consultation was placed. A right-sided subdural fluid collection in conjunction with a poss He was intubated on 12/30/2017 for increased work of breathing transferred to ICU, was extubated and transferred out of the ICU on 01/02/2018 to the floor where he shortly stated before returning back to the ICU on 01/03/2018 when he became acutely dyspneic and tachypneic.  He was reintubated again on 01/06/2018. Neurology consultation was placed for encephalopathy. Most likely his encephalopathy is multifactorial-metabolic as well as related to the large bilateral subdurals. His MRI imaging is concerning for abnormal signal in the subarachnoid space on top of the subdural hematomas which could have a broad differential including bleed, carcinomatous meningitis, lymphoma and other malignant processes.  Excellent neurosurgery was consulted and they feel it safe for the patient to obtain a spinal tap.  He will be obtaining a spinal tap under fluoroscopic guidance.  Impression: Multifactorial toxic metabolic encephalopathy Bilateral subdural hematomas-worse on the right, resulting in the left. Hypothyroidism Evaluate for CNS infection or malignancy  Recommendations: Currently on meningitic coverage-continue ceftriaxone, vancomycin and ampicillin. Fluoroscopic guided spinal tap with CSF for bacterial/fungal studies, flow cytometry, cytology Continue Keppra for seizure prophylaxis Maintain seizure precautions We will continue to follow with  you.    -- Amie Portland, MD [facility] Pager: [phone] If 7pm to 7am, please call on call as listed on AMION.  CRITICAL CARE ATTESTATION This patient is critically ill and at significant risk of neurological worsening, death and care requires constant monitoring of vital signs, hemodynamics,respiratory and cardiac monitoring. I spent 35  minutes of neurocritical care time performing neurological assessment, discussion with family, other specialists and medical decision making of high complexityin the care of  this patient.

## 2018-01-13 ENCOUNTER — Inpatient Hospital Stay (HOSPITAL_COMMUNITY): Payer: Medicare Other

## 2018-01-13 LAB — BLOOD GAS, ARTERIAL
Acid-base deficit: 0.5 mmol/L (ref 0.0–2.0)
Bicarbonate: 24.8 mmol/L (ref 20.0–28.0)
DRAWN BY: 36496
FIO2: 40
MECHVT: 570 mL
O2 Saturation: 97.8 %
PEEP: 5 cmH2O
Patient temperature: 98.5
RATE: 14 resp/min
pCO2 arterial: 49 mmHg — ABNORMAL HIGH (ref 32.0–48.0)
pH, Arterial: 7.324 — ABNORMAL LOW (ref 7.350–7.450)
pO2, Arterial: 108 mmHg (ref 83.0–108.0)

## 2018-01-13 LAB — GLUCOSE, CAPILLARY
GLUCOSE-CAPILLARY: 150 mg/dL — AB (ref 65–99)
GLUCOSE-CAPILLARY: 164 mg/dL — AB (ref 65–99)
GLUCOSE-CAPILLARY: 166 mg/dL — AB (ref 65–99)
GLUCOSE-CAPILLARY: 177 mg/dL — AB (ref 65–99)
GLUCOSE-CAPILLARY: 223 mg/dL — AB (ref 65–99)
Glucose-Capillary: 183 mg/dL — ABNORMAL HIGH (ref 65–99)

## 2018-01-13 LAB — CBC WITH DIFFERENTIAL/PLATELET
Basophils Absolute: 0 10*3/uL (ref 0.0–0.1)
Basophils Relative: 0 %
EOS ABS: 0 10*3/uL (ref 0.0–0.7)
Eosinophils Relative: 0 %
HCT: 28.5 % — ABNORMAL LOW (ref 39.0–52.0)
Hemoglobin: 8.3 g/dL — ABNORMAL LOW (ref 13.0–17.0)
LYMPHS ABS: 0.4 10*3/uL — AB (ref 0.7–4.0)
LYMPHS PCT: 5 %
MCH: 25.9 pg — AB (ref 26.0–34.0)
MCHC: 29.1 g/dL — AB (ref 30.0–36.0)
MCV: 89.1 fL (ref 78.0–100.0)
MONOS PCT: 2 %
Monocytes Absolute: 0.1 10*3/uL (ref 0.1–1.0)
Neutro Abs: 8.3 10*3/uL — ABNORMAL HIGH (ref 1.7–7.7)
Neutrophils Relative %: 93 %
Platelets: 238 10*3/uL (ref 150–400)
RBC: 3.2 MIL/uL — ABNORMAL LOW (ref 4.22–5.81)
RDW: 17.1 % — ABNORMAL HIGH (ref 11.5–15.5)
WBC: 8.8 10*3/uL (ref 4.0–10.5)

## 2018-01-13 LAB — COMPREHENSIVE METABOLIC PANEL
ALT: 205 U/L — AB (ref 17–63)
AST: 22 U/L (ref 15–41)
Albumin: 1.9 g/dL — ABNORMAL LOW (ref 3.5–5.0)
Alkaline Phosphatase: 95 U/L (ref 38–126)
Anion gap: 6 (ref 5–15)
BUN: 74 mg/dL — AB (ref 6–20)
CHLORIDE: 127 mmol/L — AB (ref 101–111)
CO2: 23 mmol/L (ref 22–32)
CREATININE: 1.07 mg/dL (ref 0.61–1.24)
Calcium: 10.9 mg/dL — ABNORMAL HIGH (ref 8.9–10.3)
GFR calc non Af Amer: >60 mL/min (ref >60)
Glucose, Bld: 177 mg/dL — ABNORMAL HIGH (ref 65–99)
Potassium: 4.6 mmol/L (ref 3.5–5.1)
SODIUM: 156 mmol/L — AB (ref 135–145)
Total Bilirubin: 0.7 mg/dL (ref 0.3–1.2)
Total Protein: 5.1 g/dL — ABNORMAL LOW (ref 6.5–8.1)

## 2018-01-13 LAB — AMMONIA: AMMONIA: 18 umol/L (ref 9–35)

## 2018-01-13 LAB — TSH

## 2018-01-13 MED ORDER — IPRATROPIUM-ALBUTEROL 0.5-2.5 (3) MG/3ML IN SOLN: 3.0000 mL | RESPIRATORY_TRACT | Status: DC | PRN

## 2018-01-13 NOTE — Progress Notes (Addendum)
Neurology Progress Note   S:// No acute changes overnight.  Still not following any commands per nursing.  Becomes agitated off sedation.   O:// Current vital signs: BP (!) 147/92   Pulse 92   Temp 98.5 F (36.9 C) (Axillary)   Resp (!) 28   Ht '5\' 9"'$  (1.753 m)   Wt 119.3 kg (263 lb 0.1 oz)   SpO2 100%   BMI 38.84 kg/m  Vital signs in last 24 hours: Temp:  [97.9 F (36.6 C)-98.5 F (36.9 C)] 98.5 F (36.9 C) (04/16 0800) Pulse Rate:  [83-113] 92 (04/16 0900) Resp:  [12-28] 28 (04/16 0900) BP: (110-158)/(62-95) 147/92 (04/16 0900) SpO2:  [100 %] 100 % (04/16 0900) FiO2 (%):  [40 %] 40 % (04/16 0800) Weight:  [119.3 kg (263 lb 0.1 oz)] 119.3 kg (263 lb 0.1 oz) (04/16 0400) General: Well-developed well-nourished in no apparent distress. HEENT: Intubated, normocephalic, atraumatic. Cardiovascular: S1-S2 heard, possible palpable throughout Respirate: Clear to auscultation, currently on ventilator Abdomen: Nontender nondistended Neurological exam Mental status: Sedated intubated.  Opens eyes to voice.  Does not follow any commands.  Unable to assess speech due to intubation. Cranial nerves: Pupils 2 mm sluggishly reactive bilaterally, no blink to threat from either side, oculocephalics present, corneals present bilaterally, unable to assess for facial symmetry. Motor exam: Does not follow commands but moves all 4 extremities spontaneously as well as to noxious stim. Sensory exam: See above Cerebellar: Could not assess due to patient not cooperating. Deep tendon reflexes are mute.  Plantars are mute bilaterally.  Medications  Current Facility-Administered Medications:  .  [DISCONTINUED] acetaminophen (TYLENOL) tablet 650 mg, 650 mg, Oral, Q6H PRN, 650 mg at 12/27/17 1959 **OR** acetaminophen (TYLENOL) suppository 650 mg, 650 mg, Rectal, Q6H PRN, Truett Mainland, DO, 650 mg at 01/08/18 1622 .  chlorhexidine gluconate (MEDLINE KIT) (PERIDEX) 0.12 % solution 15 mL, 15 mL, Mouth  Rinse, BID, Tarry Kos, MD, 15 mL at 01/13/18 0906 .  Chlorhexidine Gluconate Cloth 2 % PADS 6 each, 6 each, Topical, Daily, Mannam, Praveen, MD, 6 each at 01/13/18 0400 .  dexmedetomidine (PRECEDEX) 400 MCG/100ML (4 mcg/mL) infusion, 0.4-1.2 mcg/kg/hr, Intravenous, Titrated, Mannam, Praveen, MD, Last Rate: 22.9 mL/hr at 01/13/18 0900, 0.801 mcg/kg/hr at 01/13/18 0900 .  dextrose 10 % infusion, , Intravenous, Continuous PRN, Chase Caller, Murali, MD .  diltiazem (CARDIZEM) tablet 60 mg, 60 mg, Oral, Q8H, Tarry Kos, MD, 60 mg at 01/13/18 2505 .  feeding supplement (PRO-STAT SUGAR FREE 64) liquid 60 mL, 60 mL, Per Tube, 5 X Daily, Tarry Kos, MD, 60 mL at 01/13/18 0907 .  feeding supplement (VITAL HIGH PROTEIN) liquid 1,000 mL, 1,000 mL, Per Tube, Continuous, Tarry Kos, MD, Last Rate: 15 mL/hr at 01/13/18 0900 .  fentaNYL 2515mg in NS 2539m(1062mml) infusion-PREMIX, 50-200 mcg/hr, Intravenous, Continuous, SmiMauri BrooklynD, Last Rate: 5 mL/hr at 01/13/18 0900, 50 mcg/hr at 01/13/18 0900 .  haloperidol lactate (HALDOL) injection 5 mg, 5 mg, Intravenous, Q6H PRN, ShaManuella Ghaziratik D, DO, 5 mg at 01/05/18 0307 .  hydrALAZINE (APRESOLINE) injection 10 mg, 10 mg, Intravenous, Q6H PRN, Dhungel, Nishant, MD, 10 mg at 01/06/18 0709 .  insulin aspart (novoLOG) injection 3-9 Units, 3-9 Units, Subcutaneous, Q4H, RamBrand MalesD, 6 Units at 01/13/18 0908 .  insulin aspart (novoLOG) injection 4 Units, 4 Units, Subcutaneous, Q4H, RamBrand MalesD, 4 Units at 01/13/18 0908 .  insulin detemir (LEVEMIR) injection 12 Units, 12 Units, Subcutaneous, Q12H, RamBrand MalesD, 12  Units at 01/12/18 2221 .  ipratropium-albuterol (DUONEB) 0.5-2.5 (3) MG/3ML nebulizer solution 3 mL, 3 mL, Nebulization, Q4H PRN, Tarry Kos, MD .  lactated ringers infusion, , Intravenous, Continuous, Tarry Kos, MD, Stopped at 01/07/18 1712 .  levETIRAcetam (KEPPRA) IVPB 1000  mg/100 mL premix, 1,000 mg, Intravenous, Q12H, Dhungel, Nishant, MD, Stopped at 01/13/18 0550 .  LORazepam (ATIVAN) injection 2 mg, 2 mg, Intravenous, Q4H PRN, Dhungel, Nishant, MD, 2 mg at 01/13/18 0022 .  MEDLINE mouth rinse, 15 mL, Mouth Rinse, 10 times per day, Mannam, Praveen, MD, 15 mL at 01/13/18 0909 .  methimazole (TAPAZOLE) tablet 20 mg, 20 mg, Per Tube, Q6H, Magdalen Spatz, NP, 20 mg at 01/13/18 0810 .  methylPREDNISolone sodium succinate (SOLU-MEDROL) 40 mg/mL injection 40 mg, 40 mg, Intravenous, Q12H, Tarry Kos, MD, 40 mg at 01/13/18 0909 .  multivitamin liquid 15 mL, 15 mL, Per Tube, Daily, Tarry Kos, MD, 15 mL at 01/13/18 0909 .  ondansetron (ZOFRAN) tablet 4 mg, 4 mg, Oral, Q6H PRN **OR** ondansetron (ZOFRAN) injection 4 mg, 4 mg, Intravenous, Q6H PRN, Truett Mainland, DO .  pantoprazole sodium (PROTONIX) 40 mg/20 mL oral suspension 40 mg, 40 mg, Per Tube, QHS, Susa Raring, RPH, 40 mg at 01/12/18 2221 .  propofol (DIPRIVAN) 1000 MG/100ML infusion, 5-80 mcg/kg/min, Intravenous, Titrated, Tarry Kos, MD, Last Rate: 13.2 mL/hr at 01/10/18 0700, 19.928 mcg/kg/min at 01/10/18 0700 .  sodium chloride flush (NS) 0.9 % injection 10-40 mL, 10-40 mL, Intracatheter, Q12H, Dhungel, Nishant, MD, 10 mL at 01/13/18 0910 .  sodium chloride flush (NS) 0.9 % injection 10-40 mL, 10-40 mL, Intracatheter, PRN, Dhungel, Nishant, MD, 10 mL at 12/29/17 2156 Labs CBC    Component Value Date/Time   WBC 8.8 01/13/2018 0421   RBC 3.20 (L) 01/13/2018 0421   HGB 8.3 (L) 01/13/2018 0421   HCT 28.5 (L) 01/13/2018 0421   PLT 238 01/13/2018 0421   MCV 89.1 01/13/2018 0421   MCH 25.9 (L) 01/13/2018 0421   MCHC 29.1 (L) 01/13/2018 0421   RDW 17.1 (H) 01/13/2018 0421   LYMPHSABS 0.4 (L) 01/13/2018 0421   MONOABS 0.1 01/13/2018 0421   EOSABS 0.0 01/13/2018 0421   BASOSABS 0.0 01/13/2018 0421    CMP     Component Value Date/Time   NA 156 (H) 01/13/2018 0421   K 4.6  01/13/2018 0421   CL 127 (H) 01/13/2018 0421   CO2 23 01/13/2018 0421   GLUCOSE 177 (H) 01/13/2018 0421   BUN 74 (H) 01/13/2018 0421   CREATININE 1.07 01/13/2018 0421   CALCIUM 10.9 (H) 01/13/2018 0421   PROT 5.1 (L) 01/13/2018 0421   ALBUMIN 1.9 (L) 01/13/2018 0421   AST 22 01/13/2018 0421   ALT 205 (H) 01/13/2018 0421   ALKPHOS 95 01/13/2018 0421   BILITOT 0.7 01/13/2018 0421   GFRNONAA >60 01/13/2018 0421   GFRAA >60 01/13/2018 0421     Imaging-no new imaging. I have reviewed images in epic and the results pertinent to this consultation are: CT-scan of the brain -01/07/2018-right subdural hematoma, compared to 01/03/2018 is stable.  No ICH.  No mass-effect. MRI examination of the brain-12/29/17 and 01/10/18 shows acute to subacute right holohemispheric subdural hematoma with residual Falco tentorial subdural hematoma and near complete resolution of the left subdural hematoma.  There is also extensive increased signal in the subarachnoid space on the right side on the FLAIR image suggestive of proteinaceous fluid-?  Blood,?  Carcinomatous meningitis?  Other malignant/effusion processes.  Assessment:  72 year old man with past history of atrial fibrillation, stage III chronic kidney disease, diabetes, adenocarcinoma of the colon status post resection and current colostomy, history of renal cell carcinoma status post resection, admitted on 12/17/2017 for shortness of breath, supratherapeutic INR, A. fib with RVR and pulmonary edema.  Also had thyrotoxicosis.  Brain imaging revealed bilateral subdurals.  Also had a seizure.  Neurological consultation was placed for management of the seizures and altered mental status. Imaging revealed a new right-sided subdural fluid collection in conjunction with resolving or resolved left-sided subdural. He was intubated on 12/30/2017 for increased work of breathing transferred to ICU extubated and transferred out of the ICU reintubated again on 01/02/2018 back to  the ICU on 01/03/2018, extubated again and intubated on 01/06/2018. Neurology consultation again for encephalopathy. Given his imaging findings, it is most likely that his current mental status is secondary to the bilateral subdurals.  The right-sided subdural also has some subarachnoid component. He got a spinal tap done yesterday.  Initial results are not overwhelming.  Cytology and flow cytometry are pending at this time with concern for carcinomatous spread versus customers meningitis versus another process going on at this time. My suspicion for any autoimmune process is low given the lack of changes on the brain scans that could be suggestive of vasculitis or other pathology.  Using steroids at this time, in someone with altered mental status with gets agitated, I think would be counterproductive if we do not really have a clear idea of the condition being treated. I would imagine he would take a long time to clear his mental status given the severe thyrotoxicosis as well as presence of bilateral subdural hematomas.  Impression: Multifactorial toxic metabolic encephalopathy secondary to: -Bilateral subdural hematomas -Thyrotoxicosis  Recommendations: Management of toxic/metabolic d derangements per primary team as you are. Check ammonia levels Pending cytology and flow cytometry from CSF Frequent neurochecks Management of thyrotoxicosis per primary team. Plan discussed and relayed to Dr. Marolyn Hammock in the ICU.  -- Amie Portland, MD [facility] Pager: [phone] If 7pm to 7am, please call on call as listed on AMION.   CRITICAL CARE ATTESTATION This patient is critically ill and at significant risk of neurological worsening, death and care requires constant monitoring of vital signs, hemodynamics,respiratory and cardiac monitoring. I spent 35  minutes of neurocritical care time performing neurological assessment, discussion with family, other specialists and medical decision  making of high complexityin the care of  this patient.

## 2018-01-13 NOTE — Progress Notes (Signed)
PULMONARY / CRITICAL CARE MEDICINE   Name: [name] MRN: [id number] DOB: 29-Jun-1946    ADMISSION DATE:  12/06/2017   HISTORY OF PRESENT ILLNESS:        This is a 72 year-old with a history of type 2 diabetes, chronic atrial fibrillation, and chronic kidney disease who was initially admitted on 3/29 complaining of dyspnea on exertion.  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  He has had a decline in his mental status and intermittent seizure activity and has required intubation and mechanical ventilation.  CT scan of the head has shown a right subdural hematoma without mass-effect.  An MRI which was obtained yesterday suggested some enhancement of the meninges therefore an LP was performed on 4/15.  There are no white cells in that fluid and the protein is 74.   He remains intubated and mechanically ventilated this morning.  He is currently sedated on 75 mcg of fentanyl and 0.6 of Precedex.  When awakened he is not interactive.  He was bradycardic yesterday and his sodium channel blocker was held. PAST MEDICAL HISTORY :  He  has a past medical history of Atrial fibrillation ([name]), Cancer ([name]), Chronic anticoagulation, Diabetes mellitus without complication ([name]), and Hypertension.  PAST SURGICAL HISTORY: He  has a past surgical history that includes Colostomy.  No Known Allergies  No current facility-administered medications on file prior to encounter.    Current Outpatient Medications on File Prior to Encounter  Medication Sig  . ANORO ELLIPTA 62.5-25 MCG/INH AEPB TAKE 1 PUFF BY MOUTH EVERY DAY  . CALCIUM PO Take 200 mg by mouth daily.   . Cholecalciferol 1000 units tablet Take 1,000 Units by mouth daily.  Marland Kitchen dicyclomine (BENTYL) 20 MG tablet Take 20 mg by mouth 3 (three) times daily as needed for spasms.   Marland Kitchen diltiazem (CARDIZEM) 60 MG tablet Take 60 mg by mouth 4 (four) times daily.   . DULoxetine  (CYMBALTA) 20 MG capsule Take 20 mg by mouth daily.  Marland Kitchen gabapentin (NEURONTIN) 300 MG capsule Take 300 mg by mouth daily.   Marland Kitchen HYDROcodone-acetaminophen (NORCO) 10-325 MG tablet Take 1 tablet by mouth every 6 (six) hours as needed for moderate pain.   . IRON PO Take 55 mg by mouth daily.   Marland Kitchen LYRICA 100 MG capsule Take 100 mg by mouth 3 (three) times daily.   . metFORMIN (GLUCOPHAGE) 1000 MG tablet Take 1,000 mg by mouth 2 (two) times daily.   . Omega-3 Fatty Acids (FISH OIL PO) Take 1,000 mg by mouth daily.   . pioglitazone (ACTOS) 45 MG tablet Take 45 mg by mouth daily.   . Probiotic Product (PROBIOTIC PO) Take 1 tablet by mouth daily.   . simvastatin (ZOCOR) 80 MG tablet Take 40 mg by mouth daily.   Marland Kitchen warfarin (COUMADIN) 5 MG tablet Take 5-10 tablets by mouth as directed. 10 mg on M W TH FRI 5 mg TUES SAT    FAMILY HISTORY:  His indicated that the status of his mother is unknown. He indicated that the status of his father is unknown.   SOCIAL HISTORY: He  reports that he has quit smoking. He has never used smokeless tobacco. He reports that he drinks alcohol. He reports that he does not use drugs.  REVIEW OF SYSTEMS:   Not obtainable  SUBJECTIVE:  Unobtainable  VITAL SIGNS: BP (!) 156/84   Pulse 83   Temp 98.5 F (  36.9 C) (Axillary)   Resp 14   Ht 5\' 9"  (1.753 m)   Wt 263 lb 0.1 oz (119.3 kg)   SpO2 100%   BMI 38.84 kg/m   HEMODYNAMICS:    VENTILATOR SETTINGS: Vent Mode: PRVC FiO2 (%):  [40 %] 40 % Set Rate:  [14 bmp] 14 bmp Vt Set:  [570 mL] 570 mL PEEP:  [5 cmH20] 5 cmH20 Plateau Pressure:  [10 cmH20-15 cmH20] 10 cmH20  INTAKE / OUTPUT: I/O last 3 completed shifts: In: 2449.2 [I.V.:1074.2; NG/GT:525; IV PPJKDTOIZ:124] Out: 5809 [Urine:5202; Stool:600]  PHYSICAL EXAMINATION: General: This is an obese male who is orally intubated mechanically ventilated and in no distress. Neuro: He does not respond to voice but can be awakened with minimal noxious stimuli.   He eye opens and randomly moves both upper extremities vigorously when awakened.  He does not interact he does not track.  Pupils are equal.   Cardiac: S1 and S2 are irregularly irregular without murmur rub or gallop.  Lungs: Respirations are unlabored, he is breathing above the set ventilator rate.  There is symmetric air movement, there are no wheezes.   Abdomen: The abdomen is obese and soft without any organomegaly masses or tenderness.  There is a functioning right lower quadrant ostomy  musculoskeletal: Does not have edema   LABS:  BMET Recent Labs  Lab 01/11/18 0500 01/12/18 0356 01/13/18 0421  NA 146* 151* 156*  K 5.7* 5.2* 4.6  CL 119* 121* 127*  CO2 20* 23 23  BUN 82* 82* 74*  CREATININE 1.34* 1.20 1.07  GLUCOSE 301* 175* 177*    Electrolytes Recent Labs  Lab 01/09/18 0413  01/11/18 0500 01/12/18 0356 01/13/18 0421  CALCIUM 10.0   < > 11.2* 11.2* 10.9*  MG 1.9  --   --   --   --   PHOS 2.9  --   --   --   --    < > = values in this interval not displayed.    CBC Recent Labs  Lab 01/11/18 0500 01/12/18 0356 01/13/18 0421  WBC 10.7* 9.3 8.8  HGB 7.9* 8.3* 8.3*  HCT 26.2* 27.4* 28.5*  PLT 250 251 238    Coag's No results for input(s): APTT, INR in the last 168 hours.  Sepsis Markers Recent Labs  Lab 01/06/18 0838 01/07/18 0434 01/08/18 0421  PROCALCITON <0.10 <0.10 <0.10    ABG Recent Labs  Lab 01/09/18 0353 01/10/18 0415 01/13/18 0437  PHART 7.363 7.299* 7.324*  PCO2ART 39.6 44.2 49.0*  PO2ART 137* 116* 108    Liver Enzymes Recent Labs  Lab 01/08/18 1110 01/13/18 0421  AST 19 22  ALT 72* 205*  ALKPHOS 75 95  BILITOT 0.9 0.7  ALBUMIN 2.0* 1.9*    Cardiac Enzymes Recent Labs  Lab 01/09/18 0413  TROPONINI 0.13*    Glucose Recent Labs  Lab 01/12/18 0801 01/12/18 1225 01/12/18 1617 01/12/18 2019 01/13/18 0008 01/13/18 0346  GLUCAP 220* 207* 182* 202* 223* 177*    Imaging Dg Chest Port 1 View  Result Date:  01/13/2018 CLINICAL DATA:  Shortness of breath EXAM: PORTABLE CHEST 1 VIEW COMPARISON:  01/08/2018 FINDINGS: Support devices are stable. Cardiomegaly. Bilateral lower lobe airspace opacities, left greater than right, new since prior study. Suspect small left effusion. IMPRESSION: Support devices are stable. Increasing bilateral lower lobe atelectasis or infiltrates, left greater than right. Suspect layering left effusion. Electronically Signed   By: Rolm Baptise M.D.   On: 01/13/2018 07:42  Dg Fluoro Guide Lumbar Puncture  Result Date: 01/12/2018 CLINICAL DATA:  Altered mental status. EXAM: DIAGNOSTIC LUMBAR PUNCTURE UNDER FLUOROSCOPIC GUIDANCE FLUOROSCOPY TIME:  Fluoroscopy Time:  18 seconds PROCEDURE: Informed consent was obtained from the patient's wife by phone prior to the procedure, including potential complications of headache, allergy, and pain. With the patient prone, the lower back was prepped with Betadine. 1% Lidocaine was used for local anesthesia. Lumbar puncture was performed at the L3-4 level using a 21 gauge needle with return of clear colorless CSF with an opening pressure of 25 cm water. Twenty-two ml of CSF were obtained for laboratory studies. The patient tolerated the procedure well and there were no apparent complications. IMPRESSION: Diagnostic lumbar puncture performed without complication. Electronically Signed   By: Lorriane Shire M.D.   On: 01/12/2018 09:58      ANTIBIOTICS: As there are no white cells in the CSF I have discontinued his antibiotics on 4/15   DISCUSSION:     This is a 72 year old with type 2 diabetes and chronic atrial fibrillation who was initially admitted with dyspnea on exertion.  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  He subsequently has had intermittent seizures and depressed mental status.  ASSESSMENT / PLAN:  PULMONARY A: X-ray today is really unremarkable for a large heart.  There may be some subtle increased markings in the  right lower lobe but he has no other concurrent indication of infection.  His gas exchange is reasonably good and I am hopeful that with appropriate adjustment of his sedation we will be able to perform a spontaneous breathing trial today.   CARDIOVASCULAR A: Atrial fibrillation is rate controlled, he is not a candidate for anticoagulation due to his subdural hematoma.  On exam he does not have overt evidence of CHF.   GASTROINTESTINAL   HEMATOLOGIC   ENDOCRINE A: Glycemic control is marginal I will make adjustments as required     NEUROLOGIC A: He appears to be overtly delirious In addition to having a persistent subdural hematoma.  Looking for metabolic provocations for delirium I note that his TSH is still undetectable and he has an elevated ALT.  I am continuing his Tapazole for his hyperthyroid state and I have ordered a serum ammonia level.  For now I will be controlling his delirium with an increased dose of Precedex.   Lars Masson, MD Critical Care Medicine [facility] Pager: [phone]  01/13/2018, 7:51 AM

## 2018-01-14 DIAGNOSIS — I10 Essential (primary) hypertension: Secondary | ICD-10-CM

## 2018-01-14 LAB — BLOOD GAS, ARTERIAL
Acid-Base Excess: 1.5 mmol/L (ref 0.0–2.0)
Bicarbonate: 26.5 mmol/L (ref 20.0–28.0)
Drawn by: 441351
FIO2: 40
LHR: 14 {breaths}/min
MECHVT: 570 mL
O2 SAT: 98.4 %
PATIENT TEMPERATURE: 98.3
PEEP: 5 cmH2O
pCO2 arterial: 48.7 mmHg — ABNORMAL HIGH (ref 32.0–48.0)
pH, Arterial: 7.354 (ref 7.350–7.450)
pO2, Arterial: 126 mmHg — ABNORMAL HIGH (ref 83.0–108.0)

## 2018-01-14 LAB — BASIC METABOLIC PANEL
ANION GAP: 7 (ref 5–15)
BUN: 71 mg/dL — ABNORMAL HIGH (ref 6–20)
CHLORIDE: 127 mmol/L — AB (ref 101–111)
CO2: 24 mmol/L (ref 22–32)
Calcium: 11.2 mg/dL — ABNORMAL HIGH (ref 8.9–10.3)
Creatinine, Ser: 0.95 mg/dL (ref 0.61–1.24)
GFR calc non Af Amer: >60 mL/min (ref >60)
Glucose, Bld: 98 mg/dL (ref 65–99)
Potassium: 4.5 mmol/L (ref 3.5–5.1)
Sodium: 158 mmol/L — ABNORMAL HIGH (ref 135–145)

## 2018-01-14 LAB — GLUCOSE, CAPILLARY
GLUCOSE-CAPILLARY: 158 mg/dL — AB (ref 65–99)
GLUCOSE-CAPILLARY: 163 mg/dL — AB (ref 65–99)
GLUCOSE-CAPILLARY: 168 mg/dL — AB (ref 65–99)
GLUCOSE-CAPILLARY: 173 mg/dL — AB (ref 65–99)
Glucose-Capillary: 65 mg/dL (ref 65–99)
Glucose-Capillary: 68 mg/dL (ref 65–99)
Glucose-Capillary: 105 mg/dL — ABNORMAL HIGH (ref 65–99)
Glucose-Capillary: 152 mg/dL — ABNORMAL HIGH (ref 65–99)
Glucose-Capillary: 164 mg/dL — ABNORMAL HIGH (ref 65–99)

## 2018-01-14 LAB — CBC WITH DIFFERENTIAL/PLATELET
Basophils Absolute: 0 10*3/uL (ref 0.0–0.1)
Basophils Relative: 0 %
Eosinophils Absolute: 0 10*3/uL (ref 0.0–0.7)
Eosinophils Relative: 0 %
HEMATOCRIT: 30 % — AB (ref 39.0–52.0)
HEMOGLOBIN: 8.8 g/dL — AB (ref 13.0–17.0)
LYMPHS ABS: 0.5 10*3/uL — AB (ref 0.7–4.0)
LYMPHS PCT: 5 %
MCH: 26.3 pg (ref 26.0–34.0)
MCHC: 29.3 g/dL — AB (ref 30.0–36.0)
MCV: 89.6 fL (ref 78.0–100.0)
MONOS PCT: 2 %
Monocytes Absolute: 0.2 10*3/uL (ref 0.1–1.0)
NEUTROS ABS: 10 10*3/uL — AB (ref 1.7–7.7)
NEUTROS PCT: 93 %
Platelets: 222 10*3/uL (ref 150–400)
RBC: 3.35 MIL/uL — ABNORMAL LOW (ref 4.22–5.81)
RDW: 17.3 % — ABNORMAL HIGH (ref 11.5–15.5)
WBC: 10.7 10*3/uL — ABNORMAL HIGH (ref 4.0–10.5)

## 2018-01-14 LAB — HEPATITIS PANEL, ACUTE
Hep A IgM: NEGATIVE
Hep B C IgM: NEGATIVE
Hepatitis B Surface Ag: NEGATIVE

## 2018-01-14 MED ORDER — DEXTROSE 5 % IV SOLN: INTRAVENOUS | Status: DC

## 2018-01-14 MED ORDER — VITAL HIGH PROTEIN PO LIQD
1000.0000 mL | ORAL | Status: DC
  Administered 2018-01-14 – 2018-01-18 (×4): 1000 mL
  Filled 2018-01-14: qty 1000

## 2018-01-14 MED ORDER — PRO-STAT SUGAR FREE PO LIQD
60.0000 mL | Freq: Three times a day (TID) | ORAL | Status: DC
  Administered 2018-01-14 – 2018-01-18 (×11): 60 mL
  Filled 2018-01-14 (×11): qty 60

## 2018-01-14 MED ORDER — DEXTROSE 50 % IV SOLN
INTRAVENOUS | Status: AC
  Administered 2018-01-14: 50 mL
  Filled 2018-01-14: qty 50

## 2018-01-14 MED ORDER — FREE WATER
250.0000 mL | Freq: Four times a day (QID) | Status: DC
  Administered 2018-01-14 – 2018-01-18 (×18): 250 mL

## 2018-01-14 MED ORDER — DEXTROSE 5 % IV SOLN
INTRAVENOUS | Status: DC
  Administered 2018-01-14: 10:00:00 via INTRAVENOUS
  Administered 2018-01-15: 1000 mL via INTRAVENOUS
  Administered 2018-01-15 – 2018-01-18 (×4): via INTRAVENOUS

## 2018-01-14 NOTE — Progress Notes (Signed)
PULMONARY / CRITICAL CARE MEDICINE   Name: [name] MRN: [id number] DOB: 1945-12-05    ADMISSION DATE:  12/06/2017   HISTORY OF PRESENT ILLNESS:        This is a 72 year-old with a history of type 2 diabetes, chronic atrial fibrillation, and chronic kidney disease who was initially admitted on 3/29 complaining of dyspnea on exertion.  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  He has had a decline in his mental status and intermittent seizure activity and has required intubation and mechanical ventilation.  CT scan of the head has shown a right subdural hematoma without mass-effect.  An MRI which was obtained yesterday suggested some enhancement of the meninges therefore an LP was performed on 4/15.  There are no white cells in that fluid and the protein is 74.   He remains intubated and mechanically ventilated this morning.  He is currently sedated on 75 mcg of fentanyl and 0.6 of Precedex.  When awakened he is not interactive.  He was bradycardic yesterday and his sodium channel blocker was held.  SUBJECTIVE:  No events overnight, no new complaints  VITAL SIGNS: BP 131/70 (BP Location: Left Arm)   Pulse 80   Temp 97.6 F (36.4 C) (Axillary)   Resp 14   Ht 5\' 9"  (1.753 m)   Wt 257 lb 8 oz (116.8 kg)   SpO2 100%   BMI 38.03 kg/m   HEMODYNAMICS:    VENTILATOR SETTINGS: Vent Mode: PRVC FiO2 (%):  [40 %] 40 % Set Rate:  [14 bmp] 14 bmp Vt Set:  [570 mL] 570 mL PEEP:  [5 cmH20] 5 cmH20 Plateau Pressure:  [10 cmH20-15 cmH20] 10 cmH20  INTAKE / OUTPUT: I/O last 3 completed shifts: In: 2151.8 [I.V.:951.8; NG/GT:900; IV Piggyback:300] Out: 6270 [JJKKX:3818; Stool:685]  PHYSICAL EXAMINATION: General: Chronically ill appearing male, NAD, weaning Neuro: Not following commands but moving all ext to pain  Cardiac: RRR, Nl S1/S2 and -M/R/G Lungs: CTA bilaterally Abdomen: Soft, NT, ND and  +BS Musculoskeletal: -edema and -tenderness Skin: Intact  LABS:  BMET Recent Labs  Lab 01/12/18 0356 01/13/18 0421 01/14/18 0453  NA 151* 156* 158*  K 5.2* 4.6 4.5  CL 121* 127* 127*  CO2 23 23 24   BUN 82* 74* 71*  CREATININE 1.20 1.07 0.95  GLUCOSE 175* 177* 98   Electrolytes Recent Labs  Lab 01/09/18 0413  01/12/18 0356 01/13/18 0421 01/14/18 0453  CALCIUM 10.0   < > 11.2* 10.9* 11.2*  MG 1.9  --   --   --   --   PHOS 2.9  --   --   --   --    < > = values in this interval not displayed.   CBC Recent Labs  Lab 01/12/18 0356 01/13/18 0421 01/14/18 0453  WBC 9.3 8.8 10.7*  HGB 8.3* 8.3* 8.8*  HCT 27.4* 28.5* 30.0*  PLT 251 238 222   Coag's No results for input(s): APTT, INR in the last 168 hours.  Sepsis Markers Recent Labs  Lab 01/08/18 0421  PROCALCITON <0.10   ABG Recent Labs  Lab 01/10/18 0415 01/13/18 0437 01/14/18 0335  PHART 7.299* 7.324* 7.354  PCO2ART 44.2 49.0* 48.7*  PO2ART 116* 108 126*   Liver Enzymes Recent Labs  Lab 01/08/18 1110 01/13/18 0421  AST 19 22  ALT 72* 205*  ALKPHOS 75 95  BILITOT 0.9 0.7  ALBUMIN 2.0* 1.9*  Cardiac Enzymes Recent Labs  Lab 01/09/18 0413  TROPONINI 0.13*   Glucose Recent Labs  Lab 01/13/18 1607 01/13/18 2035 01/13/18 2355 01/14/18 0356 01/14/18 0358 01/14/18 0431  GLUCAP 150* 164* 163* 68 65 105*   Imaging No results found.  ANTIBIOTICS: As there are no white cells in the CSF I have discontinued his antibiotics on 4/15   DISCUSSION:     This is a 72 year old with type 2 diabetes and chronic atrial fibrillation who was initially admitted with dyspnea on exertion.  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  He subsequently has had intermittent seizures and depressed mental status.  ASSESSMENT / PLAN:  PULMONARY A: VDRF due to respiratory failure - Begin PS trials, no extubation until mental status is addressed - Titrate O2 for sat of  88-92%  CARDIOVASCULAR A: Atrial fibrillation is rate controlled, he is not a candidate for anticoagulation due to his subdural hematoma.  On exam he does not have overt evidence of CHF. - Tele monitoring - Rate control and anti-HTN as ordered  GASTROINTESTINAL - TF per nutrition  HEMATOLOGIC - CBC in AM - Transfuse per ICU protocol  ENDOCRINE A: Glycemic control is marginal I will make adjustments as required, thyrotoxicosis   - Methimazole - Solumedrol  NEUROLOGIC A: Remains delirious on precedex and fentanyl drip - D/C fentanyl - Precedex to minimum - Hold extubation til mental status is better  The patient is critically ill with multiple organ systems failure and requires high complexity decision making for assessment and support, frequent evaluation and titration of therapies, application of advanced monitoring technologies and extensive interpretation of multiple databases.   Critical Care Time devoted to patient care services described in this note is  35  Minutes. This time reflects time of care of this signee Dr Jennet Maduro. This critical care time does not reflect procedure time, or teaching time or supervisory time of PA/NP/Med student/Med Resident etc but could involve care discussion time.  Rush Farmer, M.D. [facility] Pulmonary/Critical Care Medicine. Pager: [phone]. After hours pager: [phone].  01/14/2018, 8:50 AM

## 2018-01-14 NOTE — Progress Notes (Addendum)
Nutrition Follow-up  DOCUMENTATION CODES:   Obesity unspecified  INTERVENTION:   -Vital High Protein @ 40 ml/hr (960 ml/day) -60 ml Prostat TID -MVI daily  Provides: 1560 kcal, 174 grams protein, and 806 ml free water.   NUTRITION DIAGNOSIS:   Inadequate oral intake related to acute illness as evidenced by NPO status.  Ongoing  GOAL:   Patient will meet greater than or equal to 90% of their needs  Meeting with tube feeding  MONITOR:   TF tolerance, Labs, Weight trends, Skin  REASON FOR ASSESSMENT:   Consult, Ventilator Enteral/tube feeding initiation and management  ASSESSMENT:    72 yo male admitted with acute respiratory failure with acute pulmonary edem and possible aspiration pneumonia; pt also with acute metabolic encephalopathy, bilateral subdural hematomas, seizures and thyroid storm. Pt with hx of CKD III, DM, adenocarcinoma of colon s/p resection and colostomy, renal cell carcinoma    4/2 - 4/4 intubated 4/9 re-intubated  Pt discussed during ICU rounds and with RN. Pt tolerating vent weaning today, but remains delirious. Awaiting return of mental status before extubation. RN reports pt tolerating current tube feeding regimen. Will adjust tube feeding as propofol has been discontinued.   Patient remains intubated on ventilator support MV: 6.9 L/min Temp (24hrs), Avg:98.6 F (37 C), Min:97.6 F (36.4 C), Max:99.3 F (37.4 C)  Weight noted to trend up 14 lb since last RD visit 4/10 (243 lb to 257 lb).   I/O: +2845 ml since admit UOP: 3614 ml x 24 hours  Medications reviewed and include: SSI, levemir, solu-medrol, liquid MVI, Precedex, fentanyl Labs reviewed: Na 158 (H) BUN 71 (H)   Diet Order:  Diet NPO time specified  EDUCATION NEEDS:   Not appropriate for education at this time  Skin:  Skin Assessment: Reviewed RN Assessment  Last BM:  420 ml x 24 hr via colostomy  Height:   Ht Readings from Last 1 Encounters:  01/06/18 5\' 9"  (1.753 m)     Weight:   Wt Readings from Last 1 Encounters:  01/14/18 257 lb 8 oz (116.8 kg)    Ideal Body Weight:  86 kg  BMI:  Body mass index is 38.03 kg/m.  Estimated Nutritional Needs:   Kcal:  6503-5465  Protein:  >/= 172 grams  Fluid:  >/= 2 L    Mariana Single RD, LDN Clinical Nutrition Pager # [phone]

## 2018-01-14 NOTE — Progress Notes (Signed)
Inpatient Diabetes Program Recommendations  AACE/ADA: New Consensus Statement on Inpatient Glycemic Control (2015)  Target Ranges:  Prepandial:   less than 140 mg/dL      Peak postprandial:   less than 180 mg/dL (1-2 hours)      Critically ill patients:  140 - 180 mg/dL   Lab Results  Component Value Date   GLUCAP 158 (H) 01/14/2018   HGBA1C 6.1 (H) 12/29/2017    Review of Glycemic Control Results for [name], [name] (MRN [id number]) as of 01/14/2018 14:03  Ref. Range 01/14/2018 03:58 01/14/2018 04:31 01/14/2018 08:18 01/14/2018 12:04  Glucose-Capillary Latest Ref Range: 65 - 99 mg/dL 65 105 (H) 152 (H) 158 (H)    Diabetes history:DM2 Outpatient Diabetes medications:Metformin 1000 mg BID, Actos 45 mg daily Current orders for Inpatient glycemic control:Novolog 3-9 units Q4H; Novolog 4 Q4H, Levemir 12 units Q12H, Solucortef 50 mg QD  Inpatient Diabetes Program Recommendations:   Noted hypoglycemic this AM of 65 mg/dL. May consider decreasing tube feed coverage to Novolog 3 units Q4H.  Thanks, Bronson Curb, MSN, RNC-OB Diabetes Coordinator [phone] (8a-5p)

## 2018-01-14 NOTE — Progress Notes (Signed)
1055 called Dr. Nelda Marseille regarding increase in agitation and increase in HR. Haldol given with no change. Orders to restart precedex and fentanyl.

## 2018-01-14 NOTE — Progress Notes (Addendum)
Neurology Progress Note   S:// No acute events overnight.  Off of propofol, continuing on Precedex for sedation.   O:// Current vital signs: BP 132/79   Pulse 83   Temp 98.3 F (36.8 C) (Axillary)   Resp 14   Ht '5\' 9"'$  (1.753 m)   Wt 116.8 kg (257 lb 8 oz)   SpO2 100%   BMI 38.03 kg/m  Vital signs in last 24 hours: Temp:  [98.1 F (36.7 C)-99.3 F (37.4 C)] 98.3 F (36.8 C) (04/17 0400) Pulse Rate:  [68-110] 83 (04/17 0700) Resp:  [11-28] 14 (04/17 0700) BP: (107-161)/(61-93) 132/79 (04/17 0700) SpO2:  [98 %-100 %] 100 % (04/17 0700) FiO2 (%):  [40 %] 40 % (04/17 0700) Weight:  [116.8 kg (257 lb 8 oz)] 116.8 kg (257 lb 8 oz) (04/17 0400) Examination not much different from yesterday, but much improved in terms of level of awareness from Monday. General: Well-developed well-nourished in no apparent distress, currently intubated, sedated on Precedex. HEENT: Endotracheal tube in place, normal cephalic, atraumatic Cardiovascular: S1-S2 heard, pulses are palpable throughout Respiratory: Clear to auscultation on vent Abdomen: Nondistended nontender Neurological exam Mental status: Patient sedated and intubated.  He can open his eyes to voice.  Does not follow any commands. Unable to assess speech due to intubation. Cranial nerves: Pupils 2 mm sluggishly reactive bilaterally, blinks to threat from both sides, ocular swellings present, corneals present bilaterally, unable to assess facial symmetry at this time due to intubation. Motor exam: Does not follow commands but on waking him up, becomes agitated and moves all 4 extremities spontaneously as well as tries to mouth words. Sensory exam: Moves all 4 extremities in response to noxious name as well Cerebellar exam could not assess due to patient's current mentation but based on the spontaneous movement did not appreciate any gross ataxia. DTRs are mute and plantars are mute bilaterally Gait examination cannot be performed due to  patient's current mentation  Medications  Current Facility-Administered Medications:  .  [DISCONTINUED] acetaminophen (TYLENOL) tablet 650 mg, 650 mg, Oral, Q6H PRN, 650 mg at 12/27/17 1959 **OR** acetaminophen (TYLENOL) suppository 650 mg, 650 mg, Rectal, Q6H PRN, Truett Mainland, DO, 650 mg at 01/08/18 1622 .  chlorhexidine gluconate (MEDLINE KIT) (PERIDEX) 0.12 % solution 15 mL, 15 mL, Mouth Rinse, BID, Tarry Kos, MD, 15 mL at 01/14/18 0806 .  Chlorhexidine Gluconate Cloth 2 % PADS 6 each, 6 each, Topical, Daily, Mannam, Praveen, MD, 6 each at 01/14/18 0400 .  dexmedetomidine (PRECEDEX) 400 MCG/100ML (4 mcg/mL) infusion, 0.4-1.2 mcg/kg/hr, Intravenous, Titrated, Mannam, Praveen, MD, Last Rate: 20 mL/hr at 01/14/18 0513, 0.7 mcg/kg/hr at 01/14/18 0513 .  dextrose 10 % infusion, , Intravenous, Continuous PRN, Chase Caller, Murali, MD .  diltiazem (CARDIZEM) tablet 60 mg, 60 mg, Oral, Q8H, Tarry Kos, MD, 60 mg at 01/14/18 8676 .  feeding supplement (PRO-STAT SUGAR FREE 64) liquid 60 mL, 60 mL, Per Tube, 5 X Daily, Tarry Kos, MD, 60 mL at 01/14/18 0614 .  feeding supplement (VITAL HIGH PROTEIN) liquid 1,000 mL, 1,000 mL, Per Tube, Continuous, Tarry Kos, MD, Last Rate: 15 mL/hr at 01/13/18 1709, 1,000 mL at 01/13/18 1709 .  fentaNYL 2561mg in NS 2523m(1079mml) infusion-PREMIX, 50-200 mcg/hr, Intravenous, Continuous, SmiMauri BrooklynD, Last Rate: 7.5 mL/hr at 01/13/18 2123, 75 mcg/hr at 01/13/18 2123 .  haloperidol lactate (HALDOL) injection 5 mg, 5 mg, Intravenous, Q6H PRN, ShaManuella Ghaziratik D, DO, 5 mg at 01/05/18 0307 .  hydrALAZINE (  APRESOLINE) injection 10 mg, 10 mg, Intravenous, Q6H PRN, Dhungel, Nishant, MD, 10 mg at 01/06/18 0709 .  insulin aspart (novoLOG) injection 3-9 Units, 3-9 Units, Subcutaneous, Q4H, Brand Males, MD, 6 Units at 01/14/18 0042 .  insulin aspart (novoLOG) injection 4 Units, 4 Units, Subcutaneous, Q4H, Brand Males, MD, 4  Units at 01/14/18 0042 .  insulin detemir (LEVEMIR) injection 12 Units, 12 Units, Subcutaneous, Q12H, Brand Males, MD, 12 Units at 01/13/18 2226 .  ipratropium-albuterol (DUONEB) 0.5-2.5 (3) MG/3ML nebulizer solution 3 mL, 3 mL, Nebulization, Q4H PRN, Tarry Kos, MD .  lactated ringers infusion, , Intravenous, Continuous, Tarry Kos, MD, Stopped at 01/07/18 1712 .  levETIRAcetam (KEPPRA) IVPB 1000 mg/100 mL premix, 1,000 mg, Intravenous, Q12H, Dhungel, Nishant, MD, Stopped at 01/14/18 0450 .  LORazepam (ATIVAN) injection 2 mg, 2 mg, Intravenous, Q4H PRN, Dhungel, Nishant, MD, 2 mg at 01/14/18 0252 .  MEDLINE mouth rinse, 15 mL, Mouth Rinse, 10 times per day, Mannam, Praveen, MD, 15 mL at 01/14/18 0600 .  methimazole (TAPAZOLE) tablet 20 mg, 20 mg, Per Tube, Q6H, Magdalen Spatz, NP, 20 mg at 01/14/18 0805 .  methylPREDNISolone sodium succinate (SOLU-MEDROL) 40 mg/mL injection 40 mg, 40 mg, Intravenous, Q12H, Tarry Kos, MD, 40 mg at 01/13/18 2227 .  multivitamin liquid 15 mL, 15 mL, Per Tube, Daily, Tarry Kos, MD, 15 mL at 01/13/18 0909 .  ondansetron (ZOFRAN) tablet 4 mg, 4 mg, Oral, Q6H PRN **OR** ondansetron (ZOFRAN) injection 4 mg, 4 mg, Intravenous, Q6H PRN, Truett Mainland, DO .  pantoprazole sodium (PROTONIX) 40 mg/20 mL oral suspension 40 mg, 40 mg, Per Tube, QHS, Susa Raring, RPH, 40 mg at 01/13/18 2232 .  propofol (DIPRIVAN) 1000 MG/100ML infusion, 5-80 mcg/kg/min, Intravenous, Titrated, Tarry Kos, MD, Last Rate: 13.2 mL/hr at 01/10/18 0700, 19.928 mcg/kg/min at 01/10/18 0700 .  sodium chloride flush (NS) 0.9 % injection 10-40 mL, 10-40 mL, Intracatheter, Q12H, Dhungel, Nishant, MD, 10 mL at 01/13/18 0910 .  sodium chloride flush (NS) 0.9 % injection 10-40 mL, 10-40 mL, Intracatheter, PRN, Dhungel, Nishant, MD, 10 mL at 12/29/17 2156 Labs CBC    Component Value Date/Time   WBC 10.7 (H) 01/14/2018 0453   RBC 3.35 (L)  01/14/2018 0453   HGB 8.8 (L) 01/14/2018 0453   HCT 30.0 (L) 01/14/2018 0453   PLT 222 01/14/2018 0453   MCV 89.6 01/14/2018 0453   MCH 26.3 01/14/2018 0453   MCHC 29.3 (L) 01/14/2018 0453   RDW 17.3 (H) 01/14/2018 0453   LYMPHSABS 0.5 (L) 01/14/2018 0453   MONOABS 0.2 01/14/2018 0453   EOSABS 0.0 01/14/2018 0453   BASOSABS 0.0 01/14/2018 0453    CMP     Component Value Date/Time   NA 158 (H) 01/14/2018 0453   K 4.5 01/14/2018 0453   CL 127 (H) 01/14/2018 0453   CO2 24 01/14/2018 0453   GLUCOSE 98 01/14/2018 0453   BUN 71 (H) 01/14/2018 0453   CREATININE 0.95 01/14/2018 0453   CALCIUM 11.2 (H) 01/14/2018 0453   PROT 5.1 (L) 01/13/2018 0421   ALBUMIN 1.9 (L) 01/13/2018 0421   AST 22 01/13/2018 0421   ALT 205 (H) 01/13/2018 0421   ALKPHOS 95 01/13/2018 0421   BILITOT 0.7 01/13/2018 0421   GFRNONAA >60 01/14/2018 0453   GFRAA >60 01/14/2018 0453  CSF cytology does not reveal any malignant cells.  Ammonia 18 Most recent EEG on 01/08/2018 showed moderate to diffuse background slowing with no evidence  of clinical seizure or propensity for seizures.  Imaging I have reviewed images in epic and the results pertinent to this consultation are: I have reviewed images in epic and the results pertinent to this consultation are: CT-scan of the brain-01/07/2018-right subdural hematoma, compared to 01/03/2018 is stable. No ICH. No mass-effect. MRI examination of the brain-12/29/17 and 01/10/18 shows acute to subacute right holohemispheric subdural hematoma with residual Falco tentorial subdural hematoma and near complete resolution of the left subdural hematoma. There is also extensive increased signal in the subarachnoid space on the right side on the FLAIR image suggestive of proteinaceous fluid-? Blood,? Carcinomatous meningitis? Other malignant/effusion processes.  Assessment:  72 year old man, past history of atrial fibrillation, stage III chronic kidney disease, diabetes, colon  cancer status post resection and current colostomy, history of renal cell cancer status post resection, admitted 12/12/2017 for shortness of breath and supratherapeutic INR, A. fib with RVR and pulmonary edema. Had multiple intubations and extubations for his respiratory status waxing and waning. Also noted to have thyrotoxicosis. Head imaging revealed left-sided subdural hematoma that was resolving and a new right-sided subdural fluid collection with some subarachnoid component. Neurology consultation obtained for encephalopathy. Spinal tap obtained.  Not consistent with infection.  Cytology did not reveal any malignant cells His clinical exam is improving off of sedation although he still is on Precedex.  He is moving all 4 extremities, and is easy to arouse but is still not following any commands. I would attribute his current symptoms to his current multifactorial toxic metabolic encephalopathy secondary to bilateral subdural hematomas and thyrotoxicosis. I would expect his mental status to improve slowly over time given the bilateral nature of the subdurals.  Thyrotoxicosis and other toxic metabolic derangements would also slow the speed of his recovery.  Recommendations: -Continue with Keppra for seizure prophylaxis as his initial presentation was with seizures and he still has subdural hematomas. -Management of toxic metabolic derangements per primary team as you are including that of thyrotoxicosis -I do not see a role for steroids as his mental status is improving and I do not think that there is any underlying autoimmune process contributing to his altered mental status this time. -Supportive treatment per primary team as you are. -Continue frequent neurochecks.  Neurology service will be available as needed.  Please call us with questions  -- Amie Portland, MD [facility] Pager: [phone] If 7pm to 7am, please call on call as listed on AMION.  CRITICAL CARE  ATTESTATION This patient is critically ill and at significant risk of neurological worsening, death and care requires constant monitoring of vital signs, hemodynamics,respiratory and cardiac monitoring. I spent 30  minutes of neurocritical care time performing neurological assessment, discussion with family, other specialists and medical decision making of high complexity in the care of  this patient.

## 2018-01-15 ENCOUNTER — Inpatient Hospital Stay (HOSPITAL_COMMUNITY): Payer: Medicare Other

## 2018-01-15 DIAGNOSIS — I4891 Unspecified atrial fibrillation: Secondary | ICD-10-CM

## 2018-01-15 DIAGNOSIS — J9601 Acute respiratory failure with hypoxia: Secondary | ICD-10-CM

## 2018-01-15 LAB — CBC WITH DIFFERENTIAL/PLATELET
BASOS PCT: 0 %
Basophils Absolute: 0 10*3/uL (ref 0.0–0.1)
EOS ABS: 0 10*3/uL (ref 0.0–0.7)
Eosinophils Relative: 0 %
HCT: 30.5 % — ABNORMAL LOW (ref 39.0–52.0)
HEMOGLOBIN: 9 g/dL — AB (ref 13.0–17.0)
Lymphocytes Relative: 3 %
Lymphs Abs: 0.4 10*3/uL — ABNORMAL LOW (ref 0.7–4.0)
MCH: 26.6 pg (ref 26.0–34.0)
MCHC: 29.5 g/dL — AB (ref 30.0–36.0)
MCV: 90.2 fL (ref 78.0–100.0)
Monocytes Absolute: 0.1 10*3/uL (ref 0.1–1.0)
Monocytes Relative: 1 %
NEUTROS PCT: 96 %
Neutro Abs: 11.4 10*3/uL — ABNORMAL HIGH (ref 1.7–7.7)
PLATELETS: 174 10*3/uL (ref 150–400)
RBC: 3.38 MIL/uL — AB (ref 4.22–5.81)
RDW: 17.5 % — ABNORMAL HIGH (ref 11.5–15.5)
WBC: 11.9 10*3/uL — AB (ref 4.0–10.5)

## 2018-01-15 LAB — HEPATIC FUNCTION PANEL
ALT: 144 U/L — AB (ref 17–63)
AST: 18 U/L (ref 15–41)
Albumin: 1.9 g/dL — ABNORMAL LOW (ref 3.5–5.0)
Alkaline Phosphatase: 86 U/L (ref 38–126)
BILIRUBIN INDIRECT: 0.5 mg/dL (ref 0.3–0.9)
Bilirubin, Direct: 0.2 mg/dL (ref 0.1–0.5)
Total Bilirubin: 0.7 mg/dL (ref 0.3–1.2)
Total Protein: 4.8 g/dL — ABNORMAL LOW (ref 6.5–8.1)

## 2018-01-15 LAB — MAGNESIUM: MAGNESIUM: 1.9 mg/dL (ref 1.7–2.4)

## 2018-01-15 LAB — CK: CK TOTAL: 21 U/L — AB (ref 49–397)

## 2018-01-15 LAB — BLOOD GAS, ARTERIAL
Acid-Base Excess: 2.2 mmol/L — ABNORMAL HIGH (ref 0.0–2.0)
Bicarbonate: 27 mmol/L (ref 20.0–28.0)
DRAWN BY: 44135
FIO2: 40
MECHVT: 570 mL
O2 SAT: 98.5 %
PATIENT TEMPERATURE: 99.4
PCO2 ART: 48.5 mmHg — AB (ref 32.0–48.0)
PEEP: 5 cmH2O
PH ART: 7.366 (ref 7.350–7.450)
PO2 ART: 116 mmHg — AB (ref 83.0–108.0)
RATE: 14 resp/min

## 2018-01-15 LAB — GLUCOSE, CAPILLARY
GLUCOSE-CAPILLARY: 194 mg/dL — AB (ref 65–99)
GLUCOSE-CAPILLARY: 213 mg/dL — AB (ref 65–99)
Glucose-Capillary: 189 mg/dL — ABNORMAL HIGH (ref 65–99)
Glucose-Capillary: 206 mg/dL — ABNORMAL HIGH (ref 65–99)
Glucose-Capillary: 220 mg/dL — ABNORMAL HIGH (ref 65–99)
Glucose-Capillary: 244 mg/dL — ABNORMAL HIGH (ref 65–99)

## 2018-01-15 LAB — BASIC METABOLIC PANEL
ANION GAP: 3 — AB (ref 5–15)
BUN: 65 mg/dL — ABNORMAL HIGH (ref 6–20)
CHLORIDE: 122 mmol/L — AB (ref 101–111)
CO2: 26 mmol/L (ref 22–32)
Calcium: 10.4 mg/dL — ABNORMAL HIGH (ref 8.9–10.3)
Creatinine, Ser: 1.02 mg/dL (ref 0.61–1.24)
GFR calc non Af Amer: >60 mL/min (ref >60)
Glucose, Bld: 268 mg/dL — ABNORMAL HIGH (ref 65–99)
POTASSIUM: 4.7 mmol/L (ref 3.5–5.1)
SODIUM: 151 mmol/L — AB (ref 135–145)

## 2018-01-15 LAB — CSF CULTURE W GRAM STAIN

## 2018-01-15 LAB — AMMONIA: Ammonia: 16 umol/L (ref 9–35)

## 2018-01-15 LAB — PROTIME-INR
INR: 1.33
Prothrombin Time: 16.4 seconds — ABNORMAL HIGH (ref 11.4–15.2)

## 2018-01-15 LAB — PHOSPHORUS: Phosphorus: 3.3 mg/dL (ref 2.5–4.6)

## 2018-01-15 LAB — T4, FREE: FREE T4: 0.93 ng/dL (ref 0.61–1.12)

## 2018-01-15 LAB — CSF CULTURE: CULTURE: NO GROWTH

## 2018-01-15 MED ORDER — FUROSEMIDE 10 MG/ML IJ SOLN
20.0000 mg | Freq: Once | INTRAMUSCULAR | Status: AC
Start: 2018-01-15 — End: 2018-01-15
  Administered 2018-01-15: 20 mg via INTRAVENOUS
  Filled 2018-01-15: qty 2

## 2018-01-15 MED ORDER — INSULIN DETEMIR 100 UNIT/ML ~~LOC~~ SOLN
15.0000 [IU] | Freq: Two times a day (BID) | SUBCUTANEOUS | Status: DC
  Administered 2018-01-15 – 2018-01-17 (×5): 15 [IU] via SUBCUTANEOUS
  Filled 2018-01-15 (×6): qty 0.15

## 2018-01-15 MED ORDER — IPRATROPIUM-ALBUTEROL 0.5-2.5 (3) MG/3ML IN SOLN
3.0000 mL | Freq: Four times a day (QID) | RESPIRATORY_TRACT | Status: DC
Start: 2018-01-15 — End: 2018-01-20
  Administered 2018-01-15 – 2018-01-19 (×18): 3 mL via RESPIRATORY_TRACT
  Filled 2018-01-15 (×18): qty 3

## 2018-01-15 MED ORDER — METHYLPREDNISOLONE SODIUM SUCC 40 MG IJ SOLR
20.0000 mg | Freq: Two times a day (BID) | INTRAMUSCULAR | Status: DC
  Administered 2018-01-15 – 2018-01-19 (×9): 20 mg via INTRAVENOUS
  Filled 2018-01-15 (×10): qty 1

## 2018-01-15 NOTE — Progress Notes (Signed)
PT Cancellation Note  Patient Details Name: [name] MRN: [id number] DOB: [birth year]   Cancelled Treatment:    Reason Eval/Treat Not Completed: Patient not medically ready; still intubated and sedated.  Will attempt another day.   Reginia Naas 01/15/2018, 2:10 PM  Magda Kiel, [place] 01/15/2018

## 2018-01-15 NOTE — Progress Notes (Signed)
PULMONARY / CRITICAL CARE MEDICINE   Name: [name] MRN: [id number] DOB: 31-Jul-1946    ADMISSION DATE:  12/17/2017   DISCUSSION:     This is a 72 year old with type 2 diabetes and chronic atrial fibrillation who was initially admitted with dyspnea on exertion.  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  He subsequently has had intermittent seizures and depressed mental status.  ASSESSMENT / PLAN:  PULMONARY A: VDRF due to respiratory failure - Begin PS trials, no extubation until agitates is improved, will continue precedex for now and try off fentanyl - Titrate O2 for sat of 88-92% - If doing well, will consider SBT and evaluate further< Off fentanyl still not trriggering vent much>  - Add BD  As some wheezing  CARDIOVASCULAR A: Atrial fibrillation is rate controlled, he is not a candidate for anticoagulation due to his subdural hematoma.  On exam he does not have overt evidence of CHF. - Tele monitoring - Rate control and anti-HTN as ordered -Currently on Cardizem< no further bradycardia noted> if evidence of further bradycardia Precex might need to be stopped   GASTROINTESTINAL - TF per nutrition - Mild elevation of LFT monitor - Ammonia was ok  HEMATOLOGIC - CBC in AM - Transfuse per ICU protocol  ENDOCRINE A: Glycemic control is marginal I will make adjustments as required, thyrotoxicosis   - Methimazole- RPT TSH is normal - Solumedrol - Decrease steroids dose and increase lantus due to elevated blood sugars  NEUROLOGIC A: Remains delirious on precedex and fentanyl drip - D/C fentanyl - Precedex to minimum - Hold extubation til mental status is better  Renal: No acute issue monitor Cr ID no acute issue off antibiotics once LP was negative   Skin/Wound: Chronic changes   Electrolytes: Replace electrolytes per ICU electrolyte replacement protocol.   IVF: none  Nutrition: TF per goal  Prophylaxis: DVT Prophylaxis with heparin if ok by  neurology ,. GI Prophylaxis.   Restraints: Mittens   PT/OT eval and treat. OOB when appropriate.   Lines/Tubes:  01/03/18 foley  Right arm Picc 12/27/17  ADVANCE DIRECTIVE:Full code  FAMILY DISCUSSION:spoke with wife at bed side  Quality Care: PPI, DVT prophylaxis, HOB elevated, Infection control all reviewed and addressed.  Events and notes from last 24 hours reviewed. Care plan discussed on multidisciplinary rounds  CC TIME:40 min    Old records reviewed discussed results and management plan with patient  Images personally reviewed and results and labs reviewed and discussed with patient.  All medication reviewed and adjusted  Further management depending on test results and work up as outlined above.     HISTORY OF PRESENT ILLNESS:        This is a 72 year-old with a history of type 2 diabetes, chronic atrial fibrillation, and chronic kidney disease who was initially admitted on 3/29 complaining of dyspnea on exertion.  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  He has had a decline in his mental status and intermittent seizure activity and has required intubation and mechanical ventilation.  CT scan of the head has shown a right subdural hematoma without mass-effect.  An MRI which was obtained yesterday suggested some enhancement of the meninges therefore an LP was performed on 4/15.  There are no white cells in that fluid and the protein is 74.   He remains intubated and mechanically ventilated this morning.  He is currently sedated on  75 mcg of fentanyl and 0.6 of Precedex.  When awakened he is not interactive.  He was bradycardic yesterday and his sodium channel blocker was held.  01/15/18 No major overnight event Still on and off agitated requiring sedation Neurology following suggest possibility of toxic encephalopathy LP was ok not suggestive of infection, Elevated BS noted   SUBJECTIVE:   No events overnight, no new complaints  VITAL SIGNS: BP 135/84   Pulse 85   Temp 98.3 F (36.8 C) (Axillary)   Resp 14   Ht 5\' 9"  (1.753 m)   Wt 116.8 kg (257 lb 8 oz)   SpO2 100%   BMI 38.03 kg/m   HEMODYNAMICS:    VENTILATOR SETTINGS: Vent Mode: PRVC FiO2 (%):  [40 %] 40 % Set Rate:  [14 bmp] 14 bmp Vt Set:  [570 mL] 570 mL PEEP:  [5 cmH20] 5 cmH20 Pressure Support:  [5 cmH20] 5 cmH20 Plateau Pressure:  [11 cmH20-15 cmH20] 11 cmH20  INTAKE / OUTPUT: I/O last 3 completed shifts: In: 4170.3 [I.V.:2429.1; NG/GT:1441.3; IV Piggyback:300] Out: 6197 [Urine:5587; Stool:610]  PHYSICAL EXAMINATION: General: Chronically ill appearing male, NAD, weaning Neuro: Not following commands but moving all ext to pain  Cardiac: RRR, Nl S1/S2 and -M/R/G Lungs: CTA bilaterally Abdomen: Soft, NT, ND and +BS Musculoskeletal: -edema and -tenderness Skin: Intact  LABS:  BMET Recent Labs  Lab 01/13/18 0421 01/14/18 0453 01/15/18 0439  NA 156* 158* 151*  K 4.6 4.5 4.7  CL 127* 127* 122*  CO2 23 24 26   BUN 74* 71* 65*  CREATININE 1.07 0.95 1.02  GLUCOSE 177* 98 268*   Electrolytes Recent Labs  Lab 01/09/18 0413  01/13/18 0421 01/14/18 0453 01/15/18 0439  CALCIUM 10.0   < > 10.9* 11.2* 10.4*  MG 1.9  --   --   --  1.9  PHOS 2.9  --   --   --  3.3   < > = values in this interval not displayed.   CBC Recent Labs  Lab 01/13/18 0421 01/14/18 0453 01/15/18 0439  WBC 8.8 10.7* 11.9*  HGB 8.3* 8.8* 9.0*  HCT 28.5* 30.0* 30.5*  PLT 238 222 174   Coag's Recent Labs  Lab 01/15/18 0439  INR 1.33    Sepsis Markers No results for input(s): LATICACIDVEN, PROCALCITON, O2SATVEN in the last 168 hours. ABG Recent Labs  Lab 01/13/18 0437 01/14/18 0335 01/15/18 0405  PHART 7.324* 7.354 7.366  PCO2ART 49.0* 48.7* 48.5*  PO2ART 108 126* 116*   Liver Enzymes Recent Labs  Lab 01/08/18 1110 01/13/18 0421 01/15/18 0439  AST 19 22 18   ALT 72* 205* 144*  ALKPHOS 75  95 86  BILITOT 0.9 0.7 0.7  ALBUMIN 2.0* 1.9* 1.9*   Cardiac Enzymes Recent Labs  Lab 01/09/18 0413  TROPONINI 0.13*   Glucose Recent Labs  Lab 01/14/18 1204 01/14/18 1538 01/14/18 2011 01/14/18 2347 01/15/18 0356 01/15/18 0723  GLUCAP 158* 164* 168* 173* 194* 213*   Imaging Dg Chest Port 1 View  Result Date: 01/15/2018 CLINICAL DATA:  ET tube EXAM: PORTABLE CHEST 1 VIEW COMPARISON:  01/13/2018 FINDINGS: Support devices are stable. Cardiomegaly. Bibasilar atelectasis or infiltrates, left greater than right with small left effusion likely present. No real change since prior study. No acute bony abnormality. IMPRESSION: Bibasilar atelectasis or infiltrates, similar prior study. Possible small left effusion. No real change. Electronically Signed   By: Rolm Baptise M.D.   On: 01/15/2018 09:14    ANTIBIOTICS: As there are no  white cells in the CSF I have discontinued his antibiotics on 4/15       Lahoma Rocker, M.D

## 2018-01-15 NOTE — Progress Notes (Signed)
Palliative:  Please reconsult when family more open to palliative discussion. Per notes still awaiting improvement in mental status. Will sign off for now.   No charge  Vinie Sill, NP Palliative Medicine Team Pager # [phone] (M-F 8a-5p) Team Phone # [phone] (Nights/Weekends)

## 2018-01-16 ENCOUNTER — Inpatient Hospital Stay (HOSPITAL_COMMUNITY): Payer: Medicare Other

## 2018-01-16 DIAGNOSIS — J9601 Acute respiratory failure with hypoxia: Secondary | ICD-10-CM

## 2018-01-16 DIAGNOSIS — I4891 Unspecified atrial fibrillation: Secondary | ICD-10-CM

## 2018-01-16 LAB — GLUCOSE, CAPILLARY
GLUCOSE-CAPILLARY: 160 mg/dL — AB (ref 65–99)
GLUCOSE-CAPILLARY: 255 mg/dL — AB (ref 65–99)
GLUCOSE-CAPILLARY: 247 mg/dL — AB (ref 65–99)
GLUCOSE-CAPILLARY: 242 mg/dL — AB (ref 65–99)
Glucose-Capillary: 140 mg/dL — ABNORMAL HIGH (ref 65–99)
Glucose-Capillary: 224 mg/dL — ABNORMAL HIGH (ref 65–99)

## 2018-01-16 LAB — BLOOD GAS, ARTERIAL
Acid-Base Excess: 1.6 mmol/L (ref 0.0–2.0)
Bicarbonate: 26.3 mmol/L (ref 20.0–28.0)
Drawn by: 41422
FIO2: 40
O2 Saturation: 98.5 %
PEEP: 5 cmH2O
Patient temperature: 98.2
RATE: 14 resp/min
VT: 570 mL
pCO2 arterial: 46.1 mmHg (ref 32.0–48.0)
pH, Arterial: 7.374 (ref 7.350–7.450)
pO2, Arterial: 115 mmHg — ABNORMAL HIGH (ref 83.0–108.0)

## 2018-01-16 LAB — COMPREHENSIVE METABOLIC PANEL
ALBUMIN: 1.8 g/dL — AB (ref 3.5–5.0)
ALK PHOS: 88 U/L (ref 38–126)
ALT: 187 U/L — AB (ref 17–63)
AST: 47 U/L — ABNORMAL HIGH (ref 15–41)
Anion gap: 6 (ref 5–15)
BUN: 54 mg/dL — ABNORMAL HIGH (ref 6–20)
CALCIUM: 10.2 mg/dL (ref 8.9–10.3)
CHLORIDE: 118 mmol/L — AB (ref 101–111)
CO2: 25 mmol/L (ref 22–32)
CREATININE: 0.82 mg/dL (ref 0.61–1.24)
GFR calc Af Amer: >60 mL/min (ref >60)
GFR calc non Af Amer: >60 mL/min (ref >60)
GLUCOSE: 164 mg/dL — AB (ref 65–99)
Potassium: 4.1 mmol/L (ref 3.5–5.1)
SODIUM: 149 mmol/L — AB (ref 135–145)
Total Bilirubin: 0.7 mg/dL (ref 0.3–1.2)
Total Protein: 4.9 g/dL — ABNORMAL LOW (ref 6.5–8.1)

## 2018-01-16 LAB — PHOSPHORUS: Phosphorus: 3.7 mg/dL (ref 2.5–4.6)

## 2018-01-16 LAB — AMMONIA: AMMONIA: 12 umol/L (ref 9–35)

## 2018-01-16 LAB — MAGNESIUM: Magnesium: 1.8 mg/dL (ref 1.7–2.4)

## 2018-01-16 MED ORDER — MAGNESIUM SULFATE 2 GM/50ML IV SOLN
2.0000 g | Freq: Once | INTRAVENOUS | Status: AC
  Administered 2018-01-16: 2 g via INTRAVENOUS

## 2018-01-16 MED ORDER — MAGNESIUM SULFATE 2 GM/50ML IV SOLN
INTRAVENOUS | Status: AC
  Filled 2018-01-16: qty 50

## 2018-01-16 MED ORDER — FUROSEMIDE 10 MG/ML IJ SOLN
20.0000 mg | Freq: Once | INTRAMUSCULAR | Status: AC
  Administered 2018-01-16: 20 mg via INTRAVENOUS
  Filled 2018-01-16: qty 2

## 2018-01-16 NOTE — Progress Notes (Signed)
[facility] ADULT ICU REPLACEMENT PROTOCOL FOR AM LAB REPLACEMENT ONLY  The patient does apply for the [facility] Adult ICU Electrolyte Replacment Protocol based on the criteria listed below:   1. Is GFR >/= 40 ml/min? Yes.    Patient's GFR today is >60 2. Is urine output >/= 0.5 ml/kg/hr for the last 6 hours? Yes.   Patient's UOP is .6 ml/kg/hr 3. Is BUN < 60 mg/dL? Yes.    Patient's BUN today is 54 4. Abnormal electrolyte(s):  Mg- 1.8 5. Ordered repletion with: per protocol 6. If a panic level lab has been reported, has the CCM MD in charge been notified? Yes.  .   Physician:  Dr. Jonetta Speak, Philis Nettle 01/16/2018 6:38 AM

## 2018-01-16 NOTE — Progress Notes (Signed)
PULMONARY / CRITICAL CARE MEDICINE   Name: [name] MRN: [id number] DOB: 15-Jul-1946    ADMISSION DATE:  12/06/2017   DISCUSSION:     This is a 72 year old with type 2 diabetes and chronic atrial fibrillation who was initially admitted with dyspnea on exertion.  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  He subsequently has had intermittent seizures and depressed mental status.  ASSESSMENT / PLAN:  PULMONARY A: VDRF due to respiratory failure - Begin PS trials, no extubation until agitates is improved, will continue precedex for now and try off fentanyl - Titrate O2 for sat of 88-92% - If doing well, will consider SBT and evaluate further - On BD  As some wheezing  CARDIOVASCULAR A: Atrial fibrillation is rate controlled, he is not a candidate for anticoagulation due to his subdural hematoma.   - Tele monitoring - Rate control and anti-HTN as ordered - Give one dose lasix as some rales on exam noted, CXR is ok  -Currently on Cardizem< no further bradycardia noted> if evidence of further bradycardia Precex might need to be stopped   GASTROINTESTINAL - TF per nutrition - Mild elevation of LFT monitor - Ammonia was ok  HEMATOLOGIC - CBC in AM - Transfuse per ICU protocol  ENDOCRINE A: Glycemic control is marginal I will make adjustments as required, thyrotoxicosis   - Methimazole- RPT TSH is normal - Solumedrol - Decrease steroids dose and  - BS better with adjustment of lantus dose  NEUROLOGIC A: Remains delirious on precedex and fentanyl drip - D/C fentanyl - Precedex to minimum - Hold extubation til mental status is better  Renal: No acute issue monitor Cr, NA is improving on D5 ID no acute issue off antibiotics once LP was negative< Coag negative staph was deemed contaminant>    Skin/Wound: Chronic changes   Electrolytes: Replace electrolytes per ICU electrolyte replacement protocol.   IVF: none  Nutrition: TF per goal  Prophylaxis: DVT  Prophylaxis with heparin if ok by neurology ,. GI Prophylaxis.   Restraints: Mittens, wrist restrain  PT/OT eval and treat. OOB when appropriate.   Lines/Tubes:  01/03/18 foley  Right arm Picc 12/27/17  ADVANCE DIRECTIVE:Full code  FAMILY DISCUSSION:spoke with wife at bed side on 4/18  Quality Care: PPI, DVT prophylaxis, HOB elevated, Infection control all reviewed and addressed.  Events and notes from last 24 hours reviewed. Care plan discussed on multidisciplinary rounds  CC TIME: 32 min    Old records reviewed discussed results and management plan with patient  Images personally reviewed and results and labs reviewed and discussed with patient.  All medication reviewed and adjusted  Further management depending on test results and work up as outlined above.     HISTORY OF PRESENT ILLNESS:        This is a 72 year-old with a history of type 2 diabetes, chronic atrial fibrillation, and chronic kidney disease who was initially admitted on 3/29 complaining of dyspnea on exertion.  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  He has had a decline in his mental status and intermittent seizure activity and has required intubation and mechanical ventilation.  CT scan of the head has shown a right subdural hematoma without mass-effect.  An MRI which was obtained yesterday suggested some enhancement of the meninges therefore an LP was performed on 4/15.  There are no white cells in that fluid and the protein is 74.  He remains intubated and mechanically ventilated this morning.  He is currently sedated on 75 mcg of fentanyl and 0.6 of Precedex.  When awakened he is not interactive.  He was bradycardic yesterday and his sodium channel blocker was held.  01/16/18 No major overnight event Currently sedated on 100 fentanyl and 1 of precedex Neurology following suggest possibility of toxic encephalopathy LP was ok  not suggestive of infection, BS improved  SIGNIFICANT EVENTS  Intubation 4/2  STUDIES:  EEG 4/7, 4/11 LP 4/15 Blood culture 1:2 Coag negative staph -- all other negative  MRI Head: 4/13: 1. Similar acute to subacute RIGHT holohemispheric subdural hematoma. Trace residual falcotentorial subdural hematoma. Near complete resolution of LEFT subdural hematoma. ANTIBIOTICS: None     SUBJECTIVE:  No events overnight, no new complaints  VITAL SIGNS: BP 130/71   Pulse 99   Temp 97.6 F (36.4 C) (Axillary)   Resp 14   Ht 5\' 9"  (1.753 m)   Wt 119.3 kg (263 lb 0.1 oz)   SpO2 100%   BMI 38.84 kg/m   HEMODYNAMICS:    VENTILATOR SETTINGS: Vent Mode: PRVC FiO2 (%):  [40 %] 40 % Set Rate:  [14 bmp] 14 bmp Vt Set:  [570 mL] 570 mL PEEP:  [5 cmH20] 5 cmH20 Pressure Support:  [10 cmH20] 10 cmH20 Plateau Pressure:  [9 cmH20-14 cmH20] 13 cmH20  INTAKE / OUTPUT: I/O last 3 completed shifts: In: 6759.7 [I.V.:3925.7; DZ/HG:9924; IV Piggyback:350] Out: 2683 [MHDQQ:2297; Stool:250]  PHYSICAL EXAMINATION: General: Chronically ill appearing male, NAD, weaning Neuro: Not following commands but moving all ext to pain  Cardiac: RRR, Nl S1/S2 and -M/R/G Lungs: CTA bilaterally, Minimal wheezing Abdomen: Soft, NT, ND and +BS Musculoskeletal: -edema and -tenderness Skin: Intact  LABS:  BMET Recent Labs  Lab 01/14/18 0453 01/15/18 0439 01/16/18 0532  NA 158* 151* 149*  K 4.5 4.7 4.1  CL 127* 122* 118*  CO2 24 26 25   BUN 71* 65* 54*  CREATININE 0.95 1.02 0.82  GLUCOSE 98 268* 164*   Electrolytes Recent Labs  Lab 01/14/18 0453 01/15/18 0439 01/16/18 0532  CALCIUM 11.2* 10.4* 10.2  MG  --  1.9 1.8  PHOS  --  3.3 3.7   CBC Recent Labs  Lab 01/13/18 0421 01/14/18 0453 01/15/18 0439  WBC 8.8 10.7* 11.9*  HGB 8.3* 8.8* 9.0*  HCT 28.5* 30.0* 30.5*  PLT 238 222 174   Coag's Recent Labs  Lab 01/15/18 0439  INR 1.33    Sepsis Markers No results for input(s):  LATICACIDVEN, PROCALCITON, O2SATVEN in the last 168 hours. ABG Recent Labs  Lab 01/14/18 0335 01/15/18 0405 01/16/18 0333  PHART 7.354 7.366 7.374  PCO2ART 48.7* 48.5* 46.1  PO2ART 126* 116* 115*   Liver Enzymes Recent Labs  Lab 01/13/18 0421 01/15/18 0439 01/16/18 0532  AST 22 18 47*  ALT 205* 144* 187*  ALKPHOS 95 86 88  BILITOT 0.7 0.7 0.7  ALBUMIN 1.9* 1.9* 1.8*   Cardiac Enzymes No results for input(s): TROPONINI, PROBNP in the last 168 hours. Glucose Recent Labs  Lab 01/15/18 1331 01/15/18 1554 01/15/18 2016 01/15/18 2354 01/16/18 0404 01/16/18 0746  GLUCAP 189* 244* 220* 206* 160* 140*   Imaging No results found.  ANTIBIOTICS: As there are no white cells in the CSF I have discontinued his antibiotics on 4/15       Lahoma Rocker, M.D

## 2018-01-17 ENCOUNTER — Inpatient Hospital Stay (HOSPITAL_COMMUNITY): Payer: Medicare Other

## 2018-01-17 LAB — HEPATIC FUNCTION PANEL
ALBUMIN: 1.9 g/dL — AB (ref 3.5–5.0)
ALT: 186 U/L — ABNORMAL HIGH (ref 17–63)
AST: 24 U/L (ref 15–41)
Alkaline Phosphatase: 84 U/L (ref 38–126)
BILIRUBIN DIRECT: 0.2 mg/dL (ref 0.1–0.5)
BILIRUBIN TOTAL: 0.7 mg/dL (ref 0.3–1.2)
Indirect Bilirubin: 0.5 mg/dL (ref 0.3–0.9)
Total Protein: 4.8 g/dL — ABNORMAL LOW (ref 6.5–8.1)

## 2018-01-17 LAB — BASIC METABOLIC PANEL
ANION GAP: 7 (ref 5–15)
Anion gap: 5 (ref 5–15)
BUN: 56 mg/dL — ABNORMAL HIGH (ref 6–20)
BUN: 60 mg/dL — ABNORMAL HIGH (ref 6–20)
CALCIUM: 9.6 mg/dL (ref 8.9–10.3)
CO2: 25 mmol/L (ref 22–32)
CO2: 24 mmol/L (ref 22–32)
CREATININE: 0.87 mg/dL (ref 0.61–1.24)
Calcium: 9.4 mg/dL (ref 8.9–10.3)
Chloride: 110 mmol/L (ref 101–111)
Chloride: 105 mmol/L (ref 101–111)
Creatinine, Ser: 0.95 mg/dL (ref 0.61–1.24)
GFR calc Af Amer: >60 mL/min (ref >60)
GFR calc non Af Amer: >60 mL/min (ref >60)
GFR calc non Af Amer: >60 mL/min (ref >60)
GLUCOSE: 238 mg/dL — AB (ref 65–99)
Glucose, Bld: 207 mg/dL — ABNORMAL HIGH (ref 65–99)
POTASSIUM: 3.8 mmol/L (ref 3.5–5.1)
Potassium: 4.5 mmol/L (ref 3.5–5.1)
Sodium: 140 mmol/L (ref 135–145)
Sodium: 136 mmol/L (ref 135–145)

## 2018-01-17 LAB — CBC WITH DIFFERENTIAL/PLATELET
BASOS PCT: 0 %
Basophils Absolute: 0 10*3/uL (ref 0.0–0.1)
EOS ABS: 0 10*3/uL (ref 0.0–0.7)
EOS PCT: 0 %
HCT: 30 % — ABNORMAL LOW (ref 39.0–52.0)
Hemoglobin: 9.1 g/dL — ABNORMAL LOW (ref 13.0–17.0)
LYMPHS PCT: 4 %
Lymphs Abs: 0.4 10*3/uL — ABNORMAL LOW (ref 0.7–4.0)
MCH: 26.2 pg (ref 26.0–34.0)
MCHC: 30.3 g/dL (ref 30.0–36.0)
MCV: 86.5 fL (ref 78.0–100.0)
MONO ABS: 0.1 10*3/uL (ref 0.1–1.0)
Monocytes Relative: 1 %
Neutro Abs: 10.3 10*3/uL — ABNORMAL HIGH (ref 1.7–7.7)
Neutrophils Relative %: 95 %
PLATELETS: 133 10*3/uL — AB (ref 150–400)
RBC: 3.47 MIL/uL — ABNORMAL LOW (ref 4.22–5.81)
RDW: 17.4 % — AB (ref 11.5–15.5)
WBC: 10.8 10*3/uL — ABNORMAL HIGH (ref 4.0–10.5)

## 2018-01-17 LAB — BLOOD GAS, ARTERIAL
ACID-BASE EXCESS: 2.9 mmol/L — AB (ref 0.0–2.0)
BICARBONATE: 27.1 mmol/L (ref 20.0–28.0)
DRAWN BY: 24487
FIO2: 40
LHR: 14 {breaths}/min
MECHVT: 570 mL
O2 SAT: 99.2 %
PATIENT TEMPERATURE: 98.6
PCO2 ART: 43.4 mmHg (ref 32.0–48.0)
PEEP/CPAP: 5 cmH2O
PH ART: 7.412 (ref 7.350–7.450)
PO2 ART: 150 mmHg — AB (ref 83.0–108.0)

## 2018-01-17 LAB — GLUCOSE, CAPILLARY
GLUCOSE-CAPILLARY: 170 mg/dL — AB (ref 65–99)
GLUCOSE-CAPILLARY: 186 mg/dL — AB (ref 65–99)
GLUCOSE-CAPILLARY: 191 mg/dL — AB (ref 65–99)
Glucose-Capillary: 156 mg/dL — ABNORMAL HIGH (ref 65–99)
Glucose-Capillary: 217 mg/dL — ABNORMAL HIGH (ref 65–99)
Glucose-Capillary: 221 mg/dL — ABNORMAL HIGH (ref 65–99)

## 2018-01-17 LAB — LIPASE, BLOOD: LIPASE: 21 U/L (ref 11–51)

## 2018-01-17 LAB — AMMONIA: Ammonia: 23 umol/L (ref 9–35)

## 2018-01-17 LAB — MAGNESIUM: MAGNESIUM: 1.8 mg/dL (ref 1.7–2.4)

## 2018-01-17 MED ORDER — AMIODARONE HCL IN DEXTROSE 360-4.14 MG/200ML-% IV SOLN
30.0000 mg/h | INTRAVENOUS | Status: AC
  Administered 2018-01-18 – 2018-01-20 (×4): 30 mg/h via INTRAVENOUS
  Filled 2018-01-17 (×5): qty 200

## 2018-01-17 MED ORDER — AMIODARONE HCL IN DEXTROSE 360-4.14 MG/200ML-% IV SOLN
60.0000 mg/h | INTRAVENOUS | Status: AC
  Administered 2018-01-17 (×2): 60 mg/h via INTRAVENOUS
  Filled 2018-01-17 (×2): qty 200

## 2018-01-17 MED ORDER — AMIODARONE LOAD VIA INFUSION
150.0000 mg | Freq: Once | INTRAVENOUS | Status: AC
  Administered 2018-01-17: 150 mg via INTRAVENOUS
  Filled 2018-01-17: qty 83.34

## 2018-01-17 MED ORDER — MAGNESIUM SULFATE IN D5W 1-5 GM/100ML-% IV SOLN
1.0000 g | Freq: Once | INTRAVENOUS | Status: AC
  Administered 2018-01-17: 1 g via INTRAVENOUS
  Filled 2018-01-17: qty 100

## 2018-01-17 NOTE — Progress Notes (Signed)
PULMONARY / CRITICAL CARE MEDICINE   Name: [name] MRN: [id number] DOB: [birth year]    ADMISSION DATE:  12/19/2017   DISCUSSION:     This is a 72 year old with type 2 diabetes and chronic atrial fibrillation who was initially admitted with dyspnea on exertion.  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  He subsequently has had intermittent seizures and depressed mental status.  ASSESSMENT / PLAN:  PULMONARY A: VDRF due to respiratory failure Per support trials as available  Minimize sedation  Currently not ready for extubation  CARDIOVASCULAR A: Atrial fibrillation is rate controlled, he is not a candidate for anticoagulation due to his subdural hematoma.   Hemodynamic monitoring Rate controlled at 66 currently is now on p.o. Cardizem  GASTROINTESTINAL PPI Tube feeding  HEMATOLOGIC Recent Labs    01/15/18 0439 01/17/18 0500  HGB 9.0* 9.1*    Daily CBC Infused per protocol for hemoglobin less than 7  ENDOCRINE CBG (last 3)  Recent Labs    01/16/18 1957 01/17/18 0355 01/17/18 0736  GLUCAP 242* 186* 156*    A: Hyperglycemia -Tapazole up with TSH normal -Transition Solu-Medrol to prednisone -CBG continues to improve on current treatment  NEUROLOGIC A: Remains delirious on precedex and fentanyl drip Minimize sedation As tolerated  Renal: hypernatremia Lab Results  Component Value Date   CREATININE 0.82 01/16/2018   CREATININE 1.02 01/15/2018   CREATININE 0.95 01/14/2018   Recent Labs  Lab 01/14/18 0453 01/15/18 0439 01/16/18 0532  K 4.5 4.7 4.1   Recent Labs  Lab 01/14/18 0453 01/15/18 0439 01/16/18 0532  NA 158* 151* 149*    Sodium is improving on D5 Continue free water to 250 every 6 hours    HISTORY OF PRESENT ILLNESS:        This is a 72 year-old with a history of type 2 diabetes, chronic atrial fibrillation, and chronic kidney disease who was initially admitted on 3/29 complaining of dyspnea on exertion.  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  He has had a decline in his mental status and intermittent seizure activity and has required intubation and mechanical ventilation.  CT scan of the head has shown a right subdural hematoma without mass-effect.  An MRI which was obtained yesterday suggested some enhancement of the meninges therefore an LP was performed on 4/15.  There are no white cells in that fluid and the protein is 74.   He remains intubated and mechanically ventilated this morning.  He is currently sedated on 75 mcg of fentanyl and 0.6 of Precedex.  When awakened he is not interactive.  He was bradycardic yesterday and his sodium channel blocker was held.  01/16/18 No major overnight event Currently sedated on 100 fentanyl and 1 of precedex Neurology following suggest possibility of toxic encephalopathy LP was ok not suggestive of infection, BS improved  SIGNIFICANT EVENTS  Intubation 4/2  STUDIES:  EEG 4/7, 4/11 LP 4/15 Blood culture 1:2 Coag negative staph -- all other negative  MRI Head: 4/13: 1. Similar acute to subacute RIGHT holohemispheric subdural hematoma. Trace residual falcotentorial subdural hematoma. Near complete resolution of LEFT subdural hematoma. ANTIBIOTICS: None     SUBJECTIVE:  Pressure support is not awake enough to attempt any type of extubation  VITAL SIGNS: BP 123/79   Pulse 62   Temp 99.8 F (37.7 C) (Axillary)   Resp 14   Ht 5\' 9"  (1.753 m)  Wt 117.5 kg (259 lb 0.7 oz)   SpO2 100%   BMI 38.25 kg/m   HEMODYNAMICS:    VENTILATOR SETTINGS: Vent Mode: CPAP;PSV FiO2 (%):  [40 %] 40 % Set Rate:  [14 bmp] 14 bmp Vt Set:  [570 mL] 570 mL PEEP:  [5 cmH20] 5 cmH20 Pressure Support:  [5 cmH20] 5 cmH20 Plateau Pressure:  [8 cmH20] 8 cmH20  INTAKE / OUTPUT: I/O last 3 completed shifts: In: 7189.4 [I.V.:4225.4; NG/GT:2714; IV Piggyback:250] Out: 9833  [Urine:4715; Stool:275]  PHYSICAL EXAMINATION: General: Disheveled male sedated on the ventilator HEENT: Endotracheal tube to ventilator, orogastric tube tube feedings PSY: Sedated Neuro: Sedated does not follow commands CV: Sounds are regular PULM: Rhonchi* AS:NKNL, non-tender, bsx4 active  Extremities: warm/dry, positive edema  Skin: no rashes or lesions\  LABS:  BMET Recent Labs  Lab 01/14/18 0453 01/15/18 0439 01/16/18 0532  NA 158* 151* 149*  K 4.5 4.7 4.1  CL 127* 122* 118*  CO2 24 26 25   BUN 71* 65* 54*  CREATININE 0.95 1.02 0.82  GLUCOSE 98 268* 164*   Electrolytes Recent Labs  Lab 01/14/18 0453 01/15/18 0439 01/16/18 0532  CALCIUM 11.2* 10.4* 10.2  MG  --  1.9 1.8  PHOS  --  3.3 3.7   CBC Recent Labs  Lab 01/14/18 0453 01/15/18 0439 01/17/18 0500  WBC 10.7* 11.9* 10.8*  HGB 8.8* 9.0* 9.1*  HCT 30.0* 30.5* 30.0*  PLT 222 174 133*   Coag's Recent Labs  Lab 01/15/18 0439  INR 1.33    Sepsis Markers No results for input(s): LATICACIDVEN, PROCALCITON, O2SATVEN in the last 168 hours. ABG Recent Labs  Lab 01/15/18 0405 01/16/18 0333 01/17/18 0435  PHART 7.366 7.374 7.412  PCO2ART 48.5* 46.1 43.4  PO2ART 116* 115* 150*   Liver Enzymes Recent Labs  Lab 01/13/18 0421 01/15/18 0439 01/16/18 0532  AST 22 18 47*  ALT 205* 144* 187*  ALKPHOS 95 86 88  BILITOT 0.7 0.7 0.7  ALBUMIN 1.9* 1.9* 1.8*   Cardiac Enzymes No results for input(s): TROPONINI, PROBNP in the last 168 hours. Glucose Recent Labs  Lab 01/16/18 1135 01/16/18 1543 01/16/18 1632 01/16/18 1957 01/17/18 0355 01/17/18 0736  GLUCAP 224* 255* 247* 242* 186* 156*   Imaging Dg Chest Port 1 View  Result Date: 01/17/2018 CLINICAL DATA:  Shortness of breath. EXAM: PORTABLE CHEST 1 VIEW COMPARISON:  01/16/2018 FINDINGS: Endotracheal tube terminates 5 cm above the carina. Enteric tube terminates at the level of the distal stomach. Right PICC terminates over the mid upper  SVC. The cardiac silhouette remains enlarged. Aortic atherosclerosis is noted. Left basilar consolidation is unchanged, with a small left pleural effusion again suspected. Milder right basilar infiltrate or atelectasis is also unchanged. No pneumothorax is identified. IMPRESSION: 1. Unchanged left basilar consolidation and small pleural effusion. 2. Unchanged mild right basilar atelectasis or infiltrate. Electronically Signed   By: Logan Bores M.D.   On: 01/17/2018 09:19    ANTIBIOTICS: Antibiotics discontinued 01/12/2018    App cct 30 min  Richardson Landry Minor ACNP [facility] PCCM Pager [phone] till 1 pm If no answer page [phone] 01/17/2018, 10:03 AM     .Attending Note:  I have examined patient, reviewed labs, studies and notes. I have discussed the case with S Minor, and I agree with the data and plans as amended above.   72 year old gentleman history of diabetes and chronic atrial fibrillation admitted with thyrotoxicosis, dyspnea and pulmonary edema.  Course complicated by seizures and then  encephalopathy.  He was found to have subdural hematoma.  LP was reassuring.  He required mechanical ventilation for airway protection.  He remains ventilated.  Course also complicated by hypernatremia, atrial fibrillation with RVR. Vitals:   01/17/18 0814 01/17/18 1145 01/17/18 1200 01/17/18 1300  BP:   (!) 149/81 124/75  Pulse:   75 92  Resp:   16 17  Temp:   99.1 F (37.3 C)   TempSrc:   Axillary   SpO2: 100% 100% 100% 100%  Weight:      Height:      On my evaluation today he is slightly more awake now that his fentanyl drip has been weaned off.  He is still on some Precedex.  His eyes are open and he is looking around the room, may be intermittently tracking.  He is not following commands or nodding to questions.  He is tolerating pressure support 8 at this time.  His lungs are clear bilaterally, decreased at both bases.  Heart is irregular, tachycardic.  No murmur.  His abdomen is soft,  nontender, positive bowel sounds.  Currently tolerating pressure support.  We will continue efforts at spontaneous breathing with a goal of an extubation when mental status will allow.  He is more awake currently than on previous evaluations per the notes.  He is still not awake enough to consider extubation.  Independent critical care time is 34 minutes.   Baltazar Apo, MD, PhD 01/17/2018, 2:53 PM [facility] [phone] or if no answer [phone]

## 2018-01-17 NOTE — Progress Notes (Signed)
   01/17/18 1906  Vitals  Pulse Rate (!) 154  ECG Heart Rate (!) 156  Resp 12  Oxygen Therapy  SpO2 99 %  Provider Notification  Provider Name/Title Oletta Darter, MD  Time Notified 1910  Notification Type Call  Notification Reason Change in status (a-fib RVR)  Response See new orders  Time of Response 1910

## 2018-01-17 NOTE — Progress Notes (Signed)
[facility] Progress Note Patient Name: [name] DOB: 20-May-1946 MRN: [id number]   Date of Service  01/17/2018  HPI/Events of Note  AFIB with RVR - Ventricular rate in 130's to 150's. Hx of AFIB and already on Cardizem PO. Mg++ = 1.8 earlier today.   eICU Interventions  Will order: 1. Replace Mg++.  2. Continue Cardizem PO.  3. BMP STAT.  4. Amiodarone IV load and infusion.      Intervention Category Major Interventions: Arrhythmia - evaluation and management  Sommer,Steven Eugene 01/17/2018, 7:14 PM

## 2018-01-18 ENCOUNTER — Inpatient Hospital Stay (HOSPITAL_COMMUNITY): Payer: Medicare Other

## 2018-01-18 LAB — CBC WITH DIFFERENTIAL/PLATELET
BASOS ABS: 0 10*3/uL (ref 0.0–0.1)
BASOS PCT: 0 %
EOS PCT: 0 %
Eosinophils Absolute: 0 10*3/uL (ref 0.0–0.7)
HEMATOCRIT: 28.4 % — AB (ref 39.0–52.0)
Hemoglobin: 8.8 g/dL — ABNORMAL LOW (ref 13.0–17.0)
LYMPHS PCT: 3 %
Lymphs Abs: 0.4 10*3/uL — ABNORMAL LOW (ref 0.7–4.0)
MCH: 26.4 pg (ref 26.0–34.0)
MCHC: 31 g/dL (ref 30.0–36.0)
MCV: 85.3 fL (ref 78.0–100.0)
MONO ABS: 0.1 10*3/uL (ref 0.1–1.0)
Monocytes Relative: 1 %
NEUTROS ABS: 11.1 10*3/uL — AB (ref 1.7–7.7)
Neutrophils Relative %: 96 %
PLATELETS: 114 10*3/uL — AB (ref 150–400)
RBC: 3.33 MIL/uL — ABNORMAL LOW (ref 4.22–5.81)
RDW: 17.5 % — AB (ref 11.5–15.5)
WBC: 11.6 10*3/uL — AB (ref 4.0–10.5)

## 2018-01-18 LAB — MAGNESIUM: Magnesium: 1.8 mg/dL (ref 1.7–2.4)

## 2018-01-18 LAB — BASIC METABOLIC PANEL
ANION GAP: 7 (ref 5–15)
BUN: 57 mg/dL — ABNORMAL HIGH (ref 6–20)
CALCIUM: 9.4 mg/dL (ref 8.9–10.3)
CO2: 24 mmol/L (ref 22–32)
Chloride: 107 mmol/L (ref 101–111)
Creatinine, Ser: 0.88 mg/dL (ref 0.61–1.24)
GFR calc Af Amer: >60 mL/min (ref >60)
GLUCOSE: 251 mg/dL — AB (ref 65–99)
POTASSIUM: 4.1 mmol/L (ref 3.5–5.1)
SODIUM: 138 mmol/L (ref 135–145)

## 2018-01-18 LAB — GLUCOSE, CAPILLARY
GLUCOSE-CAPILLARY: 200 mg/dL — AB (ref 65–99)
GLUCOSE-CAPILLARY: 221 mg/dL — AB (ref 65–99)
Glucose-Capillary: 213 mg/dL — ABNORMAL HIGH (ref 65–99)
Glucose-Capillary: 171 mg/dL — ABNORMAL HIGH (ref 65–99)
Glucose-Capillary: 88 mg/dL (ref 65–99)
Glucose-Capillary: 135 mg/dL — ABNORMAL HIGH (ref 65–99)

## 2018-01-18 LAB — PHOSPHORUS: PHOSPHORUS: 2.9 mg/dL (ref 2.5–4.6)

## 2018-01-18 MED ORDER — INSULIN DETEMIR 100 UNIT/ML ~~LOC~~ SOLN
18.0000 [IU] | Freq: Two times a day (BID) | SUBCUTANEOUS | Status: DC
  Administered 2018-01-18: 18 [IU] via SUBCUTANEOUS
  Filled 2018-01-18 (×2): qty 0.18

## 2018-01-18 MED ORDER — ORAL CARE MOUTH RINSE
15.0000 mL | Freq: Two times a day (BID) | OROMUCOSAL | Status: DC
  Administered 2018-01-19 – 2018-01-21 (×6): 15 mL via OROMUCOSAL

## 2018-01-18 MED ORDER — INSULIN ASPART 100 UNIT/ML ~~LOC~~ SOLN
0.0000 [IU] | SUBCUTANEOUS | Status: DC
  Administered 2018-01-19: 5 [IU] via SUBCUTANEOUS
  Administered 2018-01-19: 2 [IU] via SUBCUTANEOUS
  Administered 2018-01-19 (×2): 5 [IU] via SUBCUTANEOUS
  Administered 2018-01-19 – 2018-01-20 (×4): 3 [IU] via SUBCUTANEOUS
  Administered 2018-01-20: 5 [IU] via SUBCUTANEOUS
  Administered 2018-01-20 – 2018-01-21 (×5): 3 [IU] via SUBCUTANEOUS
  Administered 2018-01-21: 2 [IU] via SUBCUTANEOUS
  Administered 2018-01-22 (×3): 3 [IU] via SUBCUTANEOUS
  Administered 2018-01-22: 2 [IU] via SUBCUTANEOUS
  Administered 2018-01-22 – 2018-01-24 (×9): 3 [IU] via SUBCUTANEOUS
  Administered 2018-01-24: 2 [IU] via SUBCUTANEOUS
  Administered 2018-01-24: 6 [IU] via SUBCUTANEOUS
  Administered 2018-01-24: 3 [IU] via SUBCUTANEOUS
  Administered 2018-01-24 (×2): 2 [IU] via SUBCUTANEOUS
  Administered 2018-01-25: 8 [IU] via SUBCUTANEOUS
  Administered 2018-01-25: 3 [IU] via SUBCUTANEOUS
  Administered 2018-01-25: 5 [IU] via SUBCUTANEOUS
  Administered 2018-01-25: 3 [IU] via SUBCUTANEOUS
  Administered 2018-01-25: 5 [IU] via SUBCUTANEOUS
  Administered 2018-01-25: 3 [IU] via SUBCUTANEOUS
  Administered 2018-01-25 – 2018-01-26 (×3): 5 [IU] via SUBCUTANEOUS
  Administered 2018-01-26: 3 [IU] via SUBCUTANEOUS
  Administered 2018-01-26: 8 [IU] via SUBCUTANEOUS
  Administered 2018-01-26 – 2018-01-27 (×5): 5 [IU] via SUBCUTANEOUS
  Administered 2018-01-27 (×2): 3 [IU] via SUBCUTANEOUS
  Administered 2018-01-28: 5 [IU] via SUBCUTANEOUS
  Administered 2018-01-28: 8 [IU] via SUBCUTANEOUS
  Administered 2018-01-28 (×4): 5 [IU] via SUBCUTANEOUS
  Administered 2018-01-29: 2 [IU] via SUBCUTANEOUS
  Administered 2018-01-29 (×3): 5 [IU] via SUBCUTANEOUS

## 2018-01-18 MED ORDER — CHLORHEXIDINE GLUCONATE 0.12 % MT SOLN
15.0000 mL | Freq: Two times a day (BID) | OROMUCOSAL | Status: DC
  Administered 2018-01-18 – 2018-01-28 (×15): 15 mL via OROMUCOSAL
  Filled 2018-01-18 (×11): qty 15

## 2018-01-18 MED ORDER — METOPROLOL TARTRATE 5 MG/5ML IV SOLN
5.0000 mg | INTRAVENOUS | Status: DC | PRN
  Administered 2018-01-18 (×2): 5 mg via INTRAVENOUS
  Administered 2018-01-19 (×4): 10 mg via INTRAVENOUS
  Administered 2018-01-20: 5 mg via INTRAVENOUS
  Administered 2018-01-20 – 2018-01-23 (×2): 10 mg via INTRAVENOUS
  Administered 2018-01-24: 5 mg via INTRAVENOUS
  Administered 2018-01-24: 10 mg via INTRAVENOUS
  Administered 2018-01-27: 5 mg via INTRAVENOUS
  Filled 2018-01-18 (×3): qty 10
  Filled 2018-01-18: qty 5
  Filled 2018-01-18 (×2): qty 10
  Filled 2018-01-18: qty 5
  Filled 2018-01-18 (×3): qty 10
  Filled 2018-01-18: qty 5
  Filled 2018-01-18: qty 10

## 2018-01-18 NOTE — Progress Notes (Signed)
PULMONARY / CRITICAL CARE MEDICINE   Name: [name] MRN: [id number] DOB: Feb 17, 1946    ADMISSION DATE:  12/08/2017   DISCUSSION:     This is a 72 year old with type 2 diabetes and chronic atrial fibrillation who was initially admitted with dyspnea on exertion.  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  He subsequently has had intermittent seizures and depressed mental status.  01/18/2018 much more awake down sedation has been held.  Evaluate for possible extubation next 24-48 hours  ASSESSMENT / PLAN:  PULMONARY A: VDRF due to respiratory failure Per support trials as available  Minimize sedation 01/18/2018 he may be ready for attempted extubation next 24-48 hours  CARDIOVASCULAR A: Atrial fibrillation is rate controlled, he is not a candidate for anticoagulation due to his subdural hematoma.   Hemodynamic monitoring 01/18/2018 he was placed on amiodarone for atrial fibrillation ventricular response, he has p.o. Cardizem for which she remains on.  GASTROINTESTINAL PPI Tube feeding  HEMATOLOGIC Recent Labs    01/17/18 0500 01/18/18 0528  HGB 9.1* 8.8*    Daily CBC Infused per protocol for hemoglobin less than 7.0  ENDOCRINE CBG (last 3)  Recent Labs    01/18/18 0021 01/18/18 0516 01/18/18 0732  GLUCAP 200* 221* 213*    A: Hyperglycemia  Sliding scale insulin as needed Basal insulin increased 01/18/2018 Remains on Tapazole   NEUROLOGIC A: 01/18/2018 much more awake Sedation has been decreased Minimize sedation He may need an antidepressant as he appears to have a depressive effect on examination.  Renal: hypernatremia Lab Results  Component Value Date   CREATININE 0.88 01/18/2018   CREATININE 0.95 01/17/2018   CREATININE 0.87 01/17/2018   Recent Labs  Lab 01/17/18 0845 01/17/18 2032 01/18/18 0528  K 4.5 3.8 4.1   Recent Labs  Lab 01/17/18 0845 01/17/18 2032 01/18/18 0528  NA 140 136 138    Sodium is improving on  D5 Continue free water to 250 every 6 hours    HISTORY OF PRESENT ILLNESS:        This is a 72 year-old with a history of type 2 diabetes, chronic atrial fibrillation, and chronic kidney disease who was initially admitted on 3/29 complaining of dyspnea on exertion.  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  He has had a decline in his mental status and intermittent seizure activity and has required intubation and mechanical ventilation.  CT scan of the head has shown a right subdural hematoma without mass-effect.  An MRI which was obtained yesterday suggested some enhancement of the meninges therefore an LP was performed on 4/15.  There are no white cells in that fluid and the protein is 74.   He remains intubated and mechanically ventilated this morning.  He is currently sedated on 75 mcg of fentanyl and 0.6 of Precedex.  When awakened he is not interactive.  He was bradycardic yesterday and his sodium channel blocker was held.  01/16/18 No major overnight event Currently sedated on 100 fentanyl and 1 of precedex Neurology following suggest possibility of toxic encephalopathy LP was ok not suggestive of infection, BS improved  421 significantly more awake sedation has been decreased  SIGNIFICANT EVENTS  Intubation 4/2  STUDIES:  EEG 4/7, 4/11 LP 4/15 Blood culture 1:2 Coag negative staph -- all other negative  MRI Head: 4/13: 1. Similar acute to subacute RIGHT holohemispheric subdural hematoma. Trace residual falcotentorial subdural hematoma. Near complete  resolution of LEFT subdural hematoma. ANTIBIOTICS: None     SUBJECTIVE:  Much more awake now is sedation has been decreased.  He may possibly be ready for trial extubation in the next 24-48 hours.  VITAL SIGNS: BP 106/73   Pulse 91   Temp 98 F (36.7 C) (Axillary)   Resp 18   Ht 5\' 9"  (1.753 m)   Wt 114.5 kg (252 lb 6.8 oz)   SpO2 100%    BMI 37.28 kg/m   HEMODYNAMICS:    VENTILATOR SETTINGS: Vent Mode: CPAP;PSV FiO2 (%):  [40 %] 40 % Set Rate:  [14 bmp] 14 bmp Vt Set:  [570 mL] 570 mL PEEP:  [5 cmH20] 5 cmH20 Pressure Support:  [5 cmH20-8 cmH20] 5 cmH20 Plateau Pressure:  [11 cmH20-14 cmH20] 11 cmH20  INTAKE / OUTPUT: I/O last 3 completed shifts: In: 5812.6 [I.V.:3182.6; NG/GT:2230; IV Piggyback:400] Out: 7510 [Urine:3340; Stool:675]  PHYSICAL EXAMINATION: General: Depressed but awake male who does not follow commands. HEENT: Endotracheal tube connected to ventilator PSY: Depressed crying Neuro: Does not follow verbal commands but RN reports he does follow commands at time.  Withdraws to painful stimuli CV: Sounds are regular regular noted to have atrial fibrillation with rapid ventricular response started on amiodarone drip 01/18/2018 PULM: Coarse rhonchi bilaterally decreased breath sounds in the bases GI: Obese, colostomy in place.  Tender to touch Extremities: warm/dry, 2+ edema  Skin: no rashes or lesions   LABS:  BMET Recent Labs  Lab 01/17/18 0845 01/17/18 2032 01/18/18 0528  NA 140 136 138  K 4.5 3.8 4.1  CL 110 105 107  CO2 25 24 24   BUN 56* 60* 57*  CREATININE 0.87 0.95 0.88  GLUCOSE 207* 238* 251*   Electrolytes Recent Labs  Lab 01/15/18 0439 01/16/18 0532 01/17/18 0845 01/17/18 2032 01/18/18 0528  CALCIUM 10.4* 10.2 9.6 9.4 9.4  MG 1.9 1.8 1.8  --  1.8  PHOS 3.3 3.7  --   --  2.9   CBC Recent Labs  Lab 01/15/18 0439 01/17/18 0500 01/18/18 0528  WBC 11.9* 10.8* 11.6*  HGB 9.0* 9.1* 8.8*  HCT 30.5* 30.0* 28.4*  PLT 174 133* 114*   Coag's Recent Labs  Lab 01/15/18 0439  INR 1.33    Sepsis Markers No results for input(s): LATICACIDVEN, PROCALCITON, O2SATVEN in the last 168 hours. ABG Recent Labs  Lab 01/15/18 0405 01/16/18 0333 01/17/18 0435  PHART 7.366 7.374 7.412  PCO2ART 48.5* 46.1 43.4  PO2ART 116* 115* 150*   Liver Enzymes Recent Labs  Lab  01/15/18 0439 01/16/18 0532 01/17/18 0845  AST 18 47* 24  ALT 144* 187* 186*  ALKPHOS 86 88 84  BILITOT 0.7 0.7 0.7  ALBUMIN 1.9* 1.8* 1.9*   Cardiac Enzymes No results for input(s): TROPONINI, PROBNP in the last 168 hours. Glucose Recent Labs  Lab 01/17/18 1117 01/17/18 1526 01/17/18 1926 01/18/18 0021 01/18/18 0516 01/18/18 0732  GLUCAP 217* 191* 221* 200* 221* 213*   Imaging Dg Chest Port 1 View  Result Date: 01/18/2018 CLINICAL DATA:  Respiratory failure. EXAM: PORTABLE CHEST 1 VIEW COMPARISON:  01/17/2018 FINDINGS: Endotracheal tube terminates at the inferior margin of the clavicular heads, well above the carina. Enteric tube courses into the left upper abdomen with tip not imaged. Right PICC terminates over the mid upper SVC. The cardiac silhouette remains enlarged. Left basilar airspace opacities are again seen with some interval improved aeration. Minimal right basilar opacities are unchanged. No sizable pleural effusion or pneumothorax is identified.  IMPRESSION: Slightly improved left basilar aeration. Electronically Signed   By: Logan Bores M.D.   On: 01/18/2018 08:12    ANTIBIOTICS: Antibiotics discontinued 01/12/2018    App cct 30 min  Richardson Landry Minor ACNP [facility] PCCM Pager [phone] till 1 pm If no answer page [phone] 01/18/2018, 8:22 AM

## 2018-01-18 NOTE — Progress Notes (Signed)
   01/18/18 0216  Vitals  Pulse Rate (!) 55  ECG Heart Rate (!) 55  Resp 12  Oxygen Therapy  SpO2 100 %   Fentanyl stopped and lowered precedex dose. Will continue to monitor

## 2018-01-19 ENCOUNTER — Inpatient Hospital Stay (HOSPITAL_COMMUNITY): Payer: Medicare Other

## 2018-01-19 LAB — CBC WITH DIFFERENTIAL/PLATELET
BASOS ABS: 0 10*3/uL (ref 0.0–0.1)
BLASTS: 0 %
Band Neutrophils: 4 %
Basophils Relative: 0 %
EOS PCT: 0 %
Eosinophils Absolute: 0 10*3/uL (ref 0.0–0.7)
HEMATOCRIT: 33.3 % — AB (ref 39.0–52.0)
HEMOGLOBIN: 10.5 g/dL — AB (ref 13.0–17.0)
LYMPHS ABS: 0.2 10*3/uL — AB (ref 0.7–4.0)
Lymphocytes Relative: 1 %
MCH: 26.6 pg (ref 26.0–34.0)
MCHC: 31.5 g/dL (ref 30.0–36.0)
MCV: 84.5 fL (ref 78.0–100.0)
METAMYELOCYTES PCT: 0 %
MONOS PCT: 0 %
Monocytes Absolute: 0 10*3/uL — ABNORMAL LOW (ref 0.1–1.0)
Myelocytes: 0 %
NEUTROS ABS: 22 10*3/uL — AB (ref 1.7–7.7)
Neutrophils Relative %: 95 %
OTHER: 0 %
Platelets: 174 10*3/uL (ref 150–400)
Promyelocytes Relative: 0 %
RBC: 3.94 MIL/uL — AB (ref 4.22–5.81)
RDW: 18.2 % — AB (ref 11.5–15.5)
WBC: 22.2 10*3/uL — AB (ref 4.0–10.5)
nRBC: 0 /100 WBC

## 2018-01-19 LAB — GLUCOSE, CAPILLARY
GLUCOSE-CAPILLARY: 118 mg/dL — AB (ref 65–99)
GLUCOSE-CAPILLARY: 198 mg/dL — AB (ref 65–99)
GLUCOSE-CAPILLARY: 238 mg/dL — AB (ref 65–99)
Glucose-Capillary: 142 mg/dL — ABNORMAL HIGH (ref 65–99)
Glucose-Capillary: 173 mg/dL — ABNORMAL HIGH (ref 65–99)
Glucose-Capillary: 222 mg/dL — ABNORMAL HIGH (ref 65–99)
Glucose-Capillary: 244 mg/dL — ABNORMAL HIGH (ref 65–99)

## 2018-01-19 LAB — BASIC METABOLIC PANEL
Anion gap: 7 (ref 5–15)
BUN: 31 mg/dL — AB (ref 6–20)
CHLORIDE: 105 mmol/L (ref 101–111)
CO2: 25 mmol/L (ref 22–32)
Calcium: 10 mg/dL (ref 8.9–10.3)
Creatinine, Ser: 0.86 mg/dL (ref 0.61–1.24)
GFR calc Af Amer: >60 mL/min (ref >60)
GFR calc non Af Amer: >60 mL/min (ref >60)
GLUCOSE: 263 mg/dL — AB (ref 65–99)
POTASSIUM: 3.7 mmol/L (ref 3.5–5.1)
SODIUM: 137 mmol/L (ref 135–145)

## 2018-01-19 LAB — MAGNESIUM: Magnesium: 1.8 mg/dL (ref 1.7–2.4)

## 2018-01-19 LAB — PHOSPHORUS: PHOSPHORUS: 2.7 mg/dL (ref 2.5–4.6)

## 2018-01-19 MED ORDER — PROMETHAZINE HCL 25 MG/ML IJ SOLN
12.5000 mg | Freq: Four times a day (QID) | INTRAMUSCULAR | Status: DC | PRN
Start: 2018-01-19 — End: 2018-01-20
  Administered 2018-01-19 (×2): 12.5 mg via INTRAVENOUS
  Filled 2018-01-19 (×2): qty 1

## 2018-01-19 MED ORDER — VITAL HIGH PROTEIN PO LIQD
1000.0000 mL | ORAL | Status: DC
  Administered 2018-01-19: 1000 mL

## 2018-01-19 MED ORDER — VITAL 1.5 CAL PO LIQD
1000.0000 mL | ORAL | Status: DC
  Filled 2018-01-19 (×3): qty 1000

## 2018-01-19 MED ORDER — INSULIN ASPART 100 UNIT/ML ~~LOC~~ SOLN
3.0000 [IU] | SUBCUTANEOUS | Status: DC
  Administered 2018-01-19 – 2018-01-29 (×38): 3 [IU] via SUBCUTANEOUS

## 2018-01-19 NOTE — Progress Notes (Signed)
Inpatient Diabetes Program Recommendations  AACE/ADA: New Consensus Statement on Inpatient Glycemic Control (2015)  Target Ranges:  Prepandial:   less than 140 mg/dL      Peak postprandial:   less than 180 mg/dL (1-2 hours)      Critically ill patients:  140 - 180 mg/dL   Results for Dwayne, Shea (MRN [id number]) as of 01/19/2018 10:43  Ref. Range 01/18/2018 00:21 01/18/2018 05:16 01/18/2018 07:32 01/18/2018 11:26 01/18/2018 15:52 01/18/2018 20:04  Glucose-Capillary Latest Ref Range: 65 - 99 mg/dL 200 (H)  10 units NOVOLOG  221 (H)  13 units NOVOLOG 213 (H)  13 units NOVOLOG 171 (H)  10 units NOVOLOG +  18 units LEVEMIR 88  0 units NOVOLOG 135 (H)  0 units NOVOLOG    Results for Dwayne, Shea (MRN [id number]) as of 01/19/2018 10:43  Ref. Range 01/19/2018 00:10 01/19/2018 04:06 01/19/2018 08:11  Glucose-Capillary Latest Ref Range: 65 - 99 mg/dL 173 (H)  0 units NOVOLOG 244 (H)  5 units NOVOLOG 238 (H)  8 units NOVOLOG    Home DM Meds: Metformin 1000 mg BID       Actos 45 mg daily  Current Insulin Orders: Novolog Moderate Correction Scale/ SSI (0-15 units) Q4 hours         Novolog 3 units Q4 hours       Patient getting Solumedrol 20 mg BID.  Also getting tube feeds at 20cc/hr.    MD- Note that patient was getting Levemir.  Levemir was discontinued yesterday.  CBGs on the rise today.  Please consider adding back Levemir 10 units BID (0.2 units/kg based on weight of 110 kg)    --Will follow patient during hospitalization--  Wyn Quaker RN, MSN, CDE Diabetes Coordinator Inpatient Glycemic Control Team Team Pager: [phone] (8a-5p)

## 2018-01-19 NOTE — Progress Notes (Signed)
[facility] Progress Note Patient Name: WOODS GANGEMI DOB: 1945-12-06 MRN: [id number]   Date of Service  01/19/2018  HPI/Events of Note  Emesis - No response to Zofran. Possible aspiration. Portable CXR already ordered for this AM.  eICU Interventions  Will order: 1. Phenergan 12.5 mg IV Q 6 hours PRN N/V.       Intervention Category Major Interventions: Other:  Lysle Dingwall 01/19/2018, 4:41 AM

## 2018-01-19 NOTE — Evaluation (Addendum)
Occupational Therapy Evaluation Patient Details Name: [name] MRN: [id number] DOB: 30-Sep-1946 Today's Date: 01/19/2018    History of Present Illness 72 y.o. male with a history of a fib, stage 3 chron c kidney disease, DM2, adenocarcinoma of colon s/p resection and current colostomy, history of renal cell carcinoma s/o resection.  Pt admitted on 12/20/17, initially found to have worsening shortness of breath. Few hours after admission patient became confused and agitated pulling out IV line. MRI stable right greater than left subdural hematomas since the head CT The right side subdural demonstrates greater lobulation and signal heterogeneity measuring between 8 and 17 mm in thickness. The Stable mild intracranial mass effect with trace leftward midline shift. Normal basilar cisterns. Trace intraventricular hemorrhage with no ventriculomegaly.       Clinical Impression   Pt received awake supine in bed. Pt motivated to get OOB per RN. Pt reporting he lives with his wife; poor historian due to communication and cognitive deficits and providing inconsistent information. Pt requiring Total A for ADLs and bed mobility. Pt requiring total hand over hand to perform grooming at EOB with total A for trunk support and to block bilateral knees. Pt requiring Total A for use of Maxisky for transfer to recliner. Pt will require further acute OT to facilitate safe dc and increase occupational performance. Recommend dc to post-acute rehab for further OT to optimize safety and independence with ADLs and functional mobility as well as decrease caregiver burden.     Follow Up Recommendations  SNF    Equipment Recommendations  Other (comment)(Defer to next venue)    Recommendations for Other Services PT consult;Speech consult     Precautions / Restrictions Precautions Precautions: Fall Restrictions Weight Bearing Restrictions: No      Mobility Bed Mobility Overal bed mobility: Needs Assistance Bed  Mobility: Rolling;Supine to Sit;Sit to Supine Rolling: Total assist;+2 for physical assistance   Supine to sit: Total assist;+2 for physical assistance Sit to supine: Total assist;+2 for physical assistance   General bed mobility comments: Total A to bring BLEs towards EOB and levate trunk into sitting. Pt requiring Total A to roll side to side during placement of lift pad.   Transfers Overall transfer level: Needs assistance               General transfer comment: Maxi sky utilized to transfer pt to recliner. Pt requiring Total A to roll side to side during placement of pad a bed level. Pt able to maintain upright head position during transition    Balance Overall balance assessment: Needs assistance Sitting-balance support: Feet supported Sitting balance-Leahy Scale: Zero Sitting balance - Comments: Pt requiring Total A to maintain sitting at EOB with bilateral knees blocked                                   ADL either performed or assessed with clinical judgement   ADL Overall ADL's : Needs assistance/impaired Eating/Feeding: NPO   Grooming: Brushing hair;Sitting;Total assistance Grooming Details (indicate cue type and reason): Total hand over hand to bring RUE up to head for brushing hair. Pt unable to maintain grasp on comb. Requiring total A to maintain sitting balance at EOB.  Upper Body Bathing: Total assistance;Bed level;Sitting   Lower Body Bathing: Total assistance;Bed level   Upper Body Dressing : Total assistance;Sitting;Bed level   Lower Body Dressing: Total assistance;Bed level  Functional mobility during ADLs: Total assistance(Maxi sky utilized to transfer pt to recliner) General ADL Comments: Pt with poor cognition, strength, balance, and activity tolerance. Pt requiring Max cues throughout session to particiapte in ADLs and bed mobility. Pt Requiring Total A+2 to maintain upright sitting position at EOB with second person  blocking BLEs.      Vision         Perception     Praxis      Pertinent Vitals/Pain Pain Assessment: Faces Faces Pain Scale: Hurts a little bit Pain Intervention(s): Monitored during session     Hand Dominance Right   Extremity/Trunk Assessment Upper Extremity Assessment Upper Extremity Assessment: RUE deficits/detail;LUE deficits/detail RUE Deficits / Details: Poor grasp strength and poor initation of movement. No active movement of shoulder, elbow, and wrist.  RUE Coordination: decreased fine motor;decreased gross motor LUE Deficits / Details: Poor grasp strength and poor initation of movement. No active movement of shoulder, elbow, and wrist.  LUE Coordination: decreased fine motor;decreased gross motor   Lower Extremity Assessment Lower Extremity Assessment: Defer to PT evaluation       Communication Communication Communication: Other (comment);Expressive difficulties(Slow processing)   Cognition Arousal/Alertness: Awake/alert Behavior During Therapy: Flat affect Overall Cognitive Status: No family/caregiver present to determine baseline cognitive functioning Area of Impairment: Attention;Following commands;Safety/judgement;Awareness;Problem solving                   Current Attention Level: Focused   Following Commands: Follows one step commands inconsistently Safety/Judgement: Decreased awareness of safety;Decreased awareness of deficits Awareness: Intellectual Problem Solving: Slow processing;Decreased initiation;Difficulty sequencing;Requires verbal cues;Requires tactile cues General Comments: Inconsistantly answering simple, direct question about home. Requiring max tactile, verbal, and visual cues to participate in ADLs and mobility. During simple commands, pt would make eye contact and state "yes" in agreement to command but not follow commands.    General Comments       Exercises     Shoulder Instructions      Home Living Family/patient  expects to be discharged to:: Private residence Living Arrangements: Spouse/significant other(Wife) Available Help at Discharge: Family Type of Home: House Home Access: Stairs to enter     Home Layout: One level     Bathroom Shower/Tub: Tub/shower unit;Walk-in shower   Bathroom Toilet: Standard         Additional Comments: questionable historian      Prior Functioning/Environment          Comments: No family present to provide PLOF        OT Problem List: Decreased strength;Decreased range of motion;Decreased activity tolerance;Impaired balance (sitting and/or standing);Decreased coordination;Decreased cognition;Decreased safety awareness;Decreased knowledge of use of DME or AE;Decreased knowledge of precautions;Impaired UE functional use      OT Treatment/Interventions: Self-care/ADL training;Therapeutic exercise;Energy conservation;DME and/or AE instruction;Therapeutic activities;Patient/family education;Balance training    OT Goals(Current goals can be found in the care plan section) Acute Rehab OT Goals Patient Stated Goal: Unstated OT Goal Formulation: Patient unable to participate in goal setting Time For Goal Achievement: Feb 22, 2018 Potential to Achieve Goals: Good ADL Goals Pt Will Perform Grooming: with mod assist;sitting(supported sitting) Pt Will Perform Upper Body Bathing: with mod assist;sitting(supported sitting) Pt Will Transfer to Toilet: with mod assist;with +2 assist;bedside commode;stand pivot transfer Additional ADL Goal #1: Pt will perform bed mobility with Mod A +2 in preparation for ADLs Additional ADL Goal #2: Pt will maintain sitting upright with Min A in preparation for ADLs in sitting  OT Frequency: Min 2X/week   Barriers to  D/C:            Co-evaluation PT/OT/SLP Co-Evaluation/Treatment: Yes Reason for Co-Treatment: Complexity of the patient's impairments (multi-system involvement);For patient/therapist safety;To address functional/ADL  transfers   OT goals addressed during session: ADL's and self-care      AM-PAC PT "6 Clicks" Daily Activity     Outcome Measure Help from another person eating meals?: Total Help from another person taking care of personal grooming?: Total Help from another person toileting, which includes using toliet, bedpan, or urinal?: Total Help from another person bathing (including washing, rinsing, drying)?: Total Help from another person to put on and taking off regular upper body clothing?: Total Help from another person to put on and taking off regular lower body clothing?: Total 6 Click Score: 6   End of Session Equipment Utilized During Treatment: Other (comment)(Maxisky) Nurse Communication: Mobility status;Need for lift equipment  Activity Tolerance: Patient limited by fatigue Patient left: in chair;with call bell/phone within reach;with chair alarm set  OT Visit Diagnosis: Unsteadiness on feet (R26.81);Other abnormalities of gait and mobility (R26.89);Muscle weakness (generalized) (M62.81);Other symptoms and signs involving cognitive function                Time: 1121-1156 OT Time Calculation (min): 35 min Charges:  OT General Charges $OT Visit: 1 Visit OT Evaluation $OT Eval Moderate Complexity: 1 Mod G-Codes:     Moffat, OTR/L Acute Rehab Pager: [phone] Office: [place] 01/19/2018, 12:35 PM

## 2018-01-19 NOTE — Evaluation (Signed)
Clinical/Bedside Swallow Evaluation Patient Details  Name: [name] MRN: [id number] Date of Birth: [birth year]  Today's Date: 01/19/2018 Time: SLP Start Time (ACUTE ONLY): 0910 SLP Stop Time (ACUTE ONLY): 0935 SLP Time Calculation (min) (ACUTE ONLY): 25 min  Past Medical History:  Past Medical History:  Diagnosis Date  . Atrial fibrillation ([place])   . Cancer ([place])    colon 2010  . Chronic anticoagulation   . Diabetes mellitus without complication ([place])   . Hypertension    Past Surgical History:  Past Surgical History:  Procedure Laterality Date  . COLOSTOMY     HPI:  [name] is a 72 y.o. male with a history of a fib, stage 3 chron c kidney disease, DM2, adenocarcinoma of colon s/p resection and current colostomy, history of renal cell carcinoma s/o resection.  Pt admitted on 12/20/17, initially found to have. MRI stable right greater than left subdural hematomas since the head CT The right side subdural demonstrates greater lobulation and signal heterogeneity measuring between 8 and 17 mm in thickness. The Stable mild intracranial mass effect with trace leftward midline shift. Normal basilar cisterns. Trace intraventricular hemorrhage with no ventriculomegaly. MBS on 4/1 showed moderate to severe dysphagia with silent aspiration of nectar and thin, severe residue with thicker consistencies due to weakness, eventual aspiration, weak cough. Pt intubated on 4/2 to 4/7 and reevalauted at bedside on 4/8. Pt remained lethargic and poorly responsive to PO trials until 4/9 when he was reintubated until 4/21.    Assessment / Plan / Recommendation Clinical Impression  Pt demonstrates ongoing oral dysphagia given left CN VII and XII weakness. Pt is fully aware of PO and able to sustain arousal during assessment which is an improvement. Vocal quality also relatively clear despite second prolonged intubation, though breath support for volume and coughing low. Trials of PO result in swallow  response, but there is ongoing concern for aspiration and residue as seen on prior evals (weak dealyed cough, mutliple swallows). Pt would benefit from immediate placement of temporary means of nutrition given critical need for a consistent method of medication administration. Will continue work up for dysphagia throughout the week with good prognosis for return of oral intake over the short term. May procced with MBS tomorrow if pt remains stable. (Would benefit from larger fluoro chair given body habitus). SLP Visit Diagnosis: Dysphagia, oropharyngeal phase (R13.12)    Aspiration Risk  Moderate aspiration risk;Severe aspiration risk    Diet Recommendation NPO;Alternative means - temporary        Other  Recommendations     Follow up Recommendations Inpatient Rehab      Frequency and Duration min 2x/week  2 weeks       Prognosis Prognosis for Safe Diet Advancement: Good      Swallow Study   General HPI: [name] is a 72 y.o. male with a history of a fib, stage 3 chron c kidney disease, DM2, adenocarcinoma of colon s/p resection and current colostomy, history of renal cell carcinoma s/o resection.  Pt admitted on 12/20/17, initially found to have. MRI stable right greater than left subdural hematomas since the head CT The right side subdural demonstrates greater lobulation and signal heterogeneity measuring between 8 and 17 mm in thickness. The Stable mild intracranial mass effect with trace leftward midline shift. Normal basilar cisterns. Trace intraventricular hemorrhage with no ventriculomegaly. MBS on 4/1 showed moderate to severe dysphagia with silent aspiration of nectar and thin, severe residue with thicker consistencies due  to weakness, eventual aspiration, weak cough. Pt intubated on 4/2 to 4/7 and reevalauted at bedside on 4/8. Pt remained lethargic and poorly responsive to PO trials until 4/9 when he was reintubated until 4/21.  Type of Study: Bedside Swallow  Evaluation Previous Swallow Assessment: see impression statement Diet Prior to this Study: NPO Temperature Spikes Noted: No Respiratory Status: Nasal cannula History of Recent Intubation: No Behavior/Cognition: Alert;Cooperative;Requires cueing Oral Cavity Assessment: Within Functional Limits Oral Care Completed by SLP: Recent completion by staff Oral Cavity - Dentition: Poor condition Vision: Functional for self-feeding Self-Feeding Abilities: Total assist Patient Positioning: Upright in bed Baseline Vocal Quality: Low vocal intensity Volitional Cough: Cognitively unable to elicit Volitional Swallow: Unable to elicit    Oral/Motor/Sensory Function Overall Oral Motor/Sensory Function: Moderate impairment Facial ROM: Reduced left;Suspected CN VII (facial) dysfunction Facial Symmetry: Abnormal symmetry left;Suspected CN VII (facial) dysfunction Facial Strength: Reduced left;Suspected CN VII (facial) dysfunction Lingual ROM: Reduced left;Suspected CN XII (hypoglossal) dysfunction Lingual Symmetry: Abnormal symmetry left;Suspected CN XII (hypoglossal) dysfunction Lingual Strength: Reduced;Suspected CN XII (hypoglossal) dysfunction   Ice Chips Ice chips: Impaired Presentation: Spoon Oral Phase Impairments: Reduced lingual movement/coordination;Reduced labial seal Oral Phase Functional Implications: Left lateral sulci pocketing   Thin Liquid Thin Liquid: Impaired Presentation: Cup Oral Phase Impairments: Reduced labial seal Oral Phase Functional Implications: Left anterior spillage;Left lateral sulci pocketing Pharyngeal  Phase Impairments: Multiple swallows;Suspected delayed Swallow;Cough - Immediate    Nectar Thick Nectar Thick Liquid: Not tested   Honey Thick Honey Thick Liquid: Not tested   Puree Puree: Impaired Pharyngeal Phase Impairments: Multiple swallows;Decreased hyoid-laryngeal movement   Solid   GO   Solid: Not tested       Herbie Baltimore, MA CCC-SLP  [phone]  Lynann Beaver 01/19/2018,9:54 AM

## 2018-01-19 NOTE — Evaluation (Signed)
Physical Therapy Evaluation Patient Details Name: [name] MRN: [id number] DOB: October 09, 1945 Today's Date: 01/19/2018   History of Present Illness  72 y.o. male with a history of a fib, stage 3 chron c kidney disease, DM2, adenocarcinoma of colon s/p resection and current colostomy, history of renal cell carcinoma s/o resection.  Pt admitted on 12/20/17, initially found to have worsening shortness of breath. Few hours after admission patient became confused and agitated pulling out IV line. MRI stable right greater than left subdural hematomas since the head CT The right side subdural demonstrates greater lobulation and signal heterogeneity measuring between 8 and 17 mm in thickness. The Stable mild intracranial mass effect with trace leftward midline shift. Normal basilar cisterns. Trace intraventricular hemorrhage with no ventriculomegaly.   Clinical Impression  Orders received for PT evaluation. Patient demonstrates deficits in functional mobility as indicated below. Will benefit from continued skilled PT to address deficits and maximize function. Will see as indicated and progress as tolerated.  Patient total assist for all aspects of mobility throughout session. Required lift OOB to chair, limited ability to follow commands at this time. Given deficits and limited ability to participate recommending SNF upon acute discharge but will follow progress, if improvements in activity tolerance and participation improve then may consider CIR.    Follow Up Recommendations SNF    Equipment Recommendations  (TBD)    Recommendations for Other Services       Precautions / Restrictions Precautions Precautions: Fall Restrictions Weight Bearing Restrictions: No      Mobility  Bed Mobility Overal bed mobility: Needs Assistance Bed Mobility: Rolling;Supine to Sit;Sit to Supine Rolling: Total assist;+2 for physical assistance   Supine to sit: Total assist;+2 for physical assistance Sit to  supine: Total assist;+2 for physical assistance   General bed mobility comments: Total A to bring BLEs towards EOB and levate trunk into sitting. Pt requiring Total A to roll side to side during placement of lift pad.   Transfers Overall transfer level: Needs assistance               General transfer comment: Maxi sky utilized to transfer pt to recliner. Pt requiring Total A to roll side to side during placement of pad a bed level. Pt able to maintain upright head position during transition  Ambulation/Gait                Stairs            Wheelchair Mobility    Modified Rankin (Stroke Patients Only) Modified Rankin (Stroke Patients Only) Pre-Morbid Rankin Score: No symptoms Modified Rankin: Severe disability     Balance Overall balance assessment: Needs assistance Sitting-balance support: Feet supported Sitting balance-Leahy Scale: Zero Sitting balance - Comments: Pt requiring Total A to maintain sitting at EOB with bilateral knees blocked                                     Pertinent Vitals/Pain Pain Assessment: Faces Faces Pain Scale: Hurts a little bit Pain Intervention(s): Monitored during session    Home Living Family/patient expects to be discharged to:: Private residence Living Arrangements: Spouse/significant other(Wife) Available Help at Discharge: Family Type of Home: House Home Access: Stairs to enter     Home Layout: One level   Additional Comments: questionable historian    Prior Function           Comments: No family present  to provide PLOF     Hand Dominance   Dominant Hand: Right    Extremity/Trunk Assessment   Upper Extremity Assessment Upper Extremity Assessment: RUE deficits/detail;LUE deficits/detail RUE Deficits / Details: Poor grasp strength and poor initation of movement. No active movement of shoulder, elbow, and wrist.  RUE Coordination: decreased fine motor;decreased gross motor LUE Deficits  / Details: Poor grasp strength and poor initation of movement. No active movement of shoulder, elbow, and wrist.  LUE Coordination: decreased fine motor;decreased gross motor    Lower Extremity Assessment Lower Extremity Assessment: Defer to PT evaluation       Communication   Communication: Other (comment);Expressive difficulties(Slow processing)  Cognition Arousal/Alertness: Awake/alert Behavior During Therapy: Flat affect Overall Cognitive Status: No family/caregiver present to determine baseline cognitive functioning Area of Impairment: Attention;Following commands;Safety/judgement;Awareness;Problem solving                   Current Attention Level: Focused   Following Commands: Follows one step commands inconsistently Safety/Judgement: Decreased awareness of safety;Decreased awareness of deficits Awareness: Intellectual Problem Solving: Slow processing;Decreased initiation;Difficulty sequencing;Requires verbal cues;Requires tactile cues General Comments: Inconsistantly answering simple, direct question about home. Requiring max tactile, verbal, and visual cues to participate in ADLs and mobility. During simple commands, pt would make eye contact and state "yes" in agreement to command but not follow commands.       General Comments      Exercises     Assessment/Plan    PT Assessment Patient needs continued PT services  PT Problem List Decreased strength;Decreased activity tolerance;Decreased balance;Decreased mobility;Decreased coordination;Decreased cognition;Impaired sensation;Impaired tone;Obesity       PT Treatment Interventions Functional mobility training;Therapeutic activities;Therapeutic exercise;Balance training;Neuromuscular re-education;Cognitive remediation;Patient/family education    PT Goals (Current goals can be found in the Care Plan section)  Acute Rehab PT Goals Patient Stated Goal: Unstated PT Goal Formulation: Patient unable to participate  in goal setting Time For Goal Achievement: 02-04-18 Potential to Achieve Goals: Fair    Frequency Min 3X/week   Barriers to discharge        Co-evaluation PT/OT/SLP Co-Evaluation/Treatment: Yes Reason for Co-Treatment: Complexity of the patient's impairments (multi-system involvement);For patient/therapist safety;To address functional/ADL transfers PT goals addressed during session: Mobility/safety with mobility;Balance OT goals addressed during session: ADL's and self-care       AM-PAC PT "6 Clicks" Daily Activity  Outcome Measure Difficulty turning over in bed (including adjusting bedclothes, sheets and blankets)?: Unable Difficulty moving from lying on back to sitting on the side of the bed? : Unable Difficulty sitting down on and standing up from a chair with arms (e.g., wheelchair, bedside commode, etc,.)?: Unable Help needed moving to and from a bed to chair (including a wheelchair)?: Total Help needed walking in hospital room?: Total Help needed climbing 3-5 steps with a railing? : Total 6 Click Score: 6    End of Session   Activity Tolerance: Patient limited by fatigue Patient left: in chair;with call bell/phone within reach;with chair alarm set Nurse Communication: Mobility status;Need for lift equipment PT Visit Diagnosis: Other symptoms and signs involving the nervous system (R29.898);Hemiplegia and hemiparesis    Time: 1121-1156 PT Time Calculation (min) (ACUTE ONLY): 35 min   Charges:   PT Evaluation $PT Eval Moderate Complexity: 1 Mod PT Treatments $Therapeutic Activity: 8-22 mins   PT G Codes:        Alben Deeds, PT DPT  Board Certified Neurologic Specialist [place] 01/19/2018, 2:45 PM

## 2018-01-19 NOTE — Evaluation (Signed)
Speech Language Pathology Evaluation Patient Details Name: [name] MRN: [id number] DOB: December 19, 1945 Today's Date: 01/19/2018 Time: 9381-0175 SLP Time Calculation (min) (ACUTE ONLY): 25 min  Problem List:  Patient Active Problem List   Diagnosis Date Noted  . Acute encephalopathy   . Palliative care encounter   . Palliative care by specialist   . Hypertensive emergency   . Acute congestive heart failure ([place])   . Acute respiratory failure with hypoxia ([place])   . Acute diastolic (congestive) heart failure ([place])   . Subdural hemorrhage ([place])   . Acute metabolic encephalopathy   . Thyrotoxicosis with thyrotoxic crisis   . Atrial fibrillation with RVR ([place]) 12/20/2017  . History of kidney cancer 12/10/2017  . History of colon cancer 12/09/2017  . Stage 3 chronic kidney disease ([place]) 12/11/2017  . Acquired solitary kidney 12/25/2017  . Supratherapeutic INR 12/22/2017  . Heme positive stool 11/28/2017  . Acute systolic CHF (congestive heart failure) ([place]) 12/01/2017  . Elevated troponin 12/25/2017  . Hypertension   . Diabetes mellitus without complication ([place])   . Atrial fibrillation ([place])   . Chronic anticoagulation    Past Medical History:  Past Medical History:  Diagnosis Date  . Atrial fibrillation ([place])   . Cancer ([place])    colon 2010  . Chronic anticoagulation   . Diabetes mellitus without complication ([place])   . Hypertension    Past Surgical History:  Past Surgical History:  Procedure Laterality Date  . COLOSTOMY     HPI:  [name] is a 72 y.o. male with a history of a fib, stage 3 chron c kidney disease, DM2, adenocarcinoma of colon s/p resection and current colostomy, history of renal cell carcinoma s/o resection.  Pt admitted on 12/20/17, initially found to have. MRI stable right greater than left subdural hematomas since the head CT The right side subdural demonstrates greater lobulation and signal heterogeneity measuring between 8 and 17 mm in  thickness. The Stable mild intracranial mass effect with trace leftward midline shift. Normal basilar cisterns. Trace intraventricular hemorrhage with no ventriculomegaly. MBS on 4/1 showed moderate to severe dysphagia with silent aspiration of nectar and thin, severe residue with thicker consistencies due to weakness, eventual aspiration, weak cough. Pt intubated on 4/2 to 4/7 and reevalauted at bedside on 4/8. Pt remained lethargic and poorly responsive to PO trials until 4/9 when he was reintubated until 4/21.    Assessment / Plan / Recommendation Clinical Impression  Pt demonstrates cogntive deficits and profound weakness following SDH and IVH and prolonged ICU stay. Pt is moderately dysarthric with L sided CN VII and XII weakness, though intelligibility improved with verbal cues for increased volume. Attention is focused to briefly sustained to basic verbal and functional tasks; pt is able to follow one step commands with repetition and visual cues. Oriented to self only. Will continue interventions for cognitive rehabilitation.     SLP Assessment  SLP Recommendation/Assessment: Patient needs continued Speech Lanaguage Pathology Services SLP Visit Diagnosis: Attention and concentration deficit Attention and concentration deficit following: Nontraumatic intracerebral hemorrhage    Follow Up Recommendations  Inpatient Rehab    Frequency and Duration min 2x/week  2 weeks      SLP Evaluation Cognition  Overall Cognitive Status: Impaired/Different from baseline Arousal/Alertness: Awake/alert Orientation Level: Oriented to person;Disoriented to place;Disoriented to time;Disoriented to situation Attention: Focused Focused Attention: Appears intact Memory: Impaired Memory Impairment: Storage deficit;Decreased short term memory Decreased Short Term Memory: Verbal basic;Functional basic Awareness: Impaired Awareness Impairment: Intellectual  impairment;Emergent impairment Problem Solving:  Impaired Problem Solving Impairment: Verbal basic;Functional basic Safety/Judgment: Impaired       Comprehension  Auditory Comprehension Overall Auditory Comprehension: Impaired Yes/No Questions: Not tested Commands: Impaired One Step Basic Commands: 75-100% accurate Conversation: Simple Interfering Components: Attention;Working Camera operator Expression Overall Verbal Expression: Appears within functional limits for tasks assessed   Oral / Motor  Oral Motor/Sensory Function Overall Oral Motor/Sensory Function: Moderate impairment Facial ROM: Reduced left;Suspected CN VII (facial) dysfunction Facial Symmetry: Abnormal symmetry left;Suspected CN VII (facial) dysfunction Facial Strength: Reduced left;Suspected CN VII (facial) dysfunction Lingual ROM: Reduced left;Suspected CN XII (hypoglossal) dysfunction Lingual Symmetry: Abnormal symmetry left;Suspected CN XII (hypoglossal) dysfunction Lingual Strength: Reduced;Suspected CN XII (hypoglossal) dysfunction Motor Speech Overall Motor Speech: Impaired Respiration: Impaired Level of Impairment: Word Phonation: Low vocal intensity Articulation: Impaired Level of Impairment: Word Intelligibility: Intelligibility reduced Word: 50-74% accurate Phrase: 50-74% accurate Sentence: Not tested Conversation: Not tested Motor Speech Errors: Unaware;Consistent   GO                   Herbie [place], MA CCC-SLP [phone]  Lynann Beaver 01/19/2018, 10:04 AM

## 2018-01-19 NOTE — Progress Notes (Addendum)
PULMONARY / CRITICAL CARE MEDICINE   Name: [name] MRN: [id number] DOB: 1946-01-09    ADMISSION DATE:  12/21/2017 CONSULTATION DATE:  12/30/17  REFERRING MD:  Dr. Karie Kirks   CHIEF COMPLAINT:  SOB  BRIEF SUMMARY:   72 year old man with diabetes, chronic atrial fibrillation, thyrotoxicosis.  He was admitted for dyspnea and pulmonary edema, course complicated by encephalopathy and bilatearl subdural hematoma.  He had a reassuring LP.  He required mechanical ventilation for airway protection.  He has had bouts of atrial fibrillation with RVR, hypernatremia.   SUBJECTIVE:  RN reports pt not progressing with swallowing efforts.  N/V overnight requiring phenergan.   VITAL SIGNS: BP (!) 180/88   Pulse (!) 131   Temp 98.7 F (37.1 C) (Axillary)   Resp 19   Ht 5\' 9"  (1.753 m)   Wt 243 lb 6.2 oz (110.4 kg)   SpO2 97%   BMI 35.94 kg/m   HEMODYNAMICS:    VENTILATOR SETTINGS: Vent Mode: CPAP;PSV FiO2 (%):  [40 %] 40 % Set Rate:  [14 bmp] 14 bmp Vt Set:  [570 mL] 570 mL PEEP:  [5 cmH20] 5 cmH20 Pressure Support:  [5 cmH20] 5 cmH20  INTAKE / OUTPUT: I/O last 3 completed shifts: In: 3736.8 [I.V.:2076.8; NG/GT:1260; IV Piggyback:400] Out: 2694 [Urine:4650; Stool:675]  PHYSICAL EXAMINATION: General:  Elderly male in NAD, lying in bed, disheveled appearance   Neuro:  Awake, alert to name, place.  Otherwise confused, MAE HEENT:  MM pink/dry, poor dentition  Cardiovascular:  s1s2 irr irr, tachy 120's Lungs:  Even/non-labored on RA  Abdomen:  Obese/soft, bsx4 active, colostomy  Musculoskeletal:  No acute deformities  Skin:  Warm/dry, no edema   LABS:  BMET Recent Labs  Lab 01/17/18 2032 01/18/18 0528 01/19/18 0515  NA 136 138 137  K 3.8 4.1 3.7  CL 105 107 105  CO2 24 24 25   BUN 60* 57* 31*  CREATININE 0.95 0.88 0.86  GLUCOSE 238* 251* 263*    Electrolytes Recent Labs  Lab 01/16/18 0532 01/17/18 0845 01/17/18 2032 01/18/18 0528 01/19/18 0515  CALCIUM 10.2  9.6 9.4 9.4 10.0  MG 1.8 1.8  --  1.8 1.8  PHOS 3.7  --   --  2.9 2.7    CBC Recent Labs  Lab 01/17/18 0500 01/18/18 0528 01/19/18 0515  WBC 10.8* 11.6* 22.2*  HGB 9.1* 8.8* 10.5*  HCT 30.0* 28.4* 33.3*  PLT 133* 114* 174    Coag's Recent Labs  Lab 01/15/18 0439  INR 1.33    Sepsis Markers No results for input(s): LATICACIDVEN, PROCALCITON, O2SATVEN in the last 168 hours.  ABG Recent Labs  Lab 01/15/18 0405 01/16/18 0333 01/17/18 0435  PHART 7.366 7.374 7.412  PCO2ART 48.5* 46.1 43.4  PO2ART 116* 115* 150*    Liver Enzymes Recent Labs  Lab 01/15/18 0439 01/16/18 0532 01/17/18 0845  AST 18 47* 24  ALT 144* 187* 186*  ALKPHOS 86 88 84  BILITOT 0.7 0.7 0.7  ALBUMIN 1.9* 1.8* 1.9*    Cardiac Enzymes No results for input(s): TROPONINI, PROBNP in the last 168 hours.  Glucose Recent Labs  Lab 01/18/18 0732 01/18/18 1126 01/18/18 1552 01/18/18 2004 01/19/18 0010 01/19/18 0406  GLUCAP 213* 171* 88 135* 173* 244*    Imaging Dg Chest Port 1 View  Result Date: 01/19/2018 CLINICAL DATA:  Respiratory failure EXAM: PORTABLE CHEST 1 VIEW COMPARISON:  01/18/2018 FINDINGS: Endotracheal tube and NG tube removed. Right arm PICC tip in the SVC Mild bibasilar airspace  disease, with improvement on the left since the prior study. No effusion or edema. IMPRESSION: Endotracheal tube removed. Improved aeration left lower lobe. Mild right lower lobe airspace disease unchanged. Electronically Signed   By: Franchot Gallo M.D.   On: 01/19/2018 07:06   Dg Abd Portable 1v  Result Date: 01/19/2018 CLINICAL DATA:  Vomiting EXAM: PORTABLE ABDOMEN - 1 VIEW COMPARISON:  None. FINDINGS: The bowel gas pattern is normal. No radio-opaque calculi or other significant radiographic abnormality are seen. IMPRESSION: Negative. Electronically Signed   By: Ulyses Jarred M.D.   On: 01/19/2018 05:11     STUDIES:  CT Head 3/31 >> bilateral SDH's, R>L, minimal mass effect without significant  midline shift MRI Brain 4/1 >> stable SDH's, trace leftward midline shift US Thyroid 4/4 >> 1.5 cm R TR 4 nodule, 1.4 cm left inferior T3 nodule EEG 4/7 >> consistent with mild generalized non-specific cerebral dysfunction.  No seizure or seizure predisposition recorded on this study.  ECHO 4/7 >> limited study to evaluate RV, normal LV function, severe bi-atrial enlargement, unable to estimate pulmonary pressure EEG 4/11 >> sedated EEG is abnormal with moderate diffuse background slowing LP 4/15 >> clear fluid, glucose 117, protein 74, RBC 1 CT Head 4/10 >> right hemispheric SDH appears stable, no new areas of ICH, edema or mass effect   CULTURES: BCx2 3/29 >> coag neg staph 1/2 bottles  BCx2 4/1 >> negative  Sputum 4/9 >> Klebsiella pneumoniae >> R-ampicillin, otherwise sensitive  BCx2 4/6 >> negative  BCx2 4/9 >> coag neg staph 2/2  CSF 4/15 >> negative  CSF Fungal 4/15 >>   ANTIBIOTICS: Zosyn 3/30>> 4/5, restart 4/6 Vanc 3/31>>>4/3  SIGNIFICANT EVENTS: 3/29  Admit  4/02  Tx to ICU for resp fx, AMS, AFwRVR, intubated 4/05  Extubated, tx out of ICU 4/06  Returned to ICU, bipap 4/15  LP 4/21  Extubated  LINES/TUBES: RUE PICC 3/30 >>  ETT 4/2 >> 4/5 ETT 4/9 >> 4/21  DISCUSSION: 72 year old with DM II, chronic atrial fibrillation (previously on anticoagulation), CKD III who was initially admitted with dyspnea on exertion.  He was treated for CHF and found to have thyrotoxicosis which was treated.  He subsequently has had intermittent seizures and depressed mental status.  Found to have bilateral R>L SDH.  MRI with concern for enhancement of the meninges > LP performed.  4/21 much more awake down sedation has been held, extubated.    ASSESSMENT / PLAN:  PULMONARY A: Acute Hypoxic Respiratory Failure - resolved At Risk Aspiration - prolonged intubation, critical illness  Klebsiella Pneumoniae Aspiration PNA  P:   Aspiration precautions SLP evaluation appreciated Duoneb  Q6  Solumedrol 20 mg Q12, wean to off  Monitor off abx  Intermittent CXR  CARDIOVASCULAR A:  Atrial Fibrillation - not a candidate for anticoagulation due to SDH CHF - on admit Hx HTN P:  ICU monitoring  Continue cardizem, amiodarone  PRN lopressor  RENAL A:   CKD III  Hypernatremia  Hx Renal Cell Carcinoma s/p Resection P:   Trend BMP / urinary output Replace electrolytes as indicated Avoid nephrotoxic agents, ensure adequate renal perfusion  GASTROINTESTINAL A:   Vomiting - early am 4/22 Hx Adenocarcinoma s/p Resection with Colostomy - 2010 P:   NPO  SLP efforts  Place coretrak to have consistent access for medications.  Anticipate he will take a while with swallowing PRN antiemetics for vomiting  Begin trickle TF once cortrak in place   HEMATOLOGIC A:   Anemia  P:  Trend CBC  SCD's for DVT prophylaxis   INFECTIOUS A:   R/O Aspiration PNA - 4/22  P:   Follow WBC trend / fever curve  Monitor CXR   ENDOCRINE A:   Thyrotoxicosis  DM with Hyperglycemia    P:   Continue methimazole 20 mg PT Q6  Assess follow up TSH in am   NEUROLOGIC A:   Seizures  Bilateral SDH - R>L Acute Metabolic Encephalopathy  P:   RASS goal: n/a PT efforts  Promote sleep / wake cycle  Continue Keppra Ativan PRN seizure  FAMILY  - Updates:  No family at bedside.  Plan of care coordinated with RN.   - Global: consider transfer to SDU 4/23  Noe Gens, NP-C [facility] Pgr: [phone] or if no answer [phone] 01/19/2018, 8:17 AM

## 2018-01-19 NOTE — Progress Notes (Addendum)
Nutrition Follow-up  DOCUMENTATION CODES:   Obesity unspecified  INTERVENTION:   Trickle tube feeding via Cortrak: Vital 1.5 @ 20 ml/hr (240 ml/day) Provides 720 kcal (33% estimated energy needs), 32 grams protein (28% estimated protein needs), and 182 ml free water.  Continue liquid MVI daily.  If pt tolerates trickle tube feeds, recommend advancing Vital 1.5 to goal rate of 60 ml/hr (1440 ml/day) with 30 ml Pro-Stat BID. Goal TF regimen provides 2360 kcal (100% estimated energy needs), 127 grams protein (100% estimated protein needs), and 1094 ml free water.  NUTRITION DIAGNOSIS:   Inadequate oral intake related to acute illness as evidenced by NPO status.  Ongoing  GOAL:   Patient will meet greater than or equal to 90% of their needs  Unmet at this time, will be met once TF reaches goal rate  MONITOR:   TF tolerance, Labs, Weight trends, Skin  REASON FOR ASSESSMENT:   Consult, Ventilator Enteral/tube feeding initiation and management  ASSESSMENT:    72 yo male admitted with acute respiratory failure with acute pulmonary edem and possible aspiration pneumonia; pt also with acute metabolic encephalopathy, bilateral subdural hematomas, seizures and thyroid storm. Pt with hx of CKD III, DM, adenocarcinoma of colon s/p resection and colostomy, renal cell carcinoma   12/30/17 - intubated 01/01/18 - extubated 01/06/18 - re-intubated 01/18/18 - extubated  Pt discussed with RN. Pt resting in chair at time of visit.  Cortrak placed this morning by this RD. SLP recommended NPO after bedside swallow evaluation.  Per NP, start trickle TF once Cortrak in place. Pt experienced vomiting last night. Spoke with NP who reported that if pt tolerates trickle TF, RD can advance to goal rate tomorrow.  Pt receiving Vital High Protein @ 20 ml/hr via Cortrak at time of RD visit.  Noted weight loss of 14 lbs since last RD visit on 01/14/18 (257 lbs to 243 lbs today).  UOP: 3500 ml x 24  hours  Medications reviewed and include: sliding scale Novolog, liquid MVI Drips: Amiodarone @ 30 mg/hr Keppra  Labs reviewed: BUN 31 (H), hemoglobin 10.5 (L), HCT 33.3 (L) CBG's: 238, 244, 173, 135, 88 x 24 hours  Diet Order:  Diet NPO time specified  EDUCATION NEEDS:   Not appropriate for education at this time  Skin:  Skin Assessment: Reviewed RN Assessment  Last BM:  175 ml x 24 hours via colostomy  Height:   Ht Readings from Last 1 Encounters:  01/16/18 '5\' 9"'$  (1.753 m)    Weight:   Wt Readings from Last 1 Encounters:  01/19/18 243 lb 6.2 oz (110.4 kg)    Ideal Body Weight:  86 kg  BMI:  Body mass index is 35.94 kg/m.  Estimated Nutritional Needs:   Kcal:  2200-2400 kcal/day (MSJ x 1.2-1.3)  Protein:  115-130 grams/day  Fluid:  >/= 2 L    Gaynell Face, MS, RD, LDN Pager: [phone] Weekend/After Hours: [phone]

## 2018-01-19 NOTE — Progress Notes (Signed)
Cortrak Tube Team Note:  Consult received to place a Cortrak feeding tube.   A 10 F Cortrak tube was placed in the R nare and secured with a nasal bridle at 95 cm. Per the Cortrak monitor reading the tube tip is post-pyloric.   No x-ray is required. RN may begin using tube.   If the tube becomes dislodged please keep the tube and contact the Cortrak team at [email or web address] (password [email or web address]) for replacement.  If after hours and replacement cannot be delayed, place a NG tube and confirm placement with an abdominal x-ray.    Gaynell Face, MS, RD, LDN Pager: [phone] Weekend/After Hours: [phone]

## 2018-01-20 ENCOUNTER — Inpatient Hospital Stay (HOSPITAL_COMMUNITY): Payer: Medicare Other

## 2018-01-20 LAB — BASIC METABOLIC PANEL
Anion gap: 10 (ref 5–15)
BUN: 22 mg/dL — ABNORMAL HIGH (ref 6–20)
CALCIUM: 9.9 mg/dL (ref 8.9–10.3)
CHLORIDE: 106 mmol/L (ref 101–111)
CO2: 25 mmol/L (ref 22–32)
CREATININE: 0.81 mg/dL (ref 0.61–1.24)
GFR calc Af Amer: >60 mL/min (ref >60)
GFR calc non Af Amer: >60 mL/min (ref >60)
GLUCOSE: 201 mg/dL — AB (ref 65–99)
Potassium: 3.8 mmol/L (ref 3.5–5.1)
Sodium: 141 mmol/L (ref 135–145)

## 2018-01-20 LAB — GLUCOSE, CAPILLARY
GLUCOSE-CAPILLARY: 168 mg/dL — AB (ref 65–99)
GLUCOSE-CAPILLARY: 198 mg/dL — AB (ref 65–99)
Glucose-Capillary: 154 mg/dL — ABNORMAL HIGH (ref 65–99)
Glucose-Capillary: 161 mg/dL — ABNORMAL HIGH (ref 65–99)
Glucose-Capillary: 162 mg/dL — ABNORMAL HIGH (ref 65–99)
Glucose-Capillary: 203 mg/dL — ABNORMAL HIGH (ref 65–99)

## 2018-01-20 LAB — CBC
HCT: 34.8 % — ABNORMAL LOW (ref 39.0–52.0)
HEMOGLOBIN: 10.9 g/dL — AB (ref 13.0–17.0)
MCH: 27 pg (ref 26.0–34.0)
MCHC: 31.3 g/dL (ref 30.0–36.0)
MCV: 86.4 fL (ref 78.0–100.0)
Platelets: 146 10*3/uL — ABNORMAL LOW (ref 150–400)
RBC: 4.03 MIL/uL — ABNORMAL LOW (ref 4.22–5.81)
RDW: 19.2 % — ABNORMAL HIGH (ref 11.5–15.5)
WBC: 22.7 10*3/uL — ABNORMAL HIGH (ref 4.0–10.5)

## 2018-01-20 LAB — PROCALCITONIN: Procalcitonin: <0.1 ng/mL

## 2018-01-20 LAB — AMYLASE: Amylase: 66 U/L (ref 28–100)

## 2018-01-20 LAB — TSH: TSH: 0.012 u[IU]/mL — AB (ref 0.350–4.500)

## 2018-01-20 MED ORDER — METOCLOPRAMIDE HCL 5 MG/ML IJ SOLN
5.0000 mg | Freq: Four times a day (QID) | INTRAMUSCULAR | Status: AC
  Administered 2018-01-20 – 2018-01-21 (×4): 5 mg via INTRAVENOUS
  Filled 2018-01-20 (×4): qty 2

## 2018-01-20 MED ORDER — IODINE STRONG (LUGOLS) 5 % PO SOLN
0.2000 mL | Freq: Three times a day (TID) | ORAL | Status: DC
  Administered 2018-01-20 – 2018-01-21 (×5): 0.2 mL via ORAL
  Filled 2018-01-20 (×6): qty 0.2

## 2018-01-20 MED ORDER — ESMOLOL HCL-SODIUM CHLORIDE 2000 MG/100ML IV SOLN
50.0000 ug/kg/min | INTRAVENOUS | Status: AC
Start: 2018-01-20 — End: 2018-01-23
  Administered 2018-01-20: 50 ug/kg/min via INTRAVENOUS
  Administered 2018-01-20: 25 ug/kg/min via INTRAVENOUS
  Administered 2018-01-20: 50 ug/kg/min via INTRAVENOUS
  Administered 2018-01-21: 25 ug/kg/min via INTRAVENOUS
  Administered 2018-01-21 – 2018-01-22 (×3): 50 ug/kg/min via INTRAVENOUS
  Administered 2018-01-22 – 2018-01-23 (×2): 25 ug/kg/min via INTRAVENOUS
  Filled 2018-01-20 (×11): qty 100

## 2018-01-20 MED ORDER — CHLORHEXIDINE GLUCONATE CLOTH 2 % EX PADS
6.0000 | MEDICATED_PAD | Freq: Every day | CUTANEOUS | Status: DC
  Administered 2018-01-20 – 2018-02-01 (×11): 6 via TOPICAL

## 2018-01-20 MED ORDER — IPRATROPIUM-ALBUTEROL 0.5-2.5 (3) MG/3ML IN SOLN
3.0000 mL | Freq: Three times a day (TID) | RESPIRATORY_TRACT | Status: DC
  Administered 2018-01-20 – 2018-01-25 (×18): 3 mL via RESPIRATORY_TRACT
  Filled 2018-01-20 (×18): qty 3

## 2018-01-20 NOTE — Progress Notes (Signed)
SLP Cancellation Note  Patient Details Name: TAJON MORING MRN: [id number] DOB: [birth year]   Cancelled treatment:       Reason Eval/Treat Not Completed: Medical issues which prohibited therapy. Heavy WOB, hypertensive. Not appropriate for swallowing assessment this am. Will f/u tomorrow.    Dontrey [place], Katherene Ponto 01/20/2018, 8:39 AM

## 2018-01-20 NOTE — Progress Notes (Addendum)
PULMONARY / CRITICAL CARE MEDICINE   Name: [name] MRN: [id number] DOB: 02-13-1946    ADMISSION DATE:  12/08/2017 CONSULTATION DATE:  12/30/17  REFERRING MD:  Dr. Karie Kirks   CHIEF COMPLAINT:  SOB  BRIEF SUMMARY:   72 year old man with diabetes, chronic atrial fibrillation, thyrotoxicosis.  He was admitted for dyspnea and pulmonary edema, course complicated by encephalopathy and bilatearl subdural hematoma.  He had a reassuring LP.  He required mechanical ventilation for airway protection.  He has had bouts of atrial fibrillation with RVR, hypernatremia.   SUBJECTIVE:  RN reports pt with increased secretions > NTS.  Nausea / vomiting overnight.  Afebrile / WBC 22.7.  Coretrak in place.  TF stopped overnight.   VITAL SIGNS: BP (!) 168/80   Pulse (!) 125   Temp 98.5 F (36.9 C) (Axillary)   Resp (!) 23   Ht 5\' 9"  (1.753 m)   Wt 246 lb 7.6 oz (111.8 kg)   SpO2 95%   BMI 36.40 kg/m   HEMODYNAMICS:    VENTILATOR SETTINGS: FiO2 (%):  [21 %] 21 %  INTAKE / OUTPUT: I/O last 3 completed shifts: In: 1828.6 [I.V.:1286; NG/GT:142.7; IV Piggyback:400] Out: 3500 [Urine:3375; Stool:125]  PHYSICAL EXAMINATION: General: elderly male lying in bed, family at bedside   HEENT: MM pink/dry, coretrak in place Neuro: Awake, alert, able to communicate needs, generalized weakness but moves extremities CV: s1s2 irr irr, no m/r/g PULM: even/non-labored, lungs bilaterally diminished but clear  GI: soft, non-tender, bsx4 active  Extremities: warm/dry, no edema  Skin: no rashes or lesions  LABS:  BMET Recent Labs  Lab 01/18/18 0528 01/19/18 0515 01/20/18 0549  NA 138 137 141  K 4.1 3.7 3.8  CL 107 105 106  CO2 24 25 25   BUN 57* 31* 22*  CREATININE 0.88 0.86 0.81  GLUCOSE 251* 263* 201*    Electrolytes Recent Labs  Lab 01/16/18 0532 01/17/18 0845  01/18/18 0528 01/19/18 0515 01/20/18 0549  CALCIUM 10.2 9.6   < > 9.4 10.0 9.9  MG 1.8 1.8  --  1.8 1.8  --   PHOS 3.7  --    --  2.9 2.7  --    < > = values in this interval not displayed.    CBC Recent Labs  Lab 01/18/18 0528 01/19/18 0515 01/20/18 0549  WBC 11.6* 22.2* 22.7*  HGB 8.8* 10.5* 10.9*  HCT 28.4* 33.3* 34.8*  PLT 114* 174 146*    Coag's Recent Labs  Lab 01/15/18 0439  INR 1.33    Sepsis Markers No results for input(s): LATICACIDVEN, PROCALCITON, O2SATVEN in the last 168 hours.  ABG Recent Labs  Lab 01/15/18 0405 01/16/18 0333 01/17/18 0435  PHART 7.366 7.374 7.412  PCO2ART 48.5* 46.1 43.4  PO2ART 116* 115* 150*    Liver Enzymes Recent Labs  Lab 01/15/18 0439 01/16/18 0532 01/17/18 0845  AST 18 47* 24  ALT 144* 187* 186*  ALKPHOS 86 88 84  BILITOT 0.7 0.7 0.7  ALBUMIN 1.9* 1.8* 1.9*    Cardiac Enzymes No results for input(s): TROPONINI, PROBNP in the last 168 hours.  Glucose Recent Labs  Lab 01/19/18 1202 01/19/18 1611 01/19/18 2017 01/19/18 2342 01/20/18 0338 01/20/18 0811  GLUCAP 222* 198* 142* 118* 168* 203*    Imaging Dg Abd 1 View  Result Date: 01/20/2018 CLINICAL DATA:  Nausea, feeding tube placement EXAM: ABDOMEN - 1 VIEW COMPARISON:  01/19/2018 FINDINGS: Feeding tube tip is in the 1st to 2nd portion of the duodenum.  Nonobstructive bowel gas pattern. IMPRESSION: Feeding tube tip in the 1st to 2nd portion of the duodenum. Electronically Signed   By: Rolm Baptise M.D.   On: 01/20/2018 07:43   Dg Chest Port 1 View  Result Date: 01/20/2018 CLINICAL DATA:  Nausea and vomiting. EXAM: PORTABLE CHEST 1 VIEW COMPARISON:  January 19, 2018 FINDINGS: Central catheter tip is in the superior vena cava. Feeding tube tip is below the diaphragm. No pneumothorax. There is mild left base atelectasis. There is no edema or consolidation. Heart is mildly enlarged with pulmonary vascularity within normal limits. No adenopathy. There is aortic atherosclerosis. IMPRESSION: And catheter positions as described without pneumothorax. Mild left base atelectasis. No edema or  consolidation. Stable cardiac prominence. There is aortic atherosclerosis. Aortic Atherosclerosis (ICD10-I70.0). Electronically Signed   By: Lowella Grip III M.D.   On: 01/20/2018 07:56     STUDIES:  CT Head 3/31 >> bilateral SDH's, R>L, minimal mass effect without significant midline shift MRI Brain 4/1 >> stable SDH's, trace leftward midline shift US Thyroid 4/4 >> 1.5 cm R TR 4 nodule, 1.4 cm left inferior T3 nodule EEG 4/7 >> consistent with mild generalized non-specific cerebral dysfunction.  No seizure or seizure predisposition recorded on this study.  ECHO 4/7 >> limited study to evaluate RV, normal LV function, severe bi-atrial enlargement, unable to estimate pulmonary pressure EEG 4/11 >> sedated EEG is abnormal with moderate diffuse background slowing LP 4/15 >> clear fluid, glucose 117, protein 74, RBC 1 CT Head 4/10 >> right hemispheric SDH appears stable, no new areas of ICH, edema or mass effect  CT Head 4/23 >>   CULTURES: BCx2 3/29 >> coag neg staph 1/2 bottles  BCx2 4/1 >> negative  Sputum 4/9 >> Klebsiella pneumoniae >> R-ampicillin, otherwise sensitive  BCx2 4/6 >> negative  BCx2 4/9 >> coag neg staph 2/2  CSF 4/15 >> negative  CSF Fungal 4/15 >>   ANTIBIOTICS: Zosyn 3/30>> 4/5, restart 4/6 Vanc 3/31>>>4/3  SIGNIFICANT EVENTS: 3/29  Admit  4/02  Tx to ICU for resp fx, AMS, AFwRVR, intubated 4/05  Extubated, tx out of ICU 4/06  Returned to ICU, bipap 4/15  LP 4/21  Extubated 4/22  N/V, cortrak placed  LINES/TUBES: RUE PICC 3/30 >>  ETT 4/2 >> 4/5 ETT 4/9 >> 4/21  DISCUSSION: 72 year old with DM II, chronic atrial fibrillation (previously on anticoagulation), CKD III who was initially admitted with dyspnea on exertion.  He was treated for CHF and found to have thyrotoxicosis which was treated.  He subsequently has had intermittent seizures and depressed mental status.  Found to have bilateral R>L SDH.  MRI with concern for enhancement of the meninges >  LP performed.  4/21 much more awake down sedation has been held, extubated.    ASSESSMENT / PLAN:  PULMONARY A: Acute Hypoxic Respiratory Failure - resolved At Risk Aspiration - prolonged intubation, critical illness  Klebsiella Pneumoniae Aspiration PNA  Former Smoker - quit ~ 2009, likely COPD P:   Aspiration precautions  SLP evaluation  O2 as needed for sats 88-95% Duoneb Q6 Discontinue solumedrol  Monitor off abx  Follow intermittent CXR  At risk reintubation > informed family   CARDIOVASCULAR A:  Atrial Fibrillation - not a candidate for anticoagulation due to SDH CHF - on admit Hx HTN P:  ICU monitoring  Continue cardizem Change amiodarone to Brevibloc > ? If amio further driving hypothyroidism PRN lopressor   RENAL A:   CKD III  Hypernatremia - resolved Hx Renal  Cell Carcinoma s/p Resection P:   Trend BMP / urinary output Replace electrolytes as indicated Avoid nephrotoxic agents, ensure adequate renal perfusion  GASTROINTESTINAL A:   Vomiting - early am 4/22, 4/23. KUB negative for ileus/SBO. ? gastroparesis Elevated LFT's - ? Methimazole, Amiodarone  Hx Adenocarcinoma s/p Resection with Colostomy - 2010 P:   NPO / Coretrak  SLP efforts  PRN zofran  Reglan 5mg  IV Q6 x4 doses  Follow LFT's  HEMATOLOGIC A:   Anemia  P:  Trend CBC SCD's for DVT prophylaxis     INFECTIOUS A:   R/O Aspiration PNA - 4/22, 4/23 with n/v P:   Follow WBC trend / fever curve  Monitor CXR for evolving aspiration  Trend PCT   ENDOCRINE A:   Thyrotoxicosis  DM with Hyperglycemia    P:   Methimazole 20 mg PT Q6 > essentially has not been getting as he has N/V SSI   NEUROLOGIC A:   Seizures  Bilateral SDH - R>L Acute Metabolic Encephalopathy  P:   RASS goal: n/a  Avoid sedating medications as able  PT efforts  Promote sleep / wake cycle  Continue Keppra  Ativan PRN seizures   FAMILY  - Updates:  Daughter updated at bedside 4/23.   - Global: ICU  status for respiratory / neuro monitoring.   Noe Gens, NP-C [facility] Pgr: [phone] or if no answer [phone] 01/20/2018, 8:25 AM

## 2018-01-21 ENCOUNTER — Inpatient Hospital Stay (HOSPITAL_COMMUNITY): Payer: Medicare Other

## 2018-01-21 LAB — GLUCOSE, CAPILLARY
Glucose-Capillary: 139 mg/dL — ABNORMAL HIGH (ref 65–99)
Glucose-Capillary: 142 mg/dL — ABNORMAL HIGH (ref 65–99)
Glucose-Capillary: 153 mg/dL — ABNORMAL HIGH (ref 65–99)
Glucose-Capillary: 157 mg/dL — ABNORMAL HIGH (ref 65–99)
Glucose-Capillary: 161 mg/dL — ABNORMAL HIGH (ref 65–99)
Glucose-Capillary: 165 mg/dL — ABNORMAL HIGH (ref 65–99)

## 2018-01-21 LAB — CBC
HCT: 31.4 % — ABNORMAL LOW (ref 39.0–52.0)
HEMOGLOBIN: 9.8 g/dL — AB (ref 13.0–17.0)
MCH: 26.9 pg (ref 26.0–34.0)
MCHC: 31.2 g/dL (ref 30.0–36.0)
MCV: 86.3 fL (ref 78.0–100.0)
Platelets: 93 10*3/uL — ABNORMAL LOW (ref 150–400)
RBC: 3.64 MIL/uL — AB (ref 4.22–5.81)
RDW: 19.6 % — ABNORMAL HIGH (ref 11.5–15.5)
WBC: 14.2 10*3/uL — ABNORMAL HIGH (ref 4.0–10.5)

## 2018-01-21 LAB — T4, FREE: Free T4: 0.85 ng/dL (ref 0.61–1.12)

## 2018-01-21 LAB — POCT I-STAT 3, ART BLOOD GAS (G3+)
Acid-Base Excess: 2 mmol/L (ref 0.0–2.0)
Acid-Base Excess: 3 mmol/L — ABNORMAL HIGH (ref 0.0–2.0)
Acid-Base Excess: 7 mmol/L — ABNORMAL HIGH (ref 0.0–2.0)
BICARBONATE: 25.9 mmol/L (ref 20.0–28.0)
BICARBONATE: 26 mmol/L (ref 20.0–28.0)
Bicarbonate: 28.3 mmol/L — ABNORMAL HIGH (ref 20.0–28.0)
O2 SAT: 100 %
O2 Saturation: 81 %
O2 Saturation: 92 %
PCO2 ART: 31.6 mmHg — AB (ref 32.0–48.0)
PCO2 ART: 37 mmHg (ref 32.0–48.0)
Patient temperature: 98.9
Patient temperature: 98.9
TCO2: 27 mmol/L (ref 22–32)
TCO2: 27 mmol/L (ref 22–32)
TCO2: 29 mmol/L (ref 22–32)
pCO2 arterial: 27.7 mmHg — ABNORMAL LOW (ref 32.0–48.0)
pH, Arterial: 7.454 — ABNORMAL HIGH (ref 7.350–7.450)
pH, Arterial: 7.523 — ABNORMAL HIGH (ref 7.350–7.450)
pH, Arterial: 7.617 (ref 7.350–7.450)
pO2, Arterial: 40 mmHg — CL (ref 83.0–108.0)
pO2, Arterial: 61 mmHg — ABNORMAL LOW (ref 83.0–108.0)
pO2, Arterial: 209 mmHg — ABNORMAL HIGH (ref 83.0–108.0)

## 2018-01-21 LAB — COMPREHENSIVE METABOLIC PANEL
ALBUMIN: 2 g/dL — AB (ref 3.5–5.0)
ALK PHOS: 78 U/L (ref 38–126)
ALT: 68 U/L — AB (ref 17–63)
AST: 13 U/L — ABNORMAL LOW (ref 15–41)
Anion gap: 8 (ref 5–15)
BUN: 22 mg/dL — ABNORMAL HIGH (ref 6–20)
CALCIUM: 9.5 mg/dL (ref 8.9–10.3)
CO2: 26 mmol/L (ref 22–32)
CREATININE: 0.92 mg/dL (ref 0.61–1.24)
Chloride: 108 mmol/L (ref 101–111)
GFR calc Af Amer: >60 mL/min (ref >60)
GFR calc non Af Amer: >60 mL/min (ref >60)
GLUCOSE: 151 mg/dL — AB (ref 65–99)
Potassium: 3.8 mmol/L (ref 3.5–5.1)
SODIUM: 142 mmol/L (ref 135–145)
Total Bilirubin: 1.2 mg/dL (ref 0.3–1.2)
Total Protein: 4.5 g/dL — ABNORMAL LOW (ref 6.5–8.1)

## 2018-01-21 LAB — TRIGLYCERIDES: Triglycerides: 123 mg/dL (ref <150)

## 2018-01-21 LAB — PROCALCITONIN: PROCALCITONIN: 0.13 ng/mL

## 2018-01-21 MED ORDER — ETOMIDATE 2 MG/ML IV SOLN
20.0000 mg | Freq: Once | INTRAVENOUS | Status: AC
Start: 2018-01-21 — End: 2018-01-21
  Administered 2018-01-21: 20 mg via INTRAVENOUS

## 2018-01-21 MED ORDER — METOPROLOL TARTRATE 25 MG PO TABS
25.0000 mg | ORAL_TABLET | Freq: Two times a day (BID) | ORAL | Status: DC
  Administered 2018-01-21 (×2): 25 mg via ORAL
  Filled 2018-01-21 (×2): qty 1

## 2018-01-21 MED ORDER — PROPOFOL 1000 MG/100ML IV EMUL
5.0000 ug/kg/min | INTRAVENOUS | Status: DC
  Administered 2018-01-21: 5 ug/kg/min via INTRAVENOUS
  Administered 2018-01-22 (×2): 20 ug/kg/min via INTRAVENOUS
  Filled 2018-01-21 (×3): qty 100

## 2018-01-21 MED ORDER — PIPERACILLIN-TAZOBACTAM 3.375 G IVPB
3.3750 g | Freq: Three times a day (TID) | INTRAVENOUS | Status: DC
Start: 2018-01-21 — End: 2018-01-24
  Administered 2018-01-21 – 2018-01-24 (×9): 3.375 g via INTRAVENOUS
  Filled 2018-01-21 (×10): qty 50

## 2018-01-21 MED ORDER — VANCOMYCIN HCL IN DEXTROSE 1-5 GM/200ML-% IV SOLN
1000.0000 mg | Freq: Two times a day (BID) | INTRAVENOUS | Status: DC
  Administered 2018-01-21 – 2018-01-24 (×6): 1000 mg via INTRAVENOUS
  Filled 2018-01-21 (×7): qty 200

## 2018-01-21 MED ORDER — LEVETIRACETAM 100 MG/ML PO SOLN
750.0000 mg | Freq: Two times a day (BID) | ORAL | Status: DC
  Administered 2018-01-21 – 2018-01-30 (×19): 750 mg
  Filled 2018-01-21 (×21): qty 10

## 2018-01-21 MED ORDER — FAMOTIDINE IN NACL 20-0.9 MG/50ML-% IV SOLN
20.0000 mg | Freq: Two times a day (BID) | INTRAVENOUS | Status: DC
  Administered 2018-01-22 – 2018-01-24 (×7): 20 mg via INTRAVENOUS
  Filled 2018-01-21 (×8): qty 50

## 2018-01-21 MED ORDER — ROCURONIUM BROMIDE 50 MG/5ML IV SOLN
50.0000 mg | Freq: Once | INTRAVENOUS | Status: AC
  Administered 2018-01-21: 50 mg via INTRAVENOUS

## 2018-01-21 NOTE — Progress Notes (Signed)
PT Cancellation Note  Patient Details Name: [name] MRN: [id number] DOB: Oct 21, 1945   Cancelled Treatment:    Reason Eval/Treat Not Completed: Patient not medically ready.  Spoke with RN Lovena Le), pt is lethargic, not opening eyes for them, and on Veni-mask; not appropriate for therapy today. 01/21/2018  Donnella Sham, [place] [phone]  (pager)     Tessie Fass Ollin Hochmuth 01/21/2018, 11:53 AM

## 2018-01-21 NOTE — Procedures (Signed)
Endotracheal intubation  Indication: The patient is unresponsive and desaturating.  The patient is therefore intubated emergently for airway protection.  There is no overt clinical seizure activity prior to this event nor was there any nausea vomiting or overt aspiration.  Potential difficulties with intubation include a beard and short thick neck however he has a class I airway.  She was easily bagged and preoxygenated with an Ambu bag.  He was then sedated with 20 mg of etomidate followed by 50 mg of rocuronium.  The cords were easily visualized with a 4 Miller blade and a 7.5 endotracheal tube passed to 22 cm at the lip.  There are a great deal of purulent secretions in the trachea.  CO2 was positive and symmetric air movement was present.  Saturation was 100% following intubation.  Chest x-ray for placement is pending

## 2018-01-21 NOTE — Progress Notes (Signed)
SLP Cancellation Note  Patient Details Name: [name] MRN: [id number] DOB: May 24, 1946   Cancelled treatment:       Reason Eval/Treat Not Completed: Medical issues which prohibited therapy   Vaughan Garfinkle, Katherene Ponto 01/21/2018, 10:06 AM

## 2018-01-21 NOTE — Progress Notes (Addendum)
PULMONARY / CRITICAL CARE MEDICINE   Name: NICHOLS CORTER MRN: [id number] DOB: 1946-05-26    ADMISSION DATE:  12/09/2017 CONSULTATION DATE:  12/30/17  REFERRING MD:  Dr. Karie Kirks   CHIEF COMPLAINT:  SOB  BRIEF SUMMARY:   72 year old man with diabetes, chronic atrial fibrillation, thyrotoxicosis.  He was admitted for dyspnea and pulmonary edema, course complicated by encephalopathy and bilatearl subdural hematoma.  He had a reassuring LP.  He required mechanical ventilation for airway protection.  He has had bouts of atrial fibrillation with RVR, hypernatremia.   SUBJECTIVE:  RN reports pt drowsy this am.  Reportedly wiggled toes to command. No sedating meds overnight.  Glucose pending.  Remains on 4L/30% VM.    VITAL SIGNS: BP 125/82   Pulse (!) 106   Temp 98.9 F (37.2 C) (Axillary)   Resp (!) 25   Ht 5\' 9"  (1.753 m)   Wt 247 lb 12.8 oz (112.4 kg)   SpO2 93%   BMI 36.59 kg/m   HEMODYNAMICS:    VENTILATOR SETTINGS: FiO2 (%):  [30 %-55 %] 30 %  INTAKE / OUTPUT: I/O last 3 completed shifts: In: 1242.1 [I.V.:632.1; NG/GT:210; IV HDQQIWLNL:892] Out: 2105 [Urine:2100; Stool:5]  PHYSICAL EXAMINATION: General: chronically ill appearing male lying in bed, eyes closed HEENT: MM pink/moist, drooling Neuro: drowsy, moans with sternal rub, localizes with right arm  CV: s1s2 rrr, no m/r/g PULM: even/non-labored, lungs bilaterally with upper airway referred noise / rhonchi  GI: soft, non-tender, bsx4 active, coretrak Extremities: warm/dry, trace generalized edema  Skin: no rashes or lesions  LABS:  BMET Recent Labs  Lab 01/19/18 0515 01/20/18 0549 01/21/18 0547  NA 137 141 142  K 3.7 3.8 3.8  CL 105 106 108  CO2 25 25 26   BUN 31* 22* 22*  CREATININE 0.86 0.81 0.92  GLUCOSE 263* 201* 151*    Electrolytes Recent Labs  Lab 01/16/18 0532 01/17/18 0845  01/18/18 0528 01/19/18 0515 01/20/18 0549 01/21/18 0547  CALCIUM 10.2 9.6   < > 9.4 10.0 9.9 9.5  MG 1.8 1.8   --  1.8 1.8  --   --   PHOS 3.7  --   --  2.9 2.7  --   --    < > = values in this interval not displayed.    CBC Recent Labs  Lab 01/19/18 0515 01/20/18 0549 01/21/18 0547  WBC 22.2* 22.7* 14.2*  HGB 10.5* 10.9* 9.8*  HCT 33.3* 34.8* 31.4*  PLT 174 146* 93*    Coag's Recent Labs  Lab 01/15/18 0439  INR 1.33    Sepsis Markers Recent Labs  Lab 01/20/18 0656  PROCALCITON <0.10    ABG Recent Labs  Lab 01/15/18 0405 01/16/18 0333 01/17/18 0435  PHART 7.366 7.374 7.412  PCO2ART 48.5* 46.1 43.4  PO2ART 116* 115* 150*    Liver Enzymes Recent Labs  Lab 01/16/18 0532 01/17/18 0845 01/21/18 0547  AST 47* 24 13*  ALT 187* 186* 68*  ALKPHOS 88 84 78  BILITOT 0.7 0.7 1.2  ALBUMIN 1.8* 1.9* 2.0*    Cardiac Enzymes No results for input(s): TROPONINI, PROBNP in the last 168 hours.  Glucose Recent Labs  Lab 01/20/18 0338 01/20/18 0811 01/20/18 1216 01/20/18 1547 01/20/18 1958 01/20/18 2332  GLUCAP 168* 203* 198* 161* 154* 162*    Imaging Ct Head Wo Contrast  Result Date: 01/20/2018 CLINICAL DATA:  History of bilateral subdural hematoma. Vomiting today. EXAM: CT HEAD WITHOUT CONTRAST TECHNIQUE: Contiguous axial images were obtained from  the base of the skull through the vertex without intravenous contrast. COMPARISON:  Brain MRI from 10 days ago.  Head CT from 13 days ago FINDINGS: Brain: Compared to prior a subdural hematoma on the right has decreased in thickness and density. The largest residual pocket is along the right frontal convexity and measures 13 mm in thickness. Collection along the right occipital convexity has become low-density and measures 5 mm in thickness compared to 6 mm previously. No residual subdural hemorrhage is seen along the left. No evidence of infarct, hydrocephalus, or mass. Vascular: Atherosclerotic plaque Skull: No acute finding Sinuses/Orbits: No acute finding IMPRESSION: 1. No acute finding. 2. Expected decrease in thickness and  density of the known right subdural hematoma. No significant mass effect. Electronically Signed   By: Monte Fantasia M.D.   On: 01/20/2018 12:56     STUDIES:  CT Head 3/31 >> bilateral SDH's, R>L, minimal mass effect without significant midline shift MRI Brain 4/1 >> stable SDH's, trace leftward midline shift US Thyroid 4/4 >> 1.5 cm R TR 4 nodule, 1.4 cm left inferior T3 nodule EEG 4/7 >> consistent with mild generalized non-specific cerebral dysfunction.  No seizure or seizure predisposition recorded on this study.  ECHO 4/7 >> limited study to evaluate RV, normal LV function, severe bi-atrial enlargement, unable to estimate pulmonary pressure EEG 4/11 >> sedated EEG is abnormal with moderate diffuse background slowing LP 4/15 >> clear fluid, glucose 117, protein 74, RBC 1 CT Head 4/10 >> right hemispheric SDH appears stable, no new areas of ICH, edema or mass effect  CT Head 4/23 >> no acute findings, expected decrease in known R SDH  CULTURES: BCx2 3/29 >> coag neg staph 1/2 bottles  BCx2 4/1 >> negative  Sputum 4/9 >> Klebsiella pneumoniae >> R-ampicillin, otherwise sensitive  BCx2 4/6 >> negative  BCx2 4/9 >> coag neg staph 2/2  CSF 4/15 >> negative  CSF Fungal 4/15 >>   ANTIBIOTICS: Zosyn 3/30>> 4/5, restart 4/6 Vanc 3/31 >> 4/3  SIGNIFICANT EVENTS: 3/29  Admit  4/02  Tx to ICU for resp fx, AMS, AFwRVR, intubated 4/05  Extubated, tx out of ICU 4/06  Returned to ICU, bipap 4/15  LP 4/21  Extubated 4/22  N/V, cortrak placed 4/23  Increased secretions > NTS.  N/V overnight. TF stopped 4/24  Drowsy.    LINES/TUBES: RUE PICC 3/30 >>  ETT 4/2 >> 4/5 ETT 4/9 >> 4/21  DISCUSSION: 72 year old with DM II, chronic atrial fibrillation (previously on anticoagulation), CKD III who was initially admitted with dyspnea on exertion.  He was treated for CHF and found to have thyrotoxicosis which was treated.  He subsequently has had intermittent seizures and depressed mental status.   Found to have bilateral R>L SDH.  MRI with concern for enhancement of the meninges > LP performed.  4/21 much more awake down sedation has been held, extubated.    ASSESSMENT / PLAN:  PULMONARY A: Acute Hypoxic Respiratory Failure - resolved At Risk Aspiration - prolonged intubation, critical illness  Klebsiella Pneumoniae Aspiration PNA  Former Smoker - quit ~ 2009, likely COPD P:   ABG now  Aspiration precautions Not a candidate for BiPAP due to AMS, Vomiting  O2 as needed for sats 88-95% Duoneb Q6 Follow intermittent CXR  At risk re-intubation > if reintubated, consider early trach   CARDIOVASCULAR A:  Atrial Fibrillation - not a candidate for anticoagulation due to SDH CHF - on admit Hx HTN P:  ICU monitoring  Continue cardizem  Esmolol gtt  Begin Lopressor PT with goal of weaning Esmolol off for HR<100 Stopped amio due to hypothyroidism    RENAL A:   CKD III  Hypernatremia - resolved Hx Renal Cell Carcinoma s/p Resection P:   Trend BMP / urinary output Replace electrolytes as indicated Avoid nephrotoxic agents, ensure adequate renal perfusion  GASTROINTESTINAL A:   Vomiting - early am 4/22, 4/23. KUB negative for ileus/SBO. ? gastroparesis Elevated LFT's - ? Methimazole, Amiodarone  Hx Adenocarcinoma s/p Resection with Colostomy - 2010 P:   NPO / Coretrak  SLP efforts as able  PRN zofran  Reglan 5mg  IV Q6x4 doses  Consider restart TF in am 4/25 if no further vomiting (none since 4/23 am) Follow LFT's intermittently   HEMATOLOGIC A:   Anemia  P:  Trend CBC  SCD's for DVT prophylaxis      INFECTIOUS A:   R/O Aspiration PNA - 4/22, 4/23 with n/v P:   Follow fever curve / WBC trend off abx  Monitor CXR for evolving aspiration  Trend PCT   ENDOCRINE A:   Thyrotoxicosis  DM with Hyperglycemia    P:   SSI  Continue Methimazole 20 mg PT Q6  NEUROLOGIC A:   Seizures  Bilateral SDH - R>L Acute Metabolic Encephalopathy  P:   RASS goal: 0   Avoid all sedating medications as able  PT efforts  Continue Keppra  PRN ativan for seizure   FAMILY  - Updates:  No family at bedside am 4/24.    - Global: ICU status for neuro/respiratory monitoring   Noe Gens, NP-C [facility] Pgr: [phone] or if no answer [phone] 01/21/2018, 8:14 AM

## 2018-01-21 NOTE — Progress Notes (Signed)
ANTIBIOTIC CONSULT NOTE - INITIAL  Pharmacy Consult for vancomycin and zosyn Indication:HCAP  No Known Allergies  Patient Measurements: Height: 5\' 9"  (175.3 cm) Weight: 247 lb 12.8 oz (112.4 kg) IBW/kg (Calculated) : 70.7   Vital Signs: Temp: 99.9 F (37.7 C) (04/24 1600) Temp Source: Axillary (04/24 1600) BP: 122/76 (04/24 1655) Pulse Rate: 117 (04/24 1655) Intake/Output from previous day: 04/23 0701 - 04/24 0700 In: 825 [I.V.:415; NG/GT:210; IV Piggyback:200] Out: 1180 [Urine:1175; Stool:5] Intake/Output from this shift: Total I/O In: 210.8 [I.V.:110.8; NG/GT:100] Out: 475 [Urine:475]  Labs: Recent Labs    01/19/18 0515 01/20/18 0549 01/21/18 0547  WBC 22.2* 22.7* 14.2*  HGB 10.5* 10.9* 9.8*  PLT 174 146* 93*  CREATININE 0.86 0.81 0.92   Estimated Creatinine Clearance: 93.7 mL/min (by C-G formula based on SCr of 0.92 mg/dL). No results for input(s): VANCOTROUGH, VANCOPEAK, VANCORANDOM, GENTTROUGH, GENTPEAK, GENTRANDOM, TOBRATROUGH, TOBRAPEAK, TOBRARND, AMIKACINPEAK, AMIKACINTROU, AMIKACIN in the last 72 hours.   Microbiology: Recent Results (from the past 720 hour(s))  Culture, blood (routine x 2)     Status: Abnormal   Collection Time: 12/12/2017  9:00 PM  Result Value Ref Range Status   Specimen Description   Final    LEFT ANTECUBITAL Performed at [facility], [address] [place], [place] [address]    Special Requests   Final    BOTTLES DRAWN AEROBIC ONLY Blood Culture adequate volume Performed at [facility], [address] [place], [place] [address]    Culture  Setup Time   Final    GRAM POSITIVE COCCI Gram Stain Report Called to,Read Back By and Verified With: HYLTON @ 8295 ON [id number] BY HENDERSON L CRITICAL RESULT CALLED TO, READ BACK BY AND VERIFIED WITH: C HOWARD,RN AT 1931 12/27/17 BY L BENFIELD    Culture (A)  Final    STAPHYLOCOCCUS SPECIES (COAGULASE NEGATIVE) THE SIGNIFICANCE OF ISOLATING THIS ORGANISM FROM A SINGLE SET OF BLOOD CULTURES  WHEN MULTIPLE SETS ARE DRAWN IS UNCERTAIN. PLEASE NOTIFY THE MICROBIOLOGY DEPARTMENT WITHIN ONE WEEK IF SPECIATION AND SENSITIVITIES ARE REQUIRED. Performed at [facility], [place] [address] [place], [place] [address]    Report Status 12/30/2017 FINAL  Final  Blood Culture ID Panel (Reflexed)     Status: Abnormal   Collection Time: 12/15/2017  9:00 PM  Result Value Ref Range Status   Enterococcus species NOT DETECTED NOT DETECTED Final   Listeria monocytogenes NOT DETECTED NOT DETECTED Final   Staphylococcus species DETECTED (A) NOT DETECTED Final    Comment: Methicillin (oxacillin) susceptible coagulase negative staphylococcus. Possible blood culture contaminant (unless isolated from more than one blood culture draw or clinical case suggests pathogenicity). No antibiotic treatment is indicated for blood  culture contaminants. CRITICAL RESULT CALLED TO, READ BACK BY AND VERIFIED WITH: C HOWARD,RN AT 1931 12/27/17 BY L BENFIELD    Staphylococcus aureus NOT DETECTED NOT DETECTED Final   Methicillin resistance NOT DETECTED NOT DETECTED Final   Streptococcus species NOT DETECTED NOT DETECTED Final   Streptococcus agalactiae NOT DETECTED NOT DETECTED Final   Streptococcus pneumoniae NOT DETECTED NOT DETECTED Final   Streptococcus pyogenes NOT DETECTED NOT DETECTED Final   Acinetobacter baumannii NOT DETECTED NOT DETECTED Final   Enterobacteriaceae species NOT DETECTED NOT DETECTED Final   Enterobacter cloacae complex NOT DETECTED NOT DETECTED Final   Escherichia coli NOT DETECTED NOT DETECTED Final   Klebsiella oxytoca NOT DETECTED NOT DETECTED Final   Klebsiella pneumoniae NOT DETECTED NOT DETECTED Final   Proteus species NOT DETECTED  NOT DETECTED Final   Serratia marcescens NOT DETECTED NOT DETECTED Final   Haemophilus influenzae NOT DETECTED NOT DETECTED Final   Neisseria meningitidis NOT DETECTED NOT DETECTED Final   Pseudomonas aeruginosa NOT DETECTED NOT DETECTED Final   Candida  albicans NOT DETECTED NOT DETECTED Final   Candida glabrata NOT DETECTED NOT DETECTED Final   Candida krusei NOT DETECTED NOT DETECTED Final   Candida parapsilosis NOT DETECTED NOT DETECTED Final   Candida tropicalis NOT DETECTED NOT DETECTED Final    Comment: Performed at [facility], [place] [address] [place], [place] [address]  Culture, blood (routine x 2)     Status: None   Collection Time: 12/24/2017  9:10 PM  Result Value Ref Range Status   Specimen Description BLOOD LEFT HAND  Final   Special Requests   Final    BOTTLES DRAWN AEROBIC ONLY Blood Culture adequate volume   Culture   Final    NO GROWTH 5 DAYS Performed at [facility], [address] [place], [place] [address]    Report Status 12/31/2017 FINAL  Final  MRSA PCR Screening     Status: None   Collection Time: 12/27/17  1:46 AM  Result Value Ref Range Status   MRSA by PCR NEGATIVE NEGATIVE Final    Comment:        The GeneXpert MRSA Assay (FDA approved for NASAL specimens only), is one component of a comprehensive MRSA colonization surveillance program. It is not intended to diagnose MRSA infection nor to guide or monitor treatment for MRSA infections. Performed at [facility], [address], [place], [place] [address]   Culture, blood (Routine X 2) w Reflex to ID Panel     Status: None   Collection Time: 12/29/17 11:54 AM  Result Value Ref Range Status   Specimen Description BLOOD BLOOD LEFT HAND  Final   Special Requests   Final    BOTTLES DRAWN AEROBIC ONLY Blood Culture adequate volume   Culture   Final    NO GROWTH 5 DAYS Performed at [facility], [place] [address], [place], [place] [address]    Report Status 01/03/2018 FINAL  Final  Culture, blood (Routine X 2) w Reflex to ID Panel     Status: None   Collection Time: 12/29/17 11:57 AM  Result Value Ref Range Status   Specimen Description BLOOD BLOOD LEFT HAND  Final   Special Requests   Final    BOTTLES DRAWN AEROBIC ONLY Blood Culture  adequate volume   Culture   Final    NO GROWTH 5 DAYS Performed at [facility], [place] [address] [place], [place] [address]    Report Status 01/03/2018 FINAL  Final  Culture, blood (Routine X 2) w Reflex to ID Panel     Status: None   Collection Time: 01/03/18  1:17 PM  Result Value Ref Range Status   Specimen Description BLOOD RIGHT ANTECUBITAL  Final   Special Requests   Final    BOTTLES DRAWN AEROBIC ONLY Blood Culture adequate volume   Culture   Final    NO GROWTH 5 DAYS Performed at [facility], [place] [address], [place],  [address]    Report Status 01/08/2018 FINAL  Final  Culture, blood (Routine X 2) w Reflex to ID Panel     Status: None   Collection Time: 01/03/18  1:20 PM  Result Value Ref Range Status   Specimen Description BLOOD RIGHT ANTECUBITAL  Final  Special Requests   Final    BOTTLES DRAWN AEROBIC ONLY Blood Culture adequate volume   Culture   Final    NO GROWTH 5 DAYS Performed at [facility], [place] [address], [place], [place] [address]    Report Status 01/08/2018 FINAL  Final  Culture, blood (routine x 2)     Status: None   Collection Time: 01/06/18  9:32 AM  Result Value Ref Range Status   Specimen Description BLOOD LEFT ANTECUBITAL  Final   Special Requests   Final    BOTTLES DRAWN AEROBIC AND ANAEROBIC Blood Culture adequate volume   Culture   Final    NO GROWTH 5 DAYS Performed at [facility], [place] [address], [place], [place] [address]    Report Status 01/11/2018 FINAL  Final  Culture, blood (routine x 2)     Status: Abnormal   Collection Time: 01/06/18  9:43 AM  Result Value Ref Range Status   Specimen Description BLOOD HAND LEFT  Final   Special Requests   Final    BOTTLES DRAWN AEROBIC AND ANAEROBIC Blood Culture adequate volume   Culture  Setup Time   Final    GRAM POSITIVE COCCI IN BOTH AEROBIC AND ANAEROBIC BOTTLES Organism ID to follow CRITICAL RESULT CALLED TO, READ BACK BY AND VERIFIED WITH: Francene Boyers  PharmD 10:45 01/07/18 ([email or web address])    Culture (A)  Final    STAPHYLOCOCCUS SPECIES (COAGULASE NEGATIVE) THE SIGNIFICANCE OF ISOLATING THIS ORGANISM FROM A SINGLE SET OF BLOOD CULTURES WHEN MULTIPLE SETS ARE DRAWN IS UNCERTAIN. PLEASE NOTIFY THE MICROBIOLOGY DEPARTMENT WITHIN ONE WEEK IF SPECIATION AND SENSITIVITIES ARE REQUIRED. Performed at [facility], [place] [address] [place],  [address]    Report Status 01/09/2018 FINAL  Final  Blood Culture ID Panel (Reflexed)     Status: Abnormal   Collection Time: 01/06/18  9:43 AM  Result Value Ref Range Status   Enterococcus species NOT DETECTED NOT DETECTED Final   Listeria monocytogenes NOT DETECTED NOT DETECTED Final   Staphylococcus species DETECTED (A) NOT DETECTED Final    Comment: Methicillin (oxacillin) susceptible coagulase negative staphylococcus. Possible blood culture contaminant (unless isolated from more than one blood culture draw or clinical case suggests pathogenicity). No antibiotic treatment is indicated for blood  culture contaminants. CRITICAL RESULT CALLED TO, READ BACK BY AND VERIFIED WITH: Francene Boyers PharmD 10:45 01/07/18 ([email or web address])    Staphylococcus aureus NOT DETECTED NOT DETECTED Final   Methicillin resistance NOT DETECTED NOT DETECTED Final   Streptococcus species NOT DETECTED NOT DETECTED Final   Streptococcus agalactiae NOT DETECTED NOT DETECTED Final   Streptococcus pneumoniae NOT DETECTED NOT DETECTED Final   Streptococcus pyogenes NOT DETECTED NOT DETECTED Final   Acinetobacter baumannii NOT DETECTED NOT DETECTED Final   Enterobacteriaceae species NOT DETECTED NOT DETECTED Final   Enterobacter cloacae complex NOT DETECTED NOT DETECTED Final   Escherichia coli NOT DETECTED NOT DETECTED Final   Klebsiella oxytoca NOT DETECTED NOT DETECTED Final   Klebsiella pneumoniae NOT DETECTED NOT DETECTED Final   Proteus species NOT DETECTED NOT DETECTED Final   Serratia marcescens NOT DETECTED NOT DETECTED Final    Haemophilus influenzae NOT DETECTED NOT DETECTED Final   Neisseria meningitidis NOT DETECTED NOT DETECTED Final   Pseudomonas aeruginosa NOT DETECTED NOT DETECTED Final   Candida albicans NOT DETECTED NOT DETECTED Final   Candida glabrata NOT DETECTED NOT DETECTED Final   Candida krusei NOT DETECTED NOT DETECTED Final   Candida parapsilosis  NOT DETECTED NOT DETECTED Final   Candida tropicalis NOT DETECTED NOT DETECTED Final    Comment: Performed at [facility], [place] [address], [place], [place] [address]  Culture, respiratory (NON-Expectorated)     Status: None   Collection Time: 01/06/18  3:10 PM  Result Value Ref Range Status   Specimen Description TRACHEAL ASPIRATE  Final   Special Requests NONE  Final   Gram Stain   Final    ABUNDANT WBC PRESENT,BOTH PMN AND MONONUCLEAR MODERATE GRAM NEGATIVE RODS FEW GRAM POSITIVE COCCI Performed at [facility], [place] [address], [place], [place] [address]    Culture MODERATE KLEBSIELLA PNEUMONIAE  Final   Report Status 01/08/2018 FINAL  Final   Organism ID, Bacteria KLEBSIELLA PNEUMONIAE  Final      Susceptibility   Klebsiella pneumoniae - MIC*    AMPICILLIN RESISTANT Resistant     CEFAZOLIN <=4 SENSITIVE Sensitive     CEFEPIME <=1 SENSITIVE Sensitive     CEFTAZIDIME <=1 SENSITIVE Sensitive     CEFTRIAXONE <=1 SENSITIVE Sensitive     CIPROFLOXACIN <=0.25 SENSITIVE Sensitive     GENTAMICIN <=1 SENSITIVE Sensitive     IMIPENEM <=0.25 SENSITIVE Sensitive     TRIMETH/SULFA <=20 SENSITIVE Sensitive     AMPICILLIN/SULBACTAM 4 SENSITIVE Sensitive     PIP/TAZO <=4 SENSITIVE Sensitive     Extended ESBL NEGATIVE Sensitive     * MODERATE KLEBSIELLA PNEUMONIAE  CSF culture     Status: None   Collection Time: 01/12/18  9:38 AM  Result Value Ref Range Status   Specimen Description CSF  Final   Special Requests NONE  Final   Gram Stain   Final    CYTOSPIN SMEAR WBC PRESENT,BOTH PMN AND MONONUCLEAR NO ORGANISMS SEEN    Culture   Final     NO GROWTH 3 DAYS Performed at [facility], [place] [address] [place], [place] [address]    Report Status 01/15/2018 FINAL  Final  Fungus Culture With Stain     Status: None (Preliminary result)   Collection Time: 01/12/18  9:38 AM  Result Value Ref Range Status   Fungus Stain Final report  Final    Comment: (NOTE) Performed At: [facility] [place], [place] [id number] Rush Farmer MD PJ:[id number]    Fungus (Mycology) Culture PENDING  Incomplete   Fungal Source CSF  Final    Comment: Performed at [facility], [place] [address] [place], [place] [address]  Fungus Culture Result     Status: None   Collection Time: 01/12/18  9:38 AM  Result Value Ref Range Status   Result 1 Comment  Final    Comment: (NOTE) KOH/Calcofluor preparation:  no fungus observed. Performed At: [facility] [place], [place] [id number] Rush Farmer MD JQ:[id number] Performed at [facility], [place] [address] [place], [place] [address]     Medical History: Past Medical History:  Diagnosis Date  . Atrial fibrillation ([place])   . Cancer ([place])    colon 2010  . Chronic anticoagulation   . Diabetes mellitus without complication ([place])   . Hypertension      Assessment: 72 yo male who is unresponsive and desaturating and subsequently intubated. MD wishes to treat for HCAP. Pharmacy asked to dose Vancomycin and zosyn for patient. CrCl ~90.   Goal of Therapy:  Vanc troughs 15-20 mcg/ml  Plan:  Vancomycin 1000mg  IV q12h Zosyn 3.375gm IV q8h (EI) Monitor vancomycin  troughs as needed Monitor fever curve, CBC, LOT and de-escalation plan  Beya Tipps A. Levada Dy, PharmD, [place] Pager: [phone]  01/21/2018,5:14 PM

## 2018-01-21 NOTE — Progress Notes (Signed)
OT Cancellation Note  Patient Details Name: [name] MRN: [id number] DOB: [birth year]   Cancelled Treatment:    Reason Eval/Treat Not Completed: Medical issues which prohibited therapy. Spoke with RN Lovena Le), pt is lethargic, not opening eyes for them, and on Veni-mask; not appropriate for therapy today.   Almon Register [phone] 01/21/2018, 11:13 AM

## 2018-01-21 NOTE — Progress Notes (Signed)
Patients wife Remo Lipps was updated of patient being re-intubated. And updated on the patients condition at this time.

## 2018-01-21 NOTE — Progress Notes (Signed)
Upon assessment patient's SP02 was noted to be in the 80's. Patient's breathing was tachypneic and patient was placed on a non-re breather. MD was notified. MD came to the bedside to assess patient and patient was emergently intubated. Patient tolerated intubation well. Will continue to monitor patient.

## 2018-01-21 NOTE — Progress Notes (Signed)
ABG results called to Dr.Gray CCM, no new orders at this time

## 2018-01-22 ENCOUNTER — Inpatient Hospital Stay (HOSPITAL_COMMUNITY): Payer: Medicare Other

## 2018-01-22 DIAGNOSIS — I6340 Cerebral infarction due to embolism of unspecified cerebral artery: Secondary | ICD-10-CM

## 2018-01-22 LAB — BASIC METABOLIC PANEL
Anion gap: 8 (ref 5–15)
BUN: 22 mg/dL — AB (ref 6–20)
CALCIUM: 9 mg/dL (ref 8.9–10.3)
CO2: 24 mmol/L (ref 22–32)
CREATININE: 1.08 mg/dL (ref 0.61–1.24)
Chloride: 107 mmol/L (ref 101–111)
GFR calc Af Amer: >60 mL/min (ref >60)
GLUCOSE: 183 mg/dL — AB (ref 65–99)
POTASSIUM: 4 mmol/L (ref 3.5–5.1)
Sodium: 139 mmol/L (ref 135–145)

## 2018-01-22 LAB — T3, FREE: T3 FREE: 1 pg/mL — AB (ref 2.0–4.4)

## 2018-01-22 LAB — GLUCOSE, CAPILLARY
GLUCOSE-CAPILLARY: 160 mg/dL — AB (ref 65–99)
GLUCOSE-CAPILLARY: 169 mg/dL — AB (ref 65–99)
GLUCOSE-CAPILLARY: 184 mg/dL — AB (ref 65–99)
Glucose-Capillary: 158 mg/dL — ABNORMAL HIGH (ref 65–99)
Glucose-Capillary: 170 mg/dL — ABNORMAL HIGH (ref 65–99)
Glucose-Capillary: 188 mg/dL — ABNORMAL HIGH (ref 65–99)

## 2018-01-22 LAB — TROPONIN I
TROPONIN I: 0.19 ng/mL — AB (ref <0.03)
Troponin I: 0.19 ng/mL (ref <0.03)
Troponin I: 0.17 ng/mL (ref <0.03)

## 2018-01-22 LAB — CBC
HCT: 32.4 % — ABNORMAL LOW (ref 39.0–52.0)
HEMOGLOBIN: 10.2 g/dL — AB (ref 13.0–17.0)
MCH: 27.2 pg (ref 26.0–34.0)
MCHC: 31.5 g/dL (ref 30.0–36.0)
MCV: 86.4 fL (ref 78.0–100.0)
Platelets: 92 10*3/uL — ABNORMAL LOW (ref 150–400)
RBC: 3.75 MIL/uL — ABNORMAL LOW (ref 4.22–5.81)
RDW: 19.9 % — ABNORMAL HIGH (ref 11.5–15.5)
WBC: 16.8 10*3/uL — ABNORMAL HIGH (ref 4.0–10.5)

## 2018-01-22 LAB — CALCIUM, IONIZED: Calcium, Ionized, Serum: 5.6 mg/dL (ref 4.5–5.6)

## 2018-01-22 LAB — PROCALCITONIN: Procalcitonin: 0.29 ng/mL

## 2018-01-22 MED ORDER — METOPROLOL TARTRATE 25 MG PO TABS
25.0000 mg | ORAL_TABLET | Freq: Three times a day (TID) | ORAL | Status: DC
  Administered 2018-01-22 – 2018-01-24 (×6): 25 mg via ORAL
  Filled 2018-01-22 (×6): qty 1

## 2018-01-22 MED ORDER — METHIMAZOLE 10 MG PO TABS
20.0000 mg | ORAL_TABLET | Freq: Two times a day (BID) | ORAL | Status: DC
  Administered 2018-01-22 – 2018-01-24 (×6): 20 mg
  Filled 2018-01-22 (×7): qty 2

## 2018-01-22 MED ORDER — CHLORHEXIDINE GLUCONATE 0.12% ORAL RINSE (MEDLINE KIT)
15.0000 mL | Freq: Two times a day (BID) | OROMUCOSAL | Status: DC
Start: 2018-01-22 — End: 2018-01-27
  Administered 2018-01-22 – 2018-01-26 (×10): 15 mL via OROMUCOSAL

## 2018-01-22 MED ORDER — NITROGLYCERIN 2 % TD OINT
0.5000 [in_us] | TOPICAL_OINTMENT | Freq: Four times a day (QID) | TRANSDERMAL | Status: DC
  Administered 2018-01-22 – 2018-01-25 (×11): 0.5 [in_us] via TOPICAL
  Filled 2018-01-22: qty 30

## 2018-01-22 MED ORDER — ORAL CARE MOUTH RINSE
15.0000 mL | OROMUCOSAL | Status: DC
  Administered 2018-01-22 – 2018-01-30 (×80): 15 mL via OROMUCOSAL

## 2018-01-22 MED ORDER — ASPIRIN 81 MG PO CHEW
81.0000 mg | CHEWABLE_TABLET | Freq: Every day | ORAL | Status: DC
  Administered 2018-01-22 – 2018-01-30 (×9): 81 mg via ORAL
  Filled 2018-01-22 (×9): qty 1

## 2018-01-22 NOTE — Progress Notes (Signed)
SLP Cancellation Note  Patient Details Name: [name] MRN: [id number] DOB: July 30, 1946   Cancelled treatment:       Reason Eval/Treat Not Completed: Patient not medically ready. Will sign off again. Please reorder when appropriate.    Leen Tworek, Katherene Ponto 01/22/2018, 9:18 AM

## 2018-01-22 NOTE — Progress Notes (Signed)
R arm DL PICC out from original location approximately 6cm. RN aware. Recommend PICC exchange to place tip in the lower 1/3 of the SVC.

## 2018-01-22 NOTE — Progress Notes (Addendum)
PT Cancellation Note  Patient Details Name: MORRILL BOMKAMP MRN: [id number] DOB: 06-11-1946   Cancelled Treatment:    Reason Eval/Treat Not Completed: Medical issues which prohibited therapy(medical decline now intubated, await medical clearance for therapy) Spoke to RN and will plan to check back 4/29   Davin Archuletta B Dalesha Stanback 01/22/2018, 7:20 AM  Elwyn Reach, [place]

## 2018-01-22 NOTE — Consult Note (Signed)
Stroke neurology consult note  Referring Physician: Dr Dominica Severin    Chief Complaint: new right MCA infarct, previous subdural hematoma  HPI: [name] is a 72 y.o. male with a history of hypertension, EtOH history, COPD, diabetes mellitus, atrial fibrillation previously on Coumadin, thyrotoxicosis, pulmonary edema, a history of colon cancer with current colostomy, stage III chronic kidney disease, and a history of renal cell carcinoma s/p resection who was admitted to [facility] on 12/07/2017 with shortness of breath, low INR, afib RVR and pulmonary edema. He was also found to have bilateral subdural hematomas, R>L shortly after admission. He has had multiple consults including cardiology, neurology, urology, critical care, and palliative care. Had multiple intubation and extubations as his respiratory status wax and wane. LP not consistent with infection, no tumor cells. Thyrotoxicosis treated with methimazole and now FT4 normal but FT3 still low. Neurology had been involved in his care from 12/28/17 to 01/14/18 for possible seizure activity on presentation with SDH. However, multiple EEGs showed diffuse slow and no seizure. He is on keppra 776m bid since.   According to the respiratory therapist earlier this week when the patient was extubated on 01/14/18 he had been able to converse. However, mental status declined for the last a couple of days and concerning for aspiration, put on abx. He was finally extubated again yesterday afternoon. An MRI was performed today secondary to a decline in his mental status and it showed a large territory acute infarct in the right posterior division of the MCA with a clot noted in the right M2 segment compatible with emboli. The patient's right sided subacute subdural hematoma was noted to be unchanged, left side small SDH resolved. A stroke team consult was requested. EEG repeat also ordered.  Date last known well: Unable to determine Time last known well:  Unable to determine  tPA Given: No - unknown time of onset and known subdural hematomas  Past Medical History Past Medical History:  Diagnosis Date  . Atrial fibrillation ([place]   . Cancer ([place]    colon 2010  . Chronic anticoagulation   . Diabetes mellitus without complication ([place]   . Hypertension     Surgical History Past Surgical History:  Procedure Laterality Date  . COLOSTOMY      Family History  Family History  Problem Relation Age of Onset  . Hypertension Mother   . Hypertension Father     Social History:   reports that he has quit smoking. He has never used smokeless tobacco. He reports that he drinks alcohol. He reports that he does not use drugs.  Allergies:  No Known Allergies  Home Medications:  Medications Prior to Admission  Medication Sig Dispense Refill  . ANORO ELLIPTA 62.5-25 MCG/INH AEPB TAKE 1 PUFF BY MOUTH EVERY DAY  3  . CALCIUM PO Take 200 mg by mouth daily.     . Cholecalciferol 1000 units tablet Take 1,000 Units by mouth daily.    .Marland Kitchendicyclomine (BENTYL) 20 MG tablet Take 20 mg by mouth 3 (three) times daily as needed for spasms.     .Marland Kitchendiltiazem (CARDIZEM) 60 MG tablet Take 60 mg by mouth 4 (four) times daily.     . DULoxetine (CYMBALTA) 20 MG capsule Take 20 mg by mouth daily.    .Marland Kitchengabapentin (NEURONTIN) 300 MG capsule Take 300 mg by mouth daily.     .Marland KitchenHYDROcodone-acetaminophen (NORCO) 10-325 MG tablet Take 1 tablet by mouth every 6 (six) hours as needed for  moderate pain.     . IRON PO Take 55 mg by mouth daily.     Marland Kitchen LYRICA 100 MG capsule Take 100 mg by mouth 3 (three) times daily.     . metFORMIN (GLUCOPHAGE) 1000 MG tablet Take 1,000 mg by mouth 2 (two) times daily.     . Omega-3 Fatty Acids (FISH OIL PO) Take 1,000 mg by mouth daily.     . pioglitazone (ACTOS) 45 MG tablet Take 45 mg by mouth daily.     . Probiotic Product (PROBIOTIC PO) Take 1 tablet by mouth daily.     . simvastatin (ZOCOR) 80 MG tablet Take 40 mg by mouth daily.      Marland Kitchen warfarin (COUMADIN) 5 MG tablet Take 5-10 tablets by mouth as directed. 10 mg on [facility] FRI 5 mg TUES SAT      Hospital Medications . chlorhexidine  15 mL Mouth Rinse BID  . chlorhexidine gluconate (MEDLINE KIT)  15 mL Mouth Rinse BID  . Chlorhexidine Gluconate Cloth  6 each Topical Daily  . diltiazem  60 mg Oral Q8H  . insulin aspart  0-15 Units Subcutaneous Q4H  . insulin aspart  3 Units Subcutaneous Q4H  . ipratropium-albuterol  3 mL Nebulization TID  . levETIRAcetam  750 mg Per Tube BID  . mouth rinse  15 mL Mouth Rinse 10 times per day  . methimazole  20 mg Per Tube BID  . metoprolol tartrate  25 mg Oral Q8H  . multivitamin  15 mL Per Tube Daily  . nitroGLYCERIN  0.5 inch Topical Q6H  . sodium chloride flush  10-40 mL Intracatheter Q12H    ROS: not obtainable at this time   Physical Examination:  Vitals:   01/22/18 1000 01/22/18 1100 01/22/18 1200 01/22/18 1341  BP: 127/71 122/78 101/63 132/82  Pulse: (!) 116 (!) 112 (!) 107 (!) 109  Resp: (!) 22 (!) 24 18 (!) 21  Temp:   99.3 F (37.4 C)   TempSrc:   Oral   SpO2: 99% 100% 100% 100%  Weight:      Height:        General - large 72 year old male - in bed - intubated not on sedation - not responsive Heart - irregularly irregular rhythm - no murmer appreciated Lungs - Clear to auscultation anteriorly Abdomen - Soft - non tender Extremities - Distal weak to absent - SCDs Skin - Warm and dry  Neurologic Examination:  Mental Status: The patient has his eyes closed. He does not respond to voice or painful stimuli. He follows no commands. Intubated on vent without sedation. Cranial Nerves: II:  pupils equal, round, large (5-32m) but reactive III,IV, VI: eye movements wandering back and forth in both sides, with right sided movement he had end gaze unsustained nystagmus. V,VII: unable to evaluate, intubated with ET tube VIII: unable to evaluate IX,X: unable to evaluate XI: unable to evaluate XII: unable to  evaluate Motor: no movements on LUE, trace withdraw in RUE and BLEs with painful stimulation Sensory: There is trace withdrawal to painful stimuli at RUE and BLEs Plantars: Right: upgoing   Left: downgoing Cerebellar: unable to evaluate Gait: deferred at this time.    LABORATORY STUDIES:  Basic Metabolic Panel: Recent Labs  Lab 01/16/18 0532 01/17/18 0845  01/18/18 0426804/22/19 0515 01/20/18 0549 01/21/18 0547 01/22/18 0524  NA 149* 140   < > 138 137 141 142 139  K 4.1 4.5   < > 4.1 3.7 3.8  3.8 4.0  CL 118* 110   < > 107 105 106 108 107  CO2 25 25   < > _0 GLUCOSE 164* 207*   < > 251* 263* 201* 151* 183*  BUN 54* 56*   < > 57* 31* 22* 22* 22*  CREATININE 0.82 0.87   < > 0.88 0.86 0.81 0.92 1.08  CALCIUM 10.2 9.6   < > 9.4 10.0 9.9 9.5 9.0  MG 1.8 1.8  --  1.8 1.8  --   --   --   PHOS 3.7  --   --  2.9 2.7  --   --   --    < > = values in this interval not displayed.    Liver Function Tests: Recent Labs  Lab 01/16/18 0532 01/17/18 0845 01/21/18 0547  AST 47* 24 13*  ALT 187* 186* 68*  ALKPHOS 88 84 78  BILITOT 0.7 0.7 1.2  PROT 4.9* 4.8* 4.5*  ALBUMIN 1.8* 1.9* 2.0*   Recent Labs  Lab 01/17/18 0845 01/20/18 0656  LIPASE 21  --   AMYLASE  --  66   Recent Labs  Lab 01/16/18 0532 01/17/18 0500  AMMONIA 12 23    CBC: Recent Labs  Lab 01/17/18 0500 01/18/18 0528 01/19/18 0515 01/20/18 0549 01/21/18 0547 01/22/18 0524  WBC 10.8* 11.6* 22.2* 22.7* 14.2* 16.8*  NEUTROABS 10.3* 11.1* 22.0*  --   --   --   HGB 9.1* 8.8* 10.5* 10.9* 9.8* 10.2*  HCT 30.0* 28.4* 33.3* 34.8* 31.4* 32.4*  MCV 86.5 85.3 84.5 86.4 86.3 86.4  PLT 133* 114* 174 146* 93* 92*    Cardiac Enzymes: Recent Labs  Lab 01/22/18 0845  TROPONINI 0.19*    BNP: Invalid input(s): POCBNP  CBG: Recent Labs  Lab 01/21/18 2010 01/21/18 2350 01/22/18 0415 01/22/18 0814 01/22/18 1205  GLUCAP 161* 142* 160* 158* 170*    Microbiology:   Coagulation  Studies: No results for input(s): LABPROT, INR in the last 72 hours.  Urinalysis: No results for input(s): COLORURINE, LABSPEC, PHURINE, GLUCOSEU, HGBUR, BILIRUBINUR, KETONESUR, PROTEINUR, UROBILINOGEN, NITRITE, LEUKOCYTESUR in the last 168 hours.  Invalid input(s): APPERANCEUR  Lipid Panel:     Component Value Date/Time   TRIG 123 01/21/2018 1809    HgbA1C:  Lab Results  Component Value Date   HGBA1C 6.1 (H) 12/29/2017    Urine Drug Screen:      Component Value Date/Time   LABOPIA POSITIVE (A) 12/27/2017 2110   COCAINSCRNUR NONE DETECTED 12/25/2017 2110   LABBENZ NONE DETECTED 12/21/2017 2110   AMPHETMU NONE DETECTED 12/21/2017 2110   THCU NONE DETECTED 11/29/2017 2110   LABBARB NONE DETECTED 12/03/2017 2110     Alcohol Level:  No results for input(s): ETH in the last 168 hours.   IMAGING:  Mr Brain Wo Contrast 01/22/2018 IMPRESSION:  Large territory acute infarct right posterior division of MCA. There is clot in the right M2 segment compatible with emboli. The patient has history of atrial fibrillation. Small subacute right sided subdural hematoma unchanged. No midline shift.   EEG The EEG is abnormal findings are suggestive of moderate to severe generalized cerebral dysfunction.  Epileptiform features were not seen during this recording.  Dg Chest Port 1 View 01/22/2018 IMPRESSION:  1.  Lines and tubes in stable position.  2. Persistent cardiomegaly. Persistent right base infiltrate/edema. Progressive left base infiltrate/edema. Small left pleural effusion. A component of CHF may be present. Pneumonia cannot be excluded.  Assessment: 72 y.o. male with complicated and extensive medical conditions including HTN, EtOH abuse, COPD, DM, afib previously on Coumadin, thyrotoxicosis, CHF, colon cancer s/p colostomy, CKD III, and renal cell carcinoma s/p resection who was admitted on 12/08/2017 with SOB, low INR, afib RVR and pulmonary edema. He was found to have  bilateral subdural hematomas, R>L. Anticoagulation on hold. Possible seizure activity on keppra although EEG showed diffuse slowing. Recent mental decline, was reintubated and MRI showed large right MCA infarct, likely due to afib not on AC due to SDH. EEG no seizure but severe cerebral dysfunction.    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 SDH has been 3 weeks out and CT showed subacute now, will start ASA 28m.   Ongoing aggressive stroke risk factor management  Therapy recommendations:  pending  Disposition:  Pending  Had long discussion with daughter JCaryl Aspover the phone. She said pt had expressed to her a year ago that if pt becomes a "drooling idiot" in nursing home then he does not live like that. Daughter stated that pt not very compliant with medication at home and she was not surprised that someday pt would ends up with medically critical condition.   Will need to talk to pt wife regarding palliative care and comfort care given poor prognosis.  Persistent AFib with RVR  On esmolol drip  HR 100s  On ASA  Hold off AC due to large infarct and recent SDH  Thyrotoxicosis   On methimazole   Low TSH  FT4 normalizing   FT3 still low  SDH with seizure like activity  Initially CT right acute SDH, left chronic small SDH  Repeat CT now left chronic small SDH resolved and right SDH became subacute  EEG no seizure  Will start ASA 81 given right MCA infarct  On keppra - continue  Fever with pneumonia  Tmax 102.2  CXR bilateral infiltration  Intubated   On zosyn  Diabetes  HgbA1c 6.1 goal < 7.0  Controlled  CBG  monitoring  SSI  Hypertension Stable Permissive hypertension (OK if <180/105) for 24-48 hours post stroke and then gradually normalized within 5-7 days.  Other Stroke Risk Factors  Advanced age  ETOH abuse  Obesity, Body mass index is 37.11 kg/m.   CHF - cardiomegaly   Other Active Problems  Encephalopathy  Colon cancer s/p colostomy   Renal cell carcinoma s/p resection   Hospital day # 27  This patient is critically ill due to large stroke, afib RVR, pneumonia, SDH, CHF, fever, thyrotoxicosis, and at significant risk of neurological worsening, death form recurrent stroke, hemorrhagic conversion, heart failure, sepsis, septic shock. This patient's care requires constant monitoring of vital signs, hemodynamics, respiratory and cardiac monitoring, review of multiple databases, neurological assessment, discussion with family, other specialists and medical decision making of high complexity. I spent 45 minutes of neurocritical care time in the care of this patient. I had long discussion with daughter JCaryl Aspover the phone, updated pt current condition, treatment plan and poor prognosis. She expressed understanding and appreciation. She asked me to talk to her step mother too.    JRosalin Hawking MD  PhD Stroke Neurology 01/22/2018 6:27 PM    To contact Stroke Continuity provider, please refer to [email or web address] After hours, contact General Neurology

## 2018-01-22 NOTE — Progress Notes (Signed)
Bedside EEG completed, results pending. 

## 2018-01-22 NOTE — Procedures (Signed)
Date of recording 01/22/2018  Referring physician Dr. Pearline Cables  Reason for the study Unresponsiveness, stroke, intubated  Technical Digital EEG recording using 10-20 International electrode system  Description of the recording Amplitude EEG with generalized delta slowing. At times posterior rhythm is 5 Hz without any reactivity Sleep architecture was not seen. Epileptiform features were not seen during this recording  Impression The EEG is abnormal findings are suggestive of moderate to severe generalized cerebral dysfunction.  Epileptiform features were not seen during this recording.

## 2018-01-22 NOTE — Progress Notes (Signed)
PULMONARY / CRITICAL CARE MEDICINE   Name: [name] MRN: [id number] DOB: Sep 02, 1946    ADMISSION DATE:  12/07/2017 CONSULTATION DATE:  12/30/17  REFERRING MD:  Dr. Karie Kirks   CHIEF COMPLAINT:  SOB  BRIEF SUMMARY:   72 year old man with diabetes, chronic atrial fibrillation, thyrotoxicosis.  He was admitted for dyspnea and pulmonary edema, course complicated by encephalopathy and bilatearl subdural hematoma.  He had a reassuring LP.  He required mechanical ventilation for airway protection.  He has had bouts of atrial fibrillation with RVR, hypernatremia.  Yesterday evening he required reintubation for overt respiratory distress in the setting of decreased mental status.  There were purulent secretions in the trachea at the time of intubation.  He was placed on propofol at 20 mcg overnight as he was hyperventilating to a pH greater than 7.6.  He continues on a low dose of esmolol at 25 mcg.  He is not at all interactive during my visit this morning   SUBJECTIVE:  RN reports pt drowsy this am.  Reportedly wiggled toes to command. No sedating meds overnight.  Glucose pending.  Remains on 4L/30% VM.    VITAL SIGNS: BP (!) 97/56   Pulse 100   Temp 99.9 F (37.7 C) (Oral)   Resp 18   Ht 5\' 9"  (1.753 m)   Wt 251 lb 5.2 oz (114 kg)   SpO2 100%   BMI 37.11 kg/m   HEMODYNAMICS:    VENTILATOR SETTINGS: Vent Mode: PRVC FiO2 (%):  [30 %-100 %] 40 % Set Rate:  [14 bmp] 14 bmp Vt Set:  [570 mL] 570 mL PEEP:  [5 cmH20] 5 cmH20 Plateau Pressure:  [13 cmH20-16 cmH20] 13 cmH20  INTAKE / OUTPUT: I/O last 3 completed shifts: In: 5020.9 [I.V.:4320.9; NG/GT:100; IV Piggyback:600] Out: 8299 [Urine:1750; Stool:30]  PHYSICAL EXAMINATION: General: Obese and chronically ill-appearing, he is orally intubated and mechanically ventilated.  He is a not in any overt distress  Neuro: Exam is on 20 mcg of propofol.  There is no response to voice there is only partial eye opening to sternal rub and  no limb movement.  Pupils are equal and reactive.  EOMs are wandering.  DTRs are decreased CV: S1 and S2 are irregularly irregular.  They are somewhat distant.  There is no murmur rub or gallop.  The PMI is not remarkable.  There is ST segment depression on the telemetry monitor.   PULM: His breathing substantially above the set ventilator rate.  Respirations are unlabored, there is symmetric air movement, scattered rhonchi, no wheezes.    GI: The abdomen is obese and soft without any organomegaly masses tenderness guarding or rebound  Extremities: Trace anasarca   LABS:  BMET Recent Labs  Lab 01/20/18 0549 01/21/18 0547 01/22/18 0524  NA 141 142 139  K 3.8 3.8 4.0  CL 106 108 107  CO2 25 26 24   BUN 22* 22* 22*  CREATININE 0.81 0.92 1.08  GLUCOSE 201* 151* 183*    Electrolytes Recent Labs  Lab 01/16/18 0532 01/17/18 0845  01/18/18 0528 01/19/18 0515 01/20/18 0549 01/21/18 0547 01/22/18 0524  CALCIUM 10.2 9.6   < > 9.4 10.0 9.9 9.5 9.0  MG 1.8 1.8  --  1.8 1.8  --   --   --   PHOS 3.7  --   --  2.9 2.7  --   --   --    < > = values in this interval not displayed.    CBC Recent  Labs  Lab 01/20/18 0549 01/21/18 0547 01/22/18 0524  WBC 22.7* 14.2* 16.8*  HGB 10.9* 9.8* 10.2*  HCT 34.8* 31.4* 32.4*  PLT 146* 93* 92*    Coag's No results for input(s): APTT, INR in the last 168 hours.  Sepsis Markers Recent Labs  Lab 01/20/18 0656 01/21/18 0547 01/22/18 0524  PROCALCITON <0.10 0.13 0.29    ABG Recent Labs  Lab 01/21/18 0902 01/21/18 0913 01/21/18 1812  PHART 7.523* 7.454* 7.617*  PCO2ART 31.6* 37.0 27.7*  PO2ART 40.0* 61.0* 209.0*    Liver Enzymes Recent Labs  Lab 01/16/18 0532 01/17/18 0845 01/21/18 0547  AST 47* 24 13*  ALT 187* 186* 68*  ALKPHOS 88 84 78  BILITOT 0.7 0.7 1.2  ALBUMIN 1.8* 1.9* 2.0*    Cardiac Enzymes No results for input(s): TROPONINI, PROBNP in the last 168 hours.  Glucose Recent Labs  Lab 01/21/18 0838  01/21/18 1203 01/21/18 1556 01/21/18 2010 01/21/18 2350 01/22/18 0415  GLUCAP 153* 165* 157* 161* 142* 160*    Imaging Dg Chest Port 1 View  Result Date: 01/21/2018 CLINICAL DATA:  Status post intubation today. EXAM: PORTABLE CHEST 1 VIEW COMPARISON:  Single view of the chest earlier today FINDINGS: New endotracheal tube is in place with tip in good position just below the clavicular heads. Right PICC and feeding tube are again seen. Bibasilar airspace disease is unchanged. No pneumothorax. Cardiomegaly. IMPRESSION: ET tube projects in good position. Right PICC and feeding tube are unchanged. No change in bibasilar airspace disease. Electronically Signed   By: Inge Rise M.D.   On: 01/21/2018 17:40     STUDIES:  CT Head 3/31 >> bilateral SDH's, R>L, minimal mass effect without significant midline shift MRI Brain 4/1 >> stable SDH's, trace leftward midline shift US Thyroid 4/4 >> 1.5 cm R TR 4 nodule, 1.4 cm left inferior T3 nodule EEG 4/7 >> consistent with mild generalized non-specific cerebral dysfunction.  No seizure or seizure predisposition recorded on this study.  ECHO 4/7 >> limited study to evaluate RV, normal LV function, severe bi-atrial enlargement, unable to estimate pulmonary pressure EEG 4/11 >> sedated EEG is abnormal with moderate diffuse background slowing LP 4/15 >> clear fluid, glucose 117, protein 74, RBC 1 CT Head 4/10 >> right hemispheric SDH appears stable, no new areas of ICH, edema or mass effect  CT Head 4/23 >> no acute findings, expected decrease in known R SDH  CULTURES: BCx2 3/29 >> coag neg staph 1/2 bottles  BCx2 4/1 >> negative  Sputum 4/9 >> Klebsiella pneumoniae >> R-ampicillin, otherwise sensitive  BCx2 4/6 >> negative  BCx2 4/9 >> coag neg staph 2/2  CSF 4/15 >> negative  CSF Fungal 4/15 >>   ANTIBIOTICS: Zosyn 3/30>> 4/5, restart 4/6 Vanc 3/31 >> 4/3  SIGNIFICANT EVENTS: 3/29  Admit  4/02  Tx to ICU for resp fx, AMS, AFwRVR,  intubated 4/05  Extubated, tx out of ICU 4/06  Returned to ICU, bipap 4/15  LP 4/21  Extubated 4/22  N/V, cortrak placed 4/23  Increased secretions > NTS.  N/V overnight. TF stopped 4/24  Drowsy.   4/24  reintubated  LINES/TUBES: RUE PICC 3/30 >>  ETT 4/2 >> 4/5 ETT 4/9 >> 4/21  DISCUSSION: 72 year old with DM II, chronic atrial fibrillation (previously on anticoagulation), CKD III who was initially admitted with dyspnea on exertion.  He was treated for CHF and found to have thyrotoxicosis which was treated.  He subsequently has had intermittent seizures and depressed mental status.  Found to have bilateral R>L SDH.  MRI with concern for enhancement of the meninges > LP performed.  4/21 much more awake down sedation has been held, extubated.    ASSESSMENT / PLAN:  PULMONARY A: Acute Hypoxic Respiratory Failure - resolved At Risk Aspiration - prolonged intubation, critical illness  Klebsiella Pneumoniae Aspiration PNA  Former Smoker - quit ~ 2009, likely COPD P:   ABG now  Aspiration precautions Not a candidate for BiPAP due to AMS, Vomiting  O2 as needed for sats 88-95% Duoneb Q6 Follow intermittent CXR  At risk re-intubation > if reintubated, consider early trach   CARDIOVASCULAR A:  Atrial Fibrillation - not a candidate for anticoagulation due to SDH CHF - on admit Hx HTN P:  I am increasing his enteral dose of metoprolol for rate control and anticipate tapering off of esmolol entirely today.     RENAL A:   CKD III  Hypernatremia - resolved Hx Renal Cell Carcinoma s/p Resection P:   Trend BMP / urinary output Replace electrolytes as indicated Avoid nephrotoxic agents, ensure adequate renal perfusion  GASTROINTESTINAL A:   Vomiting - early am 4/22, 4/23. KUB negative for ileus/SBO. ? gastroparesis Elevated LFT's - ? Methimazole, Amiodarone  Hx Adenocarcinoma s/p Resection with Colostomy - 2010 P:   NPO / Coretrak  SLP efforts as able  PRN zofran  Reglan  5mg  IV Q6x4 doses  Consider restart TF in am 4/25 if no further vomiting (none since 4/23 am) Follow LFT's intermittently  I am concerned that both his calcium channel blocker and overcorrection of his hypothyroidism have contributed to ileus.  Both of these issues are being addressed.  HEMATOLOGIC A:   Anemia  P:  Trend CBC  SCD's for DVT prophylaxis      INFECTIOUS A:   R/O Aspiration PNA - 4/22, 4/23 with n/v P:   Comycin and Zosyn were initiated following intubation on 4/24, Gram stain is showing mixed flora and many white cells we do not yet have a culture report    ENDOCRINE A:   Thyrotoxicosis  DM with Hyperglycemia    P:   SSI  Continue Methimazole 20 mg PT Q6  NEUROLOGIC A:   Mental status has declined over the past several days and it appears that he is suffered from an episode of aspiration.  I have numerous concerns.  We may have over corrected his hyperthyroidism, his last free T3 was half-normal.  I have decreased his Tapazole and discontinued his iodine.  He also had a seizure earlier this admission and I have ordered a repeat EEG.  Finally he is chronically in atrial fibrillation and cannot be anticoagulated due to his subdural hematomas.  I have ordered an MRI to see if he is suffered from a small embolism accounting for his mental status decline.  Lars Masson, MD Critical Care Pgr: [phone] or if no answer [phone] 01/22/2018, 7:38 AM

## 2018-01-23 ENCOUNTER — Inpatient Hospital Stay (HOSPITAL_COMMUNITY): Payer: Medicare Other

## 2018-01-23 LAB — COMPREHENSIVE METABOLIC PANEL
ALK PHOS: 65 U/L (ref 38–126)
ALT: 43 U/L (ref 17–63)
ANION GAP: 12 (ref 5–15)
AST: 13 U/L — ABNORMAL LOW (ref 15–41)
Albumin: 1.8 g/dL — ABNORMAL LOW (ref 3.5–5.0)
BILIRUBIN TOTAL: 1.4 mg/dL — AB (ref 0.3–1.2)
BUN: 20 mg/dL (ref 6–20)
CALCIUM: 9 mg/dL (ref 8.9–10.3)
CO2: 25 mmol/L (ref 22–32)
CREATININE: 0.94 mg/dL (ref 0.61–1.24)
Chloride: 101 mmol/L (ref 101–111)
Glucose, Bld: 181 mg/dL — ABNORMAL HIGH (ref 65–99)
Potassium: 3.1 mmol/L — ABNORMAL LOW (ref 3.5–5.1)
SODIUM: 138 mmol/L (ref 135–145)
TOTAL PROTEIN: 4.5 g/dL — AB (ref 6.5–8.1)

## 2018-01-23 LAB — BLOOD GAS, ARTERIAL
Acid-Base Excess: 3.9 mmol/L — ABNORMAL HIGH (ref 0.0–2.0)
Bicarbonate: 27.3 mmol/L (ref 20.0–28.0)
DRAWN BY: 414221
FIO2: 40
O2 SAT: 99.4 %
PEEP: 5 cmH2O
PH ART: 7.479 — AB (ref 7.350–7.450)
Patient temperature: 98.9
RATE: 14 resp/min
VT: 570 mL
pCO2 arterial: 37.3 mmHg (ref 32.0–48.0)
pO2, Arterial: 139 mmHg — ABNORMAL HIGH (ref 83.0–108.0)

## 2018-01-23 LAB — GLUCOSE, CAPILLARY
Glucose-Capillary: 189 mg/dL — ABNORMAL HIGH (ref 65–99)
Glucose-Capillary: 167 mg/dL — ABNORMAL HIGH (ref 65–99)
Glucose-Capillary: 171 mg/dL — ABNORMAL HIGH (ref 65–99)
Glucose-Capillary: 198 mg/dL — ABNORMAL HIGH (ref 65–99)
Glucose-Capillary: 192 mg/dL — ABNORMAL HIGH (ref 65–99)

## 2018-01-23 LAB — CBC WITH DIFFERENTIAL/PLATELET
Basophils Absolute: 0 10*3/uL (ref 0.0–0.1)
Basophils Relative: 0 %
EOS ABS: 0.2 10*3/uL (ref 0.0–0.7)
Eosinophils Relative: 2 %
HEMATOCRIT: 29.1 % — AB (ref 39.0–52.0)
HEMOGLOBIN: 9.2 g/dL — AB (ref 13.0–17.0)
LYMPHS ABS: 0.5 10*3/uL — AB (ref 0.7–4.0)
LYMPHS PCT: 5 %
MCH: 26.9 pg (ref 26.0–34.0)
MCHC: 31.6 g/dL (ref 30.0–36.0)
MCV: 85.1 fL (ref 78.0–100.0)
MONOS PCT: 3 %
Monocytes Absolute: 0.3 10*3/uL (ref 0.1–1.0)
NEUTROS PCT: 90 %
Neutro Abs: 8.7 10*3/uL — ABNORMAL HIGH (ref 1.7–7.7)
Platelets: 72 10*3/uL — ABNORMAL LOW (ref 150–400)
RBC: 3.42 MIL/uL — AB (ref 4.22–5.81)
RDW: 19.5 % — ABNORMAL HIGH (ref 11.5–15.5)
WBC: 9.6 10*3/uL (ref 4.0–10.5)

## 2018-01-23 LAB — TROPONIN I
Troponin I: 0.19 ng/mL (ref <0.03)
Troponin I: 0.17 ng/mL (ref <0.03)
Troponin I: 0.16 ng/mL (ref <0.03)

## 2018-01-23 LAB — VANCOMYCIN, TROUGH: Vancomycin Tr: 14 ug/mL — ABNORMAL LOW (ref 15–20)

## 2018-01-23 LAB — SODIUM
SODIUM: 140 mmol/L (ref 135–145)
SODIUM: 138 mmol/L (ref 135–145)

## 2018-01-23 LAB — MAGNESIUM: Magnesium: 1.6 mg/dL — ABNORMAL LOW (ref 1.7–2.4)

## 2018-01-23 MED ORDER — POTASSIUM CHLORIDE 20 MEQ PO PACK
20.0000 meq | PACK | Freq: Two times a day (BID) | ORAL | Status: AC
  Administered 2018-01-23 – 2018-01-24 (×3): 20 meq via ORAL
  Filled 2018-01-23 (×3): qty 1

## 2018-01-23 MED ORDER — SODIUM CHLORIDE 3 % IV SOLN
INTRAVENOUS | Status: DC
  Filled 2018-01-23 (×2): qty 500

## 2018-01-23 MED ORDER — MAGNESIUM SULFATE 2 GM/50ML IV SOLN
2.0000 g | Freq: Once | INTRAVENOUS | Status: AC
  Administered 2018-01-23: 2 g via INTRAVENOUS
  Filled 2018-01-23: qty 50

## 2018-01-23 NOTE — Progress Notes (Signed)
OT Cancellation Note  Patient Details Name: [name] MRN: [id number] DOB: [birth year]   Cancelled Treatment:    Reason Eval/Treat Not Completed: Medical issues which prohibited therapy.  Pt intubated and minimally responsive.  Will check back first of next week to determine appropriateness.   [place], OTR/L [phone]   [name] 01/23/2018, 9:22 AM

## 2018-01-23 NOTE — Progress Notes (Signed)
PULMONARY / CRITICAL CARE MEDICINE   Name: [name] MRN: [id number] DOB: 1946-05-16    ADMISSION DATE:  12/24/2017 CONSULTATION DATE:  12/30/17  REFERRING MD:  Dr. Karie Kirks   CHIEF COMPLAINT:  SOB  BRIEF SUMMARY:   72 year old man with diabetes, chronic atrial fibrillation, thyrotoxicosis.  He was admitted for dyspnea and pulmonary edema, course complicated by encephalopathy and bilatearl subdural hematoma.  He had a reassuring LP.  He required mechanical ventilation for airway protection.  He has had bouts of atrial fibrillation with RVR, hypernatremia.  The evening of 4/24 he required reintubation for overt respiratory distress in the setting of decreased mental status.  There were purulent secretions in the trachea at the time of intubation. 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  Early this morning he was felt to be posturing in the LUE. He was started on 3% saline and a repeat CT showed a large infarct with minimal mass effect. He remains intubated and mechanically ventilated.  He is not at all interactive at the time of my exam.      VITAL SIGNS: BP 121/82   Pulse 80   Temp 98.9 F (37.2 C) (Oral)   Resp 19   Ht 5\' 9"  (1.753 m)   Wt 245 lb 9.5 oz (111.4 kg)   SpO2 100%   BMI 36.27 kg/m   HEMODYNAMICS:    VENTILATOR SETTINGS: Vent Mode: PRVC FiO2 (%):  [40 %] 40 % Set Rate:  [14 bmp] 14 bmp Vt Set:  [570 mL] 570 mL PEEP:  [5 cmH20] 5 cmH20 Plateau Pressure:  [12 cmH20-15 cmH20] 12 cmH20  INTAKE / OUTPUT: I/O last 3 completed shifts: In: 1385.1 [I.V.:510.1; NG/GT:125; IV Piggyback:750] Out: 8786 [Urine:1325]  PHYSICAL EXAMINATION: General: This is an elderly white male who is orally intubated and mechanically ventilated.  He is not in any overt distress.   Neuro: Exam is on 20 mcg of propofol.  There is no response to voice.  There is only partial eye opening to sternal rub.  Pupils are equal and reactive.  He has minimal movement on the  left in response to noxious stimuli he does not move the right.  I do not appreciate any posturing.  He has diffusely decreased DTRs.  CV: S1 and S2 are irregularly irregular without murmur rub or gallop.  The PMI is not remarkable.     PULM: He is only breathing 1 to 2 breaths above the set ventilator rate today.  Respirations are unlabored, there is symmetric air movement and a few scattered rhonchi, no wheezes     GI: The abdomen is obese and soft without any organomegaly masses tenderness guarding or rebound  Extremities: Trace anasarca   LABS:  BMET Recent Labs  Lab 01/21/18 0547 01/22/18 0524 01/23/18 0430  NA 142 139 138  K 3.8 4.0 3.1*  CL 108 107 101  CO2 26 24 25   BUN 22* 22* 20  CREATININE 0.92 1.08 0.94  GLUCOSE 151* 183* 181*    Electrolytes Recent Labs  Lab 01/18/18 0528 01/19/18 0515  01/21/18 0547 01/22/18 0524 01/23/18 0430  CALCIUM 9.4 10.0   < > 9.5 9.0 9.0  MG 1.8 1.8  --   --   --  1.6*  PHOS 2.9 2.7  --   --   --   --    < > = values in this interval not displayed.    CBC Recent Labs  Lab 01/21/18 0547 01/22/18 0524  01/23/18 0430  WBC 14.2* 16.8* 9.6  HGB 9.8* 10.2* 9.2*  HCT 31.4* 32.4* 29.1*  PLT 93* 92* 72*    Coag's No results for input(s): APTT, INR in the last 168 hours.  Sepsis Markers Recent Labs  Lab 01/20/18 0656 01/21/18 0547 01/22/18 0524  PROCALCITON <0.10 0.13 0.29    ABG Recent Labs  Lab 01/21/18 0913 01/21/18 1812 01/23/18 0409  PHART 7.454* 7.617* 7.479*  PCO2ART 37.0 27.7* 37.3  PO2ART 61.0* 209.0* 139*    Liver Enzymes Recent Labs  Lab 01/17/18 0845 01/21/18 0547 01/23/18 0430  AST 24 13* 13*  ALT 186* 68* 43  ALKPHOS 84 78 65  BILITOT 0.7 1.2 1.4*  ALBUMIN 1.9* 2.0* 1.8*    Cardiac Enzymes Recent Labs  Lab 01/22/18 1600 01/22/18 2206 01/23/18 0445  TROPONINI 0.17* 0.19* 0.19*    Glucose Recent Labs  Lab 01/22/18 1205 01/22/18 1530 01/22/18 2000 01/22/18 2344 01/23/18 0405  01/23/18 0801  GLUCAP 170* 188* 169* 184* 189* 167*    Imaging Ct Head Wo Contrast  Result Date: 01/23/2018 CLINICAL DATA:  Followup right brain stroke. EXAM: CT HEAD WITHOUT CONTRAST TECHNIQUE: Contiguous axial images were obtained from the base of the skull through the vertex without intravenous contrast. COMPARISON:  MRI yesterday.  CT 01/20/2018. FINDINGS: Brain: Acute infarction in the right superior insula and frontoparietal brain consistent with superior division MCA infarction shows more low-density and confluence swelling. No hemorrhagic transformation. Right-to-left shift of 1-2 mm. Thin right parieto-occipital subdural hematoma is unchanged, measuring about 6 mm in thickness. No new area of acute infarction is seen. No hydrocephalus. Vascular: Hyperdense right MCA branch in the insula. Atherosclerotic calcification of the major vessels at the base of the brain. Skull: Negative Sinuses/Orbits: Clear/normal Other: None significant IMPRESSION: Embolic occlusion of the right MCA branch in the insula as evidenced by hyperdense vessel with acute infarction in the deep insula and right frontoparietal brain. More low-density and swelling compared to the previous studies, but no hemorrhagic transformation or significant mass effect at this time. Electronically Signed   By: Nelson Chimes M.D.   On: 01/23/2018 07:27   Mr Brain Wo Contrast  Result Date: 01/22/2018 CLINICAL DATA:  Altered level of consciousness. EXAM: MRI HEAD WITHOUT CONTRAST TECHNIQUE: Multiplanar, multiecho pulse sequences of the brain and surrounding structures were obtained without intravenous contrast. COMPARISON:  CT head 01/20/2018, MRI 01/10/2018 FINDINGS: Brain: Large area of acute infarction in the superior division of the right middle cerebral artery involving the posterior insula and parietal lobe. There is thrombus in right M2 segment on gradient echo imaging. Patient has history of atrial fibrillation. Nonhemorrhagic  infarction. 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 No midline shift. Generalized atrophy without hydrocephalus. Vascular: Normal arterial flow voids. Skull and upper cervical spine: Negative Sinuses/Orbits: Negative Other: None IMPRESSION: Large territory acute infarct right posterior division of MCA. There is clot in the right M2 segment compatible with emboli. The patient has history of atrial fibrillation. Small subacute right sided subdural hematoma unchanged. No midline shift. These results were called by telephone at the time of interpretation on 01/22/2018 at 2:09 pm to Dr. Marolyn Hammock , who verbally acknowledged these results. Electronically Signed   By: Franchot Gallo M.D.   On: 01/22/2018 14:10     STUDIES:  CT Head 3/31 >> bilateral SDH's, R>L, minimal mass effect without significant midline shift MRI Brain  4/1 >> stable SDH's, trace leftward midline shift US Thyroid 4/4 >> 1.5 cm R TR 4 nodule, 1.4 cm left inferior T3 nodule EEG 4/7 >> consistent with mild generalized non-specific cerebral dysfunction.  No seizure or seizure predisposition recorded on this study.  ECHO 4/7 >> limited study to evaluate RV, normal LV function, severe bi-atrial enlargement, unable to estimate pulmonary pressure EEG 4/11 >> sedated EEG is abnormal with moderate diffuse background slowing LP 4/15 >> clear fluid, glucose 117, protein 74, RBC 1 CT Head 4/10 >> right hemispheric SDH appears stable, no new areas of ICH, edema or mass effect  CT Head 4/23 >> no acute findings, expected decrease in known R SDH  CULTURES: BCx2 3/29 >> coag neg staph 1/2 bottles  BCx2 4/1 >> negative  Sputum 4/9 >> Klebsiella pneumoniae >> R-ampicillin, otherwise sensitive  BCx2 4/6 >> negative  BCx2 4/9 >> coag neg staph 2/2  CSF 4/15 >> negative  CSF Fungal 4/15 >>   ANTIBIOTICS: Zosyn 3/30>> 4/5, restart 4/6 Vanc 3/31  >> 4/3  SIGNIFICANT EVENTS: 3/29  Admit  4/02  Tx to ICU for resp fx, AMS, AFwRVR, intubated 4/05  Extubated, tx out of ICU 4/06  Returned to ICU, bipap 4/15  LP 4/21  Extubated 4/22  N/V, cortrak placed 4/23  Increased secretions > NTS.  N/V overnight. TF stopped 4/24  Drowsy.   4/24  reintubated  LINES/TUBES: RUE PICC 3/30 >>  ETT 4/2 >> 4/5 ETT 4/9 >> 4/21  DISCUSSION: 72 year old with DM II, chronic atrial fibrillation (previously on anticoagulation), CKD III who was initially admitted with dyspnea on exertion.  He was treated for CHF and found to have thyrotoxicosis which was treated.  He subsequently has had intermittent seizures and depressed mental status.  Found to have bilateral R>L SDH.  MRI with concern for enhancement of the meninges > LP performed.  4/21 much more awake down sedation has been held, extubated.    ASSESSMENT / PLAN:  PULMONARY A: He is requiring intubation for airway protection due to decreased mental status.  He also is suffering from a Klebsiella pneumonia.  I will continue to treat him with Zosyn for the present pending sensitivities on the Klebsiella.  It should be noted that we have seen ESBL producing Klebsiella on this unit however he seems to be responding to current therapy.  CARDIOVASCULAR A:  Atrial Fibrillation - not a candidate for anticoagulation due to SDH CHF - on admit Been converted to enteral Lopressor for rate control and anticipate weaning off of esmolol entirely today     RENAL Potassium and magnesium replacement have been ordered GASTROINTESTINAL A:   Elevated LFT's - ? Methimazole, Amiodarone  Hx Adenocarcinoma s/p Resection with Colostomy - 2010 P:   NPO / Coretrak  SLP efforts as able  PRN zofran  Reglan 5mg  IV Q6x4 doses  Consider restart TF in am 4/25 if no further vomiting (none since 4/23 am) Follow LFT's intermittently  I am concerned that both his calcium channel blocker and overcorrection of his  hypothyroidism have contributed to ileus.  Both of these issues are being addressed.  HEMATOLOGIC A:   Anemia  P:  Platelet count is dropping.  He has been on no form of heparin.  He is suffering from a Klebsiella infection and I suspect that that is the provocation however if the trend continues will need to start substituting medications. SCD's for DVT prophylaxis      INFECTIOUS A:   R/O Aspiration  PNA - 4/22, 4/23 with n/v P:   Vancomycin  and Zosyn were initiated following intubation on 4/24, sputum is growing Klebsiella.  If no gram-positive grow out the vancomycin will be discontinued and the Zosyn will be adjusted pending sensitivity.      ENDOCRINE A:   Thyrotoxicosis  DM with Hyperglycemia    P:   SSI  Based on his most recent free T4 he appears to be over treated.  I decreased his dose of methimazole and will continue to intermittently monitor free T3 and free T4   NEUROLOGIC A:   Mental status has declined over the past several days and it appears that he is suffered from an episode of aspiration.  He has suffered from a significant right MCA territory stroke.  I discussed this with his wife this morning and she is contacting other family members to discuss the appropriate level of care.  She tells me that he was normally a very active outdoors person and suspects that he would not be content living with a hemiplegia.   Rater than 32 minutes was spent in the care of this unstable patient today Lars Masson, MD Critical Care Pgr: [phone] or if no answer [phone] 01/23/2018, 8:11 AM

## 2018-01-23 NOTE — Progress Notes (Signed)
Pharmacy Antibiotic Note  [name] is a 72 y.o. male admitted on 12/08/2017 with pneumonia.  Pharmacy has been consulted for vancomycin and zosyn dosing. WBC 9.6. A 12-hr vanc trough collected today was slightly sub-therapeutic at 14. Anticipate level to increase after a few more doses.   Plan: -Continue vancomycin 1 gm IV Q 12 hours -Continue Zosyn 3.375 gm IV Q 8 hours (EI infusion) -Monitor cultures and clinical progress  Height: 5\' 9"  (175.3 cm) Weight: 245 lb 9.5 oz (111.4 kg) IBW/kg (Calculated) : 70.7  Temp (24hrs), Avg:99.2 F (37.3 C), Min:98.9 F (37.2 C), Max:99.6 F (37.6 C)  Recent Labs  Lab 01/19/18 0515 01/20/18 0549 01/21/18 0547 01/22/18 0524 01/23/18 0430 01/23/18 1105  WBC 22.2* 22.7* 14.2* 16.8* 9.6  --   CREATININE 0.86 0.81 0.92 1.08 0.94  --   VANCOTROUGH  --   --   --   --   --  14*    Estimated Creatinine Clearance: 91.3 mL/min (by C-G formula based on SCr of 0.94 mg/dL).    No Known Allergies  Antimicrobials this admission: Zosyn 4/24 >>  Vanc 4/24 >>   Dose adjustments this admission: 4/26 VT 14 (12 hr) - on vanc 1 gm IV Q 12h - continue   Microbiology results: 4/24 TA > klebsiella pna   Thank you for allowing pharmacy to be a part of this patient's care.  Albertina Parr, PharmD., BCPS Clinical Pharmacist Clinical phone for 01/23/18 until 3:30pm: [phone] If after 3:30pm, please call main pharmacy at: [phone]

## 2018-01-23 NOTE — Progress Notes (Signed)
[facility] MD made aware of troponin value. Order to reorder troponin series.

## 2018-01-23 NOTE — Progress Notes (Signed)
STROKE TEAM PROGRESS NOTE   SUBJECTIVE (INTERVAL HISTORY) His wife is at the bedside. Pt still intubated, unresponsive. Had CT head repeat this am right MCA infarct without midline shift or significant mass effect. I had long discussion with wife at bedside, updated pt current condition, treatment plan and poor prognosis. She expressed understanding and would like to talk to her brother in law for further decision.    OBJECTIVE Temp:  [98.9 F (37.2 C)-99.6 F (37.6 C)] 99.6 F (37.6 C) (04/26 0800) Pulse Rate:  [70-120] 87 (04/26 0752) Cardiac Rhythm: Atrial fibrillation (04/25 2000) Resp:  [13-27] 14 (04/26 0752) BP: (101-154)/(63-129) 110/71 (04/26 0752) SpO2:  [97 %-100 %] 99 % (04/26 0752) FiO2 (%):  [40 %] 40 % (04/26 0752) Weight:  [245 lb 9.5 oz (111.4 kg)] 245 lb 9.5 oz (111.4 kg) (04/26 0500)  Recent Labs  Lab 01/22/18 1530 01/22/18 2000 01/22/18 2344 01/23/18 0405 01/23/18 0801  GLUCAP 188* 169* 184* 189* 167*   Recent Labs  Lab 01/17/18 0845  01/18/18 0528 01/19/18 0515 01/20/18 0549 01/21/18 0547 01/22/18 0524 01/23/18 0430  NA 140   < > 138 137 141 142 139 138  K 4.5   < > 4.1 3.7 3.8 3.8 4.0 3.1*  CL 110   < > 107 105 106 108 107 101  CO2 25   < > 24 25 25 26 24 25   GLUCOSE 207*   < > 251* 263* 201* 151* 183* 181*  BUN 56*   < > 57* 31* 22* 22* 22* 20  CREATININE 0.87   < > 0.88 0.86 0.81 0.92 1.08 0.94  CALCIUM 9.6   < > 9.4 10.0 9.9 9.5 9.0 9.0  MG 1.8  --  1.8 1.8  --   --   --  1.6*  PHOS  --   --  2.9 2.7  --   --   --   --    < > = values in this interval not displayed.   Recent Labs  Lab 01/17/18 0845 01/21/18 0547 01/23/18 0430  AST 24 13* 13*  ALT 186* 68* 43  ALKPHOS 84 78 65  BILITOT 0.7 1.2 1.4*  PROT 4.8* 4.5* 4.5*  ALBUMIN 1.9* 2.0* 1.8*   Recent Labs  Lab 01/17/18 0500 01/18/18 0528 01/19/18 0515 01/20/18 0549 01/21/18 0547 01/22/18 0524 01/23/18 0430  WBC 10.8* 11.6* 22.2* 22.7* 14.2* 16.8* 9.6  NEUTROABS 10.3*  11.1* 22.0*  --   --   --  8.7*  HGB 9.1* 8.8* 10.5* 10.9* 9.8* 10.2* 9.2*  HCT 30.0* 28.4* 33.3* 34.8* 31.4* 32.4* 29.1*  MCV 86.5 85.3 84.5 86.4 86.3 86.4 85.1  PLT 133* 114* 174 146* 93* 92* 72*   Recent Labs  Lab 01/22/18 0845 01/22/18 1600 01/22/18 2206 01/23/18 0445  TROPONINI 0.19* 0.17* 0.19* 0.19*   No results for input(s): LABPROT, INR in the last 72 hours. No results for input(s): COLORURINE, LABSPEC, [place], GLUCOSEU, HGBUR, BILIRUBINUR, KETONESUR, PROTEINUR, UROBILINOGEN, NITRITE, LEUKOCYTESUR in the last 72 hours.  Invalid input(s): APPERANCEUR     Component Value Date/Time   TRIG 123 01/21/2018 1809   Lab Results  Component Value Date   HGBA1C 6.1 (H) 12/29/2017      Component Value Date/Time   LABOPIA POSITIVE (A) 12/04/2017 2110   COCAINSCRNUR NONE DETECTED 12/28/2017 2110   LABBENZ NONE DETECTED 12/28/2017 2110   AMPHETMU NONE DETECTED 12/02/2017 2110   THCU NONE DETECTED 12/13/2017 2110   LABBARB NONE DETECTED 12/12/2017 2110  No results for input(s): ETH in the last 168 hours.  I have personally reviewed the radiological images below and agree with the radiology interpretations.  EEG The EEG is abnormal findings are suggestive of moderate to severe generalized cerebral dysfunction. Epileptiform features were not seen during this recording.  Dg Chest Port 1 View 01/22/2018 IMPRESSION:  1.  Lines and tubes in stable position.  2. Persistent cardiomegaly. Persistent right base infiltrate/edema. Progressive left base infiltrate/edema. Small left pleural effusion. A component of CHF may be present. Pneumonia cannot be excluded.   Ct Head Wo Contrast  Result Date: 01/23/2018 CLINICAL DATA:  Followup right brain stroke. EXAM: CT HEAD WITHOUT CONTRAST TECHNIQUE: Contiguous axial images were obtained from the base of the skull through the vertex without intravenous contrast. COMPARISON:  MRI yesterday.  CT 01/20/2018. FINDINGS: Brain: Acute infarction  in the right superior insula and frontoparietal brain consistent with superior division MCA infarction shows more low-density and confluence swelling. No hemorrhagic transformation. Right-to-left shift of 1-2 mm. Thin right parieto-occipital subdural hematoma is unchanged, measuring about 6 mm in thickness. No new area of acute infarction is seen. No hydrocephalus. Vascular: Hyperdense right MCA branch in the insula. Atherosclerotic calcification of the major vessels at the base of the brain. Skull: Negative Sinuses/Orbits: Clear/normal Other: None significant IMPRESSION: Embolic occlusion of the right MCA branch in the insula as evidenced by hyperdense vessel with acute infarction in the deep insula and right frontoparietal brain. More low-density and swelling compared to the previous studies, but no hemorrhagic transformation or significant mass effect at this time. Electronically Signed   By: Nelson Chimes M.D.   On: 01/23/2018 07:27   Mr Brain Wo Contrast  Result Date: 01/22/2018 CLINICAL DATA:  Altered level of consciousness. EXAM: MRI HEAD WITHOUT CONTRAST TECHNIQUE: Multiplanar, multiecho pulse sequences of the brain and surrounding structures were obtained without intravenous contrast. COMPARISON:  CT head 01/20/2018, MRI 01/10/2018 FINDINGS: Brain: Large area of acute infarction in the superior division of the right middle cerebral artery involving the posterior insula and parietal lobe. There is thrombus in right M2 segment on gradient echo imaging. Patient has history of atrial fibrillation. Nonhemorrhagic infarction. 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 No midline shift. Generalized atrophy without hydrocephalus. Vascular: Normal arterial flow voids. Skull and upper cervical spine: Negative Sinuses/Orbits: Negative Other: None IMPRESSION: Large territory acute infarct right posterior division  of MCA. There is clot in the right M2 segment compatible with emboli. The patient has history of atrial fibrillation. Small subacute right sided subdural hematoma unchanged. No midline shift. These results were called by telephone at the time of interpretation on 01/22/2018 at 2:09 pm to Dr. Marolyn Hammock , who verbally acknowledged these results. Electronically Signed   By: Franchot Gallo M.D.   On: 01/22/2018 14:10    PHYSICAL EXAM  Temp:  [98.9 F (37.2 C)-99.6 F (37.6 C)] 99.6 F (37.6 C) (04/26 0800) Pulse Rate:  [70-120] 87 (04/26 0752) Resp:  [13-27] 14 (04/26 0752) BP: (101-154)/(63-129) 110/71 (04/26 0752) SpO2:  [97 %-100 %] 99 % (04/26 0752) FiO2 (%):  [40 %] 40 % (04/26 0752) Weight:  [245 lb 9.5 oz (111.4 kg)] 245 lb 9.5 oz (111.4 kg) (04/26 0500)  General - Well nourished, well developed, intubated.  Ophthalmologic - fundi not visualized due to constant eye movement.  Cardiovascular - irregularly irregular heart rate and rhythm.  Neuro -  intubated not on sedation, eyes closed and not following commands or open eyes on stimulation. PERRL, eye constant moving from left to right, right end gaze unsustained nystagmus. Not blinking to visual threat, not tracking, or eye fixation. Facial symmetry not able to test due to ET tube. Tongue midline in mouth. On pain stimulation, he frowns and RUE 2/5 movement, and trace distal withdraw BLEs, flaccid on LUE. DTR diminished and no babinski. Sensation, coordination and gait not tested.  ASSESSMENT/PLAN 72 y.o. male with complicated and extensive medical conditions including HTN, EtOH abuse, COPD, DM, afib previously on Coumadin, thyrotoxicosis, CHF, colon cancer s/p colostomy, CKD III, and renal cell carcinoma s/p resection who was admitted on 12/17/2017 with SOB, low INR, afib RVR and pulmonary edema. He was found to have bilateral subdural hematomas, R>L. Anticoagulation on hold. Possible seizure activity on keppra although EEG showed  diffuse slowing. Recent mental decline, was reintubated and MRI showed large right MCA infarct, likely due to afib not on AC due to SDH. EEG no seizure but severe cerebral dysfunction.    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 SDH has been 3 weeks out and CT showed subacute now, put on ASA 81mg .   Ongoing aggressive stroke risk factor management  Therapy recommendations:  pending  Disposition:  Pending  Had long discussion with daughter Caryl Asp over the phone 01/22/18. She said pt had expressed to her a year ago that if pt becomes a "drooling idiot" in nursing home then he does not live like that. Daughter stated that pt not very compliant with medication at home and she was not surprised that someday pt would ends up with medically critical condition.   Talked with wife today at bedside. Reviewed neuroimages with her, updated pt condition and discussed poor prognosis. She would like to have more family discussion before making decisions  Cerebral edema  CT 01/23/18 Focal mass effect, no midline shift  Do not think 3% indicated at this time  Continue monitoring  Wife is considering palliative care now  Persistent AFib with RVR  On esmolol drip  HR 100s  On ASA  Hold off AC due to large infarct and recent SDH  Thyrotoxicosis   On methimazole   Low TSH  FT4 normalizing   FT3 still low  SDH with seizure like activity  Initially CT right acute SDH, left chronic small SDH  Repeat CT now left chronic small SDH resolved and right SDH became  subacute  Repeat CT 01/23/18 right subacute SDH stable  EEG no seizure  On ASA 81 given right MCA infarct  Continue keppra for now  Fever with pneumonia  Tmax 102.2 -> afebrile  CXR bilateral infiltration  LP 01/12/18 - no CNS infection  Intubated   On zosyn and vanco  Diabetes  HgbA1c 6.1 goal < 7.0  Controlled  Mild hyperglycemia   CBG monitoring  SSI  Hypertension  Stable  Permissive hypertension (OK if <180/105) for 24-48 hours post stroke and then gradually normalized within 5-7 days.  Long term  BP goal normotensive  Other Stroke Risk Factors  Advanced age  Obesity, Body mass index is 37.11 kg/m.   CHF - cardiomegaly   Other Active Problems  Encephalopathy  Colon cancer s/p colostomy   Renal cell carcinoma s/p resection   Hospital day # 28  This patient is critically ill due to large stroke, afib RVR, pneumonia, SDH, CHF, fever, thyrotoxicosis, and at significant risk of neurological worsening, death form recurrent stroke, hemorrhagic conversion, heart failure, sepsis, septic shock. This patient's care requires constant monitoring of vital signs, hemodynamics, respiratory and cardiac monitoring, review of multiple databases, neurological assessment, discussion with family, other specialists and medical decision making of high complexity. I spent 45 minutes of neurocritical care time in the care of this patient. I had long discussion with wife at the bedside, reviewed neuro images, updated pt current condition, treatment plan and poor prognosis. She expressed understanding and would like to discuss with other family members before decision.    Rosalin Hawking, MD PhD Stroke Neurology 01/23/2018 10:40 AM    To contact Stroke Continuity provider, please refer to [email or web address] After hours, contact General Neurology

## 2018-01-23 NOTE — Progress Notes (Signed)
On assessment, appears pt is extending LUE to pain. CT ordered. 3% saline ordered at rate of 83mls/hr.

## 2018-01-23 NOTE — Plan of Care (Signed)
Prognosis discussed with wife at bedside today. She is going to converse with other family with plan of continued care.

## 2018-01-24 ENCOUNTER — Encounter (HOSPITAL_COMMUNITY): Payer: Self-pay

## 2018-01-24 LAB — BASIC METABOLIC PANEL
Anion gap: 7 (ref 5–15)
BUN: 18 mg/dL (ref 6–20)
CALCIUM: 9.1 mg/dL (ref 8.9–10.3)
CO2: 25 mmol/L (ref 22–32)
CREATININE: 0.84 mg/dL (ref 0.61–1.24)
Chloride: 106 mmol/L (ref 101–111)
GFR calc Af Amer: >60 mL/min (ref >60)
GFR calc non Af Amer: >60 mL/min (ref >60)
Glucose, Bld: 171 mg/dL — ABNORMAL HIGH (ref 65–99)
Potassium: 3.4 mmol/L — ABNORMAL LOW (ref 3.5–5.1)
Sodium: 138 mmol/L (ref 135–145)

## 2018-01-24 LAB — CBC WITH DIFFERENTIAL/PLATELET
BASOS PCT: 0 %
Basophils Absolute: 0 10*3/uL (ref 0.0–0.1)
EOS ABS: 0.1 10*3/uL (ref 0.0–0.7)
EOS PCT: 1 %
HEMATOCRIT: 32 % — AB (ref 39.0–52.0)
Hemoglobin: 10 g/dL — ABNORMAL LOW (ref 13.0–17.0)
Lymphocytes Relative: 7 %
Lymphs Abs: 0.7 10*3/uL (ref 0.7–4.0)
MCH: 26.3 pg (ref 26.0–34.0)
MCHC: 31.3 g/dL (ref 30.0–36.0)
MCV: 84.2 fL (ref 78.0–100.0)
MONO ABS: 0.2 10*3/uL (ref 0.1–1.0)
MONOS PCT: 2 %
NEUTROS ABS: 8.5 10*3/uL — AB (ref 1.7–7.7)
Neutrophils Relative %: 90 %
Platelets: 71 10*3/uL — ABNORMAL LOW (ref 150–400)
RBC: 3.8 MIL/uL — ABNORMAL LOW (ref 4.22–5.81)
RDW: 19.2 % — ABNORMAL HIGH (ref 11.5–15.5)
WBC: 9.5 10*3/uL (ref 4.0–10.5)

## 2018-01-24 LAB — CULTURE, RESPIRATORY W GRAM STAIN

## 2018-01-24 LAB — GLUCOSE, CAPILLARY
GLUCOSE-CAPILLARY: 160 mg/dL — AB (ref 65–99)
GLUCOSE-CAPILLARY: 143 mg/dL — AB (ref 65–99)
GLUCOSE-CAPILLARY: 147 mg/dL — AB (ref 65–99)
Glucose-Capillary: 124 mg/dL — ABNORMAL HIGH (ref 65–99)
Glucose-Capillary: 165 mg/dL — ABNORMAL HIGH (ref 65–99)
Glucose-Capillary: 185 mg/dL — ABNORMAL HIGH (ref 65–99)

## 2018-01-24 LAB — SODIUM
SODIUM: 135 mmol/L (ref 135–145)
Sodium: 136 mmol/L (ref 135–145)
Sodium: 137 mmol/L (ref 135–145)

## 2018-01-24 MED ORDER — POTASSIUM CHLORIDE IN NACL 20-0.9 MEQ/L-% IV SOLN
INTRAVENOUS | Status: DC
  Administered 2018-01-24 – 2018-01-28 (×6): via INTRAVENOUS
  Filled 2018-01-24 (×8): qty 1000

## 2018-01-24 MED ORDER — SODIUM CHLORIDE 0.9 % IV SOLN
1.0000 g | INTRAVENOUS | Status: DC
  Administered 2018-01-24 – 2018-01-30 (×7): 1 g via INTRAVENOUS
  Filled 2018-01-24 (×7): qty 10

## 2018-01-24 MED ORDER — PRO-STAT SUGAR FREE PO LIQD
30.0000 mL | Freq: Two times a day (BID) | ORAL | Status: DC
  Administered 2018-01-24 – 2018-01-30 (×13): 30 mL
  Filled 2018-01-24 (×13): qty 30

## 2018-01-24 MED ORDER — ACETAMINOPHEN 160 MG/5ML PO SOLN
650.0000 mg | Freq: Four times a day (QID) | ORAL | Status: DC | PRN
  Administered 2018-01-24: 650 mg via ORAL
  Filled 2018-01-24: qty 20.3

## 2018-01-24 MED ORDER — METOPROLOL TARTRATE 25 MG PO TABS
25.0000 mg | ORAL_TABLET | Freq: Four times a day (QID) | ORAL | Status: DC
  Administered 2018-01-24 – 2018-01-30 (×25): 25 mg via ORAL
  Filled 2018-01-24 (×25): qty 1

## 2018-01-24 MED ORDER — VITAL HIGH PROTEIN PO LIQD
1000.0000 mL | ORAL | Status: DC
  Administered 2018-01-24 – 2018-01-29 (×7): 1000 mL
  Filled 2018-01-24 (×3): qty 1000

## 2018-01-24 MED ORDER — ACETAMINOPHEN 650 MG RE SUPP: 650.0000 mg | Freq: Four times a day (QID) | RECTAL | Status: DC | PRN

## 2018-01-24 MED ORDER — POTASSIUM CHLORIDE 20 MEQ PO PACK
20.0000 meq | PACK | Freq: Two times a day (BID) | ORAL | Status: AC
  Administered 2018-01-24 (×2): 20 meq via ORAL
  Filled 2018-01-24 (×2): qty 1

## 2018-01-24 NOTE — Progress Notes (Signed)
PULMONARY / CRITICAL CARE MEDICINE   Name: [name] MRN: [id number] DOB: Mar 10, 1946    ADMISSION DATE:  12/22/2017 CONSULTATION DATE:  12/30/17  REFERRING MD:  Dr. Karie Kirks   CHIEF COMPLAINT:  SOB  BRIEF SUMMARY:   72 year old man with diabetes, chronic atrial fibrillation, thyrotoxicosis.  He was admitted for dyspnea and pulmonary edema, course complicated by encephalopathy and bilatearl subdural hematoma.  He had a reassuring LP.  He required mechanical ventilation for airway protection.  He has had bouts of atrial fibrillation with RVR, hypernatremia.  The evening of 4/24 he required reintubation for overt respiratory distress in the setting of decreased mental status.  There were purulent secretions in the trachea at the time of intubation. 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  He is on no sedation at the time of my examination today.  He is not at all interactive.  He continues on a Cardizem drip for control of his rate.  He remains intubated and mechanically ventilated.      VITAL SIGNS: BP (!) 146/87   Pulse (!) 106   Temp 99.8 F (37.7 C) (Axillary)   Resp (!) 24   Ht 5\' 9"  (1.753 m)   Wt 246 lb 14.6 oz (112 kg)   SpO2 99%   BMI 36.46 kg/m   HEMODYNAMICS:    VENTILATOR SETTINGS: Vent Mode: PRVC FiO2 (%):  [30 %-40 %] 30 % Set Rate:  [14 bmp-18 bmp] 18 bmp Vt Set:  [570 mL] 570 mL PEEP:  [5 cmH20] 5 cmH20 Plateau Pressure:  [14 cmH20-18 cmH20] 14 cmH20  INTAKE / OUTPUT: I/O last 3 completed shifts: In: 1187.4 [I.V.:92.4; Other:120; NG/GT:225; IV Piggyback:750] Out: 409 [Urine:826]  PHYSICAL EXAMINATION: General: This is an elderly white male who is orally intubated and mechanically ventilated.  He is in no overt distress.   Neuro: Exam today is on no sedation.  There is no response to voice.  There is movement of the right upper extremity in response to sternal rub.  There is no eye opening.  Pupils are equal.  EOMs are roving.    CV: S1 and S2 irregularly irregular without murmur rub or gallop.       PULM: Respirations are unlabored.  There is symmetric air movement, no wheezes and very few scattered rhonchi.    GI: Abdomen is obese soft and nontender.  There is no organomegaly.   Extremities: Trace anasarca   LABS:  BMET Recent Labs  Lab 01/22/18 0524 01/23/18 0430 01/23/18 1105 01/23/18 1550 01/24/18 0309  NA 139 138 140 138 138  K 4.0 3.1*  --   --  3.4*  CL 107 101  --   --  106  CO2 24 25  --   --  25  BUN 22* 20  --   --  18  CREATININE 1.08 0.94  --   --  0.84  GLUCOSE 183* 181*  --   --  171*    Electrolytes Recent Labs  Lab 01/18/18 0528 01/19/18 0515  01/22/18 0524 01/23/18 0430 01/24/18 0309  CALCIUM 9.4 10.0   < > 9.0 9.0 9.1  MG 1.8 1.8  --   --  1.6*  --   PHOS 2.9 2.7  --   --   --   --    < > = values in this interval not displayed.    CBC Recent Labs  Lab 01/22/18 0524 01/23/18 0430 01/24/18 0309  WBC 16.8* 9.6  9.5  HGB 10.2* 9.2* 10.0*  HCT 32.4* 29.1* 32.0*  PLT 92* 72* 71*    Coag's No results for input(s): APTT, INR in the last 168 hours.  Sepsis Markers Recent Labs  Lab 01/20/18 0656 01/21/18 0547 01/22/18 0524  PROCALCITON <0.10 0.13 0.29    ABG Recent Labs  Lab 01/21/18 0913 01/21/18 1812 01/23/18 0409  PHART 7.454* 7.617* 7.479*  PCO2ART 37.0 27.7* 37.3  PO2ART 61.0* 209.0* 139*    Liver Enzymes Recent Labs  Lab 01/21/18 0547 01/23/18 0430  AST 13* 13*  ALT 68* 43  ALKPHOS 78 65  BILITOT 1.2 1.4*  ALBUMIN 2.0* 1.8*    Cardiac Enzymes Recent Labs  Lab 01/23/18 0445 01/23/18 1105 01/23/18 1550  TROPONINI 0.19* 0.17* 0.16*    Glucose Recent Labs  Lab 01/23/18 1133 01/23/18 1528 01/23/18 2040 01/24/18 0024 01/24/18 0408 01/24/18 0741  GLUCAP 171* 198* 192* 185* 160* 124*    Imaging No results found.   STUDIES:  CT Head 3/31 >> bilateral SDH's, R>L, minimal mass effect without significant midline shift MRI  Brain 4/1 >> stable SDH's, trace leftward midline shift US Thyroid 4/4 >> 1.5 cm R TR 4 nodule, 1.4 cm left inferior T3 nodule EEG 4/7 >> consistent with mild generalized non-specific cerebral dysfunction.  No seizure or seizure predisposition recorded on this study.  ECHO 4/7 >> limited study to evaluate RV, normal LV function, severe bi-atrial enlargement, unable to estimate pulmonary pressure EEG 4/11 >> sedated EEG is abnormal with moderate diffuse background slowing LP 4/15 >> clear fluid, glucose 117, protein 74, RBC 1 CT Head 4/10 >> right hemispheric SDH appears stable, no new areas of ICH, edema or mass effect  CT Head 4/23 >> no acute findings, expected decrease in known R SDH  CULTURES: BCx2 3/29 >> coag neg staph 1/2 bottles  BCx2 4/1 >> negative  Sputum 4/9 >> Klebsiella pneumoniae >> R-ampicillin, otherwise sensitive  BCx2 4/6 >> negative  BCx2 4/9 >> coag neg staph 2/2  CSF 4/15 >> negative  CSF Fungal 4/15 >>   ANTIBIOTICS: Zosyn 3/30>> 4/5, restart 4/6 Vanc 3/31 >> 4/3  SIGNIFICANT EVENTS: 3/29  Admit  4/02  Tx to ICU for resp fx, AMS, AFwRVR, intubated 4/05  Extubated, tx out of ICU 4/06  Returned to ICU, bipap 4/15  LP 4/21  Extubated 4/22  N/V, cortrak placed 4/23  Increased secretions > NTS.  N/V overnight. TF stopped 4/24  Drowsy.   4/24  reintubated  LINES/TUBES: RUE PICC 3/30 >>  ETT 4/2 >> 4/5 ETT 4/9 >> 4/21  DISCUSSION: 72 year old with DM II, chronic atrial fibrillation (previously on anticoagulation), CKD III who was initially admitted with dyspnea on exertion.  He was treated for CHF and found to have thyrotoxicosis which was treated.  He subsequently has had intermittent seizures and depressed mental status.  Found to have bilateral R>L SDH.  MRI with concern for enhancement of the meninges > LP performed.  4/21 much more awake down sedation has been held, extubated.  Until status deteriorated on 4/24 requiring reintubation.  An MRI showed a new  right MCA territory infarct.   ASSESSMENT / PLAN:  PULMONARY A: He continues to be intubated for airway protection due to his decreased mental status.  There was a suspicion of aspiration at the time of intubation and he is again growing Klebsiella pneumonia from his sputum.  Is essentially a pansensitive agent and I have converted him from Zosyn to Rocephin.  There is  no prospect for extubation with his current mental status.  CARDIOVASCULAR A:  Atrial Fibrillation - not a candidate for anticoagulation due to SDH CHF - on admit I have increased his dose of Lopressor hoping to wean him off of diltiazem and manage his rate with a single agent.     RENAL Potassium and magnesium replacement have been ordered GASTROINTESTINAL A: Prophylaxis is with Pepcid however I note him to be thrombocytopenic if this is persistent he will be switched to a PPI  HEMATOLOGIC A:   Anemia  P:  Platelet count is dropping.  He has been on no form of heparin.  He is suffering from a Klebsiella infection and I suspect that that is the provocation however if the trend continues will need to start substituting medications. SCD's for DVT prophylaxis      INFECTIOUS A:   R/O Aspiration PNA - 4/22, 4/23 with n/v P:   Vancomycin  and Zosyn were initiated following intubation on 4/24, sputum is growing Klebsiella, and antibiotic was therefore narrowed to Rocephin alone on 4/27      ENDOCRINE A:   Thyrotoxicosis  DM with Hyperglycemia    P:   SSI  Based on his most recent free T4 he appears to be over treated.  I decreased his dose of methimazole and will continue to intermittently monitor free T3 and free T4   NEUROLOGIC A:   His mental status is extremely poor.  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  She is convinced that he would not enjoy a sedentary lifestyle with activity limited by  hemiplegia.  He would like to observe him for another 24 hours before making a decision as to disposition.  I have ordered a repeat head CT for the morning of 4/28 and laboratories to rule out a metabolic component to his decreased level of consciousness including a free T4.  I will again discuss an appropriate level of care with her tomorrow.  Lars Masson, MD Critical Care Pgr: [phone] or if no answer [phone] 01/24/2018, 10:43 AM

## 2018-01-24 NOTE — Plan of Care (Signed)
Cont plan of care

## 2018-01-24 NOTE — Progress Notes (Signed)
Nutrition Follow-up  DOCUMENTATION CODES:   Obesity unspecified  INTERVENTION:  Initiate Vital High Protein @ 25 ml/hr via Cortrak NGT and increase by 10 ml every 4 hours to goal rate of 55 ml/hr.   30 ml Prostat BID.    Tube feeding regimen provides 1520 kcal (100% of needs), 146 grams of protein, and 1009 ml of H2O.   NUTRITION DIAGNOSIS:   Inadequate oral intake related to acute illness as evidenced by NPO status; ongoing  GOAL:   Patient will meet greater than or equal to 90% of their needs; not met  MONITOR:   TF tolerance, Labs, Weight trends, Skin  REASON FOR ASSESSMENT:   Consult, Ventilator Enteral/tube feeding initiation and management  ASSESSMENT:    72 yo male admitted with acute respiratory failure with acute pulmonary edem and possible aspiration pneumonia; pt also with acute metabolic encephalopathy, bilateral subdural hematomas, seizures and thyroid storm. Pt with hx of CKD III, DM, adenocarcinoma of colon s/p resection and colostomy, renal cell carcinoma    Patient is currently intubated on ventilator support MV: 10.6 L/min Temp (24hrs), Avg:99.3 F (37.4 C), Min:98.1 F (36.7 C), Max:99.8 F (37.7 C)  Propofol: none  Pt re-intubated 4/24.  Per RN, tube feeds have not been restarted since pt re-intubated. RD given verbal consent to restart tube feeds via Dr. Pearline Cables, MD. RD to modify tube feeding orders. Pt with recent ileus, plan to initiate TF at 25 ml/hr and advance by 10 ml q 4 hour to goal rate.   Ongoing discussion with family regarding pt's goals of care. Family would like to observe pt for another 24 hours prior to making a decision as to disposition.   Labs and medications reviewed.   Diet Order:  Diet NPO time specified  EDUCATION NEEDS:   Not appropriate for education at this time  Skin:  Skin Assessment: Reviewed RN Assessment  Last BM:  4/27 colostomy  Height:   Ht Readings from Last 1 Encounters:  01/16/18 '5\' 9"'$  (1.753 m)     Weight:   Wt Readings from Last 1 Encounters:  01/24/18 246 lb 14.6 oz (112 kg)    Ideal Body Weight:  72.7 kg  BMI:  Body mass index is 36.46 kg/m.  Estimated Nutritional Needs:   Kcal:  8719-5974  Protein:  >/= 145 grams  Fluid:  Per MD    Corrin Parker, MS, RD, LDN Pager # [phone] After hours/ weekend pager # [phone]

## 2018-01-25 ENCOUNTER — Inpatient Hospital Stay (HOSPITAL_COMMUNITY): Payer: Medicare Other

## 2018-01-25 LAB — GLUCOSE, CAPILLARY
GLUCOSE-CAPILLARY: 240 mg/dL — AB (ref 65–99)
GLUCOSE-CAPILLARY: 242 mg/dL — AB (ref 65–99)
GLUCOSE-CAPILLARY: 251 mg/dL — AB (ref 65–99)
Glucose-Capillary: 167 mg/dL — ABNORMAL HIGH (ref 65–99)
Glucose-Capillary: 188 mg/dL — ABNORMAL HIGH (ref 65–99)
Glucose-Capillary: 196 mg/dL — ABNORMAL HIGH (ref 65–99)
Glucose-Capillary: 233 mg/dL — ABNORMAL HIGH (ref 65–99)

## 2018-01-25 LAB — POCT I-STAT 3, ART BLOOD GAS (G3+)
BICARBONATE: 23.6 mmol/L (ref 20.0–28.0)
O2 Saturation: 98 %
PCO2 ART: 33.5 mmHg (ref 32.0–48.0)
PO2 ART: 106 mmHg (ref 83.0–108.0)
Patient temperature: 98.6
TCO2: 25 mmol/L (ref 22–32)
pH, Arterial: 7.457 — ABNORMAL HIGH (ref 7.350–7.450)

## 2018-01-25 LAB — BASIC METABOLIC PANEL
ANION GAP: 7 (ref 5–15)
BUN: 23 mg/dL — ABNORMAL HIGH (ref 6–20)
CO2: 25 mmol/L (ref 22–32)
Calcium: 9 mg/dL (ref 8.9–10.3)
Chloride: 105 mmol/L (ref 101–111)
Creatinine, Ser: 0.82 mg/dL (ref 0.61–1.24)
GFR calc non Af Amer: >60 mL/min (ref >60)
GLUCOSE: 194 mg/dL — AB (ref 65–99)
Potassium: 3.7 mmol/L (ref 3.5–5.1)
Sodium: 137 mmol/L (ref 135–145)

## 2018-01-25 LAB — CBC WITH DIFFERENTIAL/PLATELET
Basophils Absolute: 0 10*3/uL (ref 0.0–0.1)
Basophils Relative: 0 %
EOS PCT: 2 %
Eosinophils Absolute: 0.2 10*3/uL (ref 0.0–0.7)
HEMATOCRIT: 30.5 % — AB (ref 39.0–52.0)
Hemoglobin: 9.5 g/dL — ABNORMAL LOW (ref 13.0–17.0)
Lymphocytes Relative: 8 %
Lymphs Abs: 0.7 10*3/uL (ref 0.7–4.0)
MCH: 26.3 pg (ref 26.0–34.0)
MCHC: 31.1 g/dL (ref 30.0–36.0)
MCV: 84.5 fL (ref 78.0–100.0)
MONOS PCT: 2 %
Monocytes Absolute: 0.2 10*3/uL (ref 0.1–1.0)
NEUTROS PCT: 88 %
Neutro Abs: 8 10*3/uL — ABNORMAL HIGH (ref 1.7–7.7)
PLATELETS: 73 10*3/uL — AB (ref 150–400)
RBC: 3.61 MIL/uL — AB (ref 4.22–5.81)
RDW: 19.2 % — ABNORMAL HIGH (ref 11.5–15.5)
WBC: 9.1 10*3/uL (ref 4.0–10.5)

## 2018-01-25 LAB — T4, FREE: Free T4: 0.51 ng/dL — ABNORMAL LOW (ref 0.82–1.77)

## 2018-01-25 MED ORDER — IPRATROPIUM-ALBUTEROL 0.5-2.5 (3) MG/3ML IN SOLN: 3.0000 mL | Freq: Four times a day (QID) | RESPIRATORY_TRACT | Status: DC | PRN

## 2018-01-25 MED ORDER — METHIMAZOLE 10 MG PO TABS
10.0000 mg | ORAL_TABLET | Freq: Two times a day (BID) | ORAL | Status: DC
  Administered 2018-01-25 – 2018-01-30 (×11): 10 mg
  Filled 2018-01-25 (×12): qty 1

## 2018-01-25 MED ORDER — PANTOPRAZOLE SODIUM 40 MG PO PACK
40.0000 mg | PACK | Freq: Every day | ORAL | Status: DC
  Administered 2018-01-25 – 2018-01-30 (×6): 40 mg
  Filled 2018-01-25 (×6): qty 20

## 2018-01-25 NOTE — Progress Notes (Signed)
Patient was transported to and from CT without any complications. The patient is back in his room and resting on his ordered vent settings.

## 2018-01-25 NOTE — Progress Notes (Signed)
PULMONARY / CRITICAL CARE MEDICINE   Name: [name] MRN: [id number] DOB: 1946/09/27    ADMISSION DATE:  12/16/2017 CONSULTATION DATE:  12/30/17  REFERRING MD:  Dr. Karie Kirks   CHIEF COMPLAINT:  SOB  BRIEF SUMMARY:   72 year old man with diabetes, chronic atrial fibrillation, thyrotoxicosis.  He was admitted for dyspnea and pulmonary edema, course complicated by encephalopathy and bilatearl subdural hematoma.  He had a reassuring LP.  He required mechanical ventilation for airway protection.  He has had bouts of atrial fibrillation with RVR, hypernatremia.  The evening of 4/24 he required reintubation for overt respiratory distress in the setting of decreased mental status.  There were purulent secretions in the trachea at the time of intubation. 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  He continues to be intubated and mechanically ventilated.  He is not sedated and is not at all interactive.  He is requiring no pressors.      VITAL SIGNS: BP 126/85   Pulse 89   Temp 98.6 F (37 C) (Axillary)   Resp (!) 24   Ht 5\' 9"  (1.753 m)   Wt 246 lb 14.6 oz (112 kg)   SpO2 99%   BMI 36.46 kg/m   HEMODYNAMICS:    VENTILATOR SETTINGS: Vent Mode: PRVC FiO2 (%):  [30 %] 30 % Set Rate:  [14 bmp-18 bmp] 14 bmp Vt Set:  [570 mL] 570 mL PEEP:  [5 cmH20] 5 cmH20 Plateau Pressure:  [12 cmH20-14 cmH20] 13 cmH20  INTAKE / OUTPUT: I/O last 3 completed shifts: In: 2413 [I.V.:1343.3; NG/GT:669.7; IV Piggyback:400] Out: 1026 [Urine:1026]  PHYSICAL EXAMINATION: General: This is an elderly male who is orally intubated and mechanically ventilated.  He is in no distress.    Neuro: Exam today is on no sedation.  There continues to be no response to voice.  In response to sternal rub he does not open his eyes there is some movement in the right extremities none in the left.  Pupils are equal and EOMs are roving.  DTRs are absent    CV: S1 and S2 are irregularly irregular  without murmur rub or gallop        PULM: Respirations are unlabored, he is breathing above the set ventilator rate.  There is symmetric air movement no wheezes.      GI: The abdomen is soft and nontender the right lower quadrant ostomy is functioning    Extremities: Trace anasarca   LABS:  BMET Recent Labs  Lab 01/23/18 0430  01/24/18 0309  01/24/18 1631 01/24/18 2300 01/25/18 0441  NA 138   < > 138   < > 135 137 137  K 3.1*  --  3.4*  --   --   --  3.7  CL 101  --  106  --   --   --  105  CO2 25  --  25  --   --   --  25  BUN 20  --  18  --   --   --  23*  CREATININE 0.94  --  0.84  --   --   --  0.82  GLUCOSE 181*  --  171*  --   --   --  194*   < > = values in this interval not displayed.    Electrolytes Recent Labs  Lab 01/19/18 0515  01/23/18 0430 01/24/18 0309 01/25/18 0441  CALCIUM 10.0   < > 9.0 9.1 9.0  MG  1.8  --  1.6*  --   --   PHOS 2.7  --   --   --   --    < > = values in this interval not displayed.    CBC Recent Labs  Lab 01/23/18 0430 01/24/18 0309 01/25/18 0441  WBC 9.6 9.5 9.1  HGB 9.2* 10.0* 9.5*  HCT 29.1* 32.0* 30.5*  PLT 72* 71* 73*    Coag's No results for input(s): APTT, INR in the last 168 hours.  Sepsis Markers Recent Labs  Lab 01/20/18 0656 01/21/18 0547 01/22/18 0524  PROCALCITON <0.10 0.13 0.29    ABG Recent Labs  Lab 01/21/18 1812 01/23/18 0409 01/25/18 0506  PHART 7.617* 7.479* 7.457*  PCO2ART 27.7* 37.3 33.5  PO2ART 209.0* 139* 106.0    Liver Enzymes Recent Labs  Lab 01/21/18 0547 01/23/18 0430  AST 13* 13*  ALT 68* 43  ALKPHOS 78 65  BILITOT 1.2 1.4*  ALBUMIN 2.0* 1.8*    Cardiac Enzymes Recent Labs  Lab 01/23/18 0445 01/23/18 1105 01/23/18 1550  TROPONINI 0.19* 0.17* 0.16*    Glucose Recent Labs  Lab 01/24/18 0741 01/24/18 1150 01/24/18 1548 01/24/18 2017 01/25/18 0000 01/25/18 0438  GLUCAP 124* 143* 165* 147* 240* 167*    Imaging Ct Head Wo Contrast  Result Date:  01/25/2018 CLINICAL DATA:  Altered level of consciousness. EXAM: CT HEAD WITHOUT CONTRAST TECHNIQUE: Contiguous axial images were obtained from the base of the skull through the vertex without intravenous contrast. COMPARISON:  01/23/2018 FINDINGS: Brain: Large low-attenuation area in the distribution of the right middle cerebral artery is likely to represent acute infarct. This is unchanged in appearance since the previous study. There is new low-attenuation with effacement of gray-white junction in the left anterior frontal lobe medially consistent with an acute infarct in the distribution of the left anterior cerebral artery. No significant mass effect or midline shift. There is sulcal effacement on the right. Small chronic appearing subdural hematoma in the right posterior occipital region measuring 5 mm and without change since previous study. Diffuse cerebral atrophy. Mild ventricular dilatation consistent with central atrophy. No acute intracranial hemorrhage. Vascular: Intracranial arterial calcifications are present. Skull: Normal. Negative for fracture or focal lesion. Sinuses/Orbits: No acute finding. Other: None. IMPRESSION: 1. Large acute infarct in the distribution of the right middle cerebral artery without change since prior study. 2. New acute infarct in the distribution of the left anterior cerebral artery. 3. Small chronic appearing right occipital subdural hematoma without change. 4. No acute intracranial hemorrhage and no significant mass effect. Electronically Signed   By: Lucienne Capers M.D.   On: 01/25/2018 05:36     STUDIES:  CT Head 3/31 >> bilateral SDH's, R>L, minimal mass effect without significant midline shift MRI Brain 4/1 >> stable SDH's, trace leftward midline shift US Thyroid 4/4 >> 1.5 cm R TR 4 nodule, 1.4 cm left inferior T3 nodule EEG 4/7 >> consistent with mild generalized non-specific cerebral dysfunction.  No seizure or seizure predisposition recorded on this  study.  ECHO 4/7 >> limited study to evaluate RV, normal LV function, severe bi-atrial enlargement, unable to estimate pulmonary pressure EEG 4/11 >> sedated EEG is abnormal with moderate diffuse background slowing LP 4/15 >> clear fluid, glucose 117, protein 74, RBC 1 CT Head 4/10 >> right hemispheric SDH appears stable, no new areas of ICH, edema or mass effect  CT Head 4/23 >> no acute findings, expected decrease in known R SDH  CULTURES: BCx2 3/29 >>  coag neg staph 1/2 bottles  BCx2 4/1 >> negative  Sputum 4/9 >> Klebsiella pneumoniae >> R-ampicillin, otherwise sensitive  BCx2 4/6 >> negative  BCx2 4/9 >> coag neg staph 2/2  CSF 4/15 >> negative  CSF Fungal 4/15 >>   ANTIBIOTICS: Zosyn 3/30>> 4/5, restart 4/6 Vanc 3/31 >> 4/3  SIGNIFICANT EVENTS: 3/29  Admit  4/02  Tx to ICU for resp fx, AMS, AFwRVR, intubated 4/05  Extubated, tx out of ICU 4/06  Returned to ICU, bipap 4/15  LP 4/21  Extubated 4/22  N/V, cortrak placed 4/23  Increased secretions > NTS.  N/V overnight. TF stopped 4/24  Drowsy.   4/24  reintubated  LINES/TUBES: RUE PICC 3/30 >>  ETT 4/2 >> 4/5 ETT 4/9 >> 4/21  DISCUSSION: 72 year old with DM II, chronic atrial fibrillation (previously on anticoagulation), CKD III who was initially admitted with dyspnea on exertion.  He was treated for CHF and found to have thyrotoxicosis which was treated.  He subsequently has had intermittent seizures and depressed mental status.  Found to have bilateral R>L SDH.  MRI with concern for enhancement of the meninges > LP performed.  4/21 much more awake down sedation has been held, extubated.  Until status deteriorated on 4/24 requiring reintubation.  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   ASSESSMENT / PLAN:  PULMONARY A: He continues to be intubated for airway protection due to his decreased mental status.  There was a suspicion of aspiration at the time of intubation and he is again growing Klebsiella  pneumonia from his sputum.  He continues on Rocephin to cover that agent.  Gas exchange this morning is extremely good with a PO2 of 106 on 30%.  Unfortunately his mental status precludes extubation  CARDIOVASCULAR A:  Atrial Fibrillation - not a candidate for anticoagulation due to SDH CHF - on admit Rate is now well controlled on a combination of Lopressor and diltiazem.       RENAL No active issues  GASTROINTESTINAL A: Thrombocytopenia persists, I have switched him from Pepcid to a PPI for GI prophylaxis   HEMATOLOGIC A:   Anemia  P:  Platelet count is dropping.  He has been on no form of heparin.  He is suffering from a Klebsiella infection and I suspect that that is the provocation however if the trend continues will need to start substituting medications. SCD's for DVT prophylaxis      INFECTIOUS A:   R/O Aspiration PNA - 4/22, 4/23 with n/v P:   Vancomycin  and Zosyn were initiated following intubation on 4/24, sputum is growing Klebsiella, and antibiotic was therefore narrowed to Rocephin alone on 4/27      ENDOCRINE A:   Thyrotoxicosis  DM with Hyperglycemia    P:   SSI  His free T4 this morning is only 0.51 which is substantially low, and I have further decreased his dose of Tapazole.  NEUROLOGIC A:   His mental status remains extremely poor.  Some of this is secondary to overcorrection of his hyperthyroidism.  However he clearly has a stroke which will render him hemiplegic and his wife has told me that this would not be an acceptable state to him.  I am awaiting her arrival today to discuss appropriate level of care.  In the interim I am providing all interventions to improve his physiologic state.   her tomorrow.  Lars Masson, MD Critical Care Pgr: [phone] or if no answer [phone] 01/25/2018, 7:19 AM

## 2018-01-25 NOTE — Progress Notes (Signed)
Pt having bloody urine with sediments/clots.  [place] RN notified, no new orders.  Will continue to monitor.

## 2018-01-26 DIAGNOSIS — I63411 Cerebral infarction due to embolism of right middle cerebral artery: Secondary | ICD-10-CM

## 2018-01-26 DIAGNOSIS — Z7189 Other specified counseling: Secondary | ICD-10-CM

## 2018-01-26 DIAGNOSIS — Z9911 Dependence on respirator [ventilator] status: Secondary | ICD-10-CM

## 2018-01-26 LAB — BASIC METABOLIC PANEL
Anion gap: 4 — ABNORMAL LOW (ref 5–15)
BUN: 25 mg/dL — AB (ref 6–20)
CHLORIDE: 109 mmol/L (ref 101–111)
CO2: 25 mmol/L (ref 22–32)
CREATININE: 0.69 mg/dL (ref 0.61–1.24)
Calcium: 8.5 mg/dL — ABNORMAL LOW (ref 8.9–10.3)
GFR calc Af Amer: >60 mL/min (ref >60)
GFR calc non Af Amer: >60 mL/min (ref >60)
Glucose, Bld: 246 mg/dL — ABNORMAL HIGH (ref 65–99)
Potassium: 3.4 mmol/L — ABNORMAL LOW (ref 3.5–5.1)
Sodium: 138 mmol/L (ref 135–145)

## 2018-01-26 LAB — GLUCOSE, CAPILLARY
GLUCOSE-CAPILLARY: 221 mg/dL — AB (ref 65–99)
GLUCOSE-CAPILLARY: 228 mg/dL — AB (ref 65–99)
GLUCOSE-CAPILLARY: 173 mg/dL — AB (ref 65–99)
Glucose-Capillary: 197 mg/dL — ABNORMAL HIGH (ref 65–99)
Glucose-Capillary: 250 mg/dL — ABNORMAL HIGH (ref 65–99)
Glucose-Capillary: 252 mg/dL — ABNORMAL HIGH (ref 65–99)

## 2018-01-26 NOTE — Evaluation (Signed)
Physical Therapy Re-Evaluation Patient Details Name: [name] MRN: [id number] DOB: 06-10-1946 Today's Date: 01/26/2018   History of Present Illness  71 y.o. male with a history of a fib, stage 3 chron c kidney disease, DM2, adenocarcinoma of colon s/p resection and current colostomy, history of renal cell carcinoma s/p resection.  Pt admitted on 11/28/2017, initially found to have worsening shortness of breath followed by confusion and agitation, was intubated on 4/2 then extubated 4/5, reintubated 4/10 and extubated 4/21, then reintubated 4/24. MRI initially stable right greater than left subdural hematomas since the head CT The right side subdural demonstrates greater lobulation and signal heterogeneity measuring between 8 and 17 mm in thickness. The Stable mild intracranial mass effect with trace leftward midline shift. Normal basilar cisterns.  Thgen on 4/25 MRI showed large R MCA CVA.   Clinical Impression  Patient presents after re-intubation and new MCA CVA with L hemiparesis with continued need for total A for EOB activities.  Currently lethargic and on vent with ~20% ability to follow commands this session.  Continue to feel he will need SNF level rehab at d/c pending family plans/goals of care.   PT will continue to follow acutely as tolerated to progress mobility, sitting balance & tolerance to OOB.     Follow Up Recommendations SNF;Supervision/Assistance - 24 hour    Equipment Recommendations  Other (comment)(TBA)    Recommendations for Other Services       Precautions / Restrictions Precautions Precautions: Fall Precaution Comments: intubated      Mobility  Bed Mobility Overal bed mobility: Needs Assistance Bed Mobility: Supine to Sit;Sit to Supine     Supine to sit: +2 for safety/equipment;Total assist Sit to supine: +2 for safety/equipment;Total assist   General bed mobility comments: assist for legs off bed and heavy lift for trunk, sit to supine assist for legs  and trunk and to scoot to [facility]  Transfers                    Ambulation/Gait                Stairs            Wheelchair Mobility    Modified Rankin (Stroke Patients Only) Modified Rankin (Stroke Patients Only) Pre-Morbid Rankin Score: No symptoms Modified Rankin: Severe disability     Balance Overall balance assessment: Needs assistance   Sitting balance-Leahy Scale: Zero Sitting balance - Comments: pushes to L in sitting, head flexed and not opening eyes, sat about 6-8 minutes with total A                                      Pertinent Vitals/Pain Faces Pain Scale: No hurt    Home Living Family/patient expects to be discharged to:: Skilled nursing facility Living Arrangements: Spouse/significant other(wife) Available Help at Discharge: Family Type of Home: House Home Access: Stairs to enter     Home Layout: One level        Prior Function                 Hand Dominance        Extremity/Trunk Assessment   Upper Extremity Assessment Upper Extremity Assessment: Defer to OT evaluation    Lower Extremity Assessment Lower Extremity Assessment: RLE deficits/detail;LLE deficits/detail RLE Deficits / Details: PROM WFL, with some reflexive full body flexion with passive motion, no noted clonus  LLE Deficits / Details: no movement seen with PROM    Cervical / Trunk Assessment Cervical / Trunk Assessment: Other exceptions Cervical / Trunk Exceptions: no attempt at head righting in sitting, note L rotation (vent on L) in supine and grimacing when passively moved closer to midline  Communication   Communication: Other (comment)(intubated)  Cognition Arousal/Alertness: Lethargic Behavior During Therapy: Flat affect Overall Cognitive Status: Difficult to assess                                 General Comments: Did follow command to squeeze hand on R and performed thumbs up and thumbs down to command, did nod  his head once sitting in response to "are you still with me"      General Comments General comments (skin integrity, edema, etc.): wife in the room during session    Exercises     Assessment/Plan    PT Assessment Patient needs continued PT services  PT Problem List Decreased strength;Decreased activity tolerance;Decreased balance;Decreased mobility;Decreased coordination;Decreased cognition;Impaired sensation;Impaired tone;Obesity       PT Treatment Interventions DME instruction;Therapeutic activities;Patient/family education;Therapeutic exercise;Balance training;Functional mobility training;Neuromuscular re-education    PT Goals (Current goals can be found in the Care Plan section)  Acute Rehab PT Goals Patient Stated Goal: Unstated PT Goal Formulation: With family Time For Goal Achievement: 02/09/18 Potential to Achieve Goals: Fair    Frequency Min 3X/week   Barriers to discharge        Co-evaluation PT/OT/SLP Co-Evaluation/Treatment: Yes Reason for Co-Treatment: Complexity of the patient's impairments (multi-system involvement);For patient/therapist safety PT goals addressed during session: Mobility/safety with mobility;Balance         AM-PAC PT "6 Clicks" Daily Activity  Outcome Measure Difficulty turning over in bed (including adjusting bedclothes, sheets and blankets)?: Unable Difficulty moving from lying on back to sitting on the side of the bed? : Unable Difficulty sitting down on and standing up from a chair with arms (e.g., wheelchair, bedside commode, etc,.)?: Unable Help needed moving to and from a bed to chair (including a wheelchair)?: Total Help needed walking in hospital room?: Total Help needed climbing 3-5 steps with a railing? : Total 6 Click Score: 6    End of Session   Activity Tolerance: Patient limited by lethargy Patient left: with call bell/phone within reach;in bed;with family/visitor present;with nursing/sitter in room   PT Visit  Diagnosis: Other symptoms and signs involving the nervous system (R29.898);Hemiplegia and hemiparesis Hemiplegia - Right/Left: Left Hemiplegia - dominant/non-dominant: Non-dominant Hemiplegia - caused by: Cerebral infarction    Time: 1610-9604 PT Time Calculation (min) (ACUTE ONLY): 25 min   Charges:   PT Evaluation $PT Re-evaluation: 1 Re-eval     PT G CodesMagda Kiel, [place] [phone] 01/26/2018   Reginia Naas 01/26/2018, 10:50 AM

## 2018-01-26 NOTE — Progress Notes (Signed)
Daily Progress Note   Patient Name: [name]       Date: 01/26/2018 DOB: 1945-10-02  Age: 72 y.o. MRN#: [id number] Attending Physician: Marshell Garfinkel, MD Primary Care Physician: Lemmie Evens, MD Admit Date: 12/01/2017  Reason for Consultation/Follow-up: Establishing goals of care and Withdrawal of life-sustaining treatment  Subjective: [name] resting comfortably on vent - weaning.   Length of Stay: 31  Current Medications: Scheduled Meds:  . aspirin  81 mg Oral Daily  . chlorhexidine  15 mL Mouth Rinse BID  . chlorhexidine gluconate (MEDLINE KIT)  15 mL Mouth Rinse BID  . Chlorhexidine Gluconate Cloth  6 each Topical Daily  . diltiazem  60 mg Oral Q8H  . feeding supplement (PRO-STAT SUGAR FREE 64)  30 mL Per Tube BID  . insulin aspart  0-15 Units Subcutaneous Q4H  . insulin aspart  3 Units Subcutaneous Q4H  . levETIRAcetam  750 mg Per Tube BID  . mouth rinse  15 mL Mouth Rinse 10 times per day  . methimazole  10 mg Per Tube BID  . metoprolol tartrate  25 mg Oral Q6H  . multivitamin  15 mL Per Tube Daily  . pantoprazole sodium  40 mg Per Tube Daily  . sodium chloride flush  10-40 mL Intracatheter Q12H    Continuous Infusions: . 0.9 % NaCl with KCl 20 mEq / L 50 mL/hr at 01/26/18 0700  . cefTRIAXone (ROCEPHIN)  IV 1 g (01/26/18 1100)  . feeding supplement (VITAL HIGH PROTEIN) 1,000 mL (01/26/18 1058)  . propofol (DIPRIVAN) infusion Stopped (01/22/18 0836)    PRN Meds: acetaminophen (TYLENOL) oral liquid 160 mg/5 mL **OR** acetaminophen, hydrALAZINE, ipratropium-albuterol, LORazepam, metoprolol tartrate, ondansetron **OR** ondansetron (ZOFRAN) IV, sodium chloride flush  Physical Exam  Constitutional: He appears well-developed. He appears lethargic. He is  intubated.  HENT:  Head: Normocephalic and atraumatic.  Cardiovascular: Tachycardia present.  Pulmonary/Chest: No tachypnea. He is intubated. No respiratory distress.  Abdominal: Normal appearance.  Neurological: He appears lethargic. He is disoriented.  Nursing note and vitals reviewed.           Vital Signs: BP 138/67   Pulse (!) 113   Temp 98 F (36.7 C) (Axillary)   Resp 20   Ht _0  (1.753 m)   Wt 117 kg (257 lb 15  oz)   SpO2 100%   BMI 38.09 kg/m  SpO2: SpO2: 100 % O2 Device: O2 Device: Ventilator O2 Flow Rate: O2 Flow Rate (L/min): 4 L/min  Intake/output summary:   Intake/Output Summary (Last 24 hours) at 01/26/2018 1120 Last data filed at 01/26/2018 0800 Gross per 24 hour  Intake 2740 ml  Output 1100 ml  Net 1640 ml   LBM: Last BM Date: 01/24/18 Baseline Weight: Weight: 129.7 kg (286 lb) Most recent weight: Weight: 117 kg (257 lb 15 oz)       Palliative Assessment/Data:    Flowsheet Rows     Most Recent Value  Intake Tab  Referral Department  Hospitalist  Unit at Time of Referral  ICU  Palliative Care Primary Diagnosis  Pulmonary  Date Notified  01/03/18  Palliative Care Type  New Palliative care  Reason for referral  Clarify Goals of Care, Psychosocial or Spiritual support  Date of Admission  12/16/2017  Date first seen by Palliative Care  01/04/18  # of days Palliative referral response time  1 Day(s)  # of days IP prior to Palliative referral  8  Clinical Assessment  Palliative Performance Scale Score  30%  Pain Max last 24 hours  Not able to report  Pain Min Last 24 hours  Not able to report  Dyspnea Max Last 24 Hours  Not able to report  Dyspnea Min Last 24 hours  Not able to report  Nausea Max Last 24 Hours  Not able to report  Nausea Min Last 24 Hours  Not able to report  Anxiety Max Last 24 Hours  Not able to report  Anxiety Min Last 24 Hours  Not able to report  Other Max Last 24 Hours  Not able to report  Psychosocial & Spiritual  Assessment  Palliative Care Outcomes  Patient/Family meeting held?  Yes  Who was at the meeting?  wife  Palliative Care follow-up planned  Yes, Facility      Patient Active Problem List   Diagnosis Date Noted  . Cerebral embolism with cerebral infarction 01/22/2018  . Acute encephalopathy   . Palliative care encounter   . Palliative care by specialist   . Hypertensive emergency   . Acute congestive heart failure ([place])   . Acute respiratory failure with hypoxia ([place])   . Acute diastolic (congestive) heart failure ([place])   . Subdural hemorrhage ([name])   . Acute metabolic encephalopathy   . Thyrotoxicosis with thyrotoxic crisis   . Atrial fibrillation with RVR ([place]) 12/03/2017  . History of kidney cancer 12/17/2017  . History of colon cancer 12/17/2017  . Stage 3 chronic kidney disease ([place]) 12/13/2017  . Acquired solitary kidney 12/17/2017  . Supratherapeutic INR 12/13/2017  . Heme positive stool 12/15/2017  . Acute systolic CHF (congestive heart failure) ([place]) 12/07/2017  . Elevated troponin 12/12/2017  . Hypertension   . Diabetes mellitus without complication ([place])   . Atrial fibrillation ([place])   . Chronic anticoagulation     Palliative Care Assessment & Plan   HPI: 72 y.o. male  with past medical history of diabetes type 2, chronic kidney disease stage III history of colon cancer with colostomy in 2010, renal cell carcinoma with right nephrectomy 2010 admitted on 11/29/2017 with weakness, shortness of breath, abnormal INR.  He was initially seen at [facility] and transferred to [facility] for higher level of care. Now on his 3rd intubation this hospitalization and MRI head shows large Rt MCA infarct likely s/t  clot from atrial fibrillation. Prognosis is now poor.   Assessment: I met today with wife [name] at [name] bedside. We stepped out to speak privately. RN says that [name] does at times follow some simple commands but has no movement of left side and is  very inconsistent with commands.   I spoke more with [name] who shares more about her husband of 29 yrs and that he enjoys figuring out the mechanics of machines and fixing things and has many projects. He also has a motorcycle that he enjoys riding and a dog, Sam. She says that he is a very active and social person and clearly knows that we will not get him back to that acceptable QOL for him. She is very much, and understandably, struggling with the next step.   After much discussion regarding extubation, comfort care, and even hospice we decided to agree with DNR and no escalation to aggressive measures such as surgery/interventions. She will call and speak with Mr. Murnane 2 daughters (from previous marriage) and brother and offer them to visit and be here for support for her and hopefully plan a conference call for them and myself tomorrow. She does understand that next step is likely extubation that will probably require transition to full comfort care given the pattern and cycle during this admission. Therapeutic listening and emotional support provided.   Recommendations/Plan:  DNR, no escalation to aggressive measures  Working on getting family here for more support for [name] with likely plan for one way extubation  [name] trying to coordinate conference call for myself and other family members  Goals of Care and Additional Recommendations:  Limitations on Scope of Treatment: No Hemodialysis, No Surgical Procedures and No Tracheostomy  Code Status:  DNR  Prognosis:   Prognosis very poor. Likely days with one way extubation.   Discharge Planning:  Likely hospital death vs hospice facility.    Thank you for allowing the Palliative Medicine Team to assist in the care of this patient.   Time In: 1000 Time Out: 1135 Total Time 95 min Prolonged Time Billed  yes       Greater than 50%  of this time was spent counseling and coordinating care related to the above assessment and  plan.  Vinie Sill, NP Palliative Medicine Team Pager # [phone] (M-F 8a-5p) Team Phone # [phone] (Nights/Weekends)

## 2018-01-26 NOTE — Progress Notes (Signed)
Inpatient Diabetes Program Recommendations  AACE/ADA: New Consensus Statement on Inpatient Glycemic Control (2015)  Target Ranges:  Prepandial:   less than 140 mg/dL      Peak postprandial:   less than 180 mg/dL (1-2 hours)      Critically ill patients:  140 - 180 mg/dL   Results for [name], [name] (MRN [id number]) as of 01/26/2018 10:11  Ref. Range 01/25/2018 08:57 01/25/2018 12:17 01/25/2018 15:13 01/25/2018 19:24 01/25/2018 23:07 01/26/2018 03:22 01/26/2018 07:54  Glucose-Capillary Latest Ref Range: 65 - 99 mg/dL 188 (H) 251 (H) 242 (H) 196 (H) 233 (H) 221 (H) 252 (H)   Review of Glycemic Control  Diabetes history: DM2 Outpatient Diabetes medications: Metformin 1000 mg BID, Actos 45 mg daily Current orders for Inpatient glycemic control: Novolog 0-15 units Q4H, Novolog 3 units Q4H; Vital @ 55 ml/hr  Inpatient Diabetes Program Recommendations:  Insulin -Tube Feeding Coverage: Please consider increasing tube feeding coverage to Novolog 6 units Q4H.  Thanks, Barnie Alderman, RN, MSN, CDE Diabetes Coordinator Inpatient Diabetes Program [phone] (Team Pager from 8am to 5pm)

## 2018-01-26 NOTE — Progress Notes (Signed)
PULMONARY / CRITICAL CARE MEDICINE   Name: [name] MRN: [id number] DOB: 16-Feb-1946    ADMISSION DATE:  12/19/2017 CONSULTATION DATE:  12/30/17  REFERRING MD:  Dr. Karie Kirks   CHIEF COMPLAINT:  SOB  BRIEF SUMMARY:   72 year old man with diabetes, chronic atrial fibrillation, thyrotoxicosis.  He was admitted for dyspnea and pulmonary edema, course complicated by encephalopathy and bilatearl subdural hematoma.  He had a reassuring LP.  He required mechanical ventilation for airway protection.  He has had bouts of atrial fibrillation with RVR, hypernatremia.  The evening of 4/24 he required reintubation for overt respiratory distress in the setting of decreased mental status.  There were purulent secretions in the trachea at the time of intubation. 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  He continues to be intubated and mechanically ventilated, is tolerating CPAP this morning.  He is receiving no sedation and is not at all interactive for me.  He is on no pressors.      VITAL SIGNS: BP 135/80 (BP Location: Left Arm)   Pulse 100   Temp 98 F (36.7 C) (Axillary)   Resp (!) 23   Ht 5\' 9"  (1.753 m)   Wt 257 lb 15 oz (117 kg)   SpO2 98%   BMI 38.09 kg/m   HEMODYNAMICS:    VENTILATOR SETTINGS: Vent Mode: PRVC FiO2 (%):  [30 %] 30 % Set Rate:  [14 bmp] 14 bmp Vt Set:  [570 mL] 570 mL PEEP:  [5 cmH20] 5 cmH20 Plateau Pressure:  [12 cmH20] 12 cmH20  INTAKE / OUTPUT: I/O last 3 completed shifts: In: 4656 [I.V.:2070; NG/GT:2135; IV Piggyback:250] Out: 8127 [Urine:1000; Stool:350]  PHYSICAL EXAMINATION: General: Elderly male who is orally intubated and mechanically ventilated.  He is in no distress on CPAP.     Neuro: Exam is again performed on no sedation.  There is no response to voice.  There is some random movement of the right upper extremity in response to sternal rub.  He does not eye open.  Pupils are equal. DTRs are absent    CV: S1 and S2 are  irregularly irregular without murmur rub or gallop        PULM: Respirations are unlabored, there is symmetric air movement, no wheezes, very few rhonchi.  s.      GI: Abdomen is obese and soft without any organomegaly masses tenderness guarding or rebound.  There is a functioning right lower quadrant ostomy.      Extremities: Trace anasarca   LABS:  BMET Recent Labs  Lab 01/24/18 0309  01/24/18 2300 01/25/18 0441 01/26/18 0440  NA 138   < > 137 137 138  K 3.4*  --   --  3.7 3.4*  CL 106  --   --  105 109  CO2 25  --   --  25 25  BUN 18  --   --  23* 25*  CREATININE 0.84  --   --  0.82 0.69  GLUCOSE 171*  --   --  194* 246*   < > = values in this interval not displayed.    Electrolytes Recent Labs  Lab 01/23/18 0430 01/24/18 0309 01/25/18 0441 01/26/18 0440  CALCIUM 9.0 9.1 9.0 8.5*  MG 1.6*  --   --   --     CBC Recent Labs  Lab 01/23/18 0430 01/24/18 0309 01/25/18 0441  WBC 9.6 9.5 9.1  HGB 9.2* 10.0* 9.5*  HCT 29.1* 32.0* 30.5*  PLT 72* 71* 73*    Coag's No results for input(s): APTT, INR in the last 168 hours.  Sepsis Markers Recent Labs  Lab 01/20/18 0656 01/21/18 0547 01/22/18 0524  PROCALCITON <0.10 0.13 0.29    ABG Recent Labs  Lab 01/21/18 1812 01/23/18 0409 01/25/18 0506  PHART 7.617* 7.479* 7.457*  PCO2ART 27.7* 37.3 33.5  PO2ART 209.0* 139* 106.0    Liver Enzymes Recent Labs  Lab 01/21/18 0547 01/23/18 0430  AST 13* 13*  ALT 68* 43  ALKPHOS 78 65  BILITOT 1.2 1.4*  ALBUMIN 2.0* 1.8*    Cardiac Enzymes Recent Labs  Lab 01/23/18 0445 01/23/18 1105 01/23/18 1550  TROPONINI 0.19* 0.17* 0.16*    Glucose Recent Labs  Lab 01/25/18 1217 01/25/18 1513 01/25/18 1924 01/25/18 2307 01/26/18 0322 01/26/18 0754  GLUCAP 251* 242* 196* 233* 221* 252*    Imaging No results found.   STUDIES:  CT Head 3/31 >> bilateral SDH's, R>L, minimal mass effect without significant midline shift MRI Brain 4/1 >> stable SDH's,  trace leftward midline shift US Thyroid 4/4 >> 1.5 cm R TR 4 nodule, 1.4 cm left inferior T3 nodule EEG 4/7 >> consistent with mild generalized non-specific cerebral dysfunction.  No seizure or seizure predisposition recorded on this study.  ECHO 4/7 >> limited study to evaluate RV, normal LV function, severe bi-atrial enlargement, unable to estimate pulmonary pressure EEG 4/11 >> sedated EEG is abnormal with moderate diffuse background slowing LP 4/15 >> clear fluid, glucose 117, protein 74, RBC 1 CT Head 4/10 >> right hemispheric SDH appears stable, no new areas of ICH, edema or mass effect  CT Head 4/23 >> no acute findings, expected decrease in known R SDH  CULTURES: BCx2 3/29 >> coag neg staph 1/2 bottles  BCx2 4/1 >> negative  Sputum 4/9 >> Klebsiella pneumoniae >> R-ampicillin, otherwise sensitive  BCx2 4/6 >> negative  BCx2 4/9 >> coag neg staph 2/2  CSF 4/15 >> negative  CSF Fungal 4/15 >>   ANTIBIOTICS: Zosyn 3/30>> 4/5, restart 4/6 Vanc 3/31 >> 4/3  SIGNIFICANT EVENTS: 3/29  Admit  4/02  Tx to ICU for resp fx, AMS, AFwRVR, intubated 4/05  Extubated, tx out of ICU 4/06  Returned to ICU, bipap 4/15  LP 4/21  Extubated 4/22  N/V, cortrak placed 4/23  Increased secretions > NTS.  N/V overnight. TF stopped 4/24  Drowsy.   4/24  reintubated  LINES/TUBES: RUE PICC 3/30 >>  ETT 4/2 >> 4/5 ETT 4/9 >> 4/21  DISCUSSION: 72 year old with DM II, chronic atrial fibrillation (previously on anticoagulation), CKD III who was initially admitted with dyspnea on exertion.  He was treated for CHF and found to have thyrotoxicosis which was treated.  He subsequently has had intermittent seizures and depressed mental status.  Found to have bilateral R>L SDH.  MRI with concern for enhancement of the meninges > LP performed.  4/21 much more awake down sedation has been held, extubated.  Until status deteriorated on 4/24 requiring reintubation.  An MRI showed a new right MCA territory  infarct.   ASSESSMENT / PLAN:  PULMONARY A: He is intubated at this point solely for airway protection.  I am continuing him on Rocephin for aspiration with Klebsiella growing in the sputum.    CARDIOVASCULAR A:  Atrial Fibrillation - not a candidate for anticoagulation due to SDH CHF - on admit He continues to have good rate control on a combination of Lopressor and diltiazem  RENAL No active issues  GASTROINTESTINAL A: Thrombocytopenia persists, I have switched him from Pepcid to a PPI for GI prophylaxis. MATOLOGIC A:   Anemia  P:  Platelet count is dropping.  He has been on no form of heparin.  He is suffering from a Klebsiella infection and I suspect that that is the provocation however if the trend continues will need to start substituting medications.  I will be rechecking a CBC on 4/30 SCD's for DVT prophylaxis      INFECTIOUS A:   R/O Aspiration PNA - 4/22, 4/23 with n/v P:   Vancomycin  and Zosyn were initiated following intubation on 4/24, sputum is growing Klebsiella, and antibiotic was therefore narrowed to Rocephin alone on 4/27      ENDOCRINE A:   Thyrotoxicosis  DM with Hyperglycemia    P:   SSI  His free T4 this morning is only 0.51 which is substantially low, and I have further decreased his dose of Tapazole.  NEUROLOGIC A:   His mental status remains extremely poor.  He has both hypothyroidism and a new right MCA territory CVA accounting for his decreased mental status.  I discussed the situation again with his wife this morning.  She is aware that even if we fix his metabolic issues he is going to have a hemiplegia and she has previously expressed that this would not be acceptable to Mr. Prettyman.  She is having a difficult time however deciding to withdraw care even though she feels that this would be his wishes.  She has asked to speak with palliative care  Lars Masson, MD Critical Care Pgr: [phone] or if no answer [phone] 01/26/2018, 9:39  AM

## 2018-01-26 NOTE — Progress Notes (Signed)
Placed patient on full ventilator support to assist with resting over night.

## 2018-01-26 NOTE — Evaluation (Signed)
Occupational Therapy Evaluation Patient Details Name: [name] MRN: [id number] DOB: October 13, 1945 Today's Date: 01/26/2018    History of Present Illness 72 y.o. male with a history of a fib, stage 3 CKD, DM2, adenocarcinoma of colon s/p resection and current colostomy, history of renal cell carcinoma s/p resection.  Pt admitted on 12/20/2017, initially found to have worsening shortness of breath followed by confusion and agitation, was intubated on 4/2 then extubated 4/5, reintubated 4/10 and extubated 4/21, then reintubated 4/24. MRI initially stable right greater than left subdural hematomas since the head CT The right side subdural demonstrates greater lobulation and signal heterogeneity measuring between 8 and 17 mm in thickness. The Stable mild intracranial mass effect with trace leftward midline shift. Normal basilar cisterns.  Thgen on 4/25 MRI showed large R MCA CVA.    Clinical Impression   This 72 yo male admitted with above and then with multiple medical complications since admitted thus affecting his safety and independence with basic ADLs. He will continue to benefit from acute OT with follow up at SNF.   Follow Up Recommendations  SNF;Supervision/Assistance - 24 hour    Equipment Recommendations  Other (comment)(TBD at next venue)       Precautions / Restrictions Precautions Precautions: Fall Precaution Comments: intubated      Mobility Bed Mobility Overal bed mobility: Needs Assistance Bed Mobility: Supine to Sit;Sit to Supine     Supine to sit: +2 for safety/equipment;Total assist Sit to supine: +2 for safety/equipment;Total assist   General bed mobility comments: assist for legs off bed and heavy lift for trunk, sit to supine assist for legs and trunk and to scoot to [facility]     Balance Overall balance assessment: Needs assistance   Sitting balance-Leahy Scale: Zero Sitting balance - Comments: pushes to L in sitting, head flexed and not opening eyes, sat about 6-8  minutes with total A                                    ADL either performed or assessed with clinical judgement   ADL Overall ADL's : Needs assistance/impaired                                       General ADL Comments: total A for all basic ADLs     Vision   Additional Comments: Pt with eyes closed 99% of time, when they were open they were deviated to left (pt also with head turn to left)            Pertinent Vitals/Pain Pain Assessment: Faces Faces Pain Scale: No hurt     Hand Dominance  right   Extremity/Trunk Assessment Upper Extremity Assessment Upper Extremity Assessment: RUE deficits/detail;LUE deficits/detail RUE Deficits / Details: Pt able to grasp my hand and release, not moving arm more than this (wife did report that he put his RUE up on bedrail) LUE Deficits / Details: NO AROM noted, 1 1/2 finger sublux   Lower Extremity Assessment Lower Extremity Assessment: RLE deficits/detail;LLE deficits/detail RLE Deficits / Details: PROM WFL, with some reflexive full body flexion with passive motion, no noted clonus LLE Deficits / Details: no movement seen with PROM   Cervical / Trunk Assessment Cervical / Trunk Assessment: Other exceptions Cervical / Trunk Exceptions: no attempt at head righting in sitting, note L  rotation (vent on L) in supine and grimacing when passively moved closer to midline   Communication Communication Communication: Other (comment)(intubated)   Cognition Arousal/Alertness: Lethargic Behavior During Therapy: Flat affect Overall Cognitive Status: Difficult to assess                                 General Comments: Did follow command to squeeze and release hand on R and performed thumbs up and thumbs down to command, did nod his head once sitting in response to "are you still with me"   General Comments  wife in the room during session            [place] expects to be  discharged to:: Skilled nursing facility Living Arrangements: Spouse/significant other(wife) Available Help at Discharge: Family Type of Home: House Home Access: Stairs to enter     Gay: One level     Bathroom Shower/Tub: Tub/shower unit;Walk-in shower   Bathroom Toilet: Standard                         OT Problem List: Decreased strength;Decreased range of motion;Decreased activity tolerance;Impaired balance (sitting and/or standing);Impaired vision/perception;Decreased coordination;Decreased cognition;Decreased safety awareness;Decreased knowledge of use of DME or AE;Impaired sensation;Impaired UE functional use;Obesity;Impaired tone      OT Treatment/Interventions: Self-care/ADL training;Therapeutic exercise;Therapeutic activities;Patient/family education;Balance training    OT Goals(Current goals can be found in the care plan section) Acute Rehab OT Goals Patient Stated Goal: pateint unable to state OT Goal Formulation: Patient unable to participate in goal setting Time For Goal Achievement: 02/09/18 Potential to Achieve Goals: Fair  OT Frequency: Min 2X/week           Co-evaluation PT/OT/SLP Co-Evaluation/Treatment: Yes Reason for Co-Treatment: Complexity of the patient's impairments (multi-system involvement);For patient/therapist safety PT goals addressed during session: Mobility/safety with mobility;Balance OT goals addressed during session: Strengthening/ROM      AM-PAC PT "6 Clicks" Daily Activity     Outcome Measure Help from another person eating meals?: Total Help from another person taking care of personal grooming?: Total Help from another person toileting, which includes using toliet, bedpan, or urinal?: Total Help from another person bathing (including washing, rinsing, drying)?: Total Help from another person to put on and taking off regular upper body clothing?: Total Help from another person to put on and taking off regular lower body  clothing?: Total 6 Click Score: 6   End of Session    Activity Tolerance: Patient limited by lethargy Patient left: in bed;with bed alarm set  OT Visit Diagnosis: Other abnormalities of gait and mobility (R26.89);Muscle weakness (generalized) (M62.81);Low vision, both eyes (H54.2);Other symptoms and signs involving the nervous system (R29.898);Other symptoms and signs involving cognitive function;Cognitive communication deficit (R41.841);Hemiplegia and hemiparesis Symptoms and signs involving cognitive functions: Cerebral infarction;Nontraumatic intracerebral hemorrhage Hemiplegia - Right/Left: Right Hemiplegia - dominant/non-dominant: Dominant Hemiplegia - caused by: Nontraumatic intracerebral hemorrhage;Cerebral infarction                Time: 0263-7858 OT Time Calculation (min): 23 min Charges:  OT General Charges $OT Visit: 1 Visit OT Evaluation $OT Re-eval: 1 Re-eval [place], OTR/L [phone] 01/26/2018

## 2018-01-26 NOTE — Consult Note (Signed)
[place] Nurse wound consult note Reason for Consult: left ear? DTI Wound type: Deep tissue pressure injury Pressure Injury POA:No Wound OEC:XFQH purple, non blanchable, intact skin  Drainage (amount, consistency, odor) none Periwound: intact  Dressing procedure/placement/frequency: Cover with silicone, protect from further injury Will request that respiratory therapy additionally monitor with any placement of respiratory devices   WOC Nurse team will follow along with you for weekly wound assessments.  Please notify me of any acute changes in the wounds or any new areas of concerns [place] MSN, RN,CWOCN, [place], [place]

## 2018-01-27 LAB — CBC WITH DIFFERENTIAL/PLATELET
BASOS PCT: 0 %
Basophils Absolute: 0 10*3/uL (ref 0.0–0.1)
Eosinophils Absolute: 0.2 10*3/uL (ref 0.0–0.7)
Eosinophils Relative: 3 %
HEMATOCRIT: 29.5 % — AB (ref 39.0–52.0)
Hemoglobin: 9.2 g/dL — ABNORMAL LOW (ref 13.0–17.0)
LYMPHS ABS: 1.2 10*3/uL (ref 0.7–4.0)
Lymphocytes Relative: 18 %
MCH: 26.6 pg (ref 26.0–34.0)
MCHC: 31.2 g/dL (ref 30.0–36.0)
MCV: 85.3 fL (ref 78.0–100.0)
MONO ABS: 0.1 10*3/uL (ref 0.1–1.0)
MONOS PCT: 2 %
NEUTROS ABS: 5.3 10*3/uL (ref 1.7–7.7)
Neutrophils Relative %: 77 %
Platelets: 73 10*3/uL — ABNORMAL LOW (ref 150–400)
RBC: 3.46 MIL/uL — ABNORMAL LOW (ref 4.22–5.81)
RDW: 19.3 % — AB (ref 11.5–15.5)
WBC: 6.8 10*3/uL (ref 4.0–10.5)

## 2018-01-27 LAB — BASIC METABOLIC PANEL
ANION GAP: 7 (ref 5–15)
BUN: 24 mg/dL — ABNORMAL HIGH (ref 6–20)
CALCIUM: 8.8 mg/dL — AB (ref 8.9–10.3)
CHLORIDE: 109 mmol/L (ref 101–111)
CO2: 25 mmol/L (ref 22–32)
Creatinine, Ser: 0.67 mg/dL (ref 0.61–1.24)
GFR calc Af Amer: >60 mL/min (ref >60)
GFR calc non Af Amer: >60 mL/min (ref >60)
GLUCOSE: 226 mg/dL — AB (ref 65–99)
Potassium: 3.6 mmol/L (ref 3.5–5.1)
Sodium: 141 mmol/L (ref 135–145)

## 2018-01-27 LAB — GLUCOSE, CAPILLARY
GLUCOSE-CAPILLARY: 249 mg/dL — AB (ref 65–99)
Glucose-Capillary: 217 mg/dL — ABNORMAL HIGH (ref 65–99)
Glucose-Capillary: 183 mg/dL — ABNORMAL HIGH (ref 65–99)
Glucose-Capillary: 236 mg/dL — ABNORMAL HIGH (ref 65–99)
Glucose-Capillary: 205 mg/dL — ABNORMAL HIGH (ref 65–99)

## 2018-01-27 NOTE — Progress Notes (Signed)
Palliative:  [name] continues to be on vent. He does attempt to open eyes to command and does squeeze my hand with his right hand to command. I know these interactions are not consistent. However, I do believe these interactions are making decisions much more difficult for wife, [name]. [name] not at bedside this morning. I did leave her a voicemail.   Late entry: I returned to bedside and [name] along with a couple visitors are at bedside. [name] tells me she is speaking with family and will have a decision for [place] by Thursday. I offered emotional support and encouraged her to call me with anything I can help with. Emotional support provided.   15 min  Vinie Sill, NP Palliative Medicine Team Pager # [phone] (M-F 8a-5p) Team Phone # [phone] (Nights/Weekends)

## 2018-01-27 NOTE — Progress Notes (Signed)
Inpatient Diabetes Program Recommendations  AACE/ADA: New Consensus Statement on Inpatient Glycemic Control (2019)  Target Ranges:  Prepandial:   less than 140 mg/dL      Peak postprandial:   less than 180 mg/dL (1-2 hours)      Critically ill patients:  140 - 180 mg/dL  Results for DA, AUTHEMENT (MRN [id number]) as of 01/27/2018 12:27  Ref. Range 01/26/2018 11:10 01/26/2018 15:19 01/26/2018 20:15 01/26/2018 23:51 01/27/2018 04:02 01/27/2018 09:02 01/27/2018 11:57  Glucose-Capillary Latest Ref Range: 65 - 99 mg/dL 250 (H) 228 (H) 173 (H) 197 (H) 217 (H) 183 (H) 249 (H)   Results for GAHEL, SAFLEY (MRN [id number]) as of 01/26/2018 10:11  Ref. Range 01/25/2018 08:57 01/25/2018 12:17 01/25/2018 15:13 01/25/2018 19:24 01/25/2018 23:07 01/26/2018 03:22 01/26/2018 07:54  Glucose-Capillary Latest Ref Range: 65 - 99 mg/dL 188 (H) 251 (H) 242 (H) 196 (H) 233 (H) 221 (H) 252 (H)   Review of Glycemic Control  Diabetes history: DM2 Outpatient Diabetes medications: Metformin 1000 mg BID, Actos 45 mg daily Current orders for Inpatient glycemic control: Novolog 0-15 units Q4H, Novolog 3 units Q4H; Vital @ 55 ml/hr  Inpatient Diabetes Program Recommendations:  Insulin -Tube Feeding Coverage: Please consider increasing tube feeding coverage to Novolog 6 units Q4H.  Thanks, Barnie Alderman, RN, MSN, CDE Diabetes Coordinator Inpatient Diabetes Program [phone] (Team Pager from 8am to 5pm)

## 2018-01-27 NOTE — Progress Notes (Signed)
PULMONARY / CRITICAL CARE MEDICINE   Name: [name] MRN: [id number] DOB: 1946/09/01    ADMISSION DATE:  12/23/2017 CONSULTATION DATE:  12/30/17  REFERRING MD:  Dr. Karie Kirks   CHIEF COMPLAINT:  SOB  BRIEF SUMMARY:   72 year old man with diabetes, chronic atrial fibrillation, thyrotoxicosis.  He was admitted for dyspnea and pulmonary edema, course complicated by encephalopathy and bilatearl subdural hematoma.  He had a reassuring LP.  He required mechanical ventilation for airway protection.  He has had bouts of atrial fibrillation with RVR, hypernatremia.  The evening of 4/24 he required reintubation for overt respiratory distress in the setting of decreased mental status.  There were purulent secretions in the trachea at the time of intubation. 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  10 used to be intubated for airway protection but is tolerating  CPAP with pressure support of only 5.  He is not at all interactive for me and is receiving no sedation.       VITAL SIGNS: BP 118/78 (BP Location: Left Arm)   Pulse 79   Temp 98.6 F (37 C) (Oral)   Resp 20   Ht 5\' 9"  (1.753 m)   Wt 260 lb 5.8 oz (118.1 kg)   SpO2 100%   BMI 38.45 kg/m   HEMODYNAMICS:    VENTILATOR SETTINGS: Vent Mode: PSV;CPAP FiO2 (%):  [30 %] 30 % Set Rate:  [14 bmp] 14 bmp Vt Set:  [570 mL] 570 mL PEEP:  [5 cmH20] 5 cmH20 Pressure Support:  [5 cmH20-15 cmH20] 5 cmH20 Plateau Pressure:  [12 cmH20-16 cmH20] 12 cmH20  INTAKE / OUTPUT: I/O last 3 completed shifts: In: 78 [I.V.:1820; NG/GT:1980; IV Piggyback:100] Out: 2020 [Urine:1485; Stool:535]  PHYSICAL EXAMINATION: General: This is an elderly male who is orally intubated and mechanically ventilated and in no distress.  .     Neuro: Exam is on no sedation.  There is no response to voice, there appears to be purposeful movement of the right upper extremity to noxious stimuli.  He does not open his eyes to sternal rub.  Pupils  are equal.   CV: S1 and S2 are irregularly irregular without murmur rub or gallop.  He has trace anasarca.         PULM: Respirations are unlabored, there is symmetric air movement, there are no wheezes.       GI: The abdomen is obese and soft without any organomegaly masses tenderness or rebound.  There is a functioning right lower quadrant ostomy.    LABS:  BMET Recent Labs  Lab 01/25/18 0441 01/26/18 0440 01/27/18 0224  NA 137 138 141  K 3.7 3.4* 3.6  CL 105 109 109  CO2 25 25 25   BUN 23* 25* 24*  CREATININE 0.82 0.69 0.67  GLUCOSE 194* 246* 226*    Electrolytes Recent Labs  Lab 01/23/18 0430  01/25/18 0441 01/26/18 0440 01/27/18 0224  CALCIUM 9.0   < > 9.0 8.5* 8.8*  MG 1.6*  --   --   --   --    < > = values in this interval not displayed.    CBC Recent Labs  Lab 01/24/18 0309 01/25/18 0441 01/27/18 0224  WBC 9.5 9.1 6.8  HGB 10.0* 9.5* 9.2*  HCT 32.0* 30.5* 29.5*  PLT 71* 73* 73*    Coag's No results for input(s): APTT, INR in the last 168 hours.  Sepsis Markers Recent Labs  Lab 01/21/18 0547 01/22/18 0524  PROCALCITON  0.13 0.29    ABG Recent Labs  Lab 01/21/18 1812 01/23/18 0409 01/25/18 0506  PHART 7.617* 7.479* 7.457*  PCO2ART 27.7* 37.3 33.5  PO2ART 209.0* 139* 106.0    Liver Enzymes Recent Labs  Lab 01/21/18 0547 01/23/18 0430  AST 13* 13*  ALT 68* 43  ALKPHOS 78 65  BILITOT 1.2 1.4*  ALBUMIN 2.0* 1.8*    Cardiac Enzymes Recent Labs  Lab 01/23/18 0445 01/23/18 1105 01/23/18 1550  TROPONINI 0.19* 0.17* 0.16*    Glucose Recent Labs  Lab 01/26/18 0754 01/26/18 1110 01/26/18 1519 01/26/18 2015 01/26/18 2351 01/27/18 0402  GLUCAP 252* 250* 228* 173* 197* 217*    Imaging No results found.   STUDIES:  CT Head 3/31 >> bilateral SDH's, R>L, minimal mass effect without significant midline shift MRI Brain 4/1 >> stable SDH's, trace leftward midline shift US Thyroid 4/4 >> 1.5 cm R TR 4 nodule, 1.4 cm left  inferior T3 nodule EEG 4/7 >> consistent with mild generalized non-specific cerebral dysfunction.  No seizure or seizure predisposition recorded on this study.  ECHO 4/7 >> limited study to evaluate RV, normal LV function, severe bi-atrial enlargement, unable to estimate pulmonary pressure EEG 4/11 >> sedated EEG is abnormal with moderate diffuse background slowing LP 4/15 >> clear fluid, glucose 117, protein 74, RBC 1 CT Head 4/10 >> right hemispheric SDH appears stable, no new areas of ICH, edema or mass effect  CT Head 4/23 >> no acute findings, expected decrease in known R SDH  CULTURES: BCx2 3/29 >> coag neg staph 1/2 bottles  BCx2 4/1 >> negative  Sputum 4/9 >> Klebsiella pneumoniae >> R-ampicillin, otherwise sensitive  BCx2 4/6 >> negative  BCx2 4/9 >> coag neg staph 2/2  CSF 4/15 >> negative  CSF Fungal 4/15 >>   ANTIBIOTICS: Zosyn 3/30>> 4/5, restart 4/6 Vanc 3/31 >> 4/3  SIGNIFICANT EVENTS: 3/29  Admit  4/02  Tx to ICU for resp fx, AMS, AFwRVR, intubated 4/05  Extubated, tx out of ICU 4/06  Returned to ICU, bipap 4/15  LP 4/21  Extubated 4/22  N/V, cortrak placed 4/23  Increased secretions > NTS.  N/V overnight. TF stopped 4/24  Drowsy.   4/24  reintubated  LINES/TUBES: RUE PICC 3/30 >>  ETT 4/2 >> 4/5 ETT 4/9 >> 4/21  DISCUSSION: 72 year old with DM II, chronic atrial fibrillation (previously on anticoagulation), CKD III who was initially admitted with dyspnea on exertion.  He was treated for CHF and found to have thyrotoxicosis which was treated.  He subsequently has had intermittent seizures and depressed mental status.  Found to have bilateral R>L SDH.  MRI with concern for enhancement of the meninges > LP performed.  4/21 much more awake down sedation has been held, extubated.  Mental  status deteriorated on 4/24 requiring reintubation.  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   ASSESSMENT / PLAN:  PULMONARY A: He is intubated solely for airway  protection.  This is day for of his latest round of antibiotics, we are using Rocephin for Klebsiella that has grown from the sputum.      CARDIOVASCULAR A:  Atrial fibrillation.  He is not anticoagulated due to the presence of subdural hematomas.  He is currently rate controlled on a combination of Cardizem and Lopressor.  CHF - on admit, currently not an active issue and considering other clinical circumstances I do not feel compelled to introduce an ACE inhibitor or aggressive diuresis.  RENAL No active issues  GASTROINTESTINAL A: Thrombocytopenia persists, I have switched him from Pepcid to a PPI for GI prophylaxis. MATOLOGIC A:   Anemia  P:  Thrombocytopenia persists even after switching from Pepcid to Protonix for GI prophylaxis.  The next most suspect agent would likely be the Tapazole, but I will not substitute that agent unless his thrombocytopenia worsens.  He is never been on any form of heparin during this hospitalization.  His infection appears to be under control.   SCD's for DVT prophylaxis      INFECTIOUS A:   R/O Aspiration PNA - 4/22, 4/23 with n/v P:   Vancomycin  and Zosyn were initiated following intubation on 4/24, sputum is growing Klebsiella, and antibiotic was therefore narrowed to Rocephin alone on 4/27      ENDOCRINE A:   Thyrotoxicosis  DM with Hyperglycemia    P:   SSI  His free T4 4/30 was only 0.51 which is substantially low, and I  further decreased his dose of Tapazole.  NEUROLOGIC A:   His mental status remains extremely poor.  He has both hypothyroidism and a new right MCA territory CVA accounting for his decreased mental status.  I discussed the situation again with his wife 4/29 who confirmed that he would not wish to be supported only to survive hemiplegic and unable to do the things he enjoys in life.  He was visited by palliative care yesterday and I appreciate their assistance in  helping her cope with this difficult situation.  He has  been made a DNR and wife is contemplating her next decision as to level of care pending input from other family members  Lars Masson, MD Critical Care Pgr: [phone] or if no answer [phone] 01/27/2018, 8:46 AM

## 2018-01-28 ENCOUNTER — Inpatient Hospital Stay (HOSPITAL_COMMUNITY): Payer: Medicare Other

## 2018-01-28 ENCOUNTER — Encounter: Payer: Self-pay | Admitting: Gastroenterology

## 2018-01-28 DIAGNOSIS — L8990 Pressure ulcer of unspecified site, unspecified stage: Secondary | ICD-10-CM

## 2018-01-28 LAB — BASIC METABOLIC PANEL
Anion gap: 6 (ref 5–15)
BUN: 21 mg/dL — AB (ref 6–20)
CHLORIDE: 110 mmol/L (ref 101–111)
CO2: 23 mmol/L (ref 22–32)
CREATININE: 0.59 mg/dL — AB (ref 0.61–1.24)
Calcium: 8.6 mg/dL — ABNORMAL LOW (ref 8.9–10.3)
GFR calc Af Amer: >60 mL/min (ref >60)
GFR calc non Af Amer: >60 mL/min (ref >60)
GLUCOSE: 210 mg/dL — AB (ref 65–99)
POTASSIUM: 4.1 mmol/L (ref 3.5–5.1)
Sodium: 139 mmol/L (ref 135–145)

## 2018-01-28 LAB — GLUCOSE, CAPILLARY
GLUCOSE-CAPILLARY: 246 mg/dL — AB (ref 65–99)
GLUCOSE-CAPILLARY: 202 mg/dL — AB (ref 65–99)
GLUCOSE-CAPILLARY: 246 mg/dL — AB (ref 65–99)
Glucose-Capillary: 210 mg/dL — ABNORMAL HIGH (ref 65–99)
Glucose-Capillary: 221 mg/dL — ABNORMAL HIGH (ref 65–99)
Glucose-Capillary: 203 mg/dL — ABNORMAL HIGH (ref 65–99)

## 2018-01-28 MED ORDER — CHLORHEXIDINE GLUCONATE 0.12 % MT SOLN: 15.0000 mL | Freq: Two times a day (BID) | OROMUCOSAL | Status: DC

## 2018-01-28 NOTE — Plan of Care (Signed)
Pt currently on tube feeds at 55 mL through a cortrak.    Katherine Mantle RN

## 2018-01-28 NOTE — Progress Notes (Signed)
Physical Therapy Treatment Patient Details Name: [name] MRN: [id number] DOB: 12/09/1945 Today's Date: 01/28/2018    History of Present Illness 72 y.o. male with a history of a fib, stage 3 CKD, DM2, adenocarcinoma of colon s/p resection and current colostomy, history of renal cell carcinoma s/p resection.  Pt admitted on 12/16/2017, initially found to have worsening shortness of breath followed by confusion and agitation, was intubated on 4/2 then extubated 4/5, reintubated 4/10 and extubated 4/21, then reintubated 4/24. MRI initially stable right greater than left subdural hematomas since the head CT The right side subdural demonstrates greater lobulation and signal heterogeneity measuring between 8 and 17 mm in thickness. The Stable mild intracranial mass effect with trace leftward midline shift. Normal basilar cisterns.  Thgen on 4/25 MRI showed large R MCA CVA.     PT Comments    Patient progressing minimally with some period of self support of head position to command.  Still lethargic/almost obtunded, but some moments of eye opening, response to command for maybe 10%.  Will need SNF placement depending on goals of care.  PT to follow acutely.   Follow Up Recommendations  SNF;Supervision/Assistance - 24 hour     Equipment Recommendations  Other (comment)(TBA)    Recommendations for Other Services       Precautions / Restrictions Precautions Precautions: Fall Precaution Comments: intubated on CPAP w/PS 5    Mobility  Bed Mobility Overal bed mobility: Needs Assistance Bed Mobility: Rolling;Sidelying to Sit;Sit to Supine Rolling: Total assist;+2 for physical assistance Sidelying to sit: Total assist;+2 for physical assistance   Sit to supine: Total assist;+2 for physical assistance   General bed mobility comments: assist for rolling hips and shoulder and to bring legs off bed and lift trunk.  To supine assist for legs and trunk and scooting to [facility]  Transfers                  General transfer comment: NT for OOB placed bed in chair position  Ambulation/Gait                 Stairs             Wheelchair Mobility    Modified Rankin (Stroke Patients Only) Modified Rankin (Stroke Patients Only) Pre-Morbid Rankin Score: No symptoms Modified Rankin: Severe disability     Balance Overall balance assessment: Needs assistance Sitting-balance support: Feet supported Sitting balance-Leahy Scale: Zero Sitting balance - Comments: pushing to L, opens eyes briefly, supported with total A, ROM for head/neck and pt self supported head briefly to command                                    Cognition Arousal/Alertness: Lethargic Behavior During Therapy: Flat affect Overall Cognitive Status: Difficult to assess                                 General Comments: eyes open briefly when coming up to sit, pushing with R UE to L despite cues and repositioning, able to support head more upright closer to neutral temporarily with cues and handling      Exercises      General Comments        Pertinent Vitals/Pain Faces Pain Scale: Hurts little more Pain Location: with ROM of neck Pain Descriptors / Indicators: Grimacing Pain Intervention(s): Monitored during  session;Limited activity within patient's tolerance;Repositioned    Home Living                      Prior Function            PT Goals (current goals can now be found in the care plan section) Progress towards PT goals: Progressing toward goals(very slow)    Frequency    Min 3X/week      PT Plan Current plan remains appropriate    Co-evaluation              AM-PAC PT "6 Clicks" Daily Activity  Outcome Measure  Difficulty turning over in bed (including adjusting bedclothes, sheets and blankets)?: Unable Difficulty moving from lying on back to sitting on the side of the bed? : Unable Difficulty sitting down on and standing up  from a chair with arms (e.g., wheelchair, bedside commode, etc,.)?: Unable Help needed moving to and from a bed to chair (including a wheelchair)?: Total Help needed walking in hospital room?: Total Help needed climbing 3-5 steps with a railing? : Total 6 Click Score: 6    End of Session Equipment Utilized During Treatment: Other (comment)(vent CPAP ) Activity Tolerance: Patient limited by lethargy Patient left: in bed;with call bell/phone within reach Nurse Communication: Mobility status PT Visit Diagnosis: Other symptoms and signs involving the nervous system (R29.898);Hemiplegia and hemiparesis Hemiplegia - Right/Left: Left Hemiplegia - dominant/non-dominant: Non-dominant Hemiplegia - caused by: Cerebral infarction     Time: 7867-6720 PT Time Calculation (min) (ACUTE ONLY): 25 min  Charges:  $Therapeutic Activity: 23-37 mins                    G CodesMagda Kiel, [place] [phone] 01/28/2018    Reginia Naas 01/28/2018, 10:03 AM

## 2018-01-28 NOTE — Progress Notes (Signed)
PULMONARY / CRITICAL CARE MEDICINE   Name: [name] MRN: [id number] DOB: [birth year]    ADMISSION DATE:  12/10/2017 CONSULTATION DATE:  12/30/17  REFERRING MD:  Dr. Karie Kirks   CHIEF COMPLAINT:  SOB  BRIEF SUMMARY:   71 year old man with diabetes, chronic atrial fibrillation, thyrotoxicosis.  He was admitted for dyspnea and pulmonary edema, course complicated by encephalopathy and bilatearl subdural hematoma.  He had a reassuring LP.  He required mechanical ventilation for airway protection.  He has had bouts of atrial fibrillation with RVR, hypernatremia.  The evening of 4/24 he required reintubation for overt respiratory distress in the setting of decreased mental status.  There were purulent secretions in the trachea at the time of intubation. 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  10 used to be intubated for airway protection but is tolerating  CPAP with pressure support of only 5.  He is not at all interactive for me and is receiving no sedation.       VITAL SIGNS: BP (!) 145/89   Pulse 92   Temp 98 F (36.7 C) (Axillary)   Resp (!) 25   Ht 6\' 1"  (1.854 m)   Wt 261 lb 0.4 oz (118.4 kg)   SpO2 100%   BMI 34.44 kg/m   HEMODYNAMICS:    VENTILATOR SETTINGS: Vent Mode: CPAP;PSV FiO2 (%):  [30 %] 30 % Set Rate:  [14 bmp] 14 bmp Vt Set:  [640 mL] 640 mL PEEP:  [5 cmH20] 5 cmH20 Pressure Support:  [5 cmH20-10 cmH20] 5 cmH20 Plateau Pressure:  [13 cmH20] 13 cmH20  INTAKE / OUTPUT: I/O last 3 completed shifts: In: 4035 [I.V.:1850; NG/GT:2085; IV Piggyback:100] Out: 2105 [Urine:1795; Stool:310]  PHYSICAL EXAMINATION: General: This is an elderly male who is orally intubated and mechanically ventilated and in no distress.  .     Neuro: Exam is on no sedation.  He opens his eyes to voice and orients to voice. Follows commands weakly on R side, but hemiplegic on L side..  CV: S1 and S2 are irregularly irregular without murmur rub or gallop.  He has  trace anasarca.         PULM: Respirations are unlabored, there is symmetric air movement, there are no wheezes.       GI: The abdomen is obese and soft without any organomegaly masses tenderness or rebound.  There is a functioning right lower quadrant ostomy.    LABS:  BMET Recent Labs  Lab 01/26/18 0440 01/27/18 0224 01/28/18 0800  NA 138 141 139  K 3.4* 3.6 4.1  CL 109 109 110  CO2 25 25 23   BUN 25* 24* 21*  CREATININE 0.69 0.67 0.59*  GLUCOSE 246* 226* 210*    Electrolytes Recent Labs  Lab 01/23/18 0430  01/26/18 0440 01/27/18 0224 01/28/18 0800  CALCIUM 9.0   < > 8.5* 8.8* 8.6*  MG 1.6*  --   --   --   --    < > = values in this interval not displayed.    CBC Recent Labs  Lab 01/24/18 0309 01/25/18 0441 01/27/18 0224  WBC 9.5 9.1 6.8  HGB 10.0* 9.5* 9.2*  HCT 32.0* 30.5* 29.5*  PLT 71* 73* 73*    Coag's No results for input(s): APTT, INR in the last 168 hours.  Sepsis Markers Recent Labs  Lab 01/22/18 0524  PROCALCITON 0.29    ABG Recent Labs  Lab 01/21/18 1812 01/23/18 0409 01/25/18 0506  PHART 7.617* 7.479*  7.457*  PCO2ART 27.7* 37.3 33.5  PO2ART 209.0* 139* 106.0    Liver Enzymes Recent Labs  Lab 01/23/18 0430  AST 13*  ALT 43  ALKPHOS 65  BILITOT 1.4*  ALBUMIN 1.8*    Cardiac Enzymes Recent Labs  Lab 01/23/18 0445 01/23/18 1105 01/23/18 1550  TROPONINI 0.19* 0.17* 0.16*    Glucose Recent Labs  Lab 01/27/18 1615 01/27/18 2012 01/27/18 2358 01/28/18 0333 01/28/18 0835 01/28/18 1109  GLUCAP 236* 205* 210* 221* 203* 246*    Imaging No results found.   STUDIES:  CT Head 3/31 >> bilateral SDH's, R>L, minimal mass effect without significant midline shift MRI Brain 4/1 >> stable SDH's, trace leftward midline shift US Thyroid 4/4 >> 1.5 cm R TR 4 nodule, 1.4 cm left inferior T3 nodule EEG 4/7 >> consistent with mild generalized non-specific cerebral dysfunction.  No seizure or seizure predisposition recorded on  this study.  ECHO 4/7 >> limited study to evaluate RV, normal LV function, severe bi-atrial enlargement, unable to estimate pulmonary pressure EEG 4/11 >> sedated EEG is abnormal with moderate diffuse background slowing LP 4/15 >> clear fluid, glucose 117, protein 74, RBC 1 CT Head 4/10 >> right hemispheric SDH appears stable, no new areas of ICH, edema or mass effect  CT Head 4/23 >> no acute findings, expected decrease in known R SDH  CULTURES: BCx2 3/29 >> coag neg staph 1/2 bottles  BCx2 4/1 >> negative  Sputum 4/9 >> Klebsiella pneumoniae >> R-ampicillin, otherwise sensitive  BCx2 4/6 >> negative  BCx2 4/9 >> coag neg staph 2/2  CSF 4/15 >> negative  CSF Fungal 4/15 >>   ANTIBIOTICS: Zosyn 3/30>> 4/5, restart 4/6 Vanc 3/31 >> 4/3  SIGNIFICANT EVENTS: 3/29  Admit  4/02  Tx to ICU for resp fx, AMS, AFwRVR, intubated 4/05  Extubated, tx out of ICU 4/06  Returned to ICU, bipap 4/15  LP 4/21  Extubated 4/22  N/V, cortrak placed 4/23  Increased secretions > NTS.  N/V overnight. TF stopped 4/24  Drowsy.   4/24  reintubated  LINES/TUBES: RUE PICC 3/30 >>  ETT 4/2 >> 4/5 ETT 4/9 >> 4/21  DISCUSSION: 72 year old with DM II, chronic atrial fibrillation (previously on anticoagulation), CKD III who was initially admitted with dyspnea on exertion.  He was treated for CHF and found to have thyrotoxicosis which was treated.  He subsequently has had intermittent seizures and depressed mental status.  Found to have bilateral R>L SDH.  MRI with concern for enhancement of the meninges > LP performed.  4/21 much more awake down sedation has been held, extubated.  Mental  status deteriorated on 4/24 requiring reintubation.  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   ASSESSMENT / PLAN:  PULMONARY A: He is intubated solely for airway protection.  This is day for of his latest round of antibiotics, we are using Rocephin for Klebsiella that has grown from the sputum.     Successful  SBT. Ability to protect airway post extubation questionable.   CARDIOVASCULAR A:  Atrial fibrillation.  He is not anticoagulated due to the presence of subdural hematomas.  He is currently rate controlled on a combination of Cardizem and Lopressor.  CHF - on admit, currently not an active issue and considering other clinical circumstances I do not feel compelled to introduce an ACE inhibitor or aggressive diuresis.        RENAL No active issues  GASTROINTESTINAL A: Thrombocytopenia persists, I have switched him from Pepcid to a  PPI for GI prophylaxis. A:   Anemia  P:  Thrombocytopenia persists even after switching from Pepcid to Protonix for GI prophylaxis.  The next most suspect agent would likely be the Tapazole, but I will not substitute that agent unless his thrombocytopenia worsens.  He is never been on any form of heparin during this hospitalization.  His infection appears to be under control.   SCD's for DVT prophylaxis      INFECTIOUS A:   R/O Aspiration PNA - 4/22, 4/23 with n/v P:   Vancomycin  and Zosyn were initiated following intubation on 4/24, sputum is growing Klebsiella, and antibiotic was therefore narrowed to Rocephin alone on 4/27      ENDOCRINE A:   Thyrotoxicosis  DM with Hyperglycemia    P:   SSI  His free T4 4/30 was only 0.51 which is substantially low, and I  further decreased his dose of Tapazole.  NEUROLOGIC A:   His mental status has improved..  He has both hypothyroidism and a new right MCA territory CVA accounting for his decreased mental status. The situation was discussed again with his wife on 4/29 who confirmed that he would not wish to be supported only to survive hemiplegic and unable to do the things he enjoys in life.  He was visited by palliative care yesterday and I appreciate their assistance in  helping her cope with this difficult situation.  He has been made a DNR and wife is contemplating her next decision as to level of care pending  input from other family members.  Kipp Brood, MD Critical Care Pgr: [phone] or if no answer [phone] 01/28/2018, 4:23 PM

## 2018-01-28 NOTE — Progress Notes (Signed)
Palliative:  [name] continues to wean well on ventilator. No family at bedside. Spoke with RN. He is resting comfortably on ventilator.   I spoke further with wife, [name]. [name] shares that family is arriving Friday afternoon/evening from [place]. Confirms that we will likely be moving forward with one way extubation. We further discussed transition to comfort care and expectations. She had many good questions regarding hospice and comfort care. We did discuss possible transition to hospice after extubation. All questions and concerns addressed. Emotional support provided.   Anticipate likely one way extubation and transition to comfort care Friday or Saturday after family arrives but time has yet to be confirmed by family.   46 min  Vinie Sill, NP Palliative Medicine Team Pager # [phone] (M-F 8a-5p) Team Phone # [phone] (Nights/Weekends)

## 2018-01-29 LAB — GLUCOSE, CAPILLARY
GLUCOSE-CAPILLARY: 224 mg/dL — AB (ref 65–99)
GLUCOSE-CAPILLARY: 232 mg/dL — AB (ref 65–99)
Glucose-Capillary: 209 mg/dL — ABNORMAL HIGH (ref 65–99)
Glucose-Capillary: 218 mg/dL — ABNORMAL HIGH (ref 65–99)
Glucose-Capillary: 237 mg/dL — ABNORMAL HIGH (ref 65–99)
Glucose-Capillary: 213 mg/dL — ABNORMAL HIGH (ref 65–99)

## 2018-01-29 LAB — BASIC METABOLIC PANEL
Anion gap: 6 (ref 5–15)
BUN: 21 mg/dL — AB (ref 6–20)
CHLORIDE: 108 mmol/L (ref 101–111)
CO2: 26 mmol/L (ref 22–32)
CREATININE: 0.62 mg/dL (ref 0.61–1.24)
Calcium: 8.7 mg/dL — ABNORMAL LOW (ref 8.9–10.3)
GFR calc Af Amer: >60 mL/min (ref >60)
GFR calc non Af Amer: >60 mL/min (ref >60)
GLUCOSE: 232 mg/dL — AB (ref 65–99)
POTASSIUM: 3.8 mmol/L (ref 3.5–5.1)
Sodium: 140 mmol/L (ref 135–145)

## 2018-01-29 MED ORDER — INSULIN ASPART 100 UNIT/ML ~~LOC~~ SOLN
6.0000 [IU] | SUBCUTANEOUS | Status: DC
  Administered 2018-01-29 – 2018-01-30 (×6): 6 [IU] via SUBCUTANEOUS

## 2018-01-29 MED ORDER — INSULIN ASPART 100 UNIT/ML ~~LOC~~ SOLN
3.0000 [IU] | SUBCUTANEOUS | Status: DC
  Administered 2018-01-29 (×2): 9 [IU] via SUBCUTANEOUS
  Administered 2018-01-30 (×4): 6 [IU] via SUBCUTANEOUS

## 2018-01-29 MED ORDER — CHLORHEXIDINE GLUCONATE 0.12 % MT SOLN
15.0000 mL | Freq: Two times a day (BID) | OROMUCOSAL | Status: DC
  Administered 2018-01-29: 15 mL via OROMUCOSAL

## 2018-01-29 NOTE — Progress Notes (Signed)
PULMONARY / CRITICAL CARE MEDICINE   Name: SIERRA BISSONETTE MRN: [id number] DOB: Nov 03, 1945    ADMISSION DATE:  12/25/2017 CONSULTATION DATE:  12/30/17  REFERRING MD:  Dr. Karie Kirks   CHIEF COMPLAINT:  SOB  BRIEF SUMMARY:   72 year old man with diabetes, chronic atrial fibrillation, thyrotoxicosis.  He was admitted for dyspnea and pulmonary edema, course complicated by encephalopathy and bilatearl subdural hematoma.  He had a reassuring LP.  He required mechanical ventilation for airway protection.  He has had bouts of atrial fibrillation with RVR, hypernatremia.  The evening of 4/24 he required reintubation for overt respiratory distress in the setting of decreased mental status.  There were purulent secretions in the trachea at the time of intubation. 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  10 used to be intubated for airway protection but is tolerating  CPAP with pressure support of only 5.       VITAL SIGNS: BP (!) 155/85 (BP Location: Left Arm)   Pulse 77   Temp 98.5 F (36.9 C) (Axillary)   Resp (!) 25   Ht 6\' 1"  (1.854 m)   Wt 261 lb 14.5 oz (118.8 kg)   SpO2 100%   BMI 34.55 kg/m   HEMODYNAMICS:  No vasopressor requirements.  VENTILATOR SETTINGS: Vent Mode: PSV;CPAP FiO2 (%):  [30 %] 30 % Set Rate:  [14 bmp] 14 bmp Vt Set:  [640 mL] 640 mL PEEP:  [5 cmH20] 5 cmH20 Pressure Support:  [5 cmH20-10 cmH20] 5 cmH20 Plateau Pressure:  [13 cmH20-14 cmH20] 13 cmH20  INTAKE / OUTPUT: I/O last 3 completed shifts: In: 3930 [I.V.:1800; NG/GT:2030; IV Piggyback:100] Out: 4174 [Urine:1585; Stool:225]  PHYSICAL EXAMINATION: General: This is an elderly male who is orally intubated and mechanically ventilated and in no distress.  .     Neuro: Exam is on no sedation.  He opens his eyes to voice and orients to voice. Follows commands  on R side 3/5 grips and arm flexion., but hemiplegic on L side..  CV: S1 and S2 are irregularly irregular without murmur rub or  gallop.  He has trace anasarca.         PULM: Respirations are unlabored, there is symmetric air movement, there are no wheezes.       GI: The abdomen is obese and soft without any organomegaly masses tenderness or rebound.  There is a functioning right lower quadrant ostomy.    LABS:  BMET Recent Labs  Lab 01/27/18 0224 01/28/18 0800 01/29/18 0257  NA 141 139 140  K 3.6 4.1 3.8  CL 109 110 108  CO2 25 23 26   BUN 24* 21* 21*  CREATININE 0.67 0.59* 0.62  GLUCOSE 226* 210* 232*    Electrolytes Recent Labs  Lab 01/23/18 0430  01/27/18 0224 01/28/18 0800 01/29/18 0257  CALCIUM 9.0   < > 8.8* 8.6* 8.7*  MG 1.6*  --   --   --   --    < > = values in this interval not displayed.    CBC Recent Labs  Lab 01/24/18 0309 01/25/18 0441 01/27/18 0224  WBC 9.5 9.1 6.8  HGB 10.0* 9.5* 9.2*  HCT 32.0* 30.5* 29.5*  PLT 71* 73* 73*    Coag's No results for input(s): APTT, INR in the last 168 hours.  Sepsis Markers No results for input(s): LATICACIDVEN, PROCALCITON, O2SATVEN in the last 168 hours.  ABG Recent Labs  Lab 01/23/18 0409 01/25/18 0506  PHART 7.479* 7.457*  PCO2ART 37.3  33.5  PO2ART 139* 106.0    Liver Enzymes Recent Labs  Lab 01/23/18 0430  AST 13*  ALT 43  ALKPHOS 65  BILITOT 1.4*  ALBUMIN 1.8*    Cardiac Enzymes Recent Labs  Lab 01/23/18 0445 01/23/18 1105 01/23/18 1550  TROPONINI 0.19* 0.17* 0.16*    Glucose Recent Labs  Lab 01/28/18 1648 01/28/18 2013 01/29/18 0001 01/29/18 0355 01/29/18 0712 01/29/18 1109  GLUCAP 202* 246* 232* 209* 218* 237*    Imaging Dg Chest Port 1 View  Result Date: 01/28/2018 CLINICAL DATA:  PICC line placement EXAM: PORTABLE CHEST 1 VIEW COMPARISON:  01/22/2018 FINDINGS: 1724 hours. The cardio pericardial silhouette is enlarged. There is pulmonary vascular congestion without overt pulmonary edema. Left base collapse/consolidation evident. The NG tube passes into the stomach although the distal tip  position is not included on the film. Endotracheal tube tip is 5.7 cm above the base of the carina. Right PICC line tip overlies the right subclavian vein. The visualized bony structures of the thorax are intact. Telemetry leads overlie the chest. IMPRESSION: Right PICC line tip overlies the right subclavian vein. Electronically Signed   By: Misty Stanley M.D.   On: 01/28/2018 19:19     STUDIES:  CT Head 3/31 >> bilateral SDH's, R>L, minimal mass effect without significant midline shift MRI Brain 4/1 >> stable SDH's, trace leftward midline shift US Thyroid 4/4 >> 1.5 cm R TR 4 nodule, 1.4 cm left inferior T3 nodule EEG 4/7 >> consistent with mild generalized non-specific cerebral dysfunction.  No seizure or seizure predisposition recorded on this study.  ECHO 4/7 >> limited study to evaluate RV, normal LV function, severe bi-atrial enlargement, unable to estimate pulmonary pressure EEG 4/11 >> sedated EEG is abnormal with moderate diffuse background slowing LP 4/15 >> clear fluid, glucose 117, protein 74, RBC 1 CT Head 4/10 >> right hemispheric SDH appears stable, no new areas of ICH, edema or mass effect  CT Head 4/23 >> no acute findings, expected decrease in known R SDH  CULTURES: BCx2 3/29 >> coag neg staph 1/2 bottles  BCx2 4/1 >> negative  Sputum 4/9 >> Klebsiella pneumoniae >> R-ampicillin, otherwise sensitive  BCx2 4/6 >> negative  BCx2 4/9 >> coag neg staph 2/2  CSF 4/15 >> negative  CSF Fungal 4/15 >>   ANTIBIOTICS: Zosyn 3/30>> 4/5, restart 4/6 Vanc 3/31 >> 4/3  SIGNIFICANT EVENTS: 3/29  Admit  4/02  Tx to ICU for resp fx, AMS, AFwRVR, intubated 4/05  Extubated, tx out of ICU 4/06  Returned to ICU, bipap 4/15  LP 4/21  Extubated 4/22  N/V, cortrak placed 4/23  Increased secretions > NTS.  N/V overnight. TF stopped 4/24  Drowsy.   4/24  reintubated  LINES/TUBES: RUE PICC 3/30 >>  ETT 4/2 >> 4/5 ETT 4/9 >> 4/21  DISCUSSION: 72 year old with DM II, chronic atrial  fibrillation (previously on anticoagulation), CKD III who was initially admitted with dyspnea on exertion.  He was treated for CHF and found to have thyrotoxicosis which was treated.  He subsequently has had intermittent seizures and depressed mental status.  Found to have bilateral R>L SDH.  MRI with concern for enhancement of the meninges > LP performed.  4/21 much more awake down sedation has been held, extubated.  Mental  status deteriorated on 4/24 requiring reintubation.  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   ASSESSMENT / PLAN:  PULMONARY A: He is intubated solely for airway protection.  This is day for  of his latest round of antibiotics, we are using Rocephin for Klebsiella that has grown from the sputum.     Successful SBT. Ability to protect airway post extubation questionable.   CARDIOVASCULAR A:  Atrial fibrillation.  He is not anticoagulated due to the presence of subdural hematomas.  He is currently rate controlled on a combination of Cardizem and Lopressor. He is edematous and will diurese in anticipation of extubation.  RENAL No active issues  GASTROINTESTINAL A: Thrombocytopenia persists, I have switched him from Pepcid to a PPI for GI prophylaxis. Continue enteral feeds.  HEMATOLOGY Anemia  A: Thrombocytopenia persists even after switching from Pepcid to Protonix for GI prophylaxis.  The next most suspect agent would likely be the Tapazole, but I will not substitute that agent unless his thrombocytopenia worsens.  He is never been on any form of heparin during this hospitalization.  His infection appears to be under control.   SCD's for DVT prophylaxis      INFECTIOUS A:   R/O Aspiration PNA - 4/22, 4/23 with n/v P:   Vancomycin  and Zosyn were initiated following intubation on 4/24, sputum is growing Klebsiella, and antibiotic was therefore narrowed to Rocephin alone on 4/27      ENDOCRINE A:   Thyrotoxicosis  DM with Hyperglycemia    P:   SSI   His free T4 4/30 was only 0.51 which is substantially low, and I  further decreased his dose of Tapazole.  NEUROLOGIC A:   His mental status has improved..  He has both hypothyroidism and a new right MCA territory CVA accounting for his decreased mental status. The situation was discussed again with his wife on 4/29 who confirmed that he would not wish to be supported only to survive hemiplegic and unable to do the things he enjoys in life.  He was visited by palliative care yesterday and I appreciate their assistance in  helping her cope with this difficult situation.  He has been made a DNR and wife is contemplating her next decision as to level of care pending input from other family members.  Plan is for a one-way extubation 5/3.  This patient is critically ill due to respiratory failure requiring mechanical ventilation. He requires frequent reassessment and modifications of therapy to prevent clinical deterioration.  Services include review of chart, lab and radiologic data, multidisciplinary rounding and supervision of all care provided. Total time excluding procedures: 26min   Kipp Brood, MD Critical Care Pgr: [phone] or if no answer [phone] 01/29/2018, 2:44 PM

## 2018-01-29 NOTE — Plan of Care (Signed)
Patient tolerating ventilator, able to wean for several hours today.

## 2018-01-29 NOTE — Progress Notes (Signed)
Nutrition Follow-up  DOCUMENTATION CODES:   Obesity unspecified  INTERVENTION:  Continue Vital High Protein @ 55 ml/hr via Cortrak NGT  30 ml Prostat BID.    Tube feeding regimen provides 1520 kcal (100% of needs), 146 grams of protein, and 1009 ml of H2O.   NUTRITION DIAGNOSIS:   Inadequate oral intake related to acute illness as evidenced by NPO status; ongoing  GOAL:   Provide needs based on ASPEN/SCCM guidelines; not met  MONITOR:   TF tolerance, Labs, Weight trends, Skin  ASSESSMENT:    72 yo male admitted with acute respiratory failure with acute pulmonary edem and possible aspiration pneumonia; pt also with acute metabolic encephalopathy, bilateral subdural hematomas, seizures and thyroid storm. Pt with hx of CKD III, DM, adenocarcinoma of colon s/p resection and colostomy, renal cell carcinoma    Pt discussed during ICU rounds and with RN.    4/2 - 4/4 intubated 4/9 - 4/21 intubated 4/22 Cortrak placed 4/24 re-intubated  Per MD and RN pt now DNR as family revisits [place]  Patient is currently intubated on ventilator support  Labs and medications reviewed.  CBG (last 3)  Recent Labs    01/29/18 0355 01/29/18 0712 01/29/18 1109  GLUCAP 209* 218* 237*    Diet Order:   Diet Order           Diet NPO time specified  Diet effective now          EDUCATION NEEDS:   Not appropriate for education at this time  Skin:  Skin Assessment: (DTI: L ear)  Last BM:  225 ml via colostomy  Height:   Ht Readings from Last 1 Encounters:  01/27/18 '6\' 1"'$  (1.854 m)    Weight:   Wt Readings from Last 1 Encounters:  01/29/18 261 lb 14.5 oz (118.8 kg)    Ideal Body Weight:  72.7 kg  BMI:  Body mass index is 34.55 kg/m.  Estimated Nutritional Needs:   Kcal:  7619-5093  Protein:  >/= 145 grams  Fluid:  Per MD  Maylon Peppers RD, LDN, [place] Pager [phone] After Hours Pager

## 2018-01-29 NOTE — Progress Notes (Signed)
patient Diabetes Program Recommendations  AACE/ADA: New Consensus Statement on Inpatient Glycemic Control (2019)  Target Ranges:  Prepandial:   less than 140 mg/dL      Peak postprandial:   less than 180 mg/dL (1-2 hours)      Critically ill patients:  140 - 180 mg/dL   Results for JEARL, SOTO (MRN [id number]) as of 01/29/2018 09:49  Ref. Range 01/28/2018 08:35 01/28/2018 11:09 01/28/2018 16:48 01/28/2018 20:13 01/29/2018 00:01 01/29/2018 03:55 01/29/2018 07:12  Glucose-Capillary Latest Ref Range: 65 - 99 mg/dL 203 (H) 246 (H) 202 (H) 246 (H) 232 (H) 209 (H) 218 (H)   Review of Glycemic Control  Diabetes history: DM2 Outpatient Diabetes medications: Metformin 1000 mg BID, Actos 45 mg daily Current orders for Inpatient glycemic control: Novolog 0-15 units Q4H, Novolog 3 units Q4H; Vital @ 55 ml/hr  Inpatient Diabetes Program Recommendations:   Glucose consistently above inpatient goal in the 200's. Patient also receiving at least 8 units of Novolog with each insulin administration time.  Please consider increasing tube feeding coverage to Novolog 6 units Q4H.  Thanks, Tama Headings RN, MSN, BC-ADM, [facility] Inpatient Diabetes Coordinator Team Pager [phone] (8a-5p)

## 2018-01-29 NOTE — Progress Notes (Signed)
Palliative:  Rob is weaning on vent but RN calling for for rest mode as he is labored. He has been weaning most of the day. Family not at bedside.   Spoke with Remo Lipps, wife, via phone who shares his brother has arrived today and Rob's daughter is to arrive tomorrow. Plan is for family to gather and then proceed with one way extubation. We have discussed comfort measures with transfer to 6N and transition to hospice care if appropriate. I am guessing that some of this will need to reinforced and discussed with other family as well when they all arrive. Unsure when they plan to extubate but anticipate likely Friday or Saturday.   15 min  Vinie Sill, NP Palliative Medicine Team Pager # [phone] (M-F 8a-5p) Team Phone # [phone] (Nights/Weekends)

## 2018-01-30 DIAGNOSIS — Z515 Encounter for palliative care: Secondary | ICD-10-CM

## 2018-01-30 DIAGNOSIS — Z7189 Other specified counseling: Secondary | ICD-10-CM

## 2018-01-30 LAB — BASIC METABOLIC PANEL
ANION GAP: 6 (ref 5–15)
BUN: 20 mg/dL (ref 6–20)
CALCIUM: 8.8 mg/dL — AB (ref 8.9–10.3)
CO2: 27 mmol/L (ref 22–32)
Chloride: 104 mmol/L (ref 101–111)
Creatinine, Ser: 0.57 mg/dL — ABNORMAL LOW (ref 0.61–1.24)
GFR calc Af Amer: >60 mL/min (ref >60)
GFR calc non Af Amer: >60 mL/min (ref >60)
GLUCOSE: 177 mg/dL — AB (ref 65–99)
Potassium: 3.7 mmol/L (ref 3.5–5.1)
Sodium: 137 mmol/L (ref 135–145)

## 2018-01-30 LAB — GLUCOSE, CAPILLARY
GLUCOSE-CAPILLARY: 178 mg/dL — AB (ref 65–99)
GLUCOSE-CAPILLARY: 176 mg/dL — AB (ref 65–99)
GLUCOSE-CAPILLARY: 167 mg/dL — AB (ref 65–99)
Glucose-Capillary: 192 mg/dL — ABNORMAL HIGH (ref 65–99)

## 2018-01-30 MED ORDER — LORAZEPAM 2 MG/ML IJ SOLN
2.0000 mg | INTRAMUSCULAR | Status: DC | PRN
  Administered 2018-01-31: 2 mg via INTRAVENOUS
  Filled 2018-01-30: qty 1

## 2018-01-30 MED ORDER — ORAL CARE MOUTH RINSE
15.0000 mL | Freq: Two times a day (BID) | OROMUCOSAL | Status: DC
  Administered 2018-01-31: 15 mL via OROMUCOSAL

## 2018-01-30 MED ORDER — GLYCOPYRROLATE 0.2 MG/ML IJ SOLN: 0.2000 mg | INTRAMUSCULAR | Status: DC | PRN

## 2018-01-30 MED ORDER — MIDAZOLAM HCL 2 MG/2ML IJ SOLN
2.0000 mg | Freq: Once | INTRAMUSCULAR | Status: AC
  Administered 2018-01-30: 2 mg via INTRAVENOUS
  Filled 2018-01-30: qty 2

## 2018-01-30 MED ORDER — CHLORHEXIDINE GLUCONATE 0.12% ORAL RINSE (MEDLINE KIT)
15.0000 mL | Freq: Two times a day (BID) | OROMUCOSAL | Status: DC
  Administered 2018-01-30: 15 mL via OROMUCOSAL

## 2018-01-30 MED ORDER — MORPHINE BOLUS VIA INFUSION
2.0000 mg | INTRAVENOUS | Status: DC | PRN
  Administered 2018-01-31: 2 mg via INTRAVENOUS
  Filled 2018-01-30: qty 2

## 2018-01-30 MED ORDER — ORAL CARE MOUTH RINSE
15.0000 mL | OROMUCOSAL | Status: DC
  Administered 2018-01-30 (×5): 15 mL via OROMUCOSAL

## 2018-01-30 MED ORDER — MORPHINE 100MG IN NS 100ML (1MG/ML) PREMIX INFUSION
1.0000 mg/h | INTRAVENOUS | Status: DC
  Administered 2018-01-30: 1 mg/h via INTRAVENOUS
  Filled 2018-01-30: qty 100

## 2018-01-30 MED ORDER — GLYCOPYRROLATE 1 MG PO TABS
1.0000 mg | ORAL_TABLET | ORAL | Status: DC | PRN
  Filled 2018-01-30: qty 1

## 2018-01-30 MED ORDER — MIDAZOLAM HCL 2 MG/2ML IJ SOLN: 2.0000 mg | Freq: Once | INTRAMUSCULAR | Status: DC

## 2018-01-30 NOTE — Progress Notes (Signed)
PT Cancellation/Discharge Note  Patient Details Name: DILLEN BELMONTES MRN: [id number] DOB: [birth year]   Cancelled Treatment:    Reason Eval/Treat Not Completed: Other (comment); noted pt now for comfort measures.  PT signing off.   Reginia Naas 01/30/2018, 3:38 PM  Magda Kiel, [place] 01/30/2018

## 2018-01-30 NOTE — Progress Notes (Signed)
Daily Progress Note   Patient Name: [name]       Date: 01/30/2018 DOB: 1946/09/14  Age: 72 y.o. MRN#: [id number] Attending Physician: Kipp Brood, MD Primary Care Physician: Lemmie Evens, MD Admit Date: 12/08/2017  Reason for Consultation/Follow-up: Establishing goals of care  Subjective: Met with patient's spouse- Remo Lipps and brother in law (who is a respiratory therapist). Remo Lipps is ready to proceed with one way extubation and transition to full comfort measures only. Remo Lipps states that Rob would not want his life prolonged knowing that he would be living in a significantly debilitated state and not be able to do the things he enjoyed. They do not desire any further life prolonging care and desire only comfort measures. They would like extubation at 4pm.  Review of Systems  Unable to perform ROS: Intubated    Length of Stay: 35  Current Medications: Scheduled Meds:  . chlorhexidine gluconate (MEDLINE KIT)  15 mL Mouth Rinse BID  . Chlorhexidine Gluconate Cloth  6 each Topical Daily  . mouth rinse  15 mL Mouth Rinse 10 times per day  . midazolam  2 mg Intravenous Once    Continuous Infusions: . morphine      PRN Meds: [DISCONTINUED] glycopyrrolate **OR** [DISCONTINUED] glycopyrrolate **OR** glycopyrrolate, LORazepam, morphine  Physical Exam  Constitutional: He appears well-nourished. He appears ill.  Cardiovascular: Intact distal pulses.  Pulmonary/Chest: Accessory muscle usage present.  Intubated for aiway support  Neurological:  Does not respond to voice or touch  Skin: Skin is warm. There is pallor.  Nursing note and vitals reviewed.           Vital Signs: BP (!) 158/91 (BP Location: Left Arm)   Pulse 88   Temp 99.6 F (37.6 C) (Axillary)   Resp (!) 24   Ht  _0  (1.854 m)   Wt 118.8 kg (261 lb 14.5 oz)   SpO2 100%   BMI 34.55 kg/m  SpO2: SpO2: 100 % O2 Device: O2 Device: Ventilator O2 Flow Rate: O2 Flow Rate (L/min): 4 L/min  Intake/output summary:   Intake/Output Summary (Last 24 hours) at 01/30/2018 1441 Last data filed at 01/30/2018 1400 Gross per 24 hour  Intake 1370 ml  Output 2650 ml  Net -1280 ml   LBM: Last BM Date: 01/29/18 Baseline Weight: Weight: 129.7 kg (286  lb) Most recent weight: Weight: 118.8 kg (261 lb 14.5 oz)       Palliative Assessment/Data: PPS:    Flowsheet Rows     Most Recent Value  Intake Tab  Referral Department  Hospitalist  Unit at Time of Referral  Med/Surg Unit  Palliative Care Primary Diagnosis  Neurology  Date Notified  01/26/18  Palliative Care Type  Return patient Palliative Care  Reason for referral  Clarify Goals of Care  Date of Admission  12/07/2017  Date first seen by Palliative Care  01/26/18  # of days Palliative referral response time  0 Day(s)  # of days IP prior to Palliative referral  31  Clinical Assessment  Palliative Performance Scale Score  30%  Pain Max last 24 hours  Not able to report  Pain Min Last 24 hours  Not able to report  Dyspnea Max Last 24 Hours  Not able to report  Dyspnea Min Last 24 hours  Not able to report  Nausea Max Last 24 Hours  Not able to report  Nausea Min Last 24 Hours  Not able to report  Anxiety Max Last 24 Hours  Not able to report  Anxiety Min Last 24 Hours  Not able to report  Other Max Last 24 Hours  Not able to report  Psychosocial & Spiritual Assessment  Palliative Care Outcomes  Patient/Family meeting held?  Yes  Who was at the meeting?  wife  Palliative Care follow-up planned  Yes, Facility      Patient Active Problem List   Diagnosis Date Noted  . Pressure injury of skin 01/28/2018  . Ventilator dependence ([place])   . Cerebral embolism with cerebral infarction 01/22/2018  . Acute encephalopathy   . Palliative care encounter   .  Palliative care by specialist   . Hypertensive emergency   . Acute congestive heart failure ([place])   . Acute respiratory failure with hypoxia ([place])   . Acute diastolic (congestive) heart failure ([place])   . Subdural hemorrhage ([place])   . Acute metabolic encephalopathy   . Thyrotoxicosis with thyrotoxic crisis   . Atrial fibrillation with RVR ([place]) 12/20/2017  . History of kidney cancer 11/30/2017  . History of colon cancer 12/20/2017  . Stage 3 chronic kidney disease ([place]) 12/25/2017  . Acquired solitary kidney 12/15/2017  . Supratherapeutic INR 12/19/2017  . Heme positive stool 12/10/2017  . Acute systolic CHF (congestive heart failure) ([place]) 12/20/2017  . Elevated troponin 12/14/2017  . Hypertension   . Diabetes mellitus without complication ([place])   . Atrial fibrillation ([place])   . Chronic anticoagulation     Palliative Care Assessment & Plan   Patient Profile: 72 y.o. male  with past medical history of diabetes type 2, chronic kidney disease stage III history of colon cancer with colostomy in 2010, renal cell carcinoma with right nephrectomy 2010 admitted on 12/21/2017 with weakness, shortness of breath, abnormal INR.  He was initially seen at [facility] and transferred to [facility] for higher level of care. Due to worsening shortness of breath, patient was intubated on 12/30/2017 then extubated on 01/02/2018.  On 01/03/2018 patient developed acute onset of shortness of breath again and was transferred to ICU and placed on BiPAP.   Now intubated for the 3rd time during this admission and MRI shows large R MCA infarct likely resulting from a fib. Chest xray from 5/1 showed pulmonary congestion, left base collapse/consolidation. There was purulent output upon intubation. After multiple conversations with providers and  palliative medicine providers patient's spouse has determined that patient would not want his life prolonged with the knowledge that he would continue to live significantly  debilitated.     Assessment/Recommendations/Plan   One way extubation at [facility] morphine infusion at 94m/hr with 295mbolus every 10 min prn for SOB, please give bolus right before exubation  Robinul .39m16mV every 4hr prn for excessive secretions  Versed 39mg73m once 10 min before extubation  Lorazepam 39mg 15mq4hr prn SOB not controlled with morphine or for anxiety  D/C all medications and interventions not intended for comfort  Goals of Care and Additional Recommendations:  Limitations on Scope of Treatment: Full Comfort Care  Code Status:  DNR  Prognosis:   Hours - Days  Discharge Planning:  Anticipated Hospital Death  Care plan was discussed with patient's spouse and brother in law. Dr. AgarwLynetta Marehank you for allowing the Palliative Medicine Team to assist in the care of this patient.   Time In: 1400 Time Out: 1500 Total Time 60 mins Prolonged Time Billed Yes      Greater than 50%  of this time was spent counseling and coordinating care related to the above assessment and plan.  KasieMariana KaufmanP-C Palliative Medicine   Please contact Palliative Medicine Team phone at [phone] and concerns.

## 2018-01-30 NOTE — Progress Notes (Signed)
PULMONARY / CRITICAL CARE MEDICINE   Name: [name] MRN: [id number] DOB: 1946-06-14    ADMISSION DATE:  12/08/2017 CONSULTATION DATE:  12/30/17  REFERRING MD:  Dr. Karie Kirks   CHIEF COMPLAINT:  SOB  BRIEF SUMMARY:   72 year old man with diabetes, chronic atrial fibrillation, thyrotoxicosis.  He was admitted for dyspnea and pulmonary edema, course complicated by encephalopathy and bilatearl subdural hematoma.  He had a reassuring LP.  He required mechanical ventilation for airway protection.  He has had bouts of atrial fibrillation with RVR, hypernatremia.  The evening of 4/24 he required reintubation for overt respiratory distress in the setting of decreased mental status.  There were purulent secretions in the trachea at the time of intubation. 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  10 used to be intubated for airway protection but is tolerating  CPAP with pressure support of only 5.       VITAL SIGNS: BP (!) 141/85   Pulse 93   Temp 99.6 F (37.6 C) (Axillary)   Resp (!) 23   Ht 6\' 1"  (1.854 m)   Wt 261 lb 14.5 oz (118.8 kg)   SpO2 100%   BMI 34.55 kg/m   HEMODYNAMICS:  No vasopressor requirements.  VENTILATOR SETTINGS: Vent Mode: PSV;CPAP FiO2 (%):  [30 %] 30 % Set Rate:  [14 bmp] 14 bmp Vt Set:  [640 mL] 640 mL PEEP:  [5 cmH20] 5 cmH20 Pressure Support:  [5 cmH20] 5 cmH20 Plateau Pressure:  [14 cmH20-16 cmH20] 14 cmH20  INTAKE / OUTPUT: I/O last 3 completed shifts: In: 3080 [I.V.:1000; NG/GT:1980; IV Piggyback:100] Out: 2900 [Urine:2625; Stool:275]  PHYSICAL EXAMINATION: General: This is an elderly male who is orally intubated and mechanically ventilated and in no distress.  .     Neuro: Exam is on no sedation. Less responsive today. CV: S1 and S2 are irregularly irregular without murmur rub or gallop.  He has trace anasarca.         PULM: Respirations are unlabored, there is symmetric air movement, there are no wheezes.       GI: The  abdomen is obese and soft without any organomegaly masses tenderness or rebound.  There is a functioning right lower quadrant ostomy.    LABS:  BMET Recent Labs  Lab 01/28/18 0800 01/29/18 0257 01/30/18 0450  NA 139 140 137  K 4.1 3.8 3.7  CL 110 108 104  CO2 23 26 27   BUN 21* 21* 20  CREATININE 0.59* 0.62 0.57*  GLUCOSE 210* 232* 177*    Electrolytes Recent Labs  Lab 01/28/18 0800 01/29/18 0257 01/30/18 0450  CALCIUM 8.6* 8.7* 8.8*    CBC Recent Labs  Lab 01/24/18 0309 01/25/18 0441 01/27/18 0224  WBC 9.5 9.1 6.8  HGB 10.0* 9.5* 9.2*  HCT 32.0* 30.5* 29.5*  PLT 71* 73* 73*    Coag's No results for input(s): APTT, INR in the last 168 hours.  Sepsis Markers No results for input(s): LATICACIDVEN, PROCALCITON, O2SATVEN in the last 168 hours.  ABG Recent Labs  Lab 01/25/18 0506  PHART 7.457*  PCO2ART 33.5  PO2ART 106.0    Liver Enzymes No results for input(s): AST, ALT, ALKPHOS, BILITOT, ALBUMIN in the last 168 hours.  Cardiac Enzymes No results for input(s): TROPONINI, PROBNP in the last 168 hours.  Glucose Recent Labs  Lab 01/29/18 1457 01/29/18 2027 01/30/18 0026 01/30/18 0425 01/30/18 0734 01/30/18 1118  GLUCAP 224* 213* 192* 178* 176* 167*    Imaging No  results found.   STUDIES:  CT Head 3/31 >> bilateral SDH's, R>L, minimal mass effect without significant midline shift MRI Brain 4/1 >> stable SDH's, trace leftward midline shift US Thyroid 4/4 >> 1.5 cm R TR 4 nodule, 1.4 cm left inferior T3 nodule EEG 4/7 >> consistent with mild generalized non-specific cerebral dysfunction.  No seizure or seizure predisposition recorded on this study.  ECHO 4/7 >> limited study to evaluate RV, normal LV function, severe bi-atrial enlargement, unable to estimate pulmonary pressure EEG 4/11 >> sedated EEG is abnormal with moderate diffuse background slowing LP 4/15 >> clear fluid, glucose 117, protein 74, RBC 1 CT Head 4/10 >> right hemispheric SDH  appears stable, no new areas of ICH, edema or mass effect  CT Head 4/23 >> no acute findings, expected decrease in known R SDH  CULTURES: BCx2 3/29 >> coag neg staph 1/2 bottles  BCx2 4/1 >> negative  Sputum 4/9 >> Klebsiella pneumoniae >> R-ampicillin, otherwise sensitive  BCx2 4/6 >> negative  BCx2 4/9 >> coag neg staph 2/2  CSF 4/15 >> negative  CSF Fungal 4/15 >>   ANTIBIOTICS: Zosyn 3/30>> 4/5, restart 4/6 Vanc 3/31 >> 4/3  SIGNIFICANT EVENTS: 3/29  Admit  4/02  Tx to ICU for resp fx, AMS, AFwRVR, intubated 4/05  Extubated, tx out of ICU 4/06  Returned to ICU, bipap 4/15  LP 4/21  Extubated 4/22  N/V, cortrak placed 4/23  Increased secretions > NTS.  N/V overnight. TF stopped 4/24  Drowsy.   4/24  reintubated  LINES/TUBES: RUE PICC 3/30 >>  ETT 4/2 >> 4/5 ETT 4/9 >> 4/21  DISCUSSION: 72 year old with DM II, chronic atrial fibrillation (previously on anticoagulation), CKD III who was initially admitted with dyspnea on exertion.  He was treated for CHF and found to have thyrotoxicosis which was treated.  He subsequently has had intermittent seizures and depressed mental status.  Found to have bilateral R>L SDH.  MRI with concern for enhancement of the meninges > LP performed.  4/21 much more awake down sedation has been held, extubated.  Mental  status deteriorated on 4/24 requiring reintubation.  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   ASSESSMENT / PLAN:   He is intubated solely for airway protection. After discussion with wife and brother, there is agreement to proceed with one-way extubation with plan to transition to comfort care.    Kipp Brood, MD Critical Care Pgr: [phone] or if no answer [phone] 01/30/2018, 5:17 PM

## 2018-01-30 NOTE — Procedures (Signed)
Extubation Procedure Note  Patient Details:   Name: [name] DOB: [birth year] MRN: [id number]   Airway Documentation:    Vent end date: 01/30/18 Vent end time: 1615   Evaluation  O2 sats: stable throughout Complications: No apparent complications Patient did tolerate procedure well. Bilateral Breath Sounds: Diminished, Clear   No   Pt extubated to RA per MD order with RN at bedside. No signs of distress or stridor noted.   [name] 01/30/2018, 4:19 PM

## 2018-01-30 NOTE — Progress Notes (Addendum)
OT Cancellation and Discharge  Note  Patient Details Name: [name] MRN: [id number] DOB: 10/10/1945   Cancelled Treatment:    Reason Eval/Treat Not Completed: Medical issues which prohibited therapy.  Per notes, plan is for one way extubation  comfort care. OT will sign off.   [place], OTR/L [phone]   [name] 01/30/2018, 2:25 PM

## 2018-01-31 MED ORDER — MORPHINE 100MG IN NS 100ML (1MG/ML) PREMIX INFUSION
10.0000 mg/h | INTRAVENOUS | Status: DC
  Administered 2018-01-31: 4 mg/h via INTRAVENOUS
  Administered 2018-01-31 – 2018-02-02 (×6): 10 mg/h via INTRAVENOUS
  Filled 2018-01-31 (×9): qty 100

## 2018-01-31 MED ORDER — MORPHINE BOLUS VIA INFUSION
5.0000 mg | INTRAVENOUS | Status: DC | PRN
  Administered 2018-01-31 (×4): 5 mg via INTRAVENOUS
  Administered 2018-02-01: 10 mg via INTRAVENOUS
  Filled 2018-01-31 (×2): qty 20

## 2018-01-31 NOTE — Progress Notes (Signed)
PULMONARY / CRITICAL CARE MEDICINE   Name: [name] MRN: [id number] DOB: 1946-03-27    ADMISSION DATE:  12/03/2017 CONSULTATION DATE:  12/30/17  REFERRING MD:  Dr. Karie Kirks   CHIEF COMPLAINT:  SOB  BRIEF SUMMARY:   72 year old man with diabetes, chronic atrial fibrillation, thyrotoxicosis.  He was admitted for dyspnea and pulmonary edema, course complicated by encephalopathy and bilatearl subdural hematoma.  He had a reassuring LP.  He required mechanical ventilation for airway protection.  He has had bouts of atrial fibrillation with RVR, hypernatremia.  The evening of 4/24 he required reintubation for overt respiratory distress in the setting of decreased mental status.  There were purulent secretions in the trachea at the time of intubation. 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  Patient was terminally extubated on 01/30/2017.  VITAL SIGNS: BP (!) 141/85   Pulse (!) 109   Temp 99.6 F (37.6 C) (Axillary)   Resp 19   Ht 6\' 1"  (1.854 m)   Wt 261 lb 14.5 oz (118.8 kg)   SpO2 (!) 77%   BMI 34.55 kg/m   HEMODYNAMICS:  No vasopressor requirements.   INTAKE / OUTPUT: I/O last 3 completed shifts: In: 533.8 [I.V.:148.8; NG/GT:385] Out: 2025 [Urine:2025]  PHYSICAL EXAMINATION: General: Elderly male on morphine drip no acute distress.     Neuro: Patient is very somnolent does not respond to painful or verbal stimuli CV: S1 and S2 are irregularly irregular without murmur rub or gallop.  He has trace anasarca.         PULM: Respirations are unlabored, there is symmetric air movement, there are no wheezes.       GI: The abdomen is obese and soft without any organomegaly masses tenderness or rebound.  There is a functioning right lower quadrant ostomy.    LABS:  BMET Recent Labs  Lab 01/28/18 0800 01/29/18 0257 01/30/18 0450  NA 139 140 137  K 4.1 3.8 3.7  CL 110 108 104  CO2 23 26 27   BUN 21* 21* 20  CREATININE 0.59* 0.62 0.57*  GLUCOSE 210*  232* 177*    Electrolytes Recent Labs  Lab 01/28/18 0800 01/29/18 0257 01/30/18 0450  CALCIUM 8.6* 8.7* 8.8*    CBC Recent Labs  Lab 01/25/18 0441 01/27/18 0224  WBC 9.1 6.8  HGB 9.5* 9.2*  HCT 30.5* 29.5*  PLT 73* 73*    Coag's No results for input(s): APTT, INR in the last 168 hours.  Sepsis Markers No results for input(s): LATICACIDVEN, PROCALCITON, O2SATVEN in the last 168 hours.  ABG Recent Labs  Lab 01/25/18 0506  PHART 7.457*  PCO2ART 33.5  PO2ART 106.0    Liver Enzymes No results for input(s): AST, ALT, ALKPHOS, BILITOT, ALBUMIN in the last 168 hours.  Cardiac Enzymes No results for input(s): TROPONINI, PROBNP in the last 168 hours.  Glucose Recent Labs  Lab 01/29/18 1457 01/29/18 2027 01/30/18 0026 01/30/18 0425 01/30/18 0734 01/30/18 1118  GLUCAP 224* 213* 192* 178* 176* 167*    Imaging No results found.   STUDIES:  CT Head 3/31 >> bilateral SDH's, R>L, minimal mass effect without significant midline shift MRI Brain 4/1 >> stable SDH's, trace leftward midline shift US Thyroid 4/4 >> 1.5 cm R TR 4 nodule, 1.4 cm left inferior T3 nodule EEG 4/7 >> consistent with mild generalized non-specific cerebral dysfunction.  No seizure or seizure predisposition recorded on this study.  ECHO 4/7 >> limited study to evaluate RV, normal LV function, severe  bi-atrial enlargement, unable to estimate pulmonary pressure EEG 4/11 >> sedated EEG is abnormal with moderate diffuse background slowing LP 4/15 >> clear fluid, glucose 117, protein 74, RBC 1 CT Head 4/10 >> right hemispheric SDH appears stable, no new areas of ICH, edema or mass effect  CT Head 4/23 >> no acute findings, expected decrease in known R SDH  CULTURES: BCx2 3/29 >> coag neg staph 1/2 bottles  BCx2 4/1 >> negative  Sputum 4/9 >> Klebsiella pneumoniae >> R-ampicillin, otherwise sensitive  BCx2 4/6 >> negative  BCx2 4/9 >> coag neg staph 2/2  CSF 4/15 >> negative  CSF Fungal 4/15  >>   ANTIBIOTICS: Zosyn 3/30>> 4/5, restart 4/6 Vanc 3/31 >> 4/3  SIGNIFICANT EVENTS: 3/29  Admit  4/02  Tx to ICU for resp fx, AMS, AFwRVR, intubated 4/05  Extubated, tx out of ICU 4/06  Returned to ICU, bipap 4/15  LP 4/21  Extubated 4/22  N/V, cortrak placed 4/23  Increased secretions > NTS.  N/V overnight. TF stopped 4/24  Drowsy.   4/24  reintubated 5/ 4 terminal extubated  LINES/TUBES: RUE PICC 3/30 >>  ETT 4/2 >> 4/5 ETT 4/9 >> 4/21  DISCUSSION: 72 year old with DM II, chronic atrial fibrillation (previously on anticoagulation), CKD III who was initially admitted with dyspnea on exertion.  He was treated for CHF and found to have thyrotoxicosis which was treated.  He subsequently has had intermittent seizures and depressed mental status.  Found to have bilateral R>L SDH.  MRI with concern for enhancement of the meninges > LP performed.  4/21 much more awake down sedation has been held, extubated.  Mental  status deteriorated on 4/24 requiring reintubation.  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   ASSESSMENT / PLAN:  Patient has been terminally extubated on 01/30/2018.  Patient is on morphine drip for comfort.  He is not in any respiratory distress.  Family is at bedside.    Rojelio Brenner Pulmonary Critical Care Pager: [phone]  01/31/2018, 7:16 PM

## 2018-01-31 NOTE — Progress Notes (Signed)
Palliative Medicine RN Note: Symptom check.  Wife is at bedside. PAINAD 0. Respirations even. Wife reports that she is comfortable with his comfort level. I attempted to engage her in conversations about Rob and his status, but she repeatedly deflected conversation to other topics (to a quilt show, her water bottle, and her planned work schedule for the week).   I washed Rob's face and changed his gown. Mrs Widdowson plans to clip his beard later; she brought an Engineer, maintenance (IT). She also asked about if their dog could visit, as she feels the dog needs to see Rob. I discussed the policy regarding pet visitation, and she will bring vaccination paperwork with her when/if she decides to bring him. Comfort cart was also requested.  Plan for PMT follow up for symptoms again tomorrow.  Marjie Skiff Madisen Ludvigsen, RN, BSN, [facility] Palliative Medicine Team 01/31/2018 1:54 PM Office [phone]

## 2018-02-01 DIAGNOSIS — R0600 Dyspnea, unspecified: Secondary | ICD-10-CM

## 2018-02-01 NOTE — Progress Notes (Signed)
PULMONARY / CRITICAL CARE MEDICINE   Name: [name] MRN: [id number] DOB: 03-21-1946    ADMISSION DATE:  12/16/2017 CONSULTATION DATE:  12/30/17  REFERRING MD:  Dr. Karie Kirks   CHIEF COMPLAINT:  SOB  BRIEF SUMMARY:   72 year old man with diabetes, chronic atrial fibrillation, thyrotoxicosis.  He was admitted for dyspnea and pulmonary edema, course complicated by encephalopathy and bilatearl subdural hematoma.  He had a reassuring LP.  He required mechanical ventilation for airway protection.  He has had bouts of atrial fibrillation with RVR, hypernatremia.  The evening of 4/24 he required reintubation for overt respiratory distress in the setting of decreased mental status.  There were purulent secretions in the trachea at the time of intubation. 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  Patient was terminally extubated on 01/30/2017.  VITAL SIGNS: BP (!) 141/85   Pulse (!) 119   Temp 99.6 F (37.6 C) (Axillary)   Resp (!) 26   Ht 6\' 1"  (1.854 m)   Wt 261 lb 14.5 oz (118.8 kg)   SpO2 (!) 50% Comment: comfort care patient  BMI 34.55 kg/m   HEMODYNAMICS:  No vasopressor requirements.   INTAKE / OUTPUT: I/O last 3 completed shifts: In: 148 [I.V.:148] Out: 1000 [Urine:1000]  PHYSICAL EXAMINATION: General: Elderly male on morphine drip no acute distress.     Neuro: Patient is very somnolent does not respond to painful or verbal stimuli CV: S1 and S2 are irregularly irregular without murmur rub or gallop.  He has trace anasarca.         PULM: Respirations are unlabored, there is symmetric air movement, there are no wheezes.       GI: The abdomen is obese and soft without any organomegaly masses tenderness or rebound.  There is a functioning right lower quadrant ostomy.    LABS:  BMET Recent Labs  Lab 01/28/18 0800 01/29/18 0257 01/30/18 0450  NA 139 140 137  K 4.1 3.8 3.7  CL 110 108 104  CO2 23 26 27   BUN 21* 21* 20  CREATININE 0.59* 0.62  0.57*  GLUCOSE 210* 232* 177*    Electrolytes Recent Labs  Lab 01/28/18 0800 01/29/18 0257 01/30/18 0450  CALCIUM 8.6* 8.7* 8.8*    CBC Recent Labs  Lab 01/27/18 0224  WBC 6.8  HGB 9.2*  HCT 29.5*  PLT 73*    Coag's No results for input(s): APTT, INR in the last 168 hours.  Sepsis Markers No results for input(s): LATICACIDVEN, PROCALCITON, O2SATVEN in the last 168 hours.  ABG No results for input(s): PHART, PCO2ART, PO2ART in the last 168 hours.  Liver Enzymes No results for input(s): AST, ALT, ALKPHOS, BILITOT, ALBUMIN in the last 168 hours.  Cardiac Enzymes No results for input(s): TROPONINI, PROBNP in the last 168 hours.  Glucose Recent Labs  Lab 01/29/18 1457 01/29/18 2027 01/30/18 0026 01/30/18 0425 01/30/18 0734 01/30/18 1118  GLUCAP 224* 213* 192* 178* 176* 167*    Imaging No results found.   STUDIES:  CT Head 3/31 >> bilateral SDH's, R>L, minimal mass effect without significant midline shift MRI Brain 4/1 >> stable SDH's, trace leftward midline shift US Thyroid 4/4 >> 1.5 cm R TR 4 nodule, 1.4 cm left inferior T3 nodule EEG 4/7 >> consistent with mild generalized non-specific cerebral dysfunction.  No seizure or seizure predisposition recorded on this study.  ECHO 4/7 >> limited study to evaluate RV, normal LV function, severe bi-atrial enlargement, unable to estimate pulmonary pressure EEG  4/11 >> sedated EEG is abnormal with moderate diffuse background slowing LP 4/15 >> clear fluid, glucose 117, protein 74, RBC 1 CT Head 4/10 >> right hemispheric SDH appears stable, no new areas of ICH, edema or mass effect  CT Head 4/23 >> no acute findings, expected decrease in known R SDH  CULTURES: BCx2 3/29 >> coag neg staph 1/2 bottles  BCx2 4/1 >> negative  Sputum 4/9 >> Klebsiella pneumoniae >> R-ampicillin, otherwise sensitive  BCx2 4/6 >> negative  BCx2 4/9 >> coag neg staph 2/2  CSF 4/15 >> negative  CSF Fungal 4/15 >>    ANTIBIOTICS: Zosyn 3/30>> 4/5, restart 4/6 Vanc 3/31 >> 4/3  SIGNIFICANT EVENTS: 3/29  Admit  4/02  Tx to ICU for resp fx, AMS, AFwRVR, intubated 4/05  Extubated, tx out of ICU 4/06  Returned to ICU, bipap 4/15  LP 4/21  Extubated 4/22  N/V, cortrak placed 4/23  Increased secretions > NTS.  N/V overnight. TF stopped 4/24  Drowsy.   4/24  reintubated 5/ 4 terminal extubated  LINES/TUBES: RUE PICC 3/30 >>  ETT 4/2 >> 4/5 ETT 4/9 >> 4/21  DISCUSSION: 73 year old with DM II, chronic atrial fibrillation (previously on anticoagulation), CKD III who was initially admitted with dyspnea on exertion.  He was treated for CHF and found to have thyrotoxicosis which was treated.  He subsequently has had intermittent seizures and depressed mental status.  Found to have bilateral R>L SDH.  MRI with concern for enhancement of the meninges > LP performed.  4/21 much more awake down sedation has been held, extubated.  Mental  status deteriorated on 4/24 requiring reintubation.  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   ASSESSMENT / PLAN:  Patient has been terminally extubated on 01/30/2018.  Patient is on morphine drip for comfort.  He is not in any respiratory distress.    Rojelio Brenner Pulmonary Critical Care Pager: [phone]  02/01/2018, 3:40 PM

## 2018-02-01 NOTE — Progress Notes (Signed)
Daily Progress Note   Patient Name: [name]       Date: 02/01/2018 DOB: August 05, 1946  Age: 72 y.o. MRN#: [id number] Attending Physician: Kipp Brood, MD Primary Care Physician: Lemmie Evens, MD Admit Date: 12/24/2017  Reason for Consultation/Follow-up: Terminal Care  Subjective: Patient seen, chart reviewed.  No family at the bedside.  Patient is unresponsive to voice and light touch.  No nonverbal signs and symptoms of pain such as grimacing.  He is currently on a morphine continuous infusion at 10 mg an hour.  Length of Stay: 37  Current Medications: Scheduled Meds:    Continuous Infusions: . morphine 10 mg/hr (02/01/18 1357)    PRN Meds: [DISCONTINUED] glycopyrrolate **OR** [DISCONTINUED] glycopyrrolate **OR** glycopyrrolate, LORazepam, morphine  Physical Exam  Constitutional: He appears well-developed and well-nourished.  Acutely ill-appearing older man.  Unresponsive, trending towards end-of-life  HENT:  Head: Normocephalic and atraumatic.  Cardiovascular: Normal rate.  Pulmonary/Chest:  Brief periods of intermittent apnea lasting 10 seconds.  No upper airway congestion noted  Abdominal: He exhibits distension.  Neurological:  Unresponsive  Skin:  Cool, knees mottled  Psychiatric:  No overt agitation otherwise unable to test  Nursing note and vitals reviewed.           Vital Signs: BP (!) 141/85   Pulse (!) 119   Temp 99.6 F (37.6 C) (Axillary)   Resp (!) 26   Ht 6\' 1"  (1.854 m)   Wt 118.8 kg (261 lb 14.5 oz)   SpO2 (!) 50% Comment: comfort care patient  BMI 34.55 kg/m  SpO2: SpO2: (!) 50 %(comfort care patient) O2 Device: O2 Device: Room Air O2 Flow Rate: O2 Flow Rate (L/min): 4 L/min  Intake/output summary:   Intake/Output Summary (Last 24  hours) at 02/01/2018 1434 Last data filed at 02/01/2018 0800 Gross per 24 hour  Intake 70 ml  Output -  Net 70 ml   LBM: Last BM Date: 01/29/18 Baseline Weight: Weight: 129.7 kg (286 lb) Most recent weight: Weight: 118.8 kg (261 lb 14.5 oz)       Palliative Assessment/Data:    Flowsheet Rows     Most Recent Value  Intake Tab  Referral Department  Hospitalist  Unit at Time of Referral  Med/Surg Unit  Palliative Care Primary Diagnosis  Neurology  Date Notified  01/26/18  Palliative Care Type  Return patient Palliative Care  Reason for referral  Clarify Goals of Care  Date of Admission  12/04/2017  Date first seen by Palliative Care  01/26/18  # of days Palliative referral response time  0 Day(s)  # of days IP prior to Palliative referral  31  Clinical Assessment  Palliative Performance Scale Score  30%  Pain Max last 24 hours  Not able to report  Pain Min Last 24 hours  Not able to report  Dyspnea Max Last 24 Hours  Not able to report  Dyspnea Min Last 24 hours  Not able to report  Nausea Max Last 24 Hours  Not able to report  Nausea Min Last 24 Hours  Not able to report  Anxiety Max Last 24 Hours  Not able to report  Anxiety Min Last 24 Hours  Not able to report  Other Max Last 24 Hours  Not able to report  Psychosocial & Spiritual Assessment  Palliative Care Outcomes  Patient/Family meeting held?  Yes  Who was at the meeting?  wife  Palliative Care follow-up planned  Yes, Facility      Patient Active Problem List   Diagnosis Date Noted  . Advance care planning   . Terminal care   . Goals of care, counseling/discussion   . Pressure injury of skin 01/28/2018  . Ventilator dependence ([place])   . Cerebral embolism with cerebral infarction 01/22/2018  . Acute encephalopathy   . Palliative care encounter   . Palliative care by specialist   . Hypertensive emergency   . Acute congestive heart failure ([place])   . Acute respiratory failure with hypoxia ([place])   . Acute  diastolic (congestive) heart failure ([place])   . Subdural hemorrhage ([place])   . Acute metabolic encephalopathy   . Thyrotoxicosis with thyrotoxic crisis   . Atrial fibrillation with RVR ([place]) 12/28/2017  . History of kidney cancer 12/17/2017  . History of colon cancer 12/27/2017  . Stage 3 chronic kidney disease ([place]) 12/13/2017  . Acquired solitary kidney 12/27/2017  . Supratherapeutic INR 12/07/2017  . Heme positive stool 12/23/2017  . Acute systolic CHF (congestive heart failure) ([place]) 12/18/2017  . Elevated troponin 12/03/2017  . Hypertension   . Diabetes mellitus without complication ([place])   . Atrial fibrillation ([place])   . Chronic anticoagulation     Palliative Care Assessment & Plan   Patient Profile: 72 y.o.malewith past medical history of diabetes type 2, chronic kidney disease stage III history of colon cancer with colostomy in 2010, renal cell carcinoma with right nephrectomy 2010admitted on 3/29/2019with weakness, shortness of breath, abnormal INR. He was initially seen at [facility] and transferred to [facility] for higher level of care. Due to worsening shortness of breath, patient was intubated on 12/30/2017 then extubated on 01/02/2018. On 01/03/2018 patient developed acute onset of shortness of breath again and was transferred to ICU and placed on BiPAP.   Consult ordered for goals of care  Now intubated for the 3rd time during this admission and MRI shows large R MCA infarct likely resulting from a fib. Chest xray from 5/1 showed pulmonary congestion, left base collapse/consolidation. There was purulent output upon intubation.   After multiple conversations with providers and palliative medicine providers patient's spouse has determined that patient would not want his life prolonged with the knowledge that he would continue to live significantly debilitated.     Assessment: Patient appears to be actively dying; anticipate hospital  death  Recommendations/Plan:  Continue morphine continuous infusion.  Monitor and titrate for effect  Continue Robinul PRN; monitor for need for scheduled dosing  Goals of Care and Additional Recommendations:  Limitations on Scope of Treatment: Full Comfort Care  Code Status:    Code Status Orders  (From admission, onward)        Start     Ordered   01/31/18 0238  DNR (Do not attempt resuscitation)  Continuous    Question Answer Comment  In the event of cardiac or respiratory ARREST Do not call a "code blue"   In the event of cardiac or respiratory ARREST Do not perform Intubation, CPR, defibrillation or ACLS   In the event of cardiac or respiratory ARREST Use medication by any route, position, wound care, and other measures to relive pain and suffering. May use oxygen, suction and manual treatment of airway obstruction as needed for comfort.      01/31/18 0237    Code Status History    Date Active Date Inactive Code Status Order ID Comments User Context   01/30/2018 1429 01/31/2018 0237 DNR [id number]  Earlie Counts, NP Inpatient   01/30/2018 1428 01/30/2018 1429 DNR [id number]  Earlie Counts, NP Inpatient   01/26/2018 1119 01/30/2018 1427 DNR [id number]  Pershing Proud, NP Inpatient   12/25/2017 2202 01/26/2018 1119 Full Code [id number]  Truett Mainland, DO ED       Prognosis:   Hours - Days  Discharge Planning:  Anticipated Hospital Death   Thank you for allowing the Palliative Medicine Team to assist in the care of this patient.   Time In: 0800 Time Out: 0825 Total Time 25 min Prolonged Time Billed  no       Greater than 50%  of this time was spent counseling and coordinating care related to the above assessment and plan.  Dory Horn, NP  Please contact Palliative Medicine Team phone at [phone] for questions and concerns.

## 2018-02-01 NOTE — Progress Notes (Signed)
1730 Received pt from ICU via bed. Pt is unresponsive, breathing is irregular. Looks comfortable. On Morphine infusion at 10mg /hr via PICC. No family present upon arrival.

## 2018-02-03 ENCOUNTER — Encounter: Payer: Self-pay | Admitting: Pulmonary Disease

## 2018-02-03 NOTE — Progress Notes (Signed)
Just confirmed with Pharmacy the receipt of the unopened Morphine IV bag returned by the CNA.

## 2018-02-03 NOTE — Progress Notes (Signed)
Pt's spouse and some family members just left [facility], spouse provided funeral home name, CNA just completed body preparation and going down to get the gurney.

## 2018-02-03 NOTE — Progress Notes (Signed)
Patient Placement Sharita notified that body preparation and post mortem checklist are complete and body on the way to the morgue.

## 2018-02-11 LAB — FUNGUS CULTURE WITH STAIN

## 2018-02-11 LAB — FUNGUS CULTURE RESULT

## 2018-02-24 ENCOUNTER — Ambulatory Visit: Payer: Medicare Other | Admitting: Pulmonary Disease

## 2018-02-28 NOTE — Progress Notes (Signed)
Daily Progress Note   Patient Name: [name]       Date: 2018-02-08 DOB: Feb 22, 1946  Age: 72 y.o. MRN#: [id number] Attending Physician: Bonnell Public, MD Primary Care Physician: Lemmie Evens, MD Admit Date: 12/10/2017  Reason for Consultation/Follow-up: Terminal Care  Subjective: Patient actively dying. Appears comfortable. One bolus of 10 mg morphine over the last 24 hrs. Morphine infusion at 10mg /hr. [name]- wife at bedside. She has no questions. She is trying to distract herself with work.  Review of Systems  Unable to perform ROS: Acuity of condition    Length of Stay: 38  Current Medications: Scheduled Meds:    Continuous Infusions: . morphine 10 mg/hr (02/01/18 2332)    PRN Meds: [DISCONTINUED] glycopyrrolate **OR** [DISCONTINUED] glycopyrrolate **OR** glycopyrrolate, LORazepam, morphine  Physical Exam  Constitutional: He appears well-developed and well-nourished.  Actively dying  Cardiovascular:  Diffuse anasarca, unable to detect peripheral pulses  Neurological:  nonresponsive  Skin:  [place] note and vitals reviewed.           Vital Signs: BP (!) 78/34 (BP Location: Left Arm)   Pulse (!) 105   Temp 97.9 F (36.6 C) (Oral)   Resp (!) 23   Ht 6\' 1"  (1.854 m)   Wt 118.8 kg (261 lb 14.5 oz)   SpO2 (!) 78%   BMI 34.55 kg/m  SpO2: SpO2: (!) 78 % O2 Device: O2 Device: Room Air O2 Flow Rate: O2 Flow Rate (L/min): 4 L/min- incorrect- patient has no oxygen in place- is on room air  Intake/output summary:   Intake/Output Summary (Last 24 hours) at 08-Feb-2018 1041 Last data filed at 02/08/2018 0900 Gross per 24 hour  Intake 200 ml  Output 280 ml  Net -80 ml   LBM: Last BM Date: 02/01/18 Baseline Weight: Weight: 129.7 kg (286 lb) Most recent  weight: Weight: 118.8 kg (261 lb 14.5 oz)       Palliative Assessment/Data: PPS: 10%    Flowsheet Rows     Most Recent Value  Intake Tab  Referral Department  Hospitalist  Unit at Time of Referral  Med/Surg Unit  Palliative Care Primary Diagnosis  Neurology  Date Notified  01/26/18  Palliative Care Type  Return patient Palliative Care  Reason for referral  Clarify Goals of Care  Date of  Admission  12/08/2017  Date first seen by Palliative Care  01/26/18  # of days Palliative referral response time  0 Day(s)  # of days IP prior to Palliative referral  31  Clinical Assessment  Palliative Performance Scale Score  30%  Pain Max last 24 hours  Not able to report  Pain Min Last 24 hours  Not able to report  Dyspnea Max Last 24 Hours  Not able to report  Dyspnea Min Last 24 hours  Not able to report  Nausea Max Last 24 Hours  Not able to report  Nausea Min Last 24 Hours  Not able to report  Anxiety Max Last 24 Hours  Not able to report  Anxiety Min Last 24 Hours  Not able to report  Other Max Last 24 Hours  Not able to report  Psychosocial & Spiritual Assessment  Palliative Care Outcomes  Patient/Family meeting held?  Yes  Who was at the meeting?  wife  Palliative Care follow-up planned  Yes, Facility      Patient Active Problem List   Diagnosis Date Noted  . Dyspnea   . Advance care planning   . Terminal care   . Goals of care, counseling/discussion   . Pressure injury of skin 01/28/2018  . Ventilator dependence ([place])   . Cerebral embolism with cerebral infarction 01/22/2018  . Acute encephalopathy   . Palliative care encounter   . Palliative care by specialist   . Hypertensive emergency   . Acute congestive heart failure ([place])   . Acute respiratory failure with hypoxia ([place])   . Acute diastolic (congestive) heart failure ([place])   . Subdural hemorrhage ([place])   . Acute metabolic encephalopathy   . Thyrotoxicosis with thyrotoxic crisis   . Atrial fibrillation with RVR  ([place]) 12/15/2017  . History of kidney cancer 12/12/2017  . History of colon cancer 12/04/2017  . Stage 3 chronic kidney disease ([place]) 12/24/2017  . Acquired solitary kidney 12/10/2017  . Supratherapeutic INR 12/03/2017  . Heme positive stool 12/17/2017  . Acute systolic CHF (congestive heart failure) ([place]) 12/13/2017  . Elevated troponin 12/14/2017  . Hypertension   . Diabetes mellitus without complication ([place])   . Atrial fibrillation ([place])   . Chronic anticoagulation     Palliative Care Assessment & Plan   Patient Profile:  72 y.o.malewith past medical history of diabetes type 2, chronic kidney disease stage III history of colon cancer with colostomy in 2010, renal cell carcinoma with right nephrectomy 2010admitted on 3/29/2019with weakness, shortness of breath, abnormal INR. He was initially seen at [facility] and transferred to [facility] for higher level of care. Due to worsening shortness of breath, patient was intubated on 12/30/2017 then extubated on 01/02/2018. On 01/03/2018 patient developed acute onset of shortness of breath again and was transferred to ICU and placed on BiPAP.  Now intubated for the 3rd time during this admission and MRI shows large R MCA infarct likely resulting from a fib. Chest xray from 5/1 showed pulmonary congestion, left base collapse/consolidation. There was purulent output upon intubation. After multiple conversations with providers and palliative medicine providers patient's spouse has determined that patient would not want his life prolonged with the knowledge that he would continue to live significantly debilitated.      Assessment/Recommendations/Plan   Actively dying- not stable for transfer  Continue current comfort care as ordered  Goals of Care and Additional Recommendations:  Limitations on Scope of Treatment: Full Comfort Care  Code Status:  DNR  Prognosis:   Hours - Days  Discharge Planning:  Anticipated Hospital  Death  Care plan was discussed with patient's spouse.  Thank you for allowing the Palliative Medicine Team to assist in the care of this patient.   Time In: 1020 Time Out: 1045 Total Time 25 mins Prolonged Time Billed NO      Greater than 50%  of this time was spent counseling and coordinating care related to the above assessment and plan.  Mariana Kaufman, AGNP-C Palliative Medicine   Please contact Palliative Medicine Team phone at [phone] for questions and concerns.

## 2018-02-28 NOTE — Care Management Important Message (Signed)
Important Message  Patient Details  Name: [name] MRN: [id number] Date of Birth: 07-Dec-1945   Medicare Important Message Given:  No  Patient is at end of life out of respect MR not given  Orbie Pyo Mar 01, 2018, 11:54 AM

## 2018-02-28 NOTE — Progress Notes (Signed)
Nutrition Brief Note  Chart reviewed. Pt now transitioning to comfort care.  No further nutrition interventions warranted at this time.  Please re-consult as needed.   Somya Jauregui A. Allister Lessley, RD, LDN, CDE Pager: [phone] After hours Pager: [phone]  

## 2018-02-28 NOTE — Progress Notes (Signed)
Still awaiting for pt's wife arrival to [facility], Floor coverage [facility] called back and will come to the floor and issue death certificate.  Charge RN made aware.

## 2018-02-28 NOTE — Progress Notes (Signed)
BRIEF SUMMARY:   72 year old man with diabetes, chronic atrial fibrillation, thyrotoxicosis. He was admitted for dyspnea and pulmonary edema, course complicated by encephalopathy and bilatearl subdural hematoma. He had a reassuring LP. He required mechanical ventilation for airway protection. He has had bouts of atrial fibrillation with RVR, hypernatremia.  The evening of 4/24 he required reintubation for overt respiratory distress in the setting of decreased mental status.  There were purulent secretions in the trachea at the time of intubation. 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  Patient was terminally extubated on 01/30/2017.  02/13/2018: Patient seen alongside patient's nurse and patient's wife.  Patient is comfort measures only.  Patient is comfortable.  Continue current management.

## 2018-02-28 NOTE — Progress Notes (Addendum)
Patient expired around 2100 and confirmed by L. Dionicia Abler, charge RN, 10 ml Morphine IV wasted in sink with RN Abiera, removed IV site and foley catheter, spouse Wilmont Olund was informed,  will be coming to [facility] to pay her last rspect and get pt's belongings, no funeral parlor identified yet, Palliative, [facility] and PCCM MDs text paged regarding pt's death and awaiting for reply, and [place] [facility] was also made and referral number obtained, not a potential organ donor and cleared for release.  Awaiting arrival of pt.'s spouse.

## 2018-02-28 NOTE — Discharge Summary (Signed)
Date discharge summary:  Patient was looked after by the ICU team, terminally extubated, and transferred to the hospitalist team.  This is my first contact with the patient.  The patient has already been made comfort care only.  The patient is comfortable, not able to communicate, and was on morphine when I saw him.  Patient was seen alongside patient's wife and nurse.  In summary, patient was a 72 year old man with diabetes, chronic atrial fibrillation, thyrotoxicosis. He was admitted for dyspnea and pulmonary edema, course complicated by encephalopathy and bilatearl subdural hematoma. He had a reassuring LP. He required mechanical ventilation for airway protection. He has had bouts of atrial fibrillation with RVR, hypernatremia.  The evening of 4/24 he required reintubation for overt respiratory distress in the setting of decreased mental status. There were purulent secretions in the trachea at the time of intubation. 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  Patient was terminally extubated on 01/30/2017.  02/05/2018: Patient passed away peacefully.

## 2018-02-28 NOTE — Progress Notes (Signed)
Just called Vonna Drafts, pt's wife and confirmed that they will be arriving by 15-20 minutes in [facility].

## 2018-02-28 NOTE — Consult Note (Addendum)
[place] Nurse wound follow up Wound type:DTI to left ear, stable.   RLQ colsotomy Measurement: 1 cm x 0.3 cm intact scabbed lesion Wound LMR:AJHH Drainage (amount, consistency, odor) none Periwound:intact Dressing procedure/placement/frequency:Open to air at this time Wife at bedside.  Discussed that we are watching this and it is resolving.  She is appreciative. [place] Nurse ostomy follow up Stoma type/location: RLQ colostomy, long standing Education provided: Pouch is intact and supplies in room.  No needs at this t time.  Enrolled patient in [place] Start Discharge program: No   WOC team will follow.  Domenic Moras RN BSN [place] Pager [phone]

## 2018-02-28 DEATH — deceased

## 2018-09-11 ENCOUNTER — Ambulatory Visit (HOSPITAL_COMMUNITY): Payer: Medicare Other | Admitting: Hematology

## 2018-09-11 ENCOUNTER — Other Ambulatory Visit (HOSPITAL_COMMUNITY): Payer: Medicare Other

## 2018-12-30 IMAGING — MR MR MRA HEAD W/O CM
10 of 13 series · 28 of 48 positions shown · IV contrast (Yes MH)
Comparison: CT HEAD January 07, 2018 and MRI of the head December 29, 2017

CLINICAL DATA: Follow-up evaluation. Encephalopathic. On
anticoagulation for atrial fibrillation. History of colon cancer,
hypertension, diabetes.

EXAM:
MRI HEAD WITHOUT AND WITH CONTRAST
MRA HEAD WITHOUT CONTRAST
TECHNIQUE: Multiplanar, multiecho pulse sequences of the brain and surrounding
structures were obtained without and with intravenous contrast.
Angiographic images of the head were obtained using MRA technique
without contrast.
CONTRAST:  20mL MULTIHANCE GADOBENATE DIMEGLUMINE 529 MG/ML IV SOLN

[Series 3: DWI · axial · 3.0mm · 0.94mm/px · z∈[-69,+77]mm · 6 of 100 slices shown (1 of 2)]
[im 1/100]
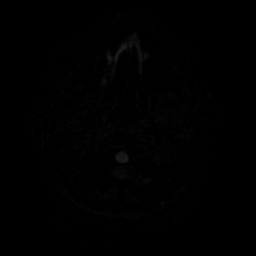
[im 20/100]
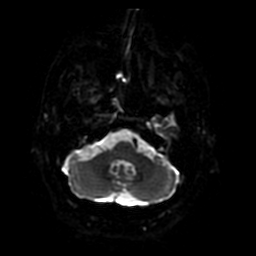
[im 40/100]
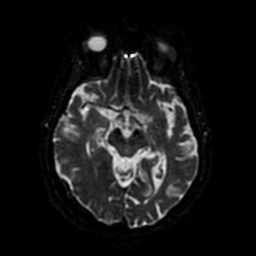
[im 60/100]
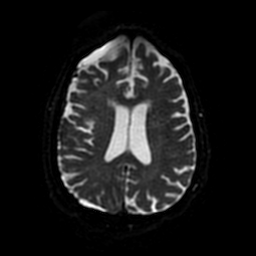
[im 80/100]
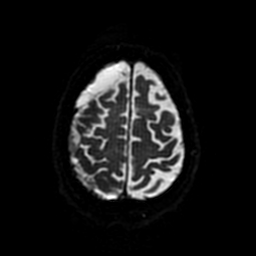
[im 100/100]
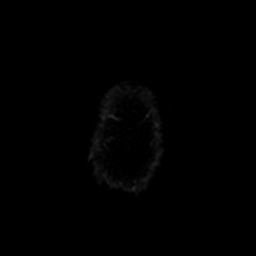

[Series 4: ax (id) 2 · axial · 1.0mm · 0.43mm/px · z∈[-59,-22]mm · 4 of 188 slices shown]
[im 1/188]
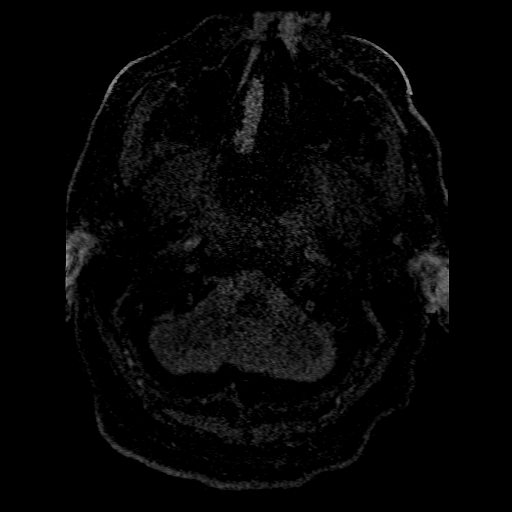
[im 38/188]
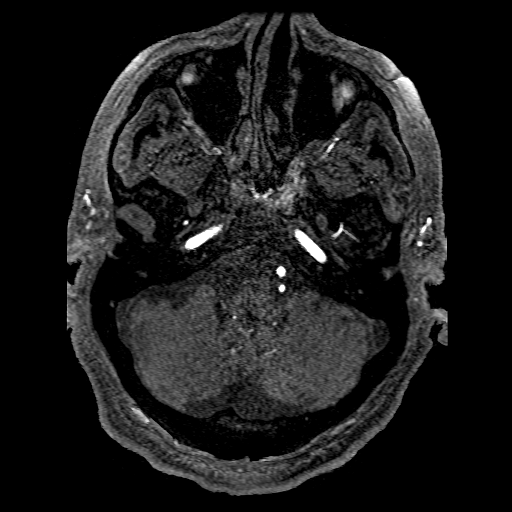
[im 57/188]
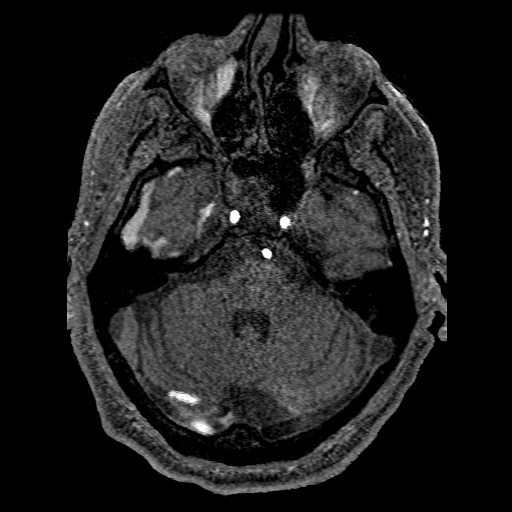
[im 75/188]
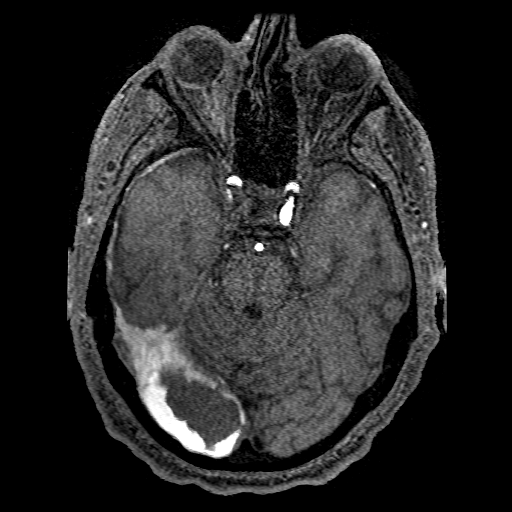

[Series 5: DWI · coronal · 4.0mm · 0.94mm/px · 4 of 71 slices shown (2 of 2)]
[im 1/71]
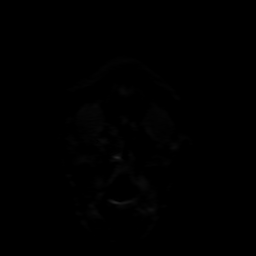
[im 24/71]
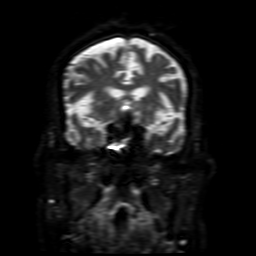
[im 47/71]
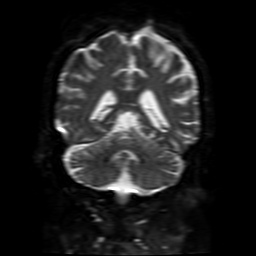
[im 71/71]
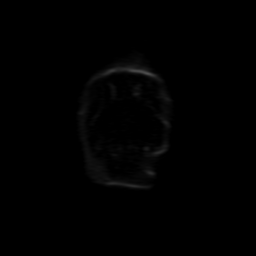

[Series 6: FLAIR · sagittal · 5.0mm · 0.47mm/px · 1 of 25 slices shown (1 of 2)]
[im 1/25]
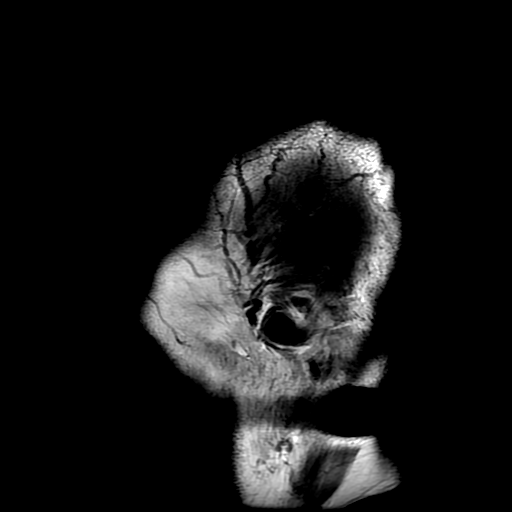

[Series 7: T2 · axial · 5.0mm · 0.43mm/px · z∈[-73,+88]mm · 2 of 28 slices shown]
[im 1/28]
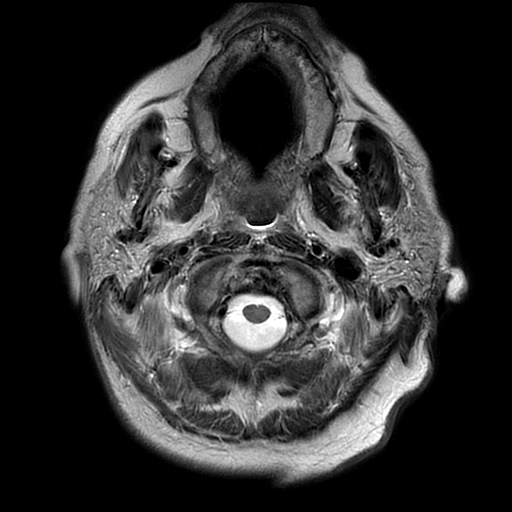
[im 28/28]
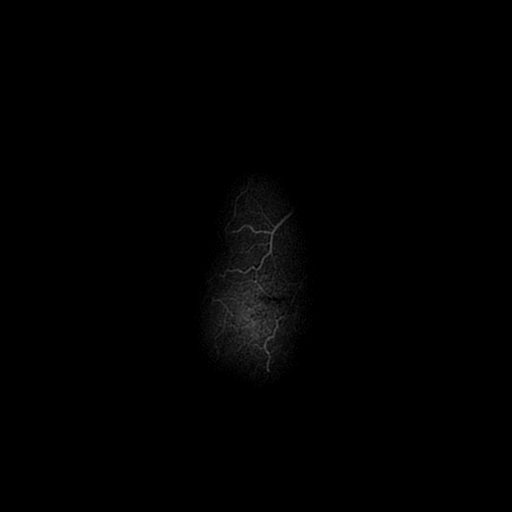

[Series 8: FLAIR · axial · 3.0mm · 0.43mm/px · z∈[-73,+88]mm · 2 of 28 slices shown (2 of 2)]
[im 1/28]
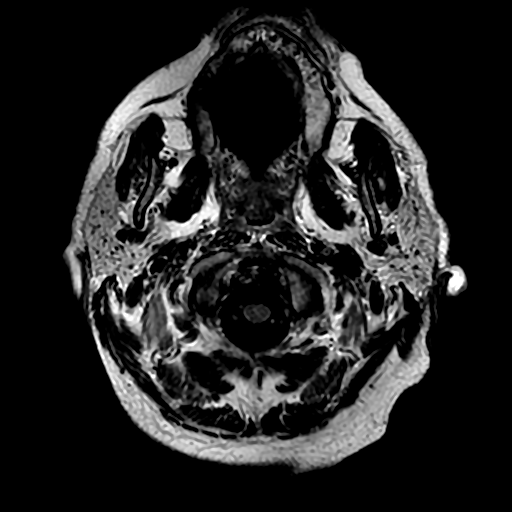
[im 28/28]
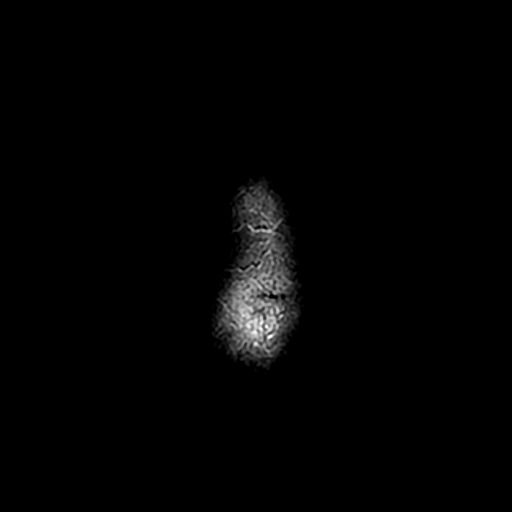

[Series 11: T2 post-contrast · coronal · 5.0mm · 0.39mm/px · 2 of 30 slices shown]
[im 1/30]
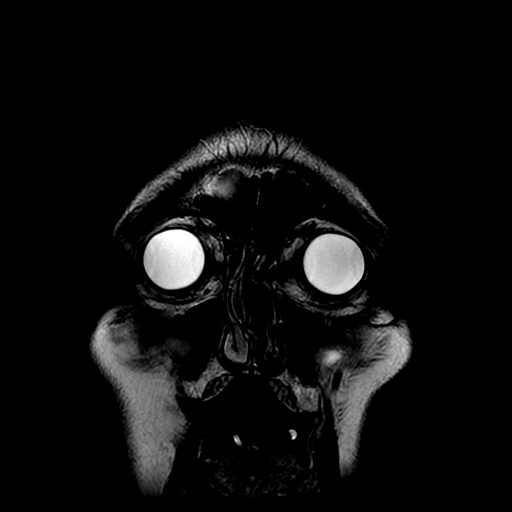
[im 30/30]
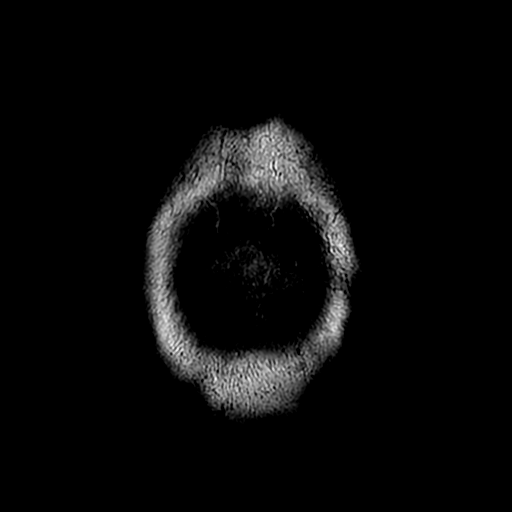

[Series 13: T1 · coronal · 5.0mm · 0.39mm/px · 2 of 30 slices shown]
[im 1/30]
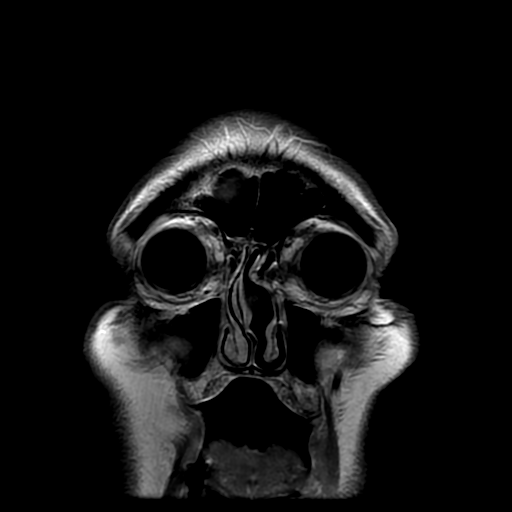
[im 30/30]
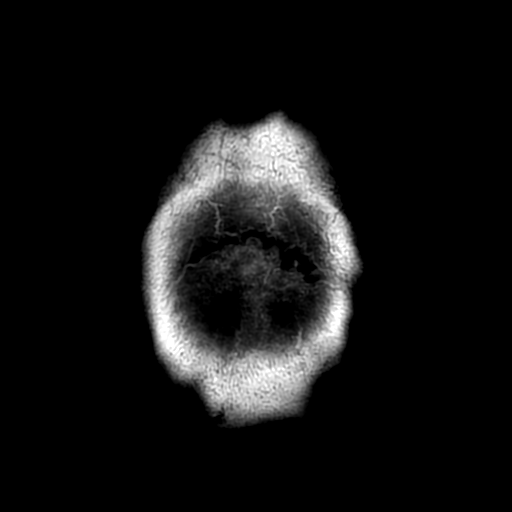

[Series 350: ADC · axial · 3.0mm · 0.94mm/px · z∈[-69,+77]mm · 3 of 50 slices shown (1 of 2)]
[im 1/50]
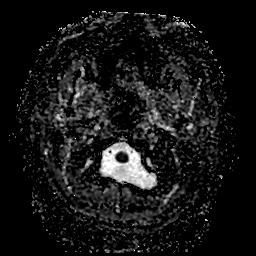
[im 25/50]
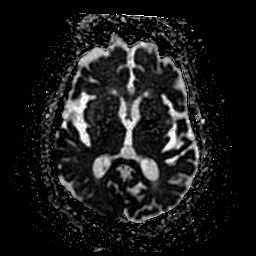
[im 50/50]
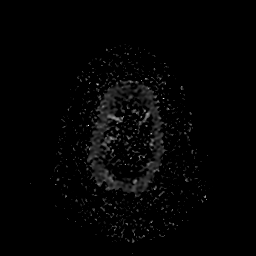

[Series 550: ADC · coronal · 4.0mm · 0.94mm/px · 2 of 36 slices shown (2 of 2)]
[im 1/36]
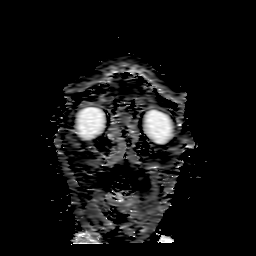
[im 36/36]
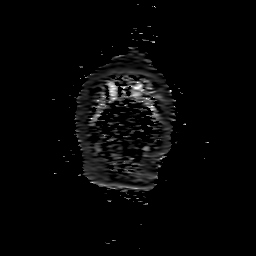

[28 of 48 positions shown; findings below may reference images not displayed]

FINDINGS: MRI HEAD FINDINGS

INTRACRANIAL CONTENTS: Relatively similar holohemispheric RIGHT
subdural hematoma measuring to 12 mm with dependent T1 shortening.
Mild similar mass effect on RIGHT frontal lobe. No significant
midline shift. Near complete dissolution of LEFT subdural hematoma.
Trace residual falcotentorial subdural hematoma. Trace RIGHT
subarachnoid hemorrhage versus stagnant cortical veins. Mild smooth
dural enhancement. Patchy supratentorial white matter FLAIR T2
hyperintensities. Prominent punctate T2 hyperintensities in the
basal ganglia associated with chronic small vessel ischemic disease.
Moderate parenchymal brain volume loss. No hydrocephalus. No reduced
diffusion to suggest acute ischemia. No abnormal parenchymal
enhancement.

VASCULAR: Normal major intracranial vascular flow voids present at
skull base.

SKULL AND UPPER CERVICAL SPINE: No abnormal sellar expansion. No
suspicious calvarial bone marrow signal. Craniocervical junction
maintained.

SINUSES/ORBITS: Trace mastoid effusions. Severe RIGHT sphenoid
sinusitis.The included ocular globes and orbital contents are
non-suspicious.

OTHER: Patient is edentulous.  Life-support lines in place.

MRA HEAD FINDINGS

ANTERIOR CIRCULATION: Normal flow related enhancement of the
included cervical, petrous, cavernous and supraclinoid internal
carotid arteries. Elongated, fenestrated anterior communicating
artery. Patent anterior and middle cerebral arteries, including
distal segments.

No large vessel occlusion, flow limiting stenosis, aneurysm.

POSTERIOR CIRCULATION: vertebral artery is dominant. Basilar artery
is patent, with normal flow related enhancement of the main branch
vessels. Patent posterior cerebral arteries. RIGHT PCOM origin
infundibulum.

No large vessel occlusion, flow limiting stenosis,  aneurysm.

ANATOMIC VARIANTS: None.

Source images and MIP images were reviewed.
IMPRESSION: MRI head:

1. Similar acute to subacute RIGHT holohemispheric subdural
hematoma. Trace residual falcotentorial subdural hematoma. Near
complete resolution of LEFT subdural hematoma.
2. Moderate parenchymal brain volume loss and mild chronic small
vessel ischemic disease.

MRA head:

1. Negative noncontrast MRA head.

## 2019-01-01 IMAGING — RF DG FLUORO GUIDE LUMBAR PUNCTURE
2 series · 2 of 2 positions shown · non-contrast
Comparison: none

CLINICAL DATA: Altered mental status.

[Series 1: cp_standard · 0.26mm/px · 1 of 1 slices shown (1 of 2)]
[im 1/1]
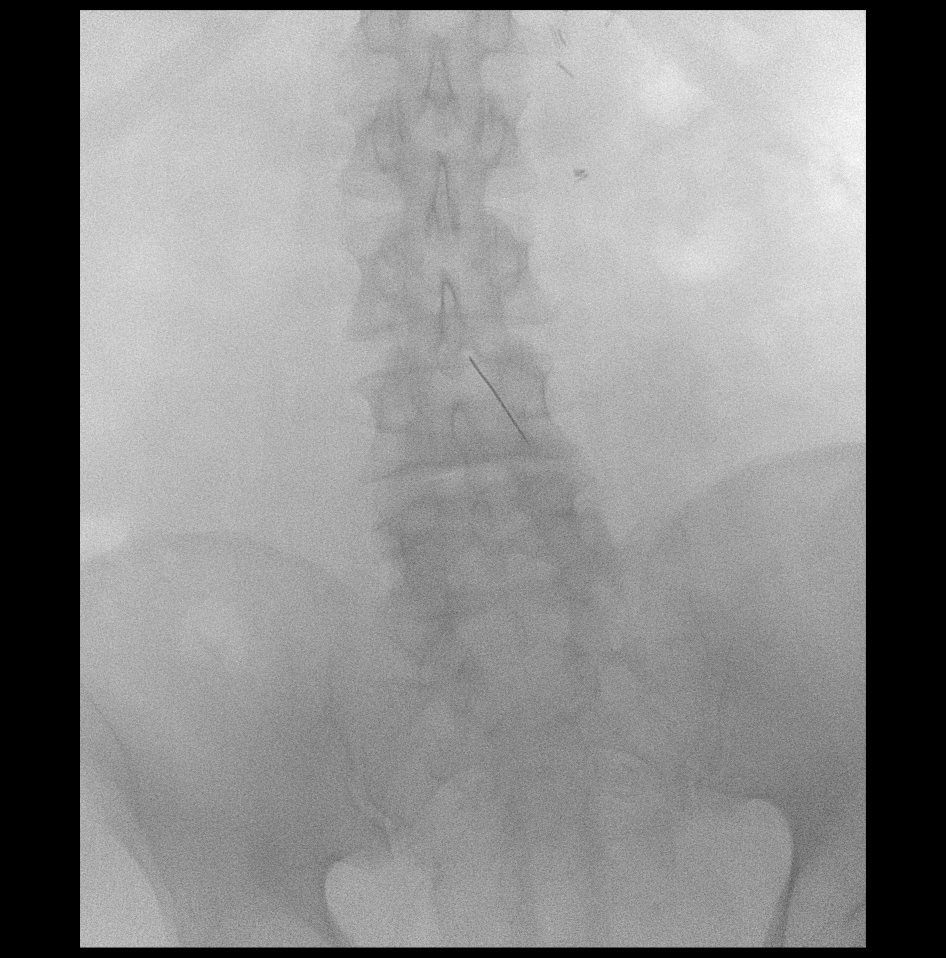

[Series 2: cp_standard · 0.26mm/px · 1 of 1 slices shown (2 of 2)]
[im 1/1]
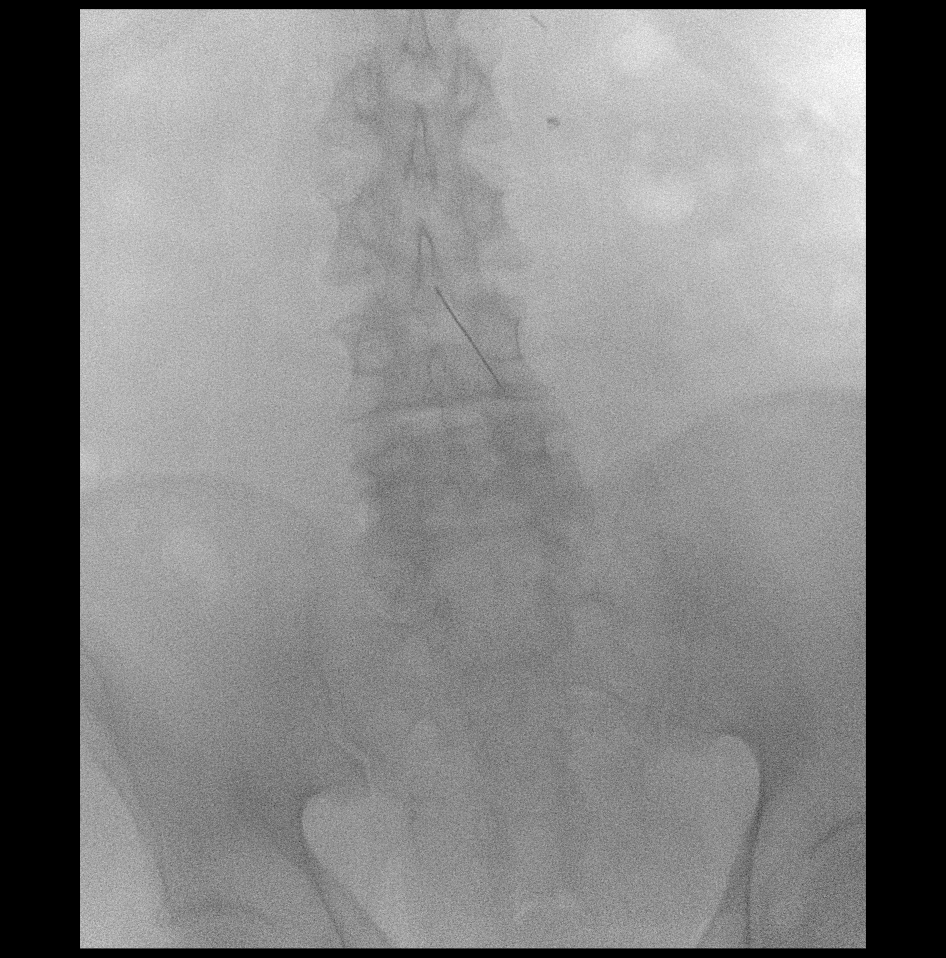

[2 of 2 positions shown; findings below may reference images not displayed]

EXAM:
DIAGNOSTIC LUMBAR PUNCTURE UNDER FLUOROSCOPIC GUIDANCE

FLUOROSCOPY TIME:  Fluoroscopy Time:  18 seconds

PROCEDURE:
Informed consent was obtained from the patient's wife by phone prior
to the procedure, including potential complications of headache,
allergy, and pain. With the patient prone, the lower back was
prepped with Betadine. 1% Lidocaine was used for local anesthesia.
Lumbar puncture was performed at the L3-4 level using a 21 gauge
needle with return of clear colorless CSF with an opening pressure
of 25 cm water. Twenty-two ml of CSF were obtained for laboratory
studies. The patient tolerated the procedure well and there were no
apparent complications.
IMPRESSION: Diagnostic lumbar puncture performed without complication.

## 2019-01-02 IMAGING — CR DG CHEST 1V PORT
1 series · 1 of 1 positions shown · non-contrast
Comparison: 01/08/2018

CLINICAL DATA: Shortness of breath

EXAM:
PORTABLE CHEST 1 VIEW

[AP]
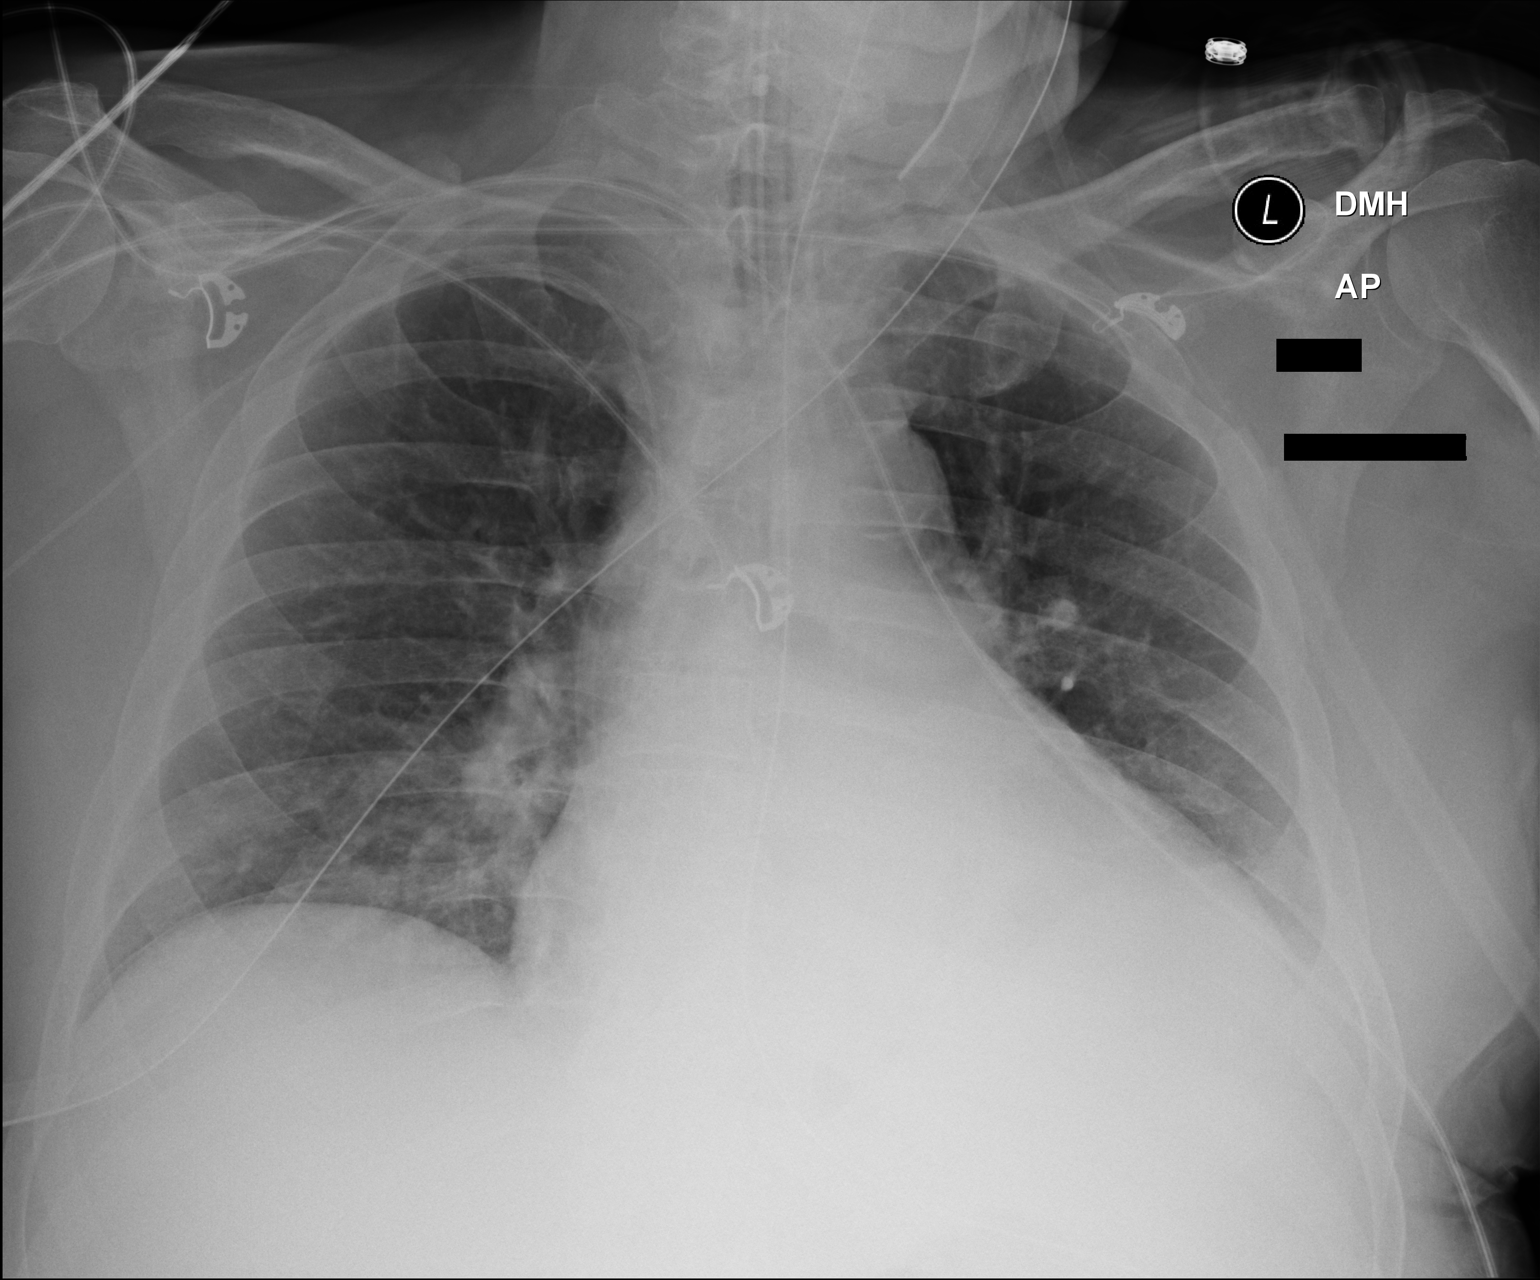

[1 of 1 positions shown; findings below may reference images not displayed]

FINDINGS: Support devices are stable. Cardiomegaly. Bilateral lower lobe
airspace opacities, left greater than right, new since prior study.
Suspect small left effusion.
IMPRESSION: Support devices are stable.

Increasing bilateral lower lobe atelectasis or infiltrates, left
greater than right. Suspect layering left effusion.
